# Patient Record
Sex: Male | Born: 1970 | Race: Black or African American | Hispanic: No | Marital: Single | Smoking: Never smoker
Health system: Southern US, Community
[De-identification: ages and names within clinical notes are randomized; demographics above are authoritative.]

## PROBLEM LIST (undated history)

## (undated) DIAGNOSIS — I10 Essential (primary) hypertension: Secondary | ICD-10-CM

## (undated) DIAGNOSIS — F32A Depression, unspecified: Secondary | ICD-10-CM

## (undated) DIAGNOSIS — K8020 Calculus of gallbladder without cholecystitis without obstruction: Secondary | ICD-10-CM

## (undated) DIAGNOSIS — G4730 Sleep apnea, unspecified: Secondary | ICD-10-CM

## (undated) DIAGNOSIS — M1990 Unspecified osteoarthritis, unspecified site: Secondary | ICD-10-CM

## (undated) DIAGNOSIS — F329 Major depressive disorder, single episode, unspecified: Secondary | ICD-10-CM

## (undated) DIAGNOSIS — E119 Type 2 diabetes mellitus without complications: Secondary | ICD-10-CM

## (undated) DIAGNOSIS — I82409 Acute embolism and thrombosis of unspecified deep veins of unspecified lower extremity: Secondary | ICD-10-CM

## (undated) DIAGNOSIS — K219 Gastro-esophageal reflux disease without esophagitis: Secondary | ICD-10-CM

## (undated) DIAGNOSIS — D571 Sickle-cell disease without crisis: Secondary | ICD-10-CM

## (undated) HISTORY — PX: LIGAMENT REPAIR: SHX5444

---

## 1999-11-27 ENCOUNTER — Emergency Department (HOSPITAL_COMMUNITY): Admission: EM | Admit: 1999-11-27 | Discharge: 1999-11-27 | Payer: Self-pay | Admitting: Emergency Medicine

## 2001-02-07 ENCOUNTER — Emergency Department (HOSPITAL_COMMUNITY): Admission: EM | Admit: 2001-02-07 | Discharge: 2001-02-07 | Payer: Self-pay | Admitting: Emergency Medicine

## 2001-02-15 ENCOUNTER — Emergency Department (HOSPITAL_COMMUNITY): Admission: EM | Admit: 2001-02-15 | Discharge: 2001-02-15 | Payer: Self-pay | Admitting: Emergency Medicine

## 2001-09-06 ENCOUNTER — Emergency Department (HOSPITAL_COMMUNITY): Admission: EM | Admit: 2001-09-06 | Discharge: 2001-09-06 | Payer: Self-pay | Admitting: Emergency Medicine

## 2003-04-02 ENCOUNTER — Emergency Department (HOSPITAL_COMMUNITY): Admission: EM | Admit: 2003-04-02 | Discharge: 2003-04-02 | Payer: Self-pay | Admitting: Emergency Medicine

## 2004-10-10 ENCOUNTER — Emergency Department (HOSPITAL_COMMUNITY): Admission: EM | Admit: 2004-10-10 | Discharge: 2004-10-10 | Payer: Self-pay | Admitting: Emergency Medicine

## 2006-01-29 ENCOUNTER — Ambulatory Visit: Payer: Self-pay | Admitting: *Deleted

## 2006-01-29 ENCOUNTER — Ambulatory Visit: Payer: Self-pay | Admitting: Family Medicine

## 2006-09-02 ENCOUNTER — Emergency Department (HOSPITAL_COMMUNITY): Admission: EM | Admit: 2006-09-02 | Discharge: 2006-09-02 | Payer: Self-pay | Admitting: Emergency Medicine

## 2007-01-07 ENCOUNTER — Emergency Department (HOSPITAL_COMMUNITY): Admission: EM | Admit: 2007-01-07 | Discharge: 2007-01-07 | Payer: Self-pay | Admitting: Emergency Medicine

## 2007-01-13 ENCOUNTER — Ambulatory Visit (HOSPITAL_COMMUNITY): Admission: RE | Admit: 2007-01-13 | Discharge: 2007-01-13 | Payer: Self-pay | Admitting: Orthopedic Surgery

## 2007-11-22 ENCOUNTER — Emergency Department (HOSPITAL_COMMUNITY): Admission: EM | Admit: 2007-11-22 | Discharge: 2007-11-22 | Payer: Self-pay | Admitting: Emergency Medicine

## 2009-07-05 ENCOUNTER — Emergency Department (HOSPITAL_COMMUNITY): Admission: EM | Admit: 2009-07-05 | Discharge: 2009-07-05 | Payer: Self-pay | Admitting: Family Medicine

## 2009-09-23 ENCOUNTER — Emergency Department (HOSPITAL_COMMUNITY): Admission: EM | Admit: 2009-09-23 | Discharge: 2009-09-23 | Payer: Self-pay | Admitting: Family Medicine

## 2010-04-10 ENCOUNTER — Encounter: Payer: Self-pay | Admitting: *Deleted

## 2010-05-18 ENCOUNTER — Emergency Department (HOSPITAL_COMMUNITY): Admission: EM | Admit: 2010-05-18 | Discharge: 2010-05-18 | Payer: Self-pay | Admitting: Family Medicine

## 2010-10-12 ENCOUNTER — Ambulatory Visit
Admission: RE | Admit: 2010-10-12 | Discharge: 2010-10-12 | Payer: Self-pay | Source: Home / Self Care | Attending: Family Medicine | Admitting: Family Medicine

## 2010-10-12 ENCOUNTER — Other Ambulatory Visit: Payer: Self-pay

## 2010-10-12 ENCOUNTER — Encounter: Payer: Self-pay | Admitting: Family Medicine

## 2010-10-12 DIAGNOSIS — I10 Essential (primary) hypertension: Secondary | ICD-10-CM | POA: Insufficient documentation

## 2010-10-12 DIAGNOSIS — R000 Tachycardia, unspecified: Secondary | ICD-10-CM | POA: Insufficient documentation

## 2010-10-12 LAB — CONVERTED CEMR LAB
BUN: 13 mg/dL (ref 6–23)
CO2: 27 meq/L (ref 19–32)
Chloride: 101 meq/L (ref 96–112)
Creatinine, Ser: 1.05 mg/dL (ref 0.40–1.50)
Glucose, Bld: 91 mg/dL (ref 70–99)

## 2010-10-17 NOTE — Miscellaneous (Signed)
Summary: Do Not Reschedule  Pt missed NP appt.  Per [facility] policy is not allowed to reschedule.  Dennison Nancy RN  April 10, 2010 4:34 PM

## 2010-10-19 ENCOUNTER — Encounter: Payer: Self-pay | Admitting: Family Medicine

## 2010-10-19 ENCOUNTER — Ambulatory Visit (INDEPENDENT_AMBULATORY_CARE_PROVIDER_SITE_OTHER): Payer: Medicaid Other | Admitting: Family Medicine

## 2010-10-19 DIAGNOSIS — R030 Elevated blood-pressure reading, without diagnosis of hypertension: Secondary | ICD-10-CM

## 2010-10-19 DIAGNOSIS — R000 Tachycardia, unspecified: Secondary | ICD-10-CM

## 2010-10-19 DIAGNOSIS — R609 Edema, unspecified: Secondary | ICD-10-CM

## 2010-10-19 NOTE — Assessment & Plan Note (Signed)
Summary: new pt/elevated bp/wife is a pt/bmc   Vital Signs:  Patient profile:   40 year old male Height:      69 inches Weight:      254.9 pounds BMI:     37.78 Temp:     99.0 degrees F oral Pulse rate:   84 / minute BP sitting:   144 / 88  (left arm) Cuff size:   large  Vitals Entered By: David Dunlap (October 12, 2010 9:17 AM)  CC: New Patient  Is Patient Diabetic? No Pain Assessment Patient in pain? no        CC:  New Patient .  History of Present Illness: Pt concerns:  1. Elevated pulse: x 2 weeks. Pt told pulse was elevated at a visit to donate plasma. Was not told that BP was high in the past. Denies palpations, CP, SOB, visual changes. Gained 10 lbs over last 7 mos. Denies urinary changes. Occasional swelling of lower extremities, b/l, one day r side, o ther day L side. Swelling on and off for years.   2. ? Diabetes- Denies polyuria, denies increased thirst. Denies consistently increased fatigue. Denies excessive sweating. 10 lbs weight gain over the last 7 mos.   3. R ankle knot- present for 6 mos. Trauma to R ankle in 1994. Told he has a hairline. Every now and then R ankle swells and is painful. When this happens he soaks it in warm water and props it up. Swelling and pain last for 1 week. No identified inciting factors.     Preventive Screening-Counseling & Management  Alcohol-Tobacco     Smoking Status: quit  Past History:  Past Medical History: None No hospitalizations.   Past Surgical History: R arm surgery-ligament repair. Following accident with washing machine- 2008.  Family History: Family History Diabetes 1st degree relative- mother, father (resolved).  Family History Hypertension-mother, sister   denies fam hx of cancers/stroke  Social History: Graduated from high school. [employer] for [employer].  Occupation: unemployed x 2 weeks. 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  Prev smoker-  quit  in 1988. (smoked for oone year, 1 pack/year). ETOH: none.  IDU: denies present/past.  Exercise: walks daily to run errands.  2 dogs, 3 cats.   Religion: attends church.  Former Smoker Smoking Status:  quit Occupation:  employed  Review of Systems       The patient complains of headaches.  The patient denies anorexia, fever, vision loss, decreased hearing, hoarseness, chest pain, syncope, dyspnea on exertion, prolonged cough, abdominal pain, melena, hematochezia, severe indigestion/heartburn, hematuria, muscle weakness, suspicious skin lesions, transient blindness, difficulty walking, depression, and unusual weight change.    Physical Exam  General:  Well-developed,well-nourished,in no acute distress; alert,appropriate and cooperative throughout examination Eyes:  No corneal or conjunctival inflammation noted. EOMI. Perrla. Vision grossly normal. Mouth:  pharynx pink and moist and fair dentition.   Neck:  supple, full ROM, and no masses.   Lungs:  normal respiratory effort and normal breath sounds.   Heart:  normal rate, regular rhythm, and no murmur.   Abdomen:  soft, non-tender, no distention, no masses, and no guarding.   Rectal:  no external abnormalities, no hemorrhoids, normal sphincter tone, and no masses.   Prostate:  no gland enlargement, no nodules, and no asymmetry.   Msk:  normal ROM, no joint tenderness, no joint swelling, and no joint warmth.   Extremities:  No edema Neurologic:  No  cranial nerve deficits noted. Station and gait are normal. Sensory, motor and coordinative functions appear intact. Skin:  Well-healed scar R forearm.  Additional Exam:  EKG- Sinus rhythm, short PR interval, LVH, widened QRS, pulse 84.     Impression & Recommendations:  Problem # 1:  ELEVATED BLOOD PRESSURE WITHOUT DIAGNOSIS OF HYPERTENSION (ICD-796.2) A: BP initially elevated. Normal when rechecked. No history of elevated BPs. No syncope, CP, SOB, peripheral  edema.  P: Recheck BP in 1-2 weeks. Baseline Cr to assess renal function.  Orders: EKG- [facility] (EKG) Basic Met-[facility] (40981-19147)  Problem # 2:  Hx of UNSPECIFIED TACHYCARDIA (ICD-785.0) Pulse WNL now. TSH to assess for 2/2 cause of elevated pulse.   Orders: EKG- [facility] (EKG) TSH-[facility] ([id number])  Problem # 3:  FAMILY HISTORY DIABETES 1ST DEGREE RELATIVE (ICD-V18.0) ROS negative. Pt reports 10 lbs weight gain over last 7 mos since his wife got pregnant. Advised weight loss. Will check BMET for random blood sugar.   Other Orders: Flu Vaccine 73yrs + (65784) Admin 1st Vaccine (69629)  Patient Instructions: 1)  David Dunlap, 2)  Thank you for coming in today. 3)  It was a pleasure meeting you.  4)  Given your family history it is important for you to have a primary doctor.  5)  Regarding high pulse/BP, your pulse is normal today. BP high, then normal. I will get a baseline EKG and baseline labs.  6)  Please return in 1 week for repeat BP check (nurse visit). 7)  Screenings- at age 51 we will screen for prostate cancer (we can discuss PSA vs. repeat rectal exam) and we will screen for colon cancer with colonoscopy vs. stool blood studies.  8)  f/u  9)  - with me in 1-2 weeks-recheck BP and pulse and review labs.  10)  -Dr. Armen Dunlap   Orders Added: 1)  EKG- [facility] [EKG] 2)  Basic Met-[facility] [52841-32440] 3)  TSH-[facility] [10272-53664] 4)  Flu Vaccine 24yrs + [90658] 5)  Admin 1st Vaccine [90471] 6)  [facility]- New Level 4 [99204]   Immunizations Administered:  Influenza Vaccine # 1:    Vaccine Type: Fluvax 3+    Site: right deltoid    Mfr: GlaxoSmithKline    Dose: 0.5 ml    Route: IM    Given by: David Dunlap    Exp. Date: 03/17/2011    Lot #: [id number]    VIS given: 04/11/10 version given October 12, 2010.  Flu Vaccine Consent Questions:    Do you have a history of severe allergic reactions to this vaccine? no    Any prior history of allergic reactions to egg and/or  gelatin? no    Do you have a sensitivity to the preservative Thimersol? no    Do you have a past history of Guillan-Barre Syndrome? no    Do you currently have an acute febrile illness? no    Have you ever had a severe reaction to latex? no    Vaccine information given and explained to patient? yes   Immunizations Administered:  Influenza Vaccine # 1:    Vaccine Type: Fluvax 3+    Site: right deltoid    Mfr: GlaxoSmithKline    Dose: 0.5 ml    Route: IM    Given by: David Dunlap    Exp. Date: 03/17/2011    Lot #: [id number]    VIS given: 04/11/10 version given October 12, 2010.

## 2010-10-25 NOTE — Letter (Signed)
Summary: Generic Letter  [facility]  [address]   [place], [place] [address]   Phone: [phone]  Fax: [phone]    10/19/2010  To whom it may concern at biolife plama  The below mentioned patient:  David Dunlap [address] [place] [address] [place], [place]  [address]  [place]   Has been medically cleared to donate plasma. His BP is normal as well as his pulse. His work-up for essential HTN is also normal.   Sincerely,   Dessa Phi MD

## 2010-10-25 NOTE — Letter (Signed)
Summary: Generic Letter  [facility]  [address]   [place], [place] [address]   Phone: [phone]  Fax: [phone]    10/19/2010  TAIM WURM [address] [place] [address] [place], [place]  [address]  [place]  Dear Mr. Daye,           Sincerely,   Dessa Phi MD

## 2010-10-25 NOTE — Assessment & Plan Note (Signed)
Summary: F/U  David Dunlap   Vital Signs:  Patient profile:   40 year old male Height:      69 inches Weight:      256.3 pounds BMI:     37.99 Temp:     99.0 degrees F oral Pulse rate:   99 / minute BP sitting:   125 / 80  (left arm) Cuff size:   large  Vitals Entered By: Garen Grams LPN (October 19, 2010 2:02 PM) CC: f/u bp Is Patient Diabetic? No Pain Assessment Patient in pain? no        CC:  f/u bp.  History of Present Illness: Pt here accompanied by wife ([redacted] weeks pregnant, HR OB) to f/u recent elevation in BP and HR.  1. High BPs- normal BP today. Labs WNL. Pt denies CP, HA, SOB. Needed to be medically cleared to sell plasma at [facility] on [address].   2. R Ankle pain/swelling chronic- pt states that his ankle became swollen after our last visit. Mildly painful. Denies recent trauma. Had an accident on the job in 1992/1993. Since then his ankles will sometimes swell and become tender. Gait is normal. Pain is tolerable. Pt has not tried anything for pain relief.   Preventive Screening-Counseling & Management  Alcohol-Tobacco     Smoking Status: quit  Social History: Graduated from high school. [employer] for [employer].  Occupation: unemployed x 2 weeks. Previously Pharmacist, community). Married. Wife pregant and due in early May 2012.  5 children, one on the way- all boys.  Prev smoker- quit  in 1988. (smoked for oone year, 1 pack/year). ETOH: none.  IDU: denies present/past.  Exercise: walks daily to run errands.  2 dogs, 3 cats.   Religion: attends church.  Former Smoker  Review of Systems       As per HPI  Physical Exam  General:  Well-developed,well-nourished,in no acute distress; alert,appropriate and cooperative throughout examination Msk:  R ankle swelling, medial malleolus, stiff. Mildly tender. no joint warmth, no joint deformities, no joint instability, and no crepitation.  no joint warmth, no joint deformities, no joint instability, and  no crepitation.     Impression & Recommendations:  Problem # 1:  ELEVATED BLOOD PRESSURE WITHOUT DIAGNOSIS OF HYPERTENSION (ICD-796.2)  Normal BP today. Reviewed labs (Cr and TSH) normal.  HTN resolved: Will clear pt to sell plasma.   Orders: [facility]- Est Level  3 ([address])  Problem # 2:  Hx of UNSPECIFIED TACHYCARDIA (ICD-785.0)  Pulse high normal, Normal TSH.  Orders: [facility]- Est Level  3 ([address])  Problem # 3:  ANKLE EDEMA (ICD-782.3)  R ankle swelling, Midly tender Possible arthritic changes following injury. Advise pt to schedule Acetominophen or Ibuprofen esp when he plans to be active. Will obtain records from urgent- ankle last imaged 7 mos ago. Consider re-imaging and joint injection.   Orders: [facility]- Est Level  3 ([address])   Orders Added: 1)  [facility]- Est Level  3 [[address]]

## 2010-11-22 ENCOUNTER — Ambulatory Visit (INDEPENDENT_AMBULATORY_CARE_PROVIDER_SITE_OTHER): Payer: Medicaid Other | Admitting: Family Medicine

## 2010-11-22 ENCOUNTER — Encounter: Payer: Self-pay | Admitting: Family Medicine

## 2010-11-22 DIAGNOSIS — K5900 Constipation, unspecified: Secondary | ICD-10-CM | POA: Insufficient documentation

## 2010-11-22 MED ORDER — POLYETHYLENE GLYCOL 3350 17 GM/SCOOP PO POWD: 17.0000 g | Freq: Every day | ORAL | Status: AC

## 2010-11-22 NOTE — Patient Instructions (Addendum)
It looks like some of the mucosa is irritated but there are not obvious hemorrhoids internally or externally.  I suggest finding a good bowel regimen to have one soft stool daily.  Eat lost of fruits and veggies in order to get lots of fiber in your diet.  Also, try using 1 capful of Miralax a day for soft stools.

## 2010-11-22 NOTE — Assessment & Plan Note (Signed)
Assess for hemorrhoids but non visualized. Suspect the blood on the toilet was caused by mucosal irritation from constipation. Plan to treat with Miralax to soften stool. Pt can return if having further symptoms of bleeding.

## 2010-11-22 NOTE — Progress Notes (Signed)
  Subjective:    Patient ID: David Dunlap, male    DOB: [birth year], 40 y.o.   MRN: [id number]  HPI Pt comes in with blood in the stool for the last 2 days. He says he is constipated sometimes (not always, not even once a week) but has been straining recently. He had a prostate exam some time in Jan and noticed blood in his stool a few days after that as well. Otherwise, he has not noticed blood in his stool. It is blood only on the tissue when he wipes. He does not have blood on the stool or in the bowl of water. He has no pain. He has not felt any bulging. He tries not to sit a long time when he stools but mentions that he does occasionally have to strain.    Review of Systems Neg except as noted in HPI    Objective:   Physical Exam  Constitutional: He appears well-developed and well-nourished.  Genitourinary: Rectum normal.       Exam with anoscope done. No signs of hemorroids either external nor internal. Mucosa appears red/purple but no bleeding noted.           Assessment & Plan:

## 2010-11-30 LAB — POCT I-STAT, CHEM 8
Calcium, Ion: 1.19 mmol/L (ref 1.12–1.32)
Creatinine, Ser: 1.2 mg/dL (ref 0.4–1.5)
Glucose, Bld: 87 mg/dL (ref 70–99)
HCT: 49 % (ref 39.0–52.0)
Hemoglobin: 16.7 g/dL (ref 13.0–17.0)
Potassium: 4.2 mEq/L (ref 3.5–5.1)
Sodium: 138 mEq/L (ref 135–145)
TCO2: 29 mmol/L (ref 0–100)

## 2010-12-21 LAB — COMPREHENSIVE METABOLIC PANEL
BUN: 13 mg/dL (ref 6–23)
CO2: 27 mEq/L (ref 19–32)
Calcium: 9.2 mg/dL (ref 8.4–10.5)
GFR calc non Af Amer: >60 mL/min (ref >60)
Glucose, Bld: 114 mg/dL — ABNORMAL HIGH (ref 70–99)
Total Protein: 6.9 g/dL (ref 6.0–8.3)

## 2011-02-02 NOTE — Op Note (Signed)
David Dunlap, David Dunlap            ACCOUNT NO.:  [id number]   MEDICAL RECORD NO.:  [id number]          PATIENT TYPE:  AMB   LOCATION:  SDS                          FACILITY:  [facility]   PHYSICIAN:  Nadara Mustard, MD     DATE OF BIRTH:  [birth year]   DATE OF PROCEDURE:  01/13/2007  DATE OF DISCHARGE:                               OPERATIVE REPORT   PREOPERATIVE DIAGNOSIS:  Ulnar nerve laceration, right wrist.   POSTOPERATIVE DIAGNOSES:  1. Ulnar nerve laceration, right wrist.  2. Laceration flexor carpi ulnaris.  3. Laceration of flexor digitorum superficialis, right little finger.   PROCEDURE:  1. Repair ulnar nerve.  2. Repair flexor carpi ulnaris.  3. Repair flexor digitorum superficialis to the little finger.   SURGEON:  Nadara Mustard, MD   ANESTHESIA:  General plus infraclavicular block.   SPECIMENS:  None.   DRAINS:  None.   COMPLICATIONS:  None.   ANTIBIOTICS:  1 gram of Kefzol.   TOURNIQUET TIME:  Esmarch at the forearm for approximately 33 minutes.   DISPOSITION:  To PACU in stable condition.   INDICATIONS FOR PROCEDURE:  The patient is a 40 year old gentleman who  fell over a coffee table sustaining a laceration to the ulnar nerve and  ulnar artery.  The patient was seen in the office.  The wound was  irrigated cleansed.  The ulnar nerve was tagged and the patient presents  at this time for definitive surgical intervention.  Risks and benefits  of surgery were discussed including infection, neurovascular injury,  failure of the nerve repair, potential for additional injury at the  wrist, need for additional surgery.  The patient states he understands  and wished proceed at this time.   DESCRIPTION OF PROCEDURE:  The patient was brought to OR room one after  undergoing an infraclavicular block.  The patient underwent a test and  he still had some sensation and then he underwent general, anesthetic.  After adequate level of anesthesia obtained the patient's  right upper  extremity was prepped using DuraPrep and draped into a sterile field.  The traumatic laceration was extended proximally.  The ulnar nerve  laceration was identified and the ulnar nerve edges were freshened and  this was repaired using a 6-0 nylon suture interrupted.  With further  dissection was identified the patient's FCU was completely cut and this  was repaired with 2-0 FiberWire using a Kessler suture technique with  four strands across the mid substance of the tendon for a mid substance  repair.  Further examination showed a laceration of the flexor digitorum  superficialis to the little finger.  This was also repaired with 2-0  FiberWire.  With the wrist flexed, there was no tension on the repairs.  The wound was then irrigated cleansed.  The Esmarch was released after  33 minutes.  Hemostasis was obtained.  The traumatic wound was closed  using approximate staples.  The wound was covered Adaptic orthopedic  sponges, ABDOMEN:, Webril and a posterior splint was applied with the  wrist in flexion and the fingers flexed at the MCP joint and the  fingers  extended and plaster was placed dorsally.  This was then covered with a  Coban dressing.  The patient was extubated, taken to PACU in stable  condition.  Plan for discharge to home.  Follow-up in office in 1 week  at which time we will set him up with dynamic splinting with  occupational therapy.      Nadara Mustard, MD  Electronically Signed     MVD/MEDQ  D:  01/13/2007  T:  01/14/2007  Job:  [id number]

## 2011-05-16 ENCOUNTER — Encounter: Payer: Self-pay | Admitting: Family Medicine

## 2011-08-07 ENCOUNTER — Emergency Department (HOSPITAL_COMMUNITY)
Admission: EM | Admit: 2011-08-07 | Discharge: 2011-08-07 | Disposition: A | Discharge status: Home / Self Care | Payer: Medicaid Other | Attending: Emergency Medicine | Admitting: Emergency Medicine

## 2011-08-07 ENCOUNTER — Encounter (HOSPITAL_COMMUNITY): Payer: Self-pay | Admitting: *Deleted

## 2011-08-07 DIAGNOSIS — S61509A Unspecified open wound of unspecified wrist, initial encounter: Secondary | ICD-10-CM | POA: Insufficient documentation

## 2011-08-07 DIAGNOSIS — W268XXA Contact with other sharp object(s), not elsewhere classified, initial encounter: Secondary | ICD-10-CM | POA: Insufficient documentation

## 2011-08-07 DIAGNOSIS — W01119A Fall on same level from slipping, tripping and stumbling with subsequent striking against unspecified sharp object, initial encounter: Secondary | ICD-10-CM | POA: Insufficient documentation

## 2011-08-07 DIAGNOSIS — S61519A Laceration without foreign body of unspecified wrist, initial encounter: Secondary | ICD-10-CM

## 2011-08-07 DIAGNOSIS — Y9289 Other specified places as the place of occurrence of the external cause: Secondary | ICD-10-CM | POA: Insufficient documentation

## 2011-08-07 DIAGNOSIS — M25539 Pain in unspecified wrist: Secondary | ICD-10-CM | POA: Insufficient documentation

## 2011-08-07 MED ORDER — TETANUS-DIPHTH-ACELL PERTUSSIS 5-2.5-18.5 LF-MCG/0.5 IM SUSP
0.5000 mL | Freq: Once | INTRAMUSCULAR | Status: AC
  Administered 2011-08-07: 0.5 mL via INTRAMUSCULAR

## 2011-08-07 MED ORDER — TETANUS-DIPHTH-ACELL PERTUSSIS 5-2.5-18.5 LF-MCG/0.5 IM SUSP
INTRAMUSCULAR | Status: AC
  Filled 2011-08-07: qty 0.5

## 2011-08-07 MED ORDER — TRAMADOL HCL 50 MG PO TABS: 50.0000 mg | ORAL_TABLET | Freq: Four times a day (QID) | ORAL | Status: AC | PRN

## 2011-08-07 NOTE — ED Notes (Signed)
Pt with full rom to L hand.  Ulnar and radial pulses present.

## 2011-08-07 NOTE — ED Provider Notes (Signed)
History     CSN: [id number] Arrival date & time: 08/07/2011  6:19 AM   First MD Initiated Contact with Patient 08/07/11 [phone]      Chief Complaint  Patient presents with  . Extremity Laceration    (Consider location/radiation/quality/duration/timing/severity/associated sxs/prior treatment) The history is provided by the patient.   David Dunlap is a 40 year old male with no significant past medical history who presents with complaint of a laceration to the anterior left wrist. Patient has RHD. He reports that after watching a football game with friends last night he tripped on the way out the door and his wrist was cut by a piece of broken glass on the floor. He complains of aching, moderately intense, constant pain at the site of the laceration. There is no associated bleeding, numbness, tingling, weakness, or known foreign body in the wound. There was no prior treatment.  History reviewed. No pertinent past medical history.  Past Surgical History  Procedure Date  . Ligament repair     R forearm    No family history on file.  History  Substance Use Topics  . Smoking status: Never Smoker   . Smokeless tobacco: Not on file  . Alcohol Use: No      Review of Systems  Skin: Positive for wound.  Neurological: Negative for weakness, light-headedness and numbness.  Hematological: Does not bruise/bleed easily.  All other systems reviewed and are negative.    Allergies  Review of patient's allergies indicates no known allergies.  Home Medications  No current outpatient prescriptions on file.  BP 121/83  Pulse 96  Temp(Src) 98.4 F (36.9 C) (Oral)  Resp 16  SpO2 97%  Physical Exam  Constitutional: He is oriented to person, place, and time. He appears well-developed and well-nourished. No distress.  HENT:  Head: Normocephalic and atraumatic.  Eyes: Pupils are equal, round, and reactive to light.  Neck: Normal range of motion. Neck supple.  Cardiovascular: Normal  rate and regular rhythm.   Pulmonary/Chest: Effort normal and breath sounds normal.  Abdominal: Soft. He exhibits no distension. There is no tenderness.  Musculoskeletal: Normal range of motion. He exhibits no edema.       Tenderness of the anterior aspect of the left wrist. Please see skin exam. Strength is tested in flexion and extension of all digits as well as flexion, extension, abduction, and abduction of the wrist and all are 5 out of 5 bilaterally. Capillary refill is less than 2 seconds.  Neurological: He is alert and oriented to person, place, and time. He has normal strength. No cranial nerve deficit or sensory deficit.  Skin: Skin is warm and dry. Laceration noted.       The patient has a 3 cm superficial linear laceration to the anterior left wrist. There is no damage to underlying structures. There is no active bleeding. There is no surrounding erythema.    ED Course  Procedures (including critical care time)  LACERATION REPAIR Performed by: Lorenz Coaster Consent: Verbal consent obtained. Risks and benefits: risks, benefits and alternatives were discussed Patient identity confirmed: provided demographic data Time out performed prior to procedure Prepped and Draped in normal sterile fashion Wound explored  Laceration Location: Anterior left wrist  Laceration Length: 3 cm  No Foreign Bodies seen or palpated  Anesthesia: local infiltration  Local anesthetic: None    Irrigation method: Skin scrub with Betadine followed by scrub with wound cleansing agent  Amount of cleaning: standard  Skin closure: 3 Steri-Strips were placed across  the wound; Dermabond was then placed over the entire length of the wound   Number of sutures or staples:N/A   Patient tolerance: Patient tolerated the procedure well with no immediate complications.    No diagnosis found.    MDM  Superficial laceration with no active bleeding. Wound closed with Dermabond and Steri-Strips. The  patient requests pain medication for home use. His tetanus was updated today.        [address] [place], [place] 08/07/11 [phone]

## 2011-08-07 NOTE — ED Provider Notes (Signed)
Medical screening examination/treatment/procedure(s) were conducted as a shared visit with non-physician practitioner(s) and myself.  I personally evaluated the patient during the encounter  Shallow laceration left lower wrist without involvement of deep structures.   Hanley Seamen, MD 08/07/11 [phone]

## 2011-08-07 NOTE — ED Notes (Signed)
Pt tripped on his pants and fell on the ground, hitting his L wrist against a piece of glass sticking out of the ground around 12:30 last night.  1.5 inch lac to L ant wrist.  No active bleeding.

## 2011-08-22 ENCOUNTER — Ambulatory Visit (INDEPENDENT_AMBULATORY_CARE_PROVIDER_SITE_OTHER): Payer: Medicaid Other | Admitting: Family Medicine

## 2011-08-22 ENCOUNTER — Encounter: Payer: Self-pay | Admitting: Family Medicine

## 2011-08-22 VITALS — BP 121/80 | HR 69 | Temp 98.0°F | Ht 69.0 in | Wt 244.2 lb

## 2011-08-22 DIAGNOSIS — F321 Major depressive disorder, single episode, moderate: Secondary | ICD-10-CM

## 2011-08-22 DIAGNOSIS — R030 Elevated blood-pressure reading, without diagnosis of hypertension: Secondary | ICD-10-CM

## 2011-08-22 LAB — CBC
HCT: 45.7 % (ref 39.0–52.0)
Hemoglobin: 16.5 g/dL (ref 13.0–17.0)
MCH: 28.9 pg (ref 26.0–34.0)
MCHC: 36.1 g/dL — ABNORMAL HIGH (ref 30.0–36.0)
RDW: 14 % (ref 11.5–15.5)

## 2011-08-22 LAB — COMPREHENSIVE METABOLIC PANEL
ALT: 28 U/L (ref 0–53)
Albumin: 4.4 g/dL (ref 3.5–5.2)
Alkaline Phosphatase: 58 U/L (ref 39–117)
CO2: 26 mEq/L (ref 19–32)
Glucose, Bld: 103 mg/dL — ABNORMAL HIGH (ref 70–99)
Potassium: 4.6 mEq/L (ref 3.5–5.3)
Sodium: 138 mEq/L (ref 135–145)
Total Protein: 6.9 g/dL (ref 6.0–8.3)

## 2011-08-22 LAB — T3, FREE: T3, Free: 3.9 pg/mL (ref 2.3–4.2)

## 2011-08-22 LAB — TSH: TSH: 0.534 u[IU]/mL (ref 0.350–4.500)

## 2011-08-22 MED ORDER — BUPROPION HCL 100 MG PO TABS: 100.0000 mg | ORAL_TABLET | Freq: Two times a day (BID) | ORAL | Status: DC

## 2011-08-22 MED ORDER — TADALAFIL 10 MG PO TABS: 10.0000 mg | ORAL_TABLET | ORAL | Status: DC | PRN

## 2011-08-22 NOTE — Assessment & Plan Note (Signed)
A: BP normal this visit.  P: monitor q visit.

## 2011-08-22 NOTE — Patient Instructions (Addendum)
[name], Thank you for coming in today.  Please begin the Wellbutrin, watch out for worsening suicidal thoughts, if this happens please stop the medication and call the clinic.  You may take the Cialis, but keep in mind we have to treat your overall mood and health before you will see much benefit.  Begin a regular exercise program out of the house (walking/jogging/cross training in the park). Keep track of how you feel when you do this and let me know when we see each other again.    Please call this # at [facility] the [place] for Counseling services: Individual & Family Counseling [phone]   F/u with me in 2 weeks.   -Dr. Armen Pickup

## 2011-08-22 NOTE — Progress Notes (Signed)
  Subjective:    Patient ID: David Dunlap, male    DOB: December 08, 1970, 40 y.o.   MRN: [id number]  HPI 40 year old male comes in for evaluation of possible depression. The patient reports symptoms for the last 2 years and became significantly worse 2 months ago. He has no history of depression or other mood disorder. Family history of bipolar in his sister. No mood disorder in his parents. He describes depression since January 2011 when he loss his job. It became worse over the last 2 months associated with the death of his grandmother, and infidelity in his wife that occurred 2 weeks ago. He denies substance abuse, he takes no medications chronically.   PHQ-9 score 13. The patient checked very difficult in response to question regarding functional disability.  The patient responded nearly every day to question to in 6. He responded several days for the questions except question 5.   Homicidal/Suicidal Thoughts:  In the last 2 weeks the patient has been in prison for 48 hours and placed on suicide watch for threatening his wife.While in prison he had episode of feeling the room spinning. Prior to going to prison he did have an episode of fainting at his parents house. He denies any his wife. He states that he would not hurt his wife his children, or anyone else.  6 days ago he had a suicidal gesture where he cut his left with wrist with a box cutter. The next day presented to the ED for evaluation of cough and the wound did not require suturing.  Is not sure if he would try this again. He reports that he wants to live for his family and to see his children grow up.  Psychotic Features:  He admits to hearing the voice of his deceased grandmother and her funeral. He otherwise denies auditory or visual hallucinations.  Sleep: He states that his sleep is erratic. He will sometimes will stay up late about 2 or 3 in the morning and sleeps only 3-4 hours a night. Other nights he'll sleep for 6-8 hours.     Anhedonia:  Patient reports is very little he enjoys. He says most of his day at home with his wife and  supposed son. She denies exercise regularly. He states that his sexual performance is poor. He asked for medication to improve sexual performance.   Review of Systems  as per history of present illness     Objective:   Physical Exam General appearance: alert, cooperative, no distress and mildly obese Neurologic: Grossly normal Psych: Affect is slightly depressed. Patient answers questions appropriately. His speech, behavior, thought content, cognition, memory and judgement are all normal.  SKIN: Healing, shallow lacerations on L wrist.     Assessment & Plan:

## 2011-08-22 NOTE — Assessment & Plan Note (Addendum)
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 We discussed causes of depressed mood. Patient has good insight into his situation. He is interested in iniating pharmocotherpay as well as CBT. P: Wellbutrin 100 mg BID to increase to TID in 3 days initiated after discussing risk and benefits and answering all patient questions about the medication.  -referral to [facility], also gave pt list of therapist/psychiatrist in area for self referral.  -f/u in 2 weeks.

## 2011-08-27 ENCOUNTER — Telehealth: Payer: Self-pay | Admitting: Family Medicine

## 2011-08-27 NOTE — Telephone Encounter (Signed)
A call patient at home to let him know that all his blood work was normal. Patient is compliant with Wellbutrin. Patient has followup appointment on 09/05/2011.

## 2011-09-05 ENCOUNTER — Ambulatory Visit: Payer: Medicaid Other | Admitting: Family Medicine

## 2013-03-04 ENCOUNTER — Emergency Department (HOSPITAL_COMMUNITY)
Admission: EM | Admit: 2013-03-04 | Discharge: 2013-03-05 | Disposition: A | Discharge status: Home / Self Care | Payer: Self-pay | Attending: Emergency Medicine | Admitting: Emergency Medicine

## 2013-03-04 ENCOUNTER — Encounter (HOSPITAL_COMMUNITY): Payer: Self-pay | Admitting: *Deleted

## 2013-03-04 DIAGNOSIS — K8050 Calculus of bile duct without cholangitis or cholecystitis without obstruction: Secondary | ICD-10-CM

## 2013-03-04 DIAGNOSIS — K8020 Calculus of gallbladder without cholecystitis without obstruction: Secondary | ICD-10-CM | POA: Insufficient documentation

## 2013-03-04 DIAGNOSIS — E669 Obesity, unspecified: Secondary | ICD-10-CM | POA: Insufficient documentation

## 2013-03-04 LAB — POCT I-STAT TROPONIN I: Troponin i, poc: 0 ng/mL (ref 0.00–0.08)

## 2013-03-04 NOTE — ED Notes (Signed)
The pt is c/o epigastric pain all day no sob no nv.  No previous history

## 2013-03-05 ENCOUNTER — Encounter (HOSPITAL_COMMUNITY): Payer: Self-pay | Admitting: Emergency Medicine

## 2013-03-05 ENCOUNTER — Emergency Department (HOSPITAL_COMMUNITY): Payer: Self-pay

## 2013-03-05 ENCOUNTER — Emergency Department (HOSPITAL_COMMUNITY)
Admission: EM | Admit: 2013-03-05 | Discharge: 2013-03-05 | Disposition: A | Discharge status: Home / Self Care | Payer: Self-pay | Attending: Emergency Medicine | Admitting: Emergency Medicine

## 2013-03-05 DIAGNOSIS — R0609 Other forms of dyspnea: Secondary | ICD-10-CM | POA: Insufficient documentation

## 2013-03-05 DIAGNOSIS — R42 Dizziness and giddiness: Secondary | ICD-10-CM | POA: Insufficient documentation

## 2013-03-05 DIAGNOSIS — R109 Unspecified abdominal pain: Secondary | ICD-10-CM | POA: Insufficient documentation

## 2013-03-05 DIAGNOSIS — R197 Diarrhea, unspecified: Secondary | ICD-10-CM | POA: Insufficient documentation

## 2013-03-05 DIAGNOSIS — R0989 Other specified symptoms and signs involving the circulatory and respiratory systems: Secondary | ICD-10-CM | POA: Insufficient documentation

## 2013-03-05 DIAGNOSIS — R110 Nausea: Secondary | ICD-10-CM | POA: Insufficient documentation

## 2013-03-05 DIAGNOSIS — R55 Syncope and collapse: Secondary | ICD-10-CM | POA: Insufficient documentation

## 2013-03-05 LAB — URINALYSIS, ROUTINE W REFLEX MICROSCOPIC
Glucose, UA: NEGATIVE mg/dL
Hgb urine dipstick: NEGATIVE
Ketones, ur: NEGATIVE mg/dL
Leukocytes, UA: NEGATIVE
Protein, ur: NEGATIVE mg/dL
pH: 6.5 (ref 5.0–8.0)

## 2013-03-05 LAB — CBC WITH DIFFERENTIAL/PLATELET
Basophils Relative: 0 % (ref 0–1)
Eosinophils Relative: 2 % (ref 0–5)
Hemoglobin: 15.3 g/dL (ref 13.0–17.0)
Lymphocytes Relative: 25 % (ref 12–46)
Monocytes Relative: 9 % (ref 3–12)
Neutrophils Relative %: 64 % (ref 43–77)
RBC: 5.31 MIL/uL (ref 4.22–5.81)
WBC: 12.7 10*3/uL — ABNORMAL HIGH (ref 4.0–10.5)

## 2013-03-05 LAB — BASIC METABOLIC PANEL
CO2: 22 mEq/L (ref 19–32)
Calcium: 9.3 mg/dL (ref 8.4–10.5)
Creatinine, Ser: 0.99 mg/dL (ref 0.50–1.35)
GFR calc Af Amer: >90 mL/min (ref >90)
GFR calc non Af Amer: >90 mL/min (ref >90)
Sodium: 138 mEq/L (ref 135–145)

## 2013-03-05 LAB — CBC
MCH: 29.1 pg (ref 26.0–34.0)
MCV: 80.8 fL (ref 78.0–100.0)
Platelets: 191 10*3/uL (ref 150–400)
RBC: 5.47 MIL/uL (ref 4.22–5.81)
RDW: 13.4 % (ref 11.5–15.5)

## 2013-03-05 LAB — COMPREHENSIVE METABOLIC PANEL
ALT: 36 U/L (ref 0–53)
AST: 14 U/L (ref 0–37)
Albumin: 3.6 g/dL (ref 3.5–5.2)
CO2: 25 mEq/L (ref 19–32)
Chloride: 102 mEq/L (ref 96–112)
GFR calc non Af Amer: 85 mL/min — ABNORMAL LOW (ref >90)
Sodium: 136 mEq/L (ref 135–145)
Total Bilirubin: 0.4 mg/dL (ref 0.3–1.2)

## 2013-03-05 LAB — HEPATIC FUNCTION PANEL
Albumin: 3.8 g/dL (ref 3.5–5.2)
Alkaline Phosphatase: 67 U/L (ref 39–117)
Bilirubin, Direct: <0.1 mg/dL (ref 0.0–0.3)
Total Bilirubin: 0.3 mg/dL (ref 0.3–1.2)

## 2013-03-05 LAB — POCT I-STAT TROPONIN I: Troponin i, poc: 0 ng/mL (ref 0.00–0.08)

## 2013-03-05 MED ORDER — ONDANSETRON 4 MG PO TBDP
4.0000 mg | ORAL_TABLET | Freq: Once | ORAL | Status: AC
  Administered 2013-03-05: 4 mg via ORAL
  Filled 2013-03-05: qty 1

## 2013-03-05 MED ORDER — OMEPRAZOLE 20 MG PO CPDR: 20.0000 mg | DELAYED_RELEASE_CAPSULE | Freq: Every day | ORAL | Status: DC

## 2013-03-05 MED ORDER — HYDROCODONE-ACETAMINOPHEN 5-325 MG PO TABS: 1.0000 | ORAL_TABLET | ORAL | Status: DC | PRN

## 2013-03-05 MED ORDER — HYDROMORPHONE HCL PF 1 MG/ML IJ SOLN
1.0000 mg | Freq: Once | INTRAMUSCULAR | Status: AC
  Administered 2013-03-05: 1 mg via INTRAVENOUS
  Filled 2013-03-05: qty 1

## 2013-03-05 MED ORDER — ONDANSETRON HCL 4 MG PO TABS: 4.0000 mg | ORAL_TABLET | Freq: Four times a day (QID) | ORAL | Status: DC

## 2013-03-05 MED ORDER — SODIUM CHLORIDE 0.9 % IV BOLUS (SEPSIS)
1000.0000 mL | Freq: Once | INTRAVENOUS | Status: AC
  Administered 2013-03-05: 1000 mL via INTRAVENOUS

## 2013-03-05 MED ORDER — ONDANSETRON 4 MG PO TBDP: 4.0000 mg | ORAL_TABLET | Freq: Three times a day (TID) | ORAL | Status: DC | PRN

## 2013-03-05 MED ORDER — MORPHINE SULFATE 4 MG/ML IJ SOLN
4.0000 mg | Freq: Once | INTRAMUSCULAR | Status: AC
  Administered 2013-03-05: 4 mg via INTRAVENOUS
  Filled 2013-03-05: qty 1

## 2013-03-05 NOTE — ED Provider Notes (Signed)
History     CSN: [id number]  Arrival date & time 03/05/13  1712   First MD Initiated Contact with Patient 03/05/13 1731      Chief Complaint  Patient presents with  . Chest Pain  . Cholelithiasis    (Consider location/radiation/quality/duration/timing/severity/associated sxs/prior treatment) HPI Patient presents for second time in 24 hours with concerns of chest pain, epigastric and right upper quadrant pain.  The pain is severe, sharp, otherwise nonradiating. There is no associated dyspnea, lightheadedness, syncope.  There is mild nausea.  There is associated diarrhea.  The patient was seen here yesterday, had ultrasound demonstrated cholelithiasis without cholecystitis. Patient has not taken any medications at that evaluation, states he continues to have chest pain, belly pain.     History reviewed. No pertinent past medical history.  Past Surgical History  Procedure Laterality Date  . Ligament repair      R forearm    History reviewed. No pertinent family history.  History  Substance Use Topics  . Smoking status: Never Smoker   . Smokeless tobacco: Not on file  . Alcohol Use: No      Review of Systems  Constitutional:       Per HPI, otherwise negative  HENT:       Per HPI, otherwise negative  Respiratory:       Per HPI, otherwise negative  Cardiovascular:       Per HPI, otherwise negative  Gastrointestinal: Negative for vomiting.  Endocrine:       Negative aside from HPI  Genitourinary:       Neg aside from HPI   Musculoskeletal:       Per HPI, otherwise negative  Skin: Negative.   Neurological: Negative for syncope.    Allergies  Review of patient's allergies indicates no known allergies.  Home Medications  No current outpatient prescriptions on file.  BP 117/72  Pulse 79  Temp(Src) 98.4 F (36.9 C) (Oral)  Resp 19  SpO2 98%  Physical Exam  Nursing note and vitals reviewed. Constitutional: He is oriented to person, place, and time. He  appears well-developed. No distress.  HENT:  Head: Normocephalic and atraumatic.  Eyes: Conjunctivae and EOM are normal.  Cardiovascular: Normal rate and regular rhythm.   Pulmonary/Chest: Effort normal. No stridor. No respiratory distress.  Abdominal: He exhibits no distension. There is no hepatosplenomegaly. There is tenderness in the right upper quadrant and epigastric area. There is no rigidity, no rebound and no guarding.  Musculoskeletal: He exhibits no edema.  Neurological: He is alert and oriented to person, place, and time.  Skin: Skin is warm and dry.  Psychiatric: He has a normal mood and affect.    ED Course  Procedures (including critical care time)  Labs Reviewed  CBC WITH DIFFERENTIAL  COMPREHENSIVE METABOLIC PANEL  LIPASE, BLOOD  URINALYSIS, ROUTINE W REFLEX MICROSCOPIC  POCT I-STAT TROPONIN I   Dg Chest 2 View  03/05/2013   *RADIOLOGY REPORT*  Clinical Data: Chest pain  CHEST - 2 VIEW  Comparison: None.  Findings: Cardiac leads project over the chest.  The heart, mediastinal, and hilar contours and pulmonary vascularity normal. The lungs are normally expanded and clear.  No acute osseous abnormality.  IMPRESSION: No acute cardiopulmonary disease.   Original Report Authenticated By: Britta Mccreedy, M.D.   US Abdomen Complete  03/05/2013   *RADIOLOGY REPORT*  Clinical Data:  Epigastric pain.  Leukocytosis.  COMPLETE ABDOMINAL ULTRASOUND  Comparison:  No priors.  Findings:  Gallbladder:  Several echogenic  lesions with posterior acoustic shadowing are noted in the neck of the gallbladder, measuring at least 1.6 cm in diameter, compatible with gallstones.  Gallbladder wall thickness is normal (2.5 mm).  No abnormal pericholecystic fluid.  Per report from the sonographer, the patient did not exhibit a sonographic Murphy's sign on examination, although, the sonographer reports that the patient is currently on pain medication.  Common bile duct:  Normal caliber measuring 4.2 mm in  the porta hepatis.  Liver:  Diffusely echogenic, suggestive of hepatic steatosis.  No focal cystic or solid hepatic lesions.  No intrahepatic biliary ductal dilatation.  IVC:  Patent throughout its visualized course in the abdomen.  Pancreas:  Poorly visualized secondary to overlying bowel gas.  Spleen:  Normal size and echotexture without focal parenchymal abnormality.7.9 cm in length.  Right Kidney:  No hydronephrosis.  Well-preserved cortex.  Normal size and parenchymal echotexture without focal abnormalities. 9.5 cm in length.  Left Kidney:  No hydronephrosis.  Well-preserved cortex.  Normal size and parenchymal echotexture without focal abnormalities. 10.0 cm in length.  Abdominal aorta:  Normal caliber measuring 2.5 cm in diameter proximally, tapering appropriately distally.  IMPRESSION: 1.  Study is positive for cholelithiasis, but there is no definitive evidence for acute cholecystitis at this time.  Please note, however, that per report for this sonographer, the patient is on pain medication at the time of the examination, which does limit the sensitivity and specificity of the examination (i.e., this may affect interpretation of a sonographic Murphy's sign). 2.  Hepatic steatosis.   Original Report Authenticated By: Trudie Reed, M.D.     No diagnosis found.  Cardiac 75 sinus rhythm normal Pulse ox 100% room air normal    Date: 03/05/2013  Rate: 84  Rhythm: normal sinus rhythm  QRS Axis: left  Intervals: normal  ST/T Wave abnormalities: nonspecific T wave changes  Conduction Disutrbances:nonspecific intraventricular conduction delay  Narrative Interpretation:   Old EKG Reviewed: No sig changes since yesterday ABNORMAL   I reviewed the patient's chart from yesterday.    7:25 PM Patient in no distress. Labs a similar history, including decreasing leukocytosis.  7:55 PM On repeat exam the patient is nearly asleep.  Though he continues to complain of mild discomfort in the  epigastrium, the absence of distress is reassuring.  Labs notable for no change in troponin.  Given the patient's description of pain in the epigastrium for greater than 24 hours, this likely reflected the absence of ongoing coronary ischemia. MDM  This young male presents with ongoing epigastric and chest pain.  On exam he is awake and alert, hemodynamically stable, in no distress.  With the patient's description of pain for more than 24 hours, and negative series of troponins including yesterday, nonischemic EKG is, though they are abnormal and suggests the absence of ongoing ischemia.  Patient's evaluation yesterday demonstrated elevated transaminases.  Today he he has a diminished leukocytosis, and remains afebrile.  The patient did not follow up with surgery in the interim, and was seemingly dissuaded due to the cost of the co-pay. With the absence of any ongoing distress, the patient's sleeping status, and his unremarkable labs, it seems as though acute cholecystitis is unlikely.  After lengthy discussion on the need to follow up, with both primary care and surgery, he was stable for d/c if his pending UA was unremarkable.  Case signed out to PA - and on late chart review this seems unremarkable.  Gerhard Munch, MD 03/06/13 1133

## 2013-03-05 NOTE — ED Provider Notes (Signed)
History     CSN: [id number]  Arrival date & time 03/04/13  2312   First MD Initiated Contact with Patient 03/05/13 0036      Chief Complaint  Patient presents with  . Chest Pain    (Consider location/radiation/quality/duration/timing/severity/associated sxs/prior treatment) HPI 42 yo obese man presents with 2d of intermittent midline anterior lower chest pain. Sx began spontaneously around noon yesterday. Lasted a couple of hours and resoled without intevention. However, the pain returned today around noon and has been persistent since then. Pain is aching, and 6/10 in severity and nonradiating. The patient denies nausea, vomiting, change in color or caliber of his stools. He also denies abdominal pain, shortness of breath, fever and cough.  The patient says he has no history of similar symptoms. However, his wife says that he frequently clutches his chest and pain and that this is a problem for the past month or so. The patient then stated "oh yeah I forgot that".  Patient has no history of previous abdominal surgeries.  He has no history of cardiopulmonary disease. Please that his last physical exam was about one year ago. He denies any history of diabetes, high cholesterol, hypertension, smoking history or family history of coronary artery disease.  Patient says he has never had any type of cardiac evaluation.   History reviewed. No pertinent past medical history.  Past Surgical History  Procedure Laterality Date  . Ligament repair      R forearm    No family history on file.  History  Substance Use Topics  . Smoking status: Never Smoker   . Smokeless tobacco: Not on file  . Alcohol Use: No      Review of Systems Gen: no weight loss, fevers, chills, night sweats Eyes: no discharge or drainage, no occular pain or visual changes Nose: no epistaxis or rhinorrhea Mouth: no dental pain, no sore throat Neck: no neck pain Lungs: no SOB, cough, wheezing CV: As per history  of present illness, otherwise negative Abd: no abdominal pain, nausea, vomiting GU: no dysuria or gross hematuria MSK: no myalgias or arthralgias Neuro: no headache, no focal neurologic deficits Skin: no rash Psyche: negative.  Allergies  Review of patient's allergies indicates no known allergies.  Home Medications   Current Outpatient Rx  Name  Route  Sig  Dispense  Refill  . EXPIRED: buPROPion (WELLBUTRIN) 100 MG tablet   Oral   Take 1 tablet (100 mg total) by mouth 2 (two) times daily. May increase to three times daily after three days.   30 tablet   0   . EXPIRED: tadalafil (CIALIS) 10 MG tablet   Oral   Take 1 tablet (10 mg total) by mouth as needed for erectile dysfunction.   20 tablet   1     BP 126/89  Pulse 87  Temp(Src) 97.4 F (36.3 C) (Oral)  Resp 18  SpO2 95%  Physical Exam Gen: well developed and well nourished appearing, does not appear in distress or discomfort Head: NCAT Eyes: PERL, EOMI Nose: no epistaixis or rhinorrhea Mouth/throat: mucosa is moist and pink Neck: supple, no stridor Lungs: CTA B, no wheezing, rhonchi or rales Heart: Regular rate and rhythm, no murmur, strong and equal peripheral pulses Abd: soft, obese, notender, nondistended Back: no ttp, no cva ttp Skin: no rashese, wnl Neuro: CN ii-xii grossly intact, no focal deficits Psyche; normal affect,  calm and cooperative.   ED Course  Procedures (including critical care time)  Results for orders  placed during the hospital encounter of 03/04/13 (from the past 24 hour(s))  CBC     Status: Abnormal   Collection Time    03/04/13 11:27 PM      Result Value Range   WBC 14.0 (*) 4.0 - 10.5 K/uL   RBC 5.47  4.22 - 5.81 MIL/uL   Hemoglobin 15.9  13.0 - 17.0 g/dL   HCT 16.1  09.6 - 04.5 %   MCV 80.8  78.0 - 100.0 fL   MCH 29.1  26.0 - 34.0 pg   MCHC 36.0  30.0 - 36.0 g/dL   RDW 40.9  81.1 - 91.4 %   Platelets 191  150 - 400 K/uL  BASIC METABOLIC PANEL     Status: Abnormal    Collection Time    03/04/13 11:27 PM      Result Value Range   Sodium 138  135 - 145 mEq/L   Potassium 4.1  3.5 - 5.1 mEq/L   Chloride 102  96 - 112 mEq/L   CO2 22  19 - 32 mEq/L   Glucose, Bld 144 (*) 70 - 99 mg/dL   BUN 16  6 - 23 mg/dL   Creatinine, Ser 7.82  0.50 - 1.35 mg/dL   Calcium 9.3  8.4 - 95.6 mg/dL   GFR calc non Af Amer >90  >90 mL/min   GFR calc Af Amer >90  >90 mL/min  POCT I-STAT TROPONIN I     Status: None   Collection Time    03/04/13 11:31 PM      Result Value Range   Troponin i, poc 0.00  0.00 - 0.08 ng/mL   Comment 3            EKG: nsr, no acute ischemic changes, normal intervals, normal axis, normal qrs complex. No change from Jan 2012 study.     MDM  DDX: ACS, pneumonia, pancreatitis, hepatitis, GB disease. We are managing symptomatically. CBC notable for leukocytosis with WBC 14K. EKG no change from previous. Tpn wnl. BMP wnl. LFTs, lipase and CXR are pending.    COMPLETE ABDOMINAL ULTRASOUND  Comparison: No priors.  Findings:  Gallbladder: Several echogenic lesions with posterior acoustic shadowing are noted in the neck of the gallbladder, measuring at least 1.6 cm in diameter, compatible with gallstones. Gallbladder wall thickness is normal (2.5 mm). No abnormal pericholecystic fluid. Per report from the sonographer, the patient did not exhibit a sonographic Murphy's sign on examination, although, the sonographer reports that the patient is currently on pain medication.  Common bile duct: Normal caliber measuring 4.2 mm in the porta hepatis.  Liver: Diffusely echogenic, suggestive of hepatic steatosis. No focal cystic or solid hepatic lesions. No intrahepatic biliary ductal dilatation.  IVC: Patent throughout its visualized course in the abdomen.  Pancreas: Poorly visualized secondary to overlying bowel gas.  Spleen: Normal size and echotexture without focal parenchymal abnormality.7.9 cm in length.  Right Kidney: No hydronephrosis.  Well-preserved cortex. Normal size and parenchymal echotexture without focal abnormalities. 9.5 cm in length.  Left Kidney: No hydronephrosis. Well-preserved cortex. Normal size and parenchymal echotexture without focal abnormalities. 10.0 cm in length.  Abdominal aorta: Normal caliber measuring 2.5 cm in diameter proximally, tapering appropriately distally.  IMPRESSION: 1. Study is positive for cholelithiasis, but there is no definitive evidence for acute cholecystitis at this time. Please note, however, that per report for this sonographer, the patient is on pain medication at the time of the examination, which does limit the sensitivity and specificity  of the examination (i.e., this may affect interpretation of a sonographic Murphy's sign). 2. Hepatic steatosis.   Original Report Authenticated By: Trudie Reed, M.D.    Patient with biliary colic. Afebrile. Do not suspect acute cholecystitis. Patient says he is feeling better and is asking for food. I have explained his diagnosis to him and discussed need for close outpatient follow up to be evaluated by [facility] for elective cholecystectomy. He and wife voice understanding and agreement with pain. Patient counseled to return to the ED for fever, uncontrolled pain or persistent vomiting.          Brandt Loosen, MD 03/05/13 [phone]

## 2013-03-05 NOTE — ED Notes (Signed)
Pt seen here for same last night and sts could not get meds filled; pt sts CP and was diagnosed with gall stones

## 2013-10-07 ENCOUNTER — Encounter (HOSPITAL_COMMUNITY): Payer: Self-pay | Admitting: Emergency Medicine

## 2013-10-07 ENCOUNTER — Emergency Department (HOSPITAL_COMMUNITY)
Admission: EM | Admit: 2013-10-07 | Discharge: 2013-10-07 | Disposition: A | Discharge status: Home / Self Care | Payer: Self-pay | Attending: Emergency Medicine | Admitting: Emergency Medicine

## 2013-10-07 DIAGNOSIS — M722 Plantar fascial fibromatosis: Secondary | ICD-10-CM | POA: Insufficient documentation

## 2013-10-07 MED ORDER — NAPROXEN 500 MG PO TABS: 500.0000 mg | ORAL_TABLET | Freq: Two times a day (BID) | ORAL | Status: DC

## 2013-10-07 MED ORDER — TRAMADOL HCL 50 MG PO TABS: 50.0000 mg | ORAL_TABLET | Freq: Four times a day (QID) | ORAL | Status: DC | PRN

## 2013-10-07 NOTE — ED Provider Notes (Signed)
Medical screening examination/treatment/procedure(s) were performed by non-physician practitioner and as supervising physician I was immediately available for consultation/collaboration.  EKG Interpretation   None         Charles B. Sheldon, MD 10/07/13 2053 

## 2013-10-07 NOTE — ED Notes (Signed)
Pt states he has been having R foot pain.  Pt states it "feels like I have a knot on my foot by the arch".   Pt states pain makes it difficult to walk.

## 2013-10-07 NOTE — ED Provider Notes (Signed)
CSN: [id number]     Arrival date & time 10/07/13  1348 History  This chart was scribed for non-physician practitioner Jaynie Crumbleatyana Laderius Valbuena, PA-C working with Leonette Mostharles B. Bernette MayersSheldon, MD by Dorothey Basemania Sutton, ED Scribe. This patient was seen in room TR07C/TR07C and the patient's care was started at 3:19 PM.    Chief Complaint  Patient presents with  . Foot Pain   The history is provided by the patient. No language interpreter was used.   HPI Comments: David Dunlap is a 43 y.o. male who presents to the Emergency Department complaining of a constant pain to the bottom of the right foot, described as feeling like a "knot" in the arch of the foot, that radiates into the knee with some associated swelling around the ankle onset a few days ago. Patient states that the pain is exacerbated with walking. He denies any potential injury or trauma to the area. He states that he has experienced similar symptoms in the past, which resolved on its own. He denies trying any treatments at home. Patient has no other pertinent medical history.   History reviewed. No pertinent past medical history. Past Surgical History  Procedure Laterality Date  . Ligament repair      R forearm   No family history on file. History  Substance Use Topics  . Smoking status: Never Smoker   . Smokeless tobacco: Not on file  . Alcohol Use: No    Review of Systems  Musculoskeletal: Positive for arthralgias, joint swelling and myalgias.       Right foot    Allergies  Review of patient's allergies indicates no known allergies.  Home Medications   Current Outpatient Rx  Name  Route  Sig  Dispense  Refill  . HYDROcodone-acetaminophen (NORCO/VICODIN) 5-325 MG per tablet   Oral   Take 1 tablet by mouth every 4 (four) hours as needed for pain.   12 tablet   0   . ondansetron (ZOFRAN ODT) 4 MG disintegrating tablet   Oral   Take 1 tablet (4 mg total) by mouth every 8 (eight) hours as needed for nausea.   10 tablet   0     Triage Vitals: BP 131/87  Pulse 91  Temp(Src) 98.5 F (36.9 C) (Oral)  Resp 18  SpO2 94%  Physical Exam  Nursing note and vitals reviewed. Constitutional: He is oriented to person, place, and time. He appears well-developed and well-nourished. No distress.  HENT:  Head: Normocephalic and atraumatic.  Eyes: Conjunctivae are normal.  Neck: Normal range of motion. Neck supple.  Pulmonary/Chest: Effort normal. No respiratory distress.  Abdominal: He exhibits no distension.  Musculoskeletal: Normal range of motion. He exhibits tenderness.  No tenderness over medial or lateral right malleolus. Tenderness over the medial arch of the right food. Pain with toe extension. Achilles tendon is intact and non-tender.  Neurological: He is alert and oriented to person, place, and time.  Skin: Skin is warm and dry. No erythema.  No swelling, erythema, or ecchymosis.   Psychiatric: He has a normal mood and affect. His behavior is normal.    ED Course  Procedures (including critical care time)  DIAGNOSTIC STUDIES: Oxygen Saturation is 94% on room air, adequate by my interpretation.    COORDINATION OF CARE: 3:21 PM- Discussed that symptoms are likely due to plantar fasciitis. Advised patient to apply ice and massage to the area and to take anti-inflammatory medications at home to manage symptoms. Advised patient to follow up with the referred  orthopedist if symptoms do not improve. Discussed treatment plan with patient at bedside and patient verbalized agreement.     Labs Review Labs Reviewed - No data to display Imaging Review No results found.  EKG Interpretation   None       MDM   1. Plantar fasciitis of right foot     PT with pain to the right plantar fascia. Suspect plantar fascitis based on exam. No signs of infection. No trauma. Home with RICE therapy, follow up with orthopedics. Discussed orthotic inserts.    Filed Vitals:   10/07/13 1400  BP: 131/87  Pulse: 91   Temp: 98.5 F (36.9 C)  TempSrc: Oral  Resp: 18  SpO2: 94%     I personally performed the services described in this documentation, which was scribed in my presence. The recorded information has been reviewed and is accurate.     Lottie Mussel, PA-C 10/07/13 1554

## 2013-10-07 NOTE — Discharge Instructions (Signed)
Ice your foot with frozen water bottle, freeze it in the freezer and roll under your foot several times a day. Naprosyn for pain. Ultram for severe pain. Stay off of your foot as much as able. Buy good arch supports to go into your shoes. Follow up with  Orthopedics.    Plantar Fasciitis Plantar fasciitis is a common condition that causes foot pain. It is soreness (inflammation) of the band of tough fibrous tissue on the bottom of the foot that runs from the heel bone (calcaneus) to the ball of the foot. The cause of this soreness may be from excessive standing, poor fitting shoes, running on hard surfaces, being overweight, having an abnormal walk, or overuse (this is common in runners) of the painful foot or feet. It is also common in aerobic exercise dancers and ballet dancers. SYMPTOMS  Most people with plantar fasciitis complain of:  Severe pain in the morning on the bottom of their foot especially when taking the first steps out of bed. This pain recedes after a few minutes of walking.  Severe pain is experienced also during walking following a long period of inactivity.  Pain is worse when walking barefoot or up stairs DIAGNOSIS   Your caregiver will diagnose this condition by examining and feeling your foot.  Special tests such as X-rays of your foot, are usually not needed. PREVENTION   Consult a sports medicine professional before beginning a new exercise program.  Walking programs offer a good workout. With walking there is a lower chance of overuse injuries common to runners. There is less impact and less jarring of the joints.  Begin all new exercise programs slowly. If problems or pain develop, decrease the amount of time or distance until you are at a comfortable level.  Wear good shoes and replace them regularly.  Stretch your foot and the heel cords at the back of the ankle (Achilles tendon) both before and after exercise.  Run or exercise on even surfaces that are not  hard. For example, asphalt is better than pavement.  Do not run barefoot on hard surfaces.  If using a treadmill, vary the incline.  Do not continue to workout if you have foot or joint problems. Seek professional help if they do not improve. HOME CARE INSTRUCTIONS   Avoid activities that cause you pain until you recover.  Use ice or cold packs on the problem or painful areas after working out.  Only take over-the-counter or prescription medicines for pain, discomfort, or fever as directed by your caregiver.  Soft shoe inserts or athletic shoes with air or gel sole cushions may be helpful.  If problems continue or become more severe, consult a sports medicine caregiver or your own health care provider. Cortisone is a potent anti-inflammatory medication that may be injected into the painful area. You can discuss this treatment with your caregiver. MAKE SURE YOU:   Understand these instructions.  Will watch your condition.  Will get help right away if you are not doing well or get worse. Document Released: 05/29/2001 Document Revised: 11/26/2011 Document Reviewed: 07/28/2008 [facility] Patient Information 2014 [place], [place].

## 2014-01-28 ENCOUNTER — Emergency Department (INDEPENDENT_AMBULATORY_CARE_PROVIDER_SITE_OTHER)
Admission: EM | Admit: 2014-01-28 | Discharge: 2014-01-28 | Disposition: A | Discharge status: Home / Self Care | Payer: Self-pay | Source: Home / Self Care | Attending: Family Medicine | Admitting: Family Medicine

## 2014-01-28 ENCOUNTER — Encounter (HOSPITAL_COMMUNITY): Payer: Self-pay | Admitting: Emergency Medicine

## 2014-01-28 DIAGNOSIS — S838X9A Sprain of other specified parts of unspecified knee, initial encounter: Secondary | ICD-10-CM

## 2014-01-28 DIAGNOSIS — S86819A Strain of other muscle(s) and tendon(s) at lower leg level, unspecified leg, initial encounter: Secondary | ICD-10-CM

## 2014-01-28 DIAGNOSIS — S86111A Strain of other muscle(s) and tendon(s) of posterior muscle group at lower leg level, right leg, initial encounter: Secondary | ICD-10-CM

## 2014-01-28 MED ORDER — NAPROXEN 500 MG PO TABS: 500.0000 mg | ORAL_TABLET | Freq: Two times a day (BID) | ORAL | Status: DC

## 2014-01-28 NOTE — ED Notes (Signed)
Pt  Reports he Twisted  His  r  Leg  About  5  Days  Ago  At  Work  He  Reports  Pain     Behind  r  Knee  As  Well as  Pain r  Lower leg          He  Ambulated  To  room with  A  Steady  Fluid  Gait

## 2014-01-28 NOTE — ED Provider Notes (Signed)
Right leg limited musculoskeletal ultrasound: Gastrocnemius palpated. Probe position of her area of maximal tenderness in both the medial and lateral heads the gastrocnemius. No significant tearing or muscle disruption seen. The  distal lateral hamstring was also visualized without significant tearing. Veins were compressible. Bony structures normal. Relatively normal musculoskeletal ultrasound.  Rodolph BongEvan S Huda Petrey, MD 01/28/14 1247

## 2014-01-28 NOTE — ED Provider Notes (Signed)
Medical screening examination/treatment/procedure(s) were performed by a resident physician or non-physician practitioner and as the supervising physician I was immediately available for consultation/collaboration.  Cinde Ebert, MD    Jiya Kissinger S Dagen Beevers, MD 01/28/14 1308 

## 2014-01-28 NOTE — ED Provider Notes (Signed)
CSN: [id number]     Arrival date & time 01/28/14  1102 History   First MD Initiated Contact with Patient 01/28/14 1133     Chief Complaint  Patient presents with  . 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 States his right foot became caught under a board and this caused his leg to twist .  PCP: [facility] Has not taken any medication at home for pain  The history is provided by the patient.    History reviewed. No pertinent past medical history. Past Surgical History  Procedure Laterality Date  . Ligament repair      R forearm   History reviewed. No pertinent family history. History  Substance Use Topics  . Smoking status: Never Smoker   . Smokeless tobacco: Not on file  . Alcohol Use: No    Review of Systems  All other systems reviewed and are negative.   Allergies  Review of patient's allergies indicates no known allergies.  Home Medications   Prior to Admission medications   Medication Sig Start Date End Date Taking? Authorizing Provider  naproxen (NAPROSYN) 500 MG tablet Take 1 tablet (500 mg total) by mouth 2 (two) times daily. 10/07/13   Tatyana A Kirichenko, PA-C  traMADol (ULTRAM) 50 MG tablet Take 1 tablet (50 mg total) by mouth every 6 (six) hours as needed. 10/07/13   Tatyana A Kirichenko, PA-C   BP 148/86  Pulse 72  Temp(Src) 98.6 F (37 C) (Oral)  Resp 14  SpO2 100% Physical Exam  Nursing note and vitals reviewed. Constitutional: He is oriented to person, place, and time. He appears well-developed and well-nourished. No distress.  HENT:  Head: Normocephalic and atraumatic.  Eyes: Conjunctivae are normal. No scleral icterus.  Cardiovascular: Normal rate.   Pulmonary/Chest: Effort normal.  Musculoskeletal: Normal range of motion. He exhibits  tenderness. He exhibits no edema.       Right lower leg: He exhibits tenderness. He exhibits no bony tenderness, no swelling, no edema, no deformity and no laceration.       Legs: CSM exam of RLE intact. No clinical indication of DVT. Gastrocnemius and achilles mechanism intact.   Neurological: He is alert and oriented to person, place, and time.  Skin: Skin is warm and dry. No rash noted. No erythema.  Psychiatric: He has a normal mood and affect. His behavior is normal.    ED Course  Procedures (including critical care time) Labs Review Labs Reviewed - No data to display  Imaging Review No results found.   MDM   1. Strain of right gastrocnemius muscle    [facility] be Dr. Denyse Amassorey. No indication of significant muscle tear or hematoma. Will treat with stretching exercises, NSAIDS and RICE therapy.     Jess BartersJennifer Lee LynnwoodPresson, GeorgiaPA 01/28/14 1227

## 2014-01-28 NOTE — Discharge Instructions (Signed)

## 2014-03-18 ENCOUNTER — Emergency Department (HOSPITAL_COMMUNITY)
Admission: EM | Admit: 2014-03-18 | Discharge: 2014-03-18 | Disposition: A | Discharge status: Home / Self Care | Payer: Self-pay | Attending: Emergency Medicine | Admitting: Emergency Medicine

## 2014-03-18 ENCOUNTER — Encounter (HOSPITAL_COMMUNITY): Payer: Self-pay | Admitting: Emergency Medicine

## 2014-03-18 DIAGNOSIS — I82409 Acute embolism and thrombosis of unspecified deep veins of unspecified lower extremity: Secondary | ICD-10-CM | POA: Insufficient documentation

## 2014-03-18 DIAGNOSIS — Z791 Long term (current) use of non-steroidal anti-inflammatories (NSAID): Secondary | ICD-10-CM | POA: Insufficient documentation

## 2014-03-18 DIAGNOSIS — I82401 Acute embolism and thrombosis of unspecified deep veins of right lower extremity: Secondary | ICD-10-CM

## 2014-03-18 DIAGNOSIS — M79609 Pain in unspecified limb: Secondary | ICD-10-CM

## 2014-03-18 LAB — APTT: aPTT: 27 seconds (ref 24–37)

## 2014-03-18 LAB — BASIC METABOLIC PANEL
Anion gap: 14 (ref 5–15)
BUN: 20 mg/dL (ref 6–23)
CO2: 26 mEq/L (ref 19–32)
CREATININE: 0.96 mg/dL (ref 0.50–1.35)
Calcium: 9.1 mg/dL (ref 8.4–10.5)
Chloride: 97 mEq/L (ref 96–112)
Glucose, Bld: 97 mg/dL (ref 70–99)
Potassium: 4.9 mEq/L (ref 3.7–5.3)
Sodium: 137 mEq/L (ref 137–147)

## 2014-03-18 LAB — CBC WITH DIFFERENTIAL/PLATELET
BASOS ABS: 0 10*3/uL (ref 0.0–0.1)
Basophils Relative: 0 % (ref 0–1)
EOS ABS: 0.4 10*3/uL (ref 0.0–0.7)
Eosinophils Relative: 3 % (ref 0–5)
HEMATOCRIT: 44.3 % (ref 39.0–52.0)
Hemoglobin: 15.8 g/dL (ref 13.0–17.0)
LYMPHS ABS: 2.7 10*3/uL (ref 0.7–4.0)
Lymphocytes Relative: 22 % (ref 12–46)
MCH: 28.5 pg (ref 26.0–34.0)
MCHC: 35.7 g/dL (ref 30.0–36.0)
MCV: 80 fL (ref 78.0–100.0)
Monocytes Absolute: 1.1 10*3/uL — ABNORMAL HIGH (ref 0.1–1.0)
Monocytes Relative: 9 % (ref 3–12)
NEUTROS ABS: 8.2 10*3/uL — AB (ref 1.7–7.7)
Neutrophils Relative %: 66 % (ref 43–77)
Platelets: 187 10*3/uL (ref 150–400)
RBC: 5.54 MIL/uL (ref 4.22–5.81)
RDW: 13.3 % (ref 11.5–15.5)
WBC: 12.4 10*3/uL — ABNORMAL HIGH (ref 4.0–10.5)

## 2014-03-18 LAB — PROTIME-INR
INR: 1.03 (ref 0.00–1.49)
Prothrombin Time: 13.5 seconds (ref 11.6–15.2)

## 2014-03-18 MED ORDER — MORPHINE SULFATE 4 MG/ML IJ SOLN
4.0000 mg | Freq: Once | INTRAMUSCULAR | Status: AC
  Administered 2014-03-18: 4 mg via INTRAVENOUS
  Filled 2014-03-18: qty 1

## 2014-03-18 MED ORDER — ONDANSETRON HCL 4 MG/2ML IJ SOLN
4.0000 mg | Freq: Once | INTRAMUSCULAR | Status: AC
  Administered 2014-03-18: 4 mg via INTRAVENOUS
  Filled 2014-03-18: qty 2

## 2014-03-18 MED ORDER — HYDROCODONE-ACETAMINOPHEN 5-325 MG PO TABS: 1.0000 | ORAL_TABLET | ORAL | Status: DC | PRN

## 2014-03-18 MED ORDER — XARELTO VTE STARTER PACK 15 & 20 MG PO TBPK: 15.0000 mg | ORAL_TABLET | ORAL | Status: DC

## 2014-03-18 MED ORDER — RIVAROXABAN 15 MG PO TABS
15.0000 mg | ORAL_TABLET | Freq: Once | ORAL | Status: AC
  Administered 2014-03-18: 15 mg via ORAL
  Filled 2014-03-18 (×2): qty 1

## 2014-03-18 NOTE — ED Notes (Signed)
David HaJonathan- Pharmacy instructing pt on xarelto.

## 2014-03-18 NOTE — ED Notes (Signed)
Pharamacy called st[facility] will do xarelto teaching

## 2014-03-18 NOTE — ED Notes (Signed)
Kaitilin PA at bedside.

## 2014-03-18 NOTE — Progress Notes (Signed)
VASCULAR LAB PRELIMINARY  PRELIMINARY  PRELIMINARY  PRELIMINARY  Right LEV  completed.    Preliminary report: Positive for DVT in the duplicate mid femoral, distal femoral, popliteal, 1 of the 2 PT and peroneal veins.  [facility]. Gionna Polak, RVT 03/18/2014, 4:49 PM

## 2014-03-18 NOTE — Discharge Planning (Signed)
[facility] Community Liaison  [facility] for Monday July 6,2015 at 3:15pm. Orange card application and my contact information provide for any future questions or concerns. Patient aware of this appointment, no other needs expressed at this time.

## 2014-03-18 NOTE — ED Notes (Signed)
Phlebotomy at bedside.

## 2014-03-18 NOTE — ED Notes (Signed)
Xarelto teaching done by pharmacy.  Discharge inst and prescriptions given to patient.  Voiced understanding.

## 2014-03-18 NOTE — ED Provider Notes (Signed)
Medical screening examination/treatment/procedure(s) were performed by non-physician practitioner and as supervising physician I was immediately available for consultation/collaboration.   EKG Interpretation None        Audree CamelScott T Arlita Buffkin, MD 03/18/14 [phone]

## 2014-03-18 NOTE — Discharge Instructions (Signed)
Take Xarelto as directed daily. Follow up with your doctor for further evaluation and management of your blood clot. Return to the ED with chest pain or shortness of breath. Refer to attached documents for more information.   Information on my medicine - XARELTO (rivaroxaban)  This medication education was reviewed with me or my healthcare representative as part of my discharge preparation.  The pharmacist that spoke with me during my hospital stay was:  Mickey FarberBinz, Mckennon Zwart A, [facility]  WHY WAS XARELTO PRESCRIBED FOR YOU? Xarelto was prescribed to treat blood clots that may have been found in the veins of your legs (deep vein thrombosis) or in your lungs (pulmonary embolism) and to reduce the risk of them occurring again.  What do you need to know about Xarelto? The starting dose is one 15 mg tablet taken TWICE daily with food for the FIRST 21 DAYS then on (enter date)  July 23rd, 2015  the dose is changed to one 20 mg tablet taken ONCE A DAY with your evening meal.  DO NOT stop taking Xarelto without talking to the health care provider who prescribed the medication.  Refill your prescription for 20 mg tablets before you run out.  After discharge, you should have regular check-up appointments with your healthcare provider that is prescribing your Xarelto.  In the future your dose may need to be changed if your kidney function changes by a significant amount.  What do you do if you miss a dose? If you are taking Xarelto TWICE DAILY and you miss a dose, take it as soon as you remember. You may take two 15 mg tablets (total 30 mg) at the same time then resume your regularly scheduled 15 mg twice daily the next day.  If you are taking Xarelto ONCE DAILY and you miss a dose, take it as soon as you remember on the same day then continue your regularly scheduled once daily regimen the next day. Do not take two doses of Xarelto at the same time.   Important Safety Information Xarelto is a blood thinner  medicine that can cause bleeding. You should call your healthcare provider right away if you experience any of the following:   Bleeding from an injury or your nose that does not stop.   Unusual colored urine (red or dark brown) or unusual colored stools (red or black).   Unusual bruising for unknown reasons.   A serious fall or if you hit your head (even if there is no bleeding).  Some medicines may interact with Xarelto and might increase your risk of bleeding while on Xarelto. To help avoid this, consult your healthcare provider or pharmacist prior to using any new prescription or non-prescription medications, including herbals, vitamins, non-steroidal anti-inflammatory drugs (NSAIDs) and supplements.  This website has more information on Xarelto: [email or web address].xarelto.[email or web address].

## 2014-03-18 NOTE — ED Provider Notes (Signed)
CSN: [id number]     Arrival date & time 03/18/14  1149 History   First MD Initiated Contact with Patient 03/18/14 1158     Chief Complaint  Patient presents with  . Leg Pain     (Consider location/radiation/quality/duration/timing/severity/associated sxs/prior Treatment) HPI Comments: Patient is a [age] year old male with no past medical history who presents with a 3 week history of right lower leg pain. Symptoms started after he injured his right leg when he was stepping out of his truck 3 weeks ago. He went to urgent care after the injury and was told he had a muscle strain of his calf. Patient continues to have throbbing pain in his right leg and now has associated swelling. Patient denies any new injury. Walking and palpating the leg makes the pain worse. No alleviating factors. Patient denies history of DVT/PE, recent travel/surgery/immobilization. Patient denies chest pain and SOB.   Patient is a 43 y.o. male presenting with leg pain.  Leg Pain Associated symptoms: no fatigue, no fever and no neck pain     History reviewed. No pertinent past medical history. Past Surgical History  Procedure Laterality Date  . Ligament repair      R forearm   History reviewed. No pertinent family history. History  Substance Use Topics  . Smoking status: Never Smoker   . Smokeless tobacco: Not on file  . Alcohol Use: No    Review of Systems  Constitutional: Negative for fever, chills and fatigue.  HENT: Negative for trouble swallowing.   Eyes: Negative for visual disturbance.  Respiratory: Negative for shortness of breath.   Cardiovascular: Positive for leg swelling. Negative for chest pain and palpitations.  Gastrointestinal: Negative for nausea, vomiting, abdominal pain and diarrhea.  Genitourinary: Negative for dysuria and difficulty urinating.  Musculoskeletal: Positive for myalgias. Negative for arthralgias and neck pain.  Skin: Negative for color change.  Neurological: Negative for  dizziness and weakness.  Psychiatric/Behavioral: Negative for dysphoric mood.      Allergies  Review of patient's allergies indicates no known allergies.  Home Medications   Prior to Admission medications   Medication Sig Start Date End Date Taking? Authorizing Provider  naproxen (NAPROSYN) 500 MG tablet Take 1 tablet (500 mg total) by mouth 2 (two) times daily. 10/07/13   Tatyana A Kirichenko, PA-C  naproxen (NAPROSYN) 500 MG tablet Take 1 tablet (500 mg total) by mouth 2 (two) times daily with a meal. As needed for pain 01/28/14   Jess BartersJennifer Lee Presson, PA  traMADol (ULTRAM) 50 MG tablet Take 1 tablet (50 mg total) by mouth every 6 (six) hours as needed. 10/07/13   Tatyana A Kirichenko, PA-C   BP 151/88  Pulse 75  Temp(Src) 98.5 F (36.9 C) (Oral)  Resp 18  Ht 5\' 9"  (1.753 m)  SpO2 97% Physical Exam  Nursing note and vitals reviewed. Constitutional: He appears well-developed and well-nourished. No distress.  HENT:  Head: Normocephalic and atraumatic.  Eyes: Conjunctivae and EOM are normal.  Neck: Normal range of motion.  Cardiovascular: Normal rate, regular rhythm and intact distal pulses.  Exam reveals no gallop and no friction rub.   No murmur heard. Pulmonary/Chest: Effort normal and breath sounds normal. He has no wheezes. He has no rales. He exhibits no tenderness.  Musculoskeletal: Normal range of motion.  Right lower leg 2+ pitting edema and right calf tenderness to palpation. No joint deformity or limited ROM.   Neurological: He is alert.  Speech is goal-oriented. Moves limbs without ataxia.  Skin: Skin is warm and dry.  Psychiatric: He has a normal mood and affect. His behavior is normal.    ED Course  Procedures (including critical care time) Labs Review Labs Reviewed  CBC WITH DIFFERENTIAL - Abnormal; Notable for the following:    WBC 12.4 (*)    Neutro Abs 8.2 (*)    Monocytes Absolute 1.1 (*)    All other components within normal limits  BASIC METABOLIC  PANEL  PROTIME-INR  APTT    Imaging Review No results found.   EKG Interpretation None      MDM   Final diagnoses:  DVT (deep venous thrombosis), right    12:14 PM Labs pending. Patient will have morphine for pain. Patient will have DVT study of right lower extremity to rule out DVT. Vitals stable and patient afebrile. Distal pulses intact. No neurovascular compromise noted.   1:53 PM Patient has a DVT noted on doppler study. I discussed the risks and benefits of coumadin vs xarelto. Patient prefers Xarelto. Patient will be started on his medication here. Patient will have recommended follow up with his PCP. Patient instructed to return with worsening or concerning symptoms.    Emilia BeckKaitlyn Amed Datta, PA-C 03/18/14 1456

## 2014-03-18 NOTE — Progress Notes (Signed)
  CARE MANAGEMENT ED NOTE 03/18/2014  Patient:  David Dunlap,David Dunlap   Account Number:  [id number]  Date Initiated:  03/18/2014  Documentation initiated by:  Radford PaxFERRERO,Harsh Trulock  Subjective/Objective Assessment:   [facility] with pain and swelling to right lower leg for three weeks.     Subjective/Objective Assessment Detail:     Action/Plan:   Patient diagnosed with DVT to be started on Xarelto   Action/Plan Detail:   Anticipated DC Date:  03/18/2014     Status Recommendation to Physician:   Result of Recommendation:    Other ED Services  Consult Working Plan    DC Planning Services  CM consult  Other  Medication Assistance    Choice offered to / List presented to:            Status of service:  Completed, signed off  ED Comments:   ED Comments Detail:  [facility] call from flow manager regarding patient's insurance card not working for [employer].  EDCM spoke to pharmacist [facility]lap [facility] [phone].  As per pharmacist, patient's insurnace not covering Xarelto prescription, needs assitance paying for medication.  Pharmacist doe not have any 30 day free trial discount Xarelto cards.  Pharmacist David Dunlap reports it would be okay for [facility] to fsx over activated [facility] [phone].  Upon chart review patient is listed as not having insurance.  EDCM activated 30 day free trial Xarelto prescription card #[id number] and confirmation [facility] [phone] at 2045pm with confirmation of receipt at 2047pm.  [facility] then called pharmacist David Dunlap back who reports she has received the fax and it will work to fill patient's prescription, however the [facility] left the [facility] because, "He didn't want to wait."  [facility] called patient at [phone] and left voice message stating that he should go back to the pharmacy where he may get his prescription filled for thirty days  free of Xarelto and to make a follow up appointment  with pcp for further assistance with Xarelto. [facility] reports she will save fax for the patient if he comes back tomorrow. No further EDCM needs at this time.

## 2014-03-18 NOTE — ED Notes (Signed)
Reports pain and swelling to right lower leg x 3 weeks. Went to [facility] and was told it was sprained calf muscle 3 weeks ago and no relief. Moderate swelling noted, pt ambulatory at triage.

## 2014-03-21 ENCOUNTER — Encounter (HOSPITAL_COMMUNITY): Payer: Self-pay | Admitting: Emergency Medicine

## 2014-03-21 ENCOUNTER — Emergency Department (HOSPITAL_COMMUNITY)
Admission: EM | Admit: 2014-03-21 | Discharge: 2014-03-21 | Disposition: A | Discharge status: Home / Self Care | Payer: Self-pay | Attending: Emergency Medicine | Admitting: Emergency Medicine

## 2014-03-21 DIAGNOSIS — Z7901 Long term (current) use of anticoagulants: Secondary | ICD-10-CM | POA: Insufficient documentation

## 2014-03-21 DIAGNOSIS — Z792 Long term (current) use of antibiotics: Secondary | ICD-10-CM | POA: Insufficient documentation

## 2014-03-21 DIAGNOSIS — I82409 Acute embolism and thrombosis of unspecified deep veins of unspecified lower extremity: Secondary | ICD-10-CM | POA: Insufficient documentation

## 2014-03-21 DIAGNOSIS — Z791 Long term (current) use of non-steroidal anti-inflammatories (NSAID): Secondary | ICD-10-CM | POA: Insufficient documentation

## 2014-03-21 DIAGNOSIS — O2230 Deep phlebothrombosis in pregnancy, unspecified trimester: Secondary | ICD-10-CM

## 2014-03-21 LAB — CBC WITH DIFFERENTIAL/PLATELET
BASOS PCT: 0 % (ref 0–1)
Basophils Absolute: 0 10*3/uL (ref 0.0–0.1)
Eosinophils Absolute: 0.2 10*3/uL (ref 0.0–0.7)
Eosinophils Relative: 2 % (ref 0–5)
HCT: 44.1 % (ref 39.0–52.0)
Hemoglobin: 15.7 g/dL (ref 13.0–17.0)
Lymphocytes Relative: 20 % (ref 12–46)
Lymphs Abs: 2.4 10*3/uL (ref 0.7–4.0)
MCH: 28.5 pg (ref 26.0–34.0)
MCHC: 35.6 g/dL (ref 30.0–36.0)
MCV: 80.2 fL (ref 78.0–100.0)
Monocytes Absolute: 0.9 10*3/uL (ref 0.1–1.0)
Monocytes Relative: 8 % (ref 3–12)
NEUTROS PCT: 70 % (ref 43–77)
Neutro Abs: 8.7 10*3/uL — ABNORMAL HIGH (ref 1.7–7.7)
PLATELETS: 195 10*3/uL (ref 150–400)
RBC: 5.5 MIL/uL (ref 4.22–5.81)
RDW: 13.3 % (ref 11.5–15.5)
WBC: 13 10*3/uL — ABNORMAL HIGH (ref 4.0–10.5)

## 2014-03-21 LAB — BASIC METABOLIC PANEL
Anion gap: 11 (ref 5–15)
BUN: 16 mg/dL (ref 6–23)
CO2: 28 mEq/L (ref 19–32)
Calcium: 9.3 mg/dL (ref 8.4–10.5)
Chloride: 99 mEq/L (ref 96–112)
Creatinine, Ser: 1.02 mg/dL (ref 0.50–1.35)
GFR calc non Af Amer: 88 mL/min — ABNORMAL LOW (ref >90)
Glucose, Bld: 103 mg/dL — ABNORMAL HIGH (ref 70–99)
POTASSIUM: 4.3 meq/L (ref 3.7–5.3)
SODIUM: 138 meq/L (ref 137–147)

## 2014-03-21 MED ORDER — OXYCODONE-ACETAMINOPHEN 5-325 MG PO TABS: 1.0000 | ORAL_TABLET | Freq: Four times a day (QID) | ORAL | Status: DC | PRN

## 2014-03-21 MED ORDER — CEPHALEXIN 500 MG PO CAPS: 500.0000 mg | ORAL_CAPSULE | Freq: Four times a day (QID) | ORAL | Status: DC

## 2014-03-21 MED ORDER — OXYCODONE-ACETAMINOPHEN 5-325 MG PO TABS
2.0000 | ORAL_TABLET | Freq: Once | ORAL | Status: AC
  Administered 2014-03-21: 2 via ORAL
  Filled 2014-03-21: qty 2

## 2014-03-21 NOTE — Discharge Instructions (Signed)
Follow up Monday with your md  as planned

## 2014-03-21 NOTE — ED Notes (Signed)
The pt is c/o redness  Swelling and pain in his rt lower extremity for 3 weeks when he was injured while working.  This past Friday he was diagnosed with a dvt in the rt lower leg.  He does not think he has a temp..  The rt lef is visibly more swollen then the lt

## 2014-03-21 NOTE — ED Provider Notes (Signed)
CSN: [id number]     Arrival date & time 03/21/14  1519 History   First MD Initiated Contact with Patient 03/21/14 1553     Chief Complaint  Patient presents with  . Leg Swelling     (Consider location/radiation/quality/duration/timing/severity/associated sxs/prior Treatment) Patient is a 43 y.o. male presenting with leg pain. The history is provided by the patient (the pt has been treated for dvt for a few days and here for pain and swelling to right leg.  ).  Leg Pain Location:  Leg Leg location:  R leg Pain details:    Quality:  Aching   Radiates to:  Does not radiate   Severity:  Moderate   Onset quality:  Gradual   Timing:  Constant   Progression:  Worsening Chronicity:  New Associated symptoms: no fatigue     History reviewed. No pertinent past medical history. Past Surgical History  Procedure Laterality Date  . Ligament repair      R forearm   No family history on file. History  Substance Use Topics  . Smoking status: Never Smoker   . Smokeless tobacco: Not on file  . Alcohol Use: No    Review of Systems  Constitutional: Negative for appetite change and fatigue.  HENT: Negative for congestion, ear discharge and sinus pressure.   Eyes: Negative for discharge.  Respiratory: Negative for cough.   Cardiovascular: Negative for chest pain.  Gastrointestinal: Negative for abdominal pain and diarrhea.  Genitourinary: Negative for frequency and hematuria.  Musculoskeletal:       Right leg swelling and pain  Skin: Negative for rash.  Neurological: Negative for seizures and headaches.  Psychiatric/Behavioral: Negative for hallucinations.      Allergies  Review of patient's allergies indicates no known allergies.  Home Medications   Prior to Admission medications   Medication Sig Start Date End Date Taking? Authorizing Provider  HYDROcodone-acetaminophen (NORCO/VICODIN) 5-325 MG per tablet Take 1-2 tablets by mouth every 4 (four) hours as needed for moderate  pain or severe pain. 03/18/14  Yes Kaitlyn Szekalski, PA-C  XARELTO STARTER PACK 15 & 20 MG TBPK Take 15-20 mg by mouth as directed. Take as directed on package: Start with one 15mg  tablet by mouth twice a day with food. On Day 22, switch to one 20mg  tablet once a day with food. 03/18/14  Yes Kaitlyn Szekalski, PA-C  cephALEXin (KEFLEX) 500 MG capsule Take 1 capsule (500 mg total) by mouth 4 (four) times daily. 03/21/14   Benny LennertJoseph L Manasa Spease, MD  naproxen (NAPROSYN) 500 MG tablet Take 1 tablet (500 mg total) by mouth 2 (two) times daily. 10/07/13   Tatyana A Kirichenko, PA-C  oxyCODONE-acetaminophen (PERCOCET/ROXICET) 5-325 MG per tablet Take 1 tablet by mouth every 6 (six) hours as needed for moderate pain or severe pain. 03/21/14   Benny LennertJoseph L Karia Ehresman, MD   BP 136/89  Pulse 79  Temp(Src) 98.6 F (37 C) (Oral)  Resp 18  Wt 246 lb 8 oz (111.812 kg)  SpO2 97% Physical Exam  Constitutional: He is oriented to person, place, and time. He appears well-developed.  HENT:  Head: Normocephalic.  Eyes: Conjunctivae and EOM are normal. No scleral icterus.  Neck: Neck supple. No thyromegaly present.  Cardiovascular: Normal rate and regular rhythm.  Exam reveals no gallop and no friction rub.   No murmur heard. Pulmonary/Chest: No stridor. He has no wheezes. He has no rales. He exhibits no tenderness.  Abdominal: He exhibits no distension. There is no tenderness. There is no  rebound.  Musculoskeletal:  Swelling tenderness to right leg  Lymphadenopathy:    He has no cervical adenopathy.  Neurological: He is oriented to person, place, and time. He exhibits normal muscle tone. Coordination normal.  Skin: No rash noted. No erythema.  Psychiatric: He has a normal mood and affect. His behavior is normal.    ED Course  Procedures (including critical care time) Labs Review Labs Reviewed  CBC WITH DIFFERENTIAL - Abnormal; Notable for the following:    WBC 13.0 (*)    Neutro Abs 8.7 (*)    All other components within  normal limits  BASIC METABOLIC PANEL - Abnormal; Notable for the following:    Glucose, Bld 103 (*)    GFR calc non Af Amer 88 (*)    All other components within normal limits    Imaging Review No results found.   EKG Interpretation None      MDM   Final diagnoses:  DVT (deep vein thrombosis) in pregnancy    Pt with dvt,  Pt told to keep leg elevated.  Pt given percocet for pain and keflex for possible cellulitis in leg.  He will follow up Monday with pcp   Benny LennertJoseph L Haisley Arens, MD 03/21/14 [phone]

## 2014-03-21 NOTE — ED Notes (Signed)
Pt diagnosed with DVT Wednesday.  Pt states his RLE is swelling worse.

## 2014-03-22 ENCOUNTER — Ambulatory Visit (INDEPENDENT_AMBULATORY_CARE_PROVIDER_SITE_OTHER): Payer: Self-pay | Admitting: Family Medicine

## 2014-03-22 ENCOUNTER — Encounter: Payer: Self-pay | Admitting: Family Medicine

## 2014-03-22 VITALS — BP 135/89 | HR 87 | Temp 98.2°F | Ht 69.0 in | Wt 247.0 lb

## 2014-03-22 DIAGNOSIS — I824Y9 Acute embolism and thrombosis of unspecified deep veins of unspecified proximal lower extremity: Secondary | ICD-10-CM

## 2014-03-22 DIAGNOSIS — I82411 Acute embolism and thrombosis of right femoral vein: Secondary | ICD-10-CM

## 2014-03-22 DIAGNOSIS — I82419 Acute embolism and thrombosis of unspecified femoral vein: Secondary | ICD-10-CM | POA: Insufficient documentation

## 2014-03-22 NOTE — Assessment & Plan Note (Signed)
A: extensive clot burden, doubt overlying cellulitis component (was d/w Dr. Deirdre Priesthambliss) Provoked, on xarelto, likely needs 3-6 months of therapy Signif pain, suspect narcs are largely not beneficial Denies CP, SOB, or DOE, doubt extension into lung fields  P: cont elevation, compression Suggested stockings but pt cannot afford  Would rec continued ace bandage Cont xarelto BID 15mg  and then switch over 20mg  daily on day 22nd Pt verbalized understanding

## 2014-03-22 NOTE — Progress Notes (Signed)
Patient ID: [name] Blumenfeld, male   DOB: Jun 04, 1971, 43 y.o.   MRN: [id number]   [facility] Charlane FerrettiMelanie C Yuma Pacella, MD Phone: [phone]  Subjective:  Ms [name] is a 43 y.o M who presents for newly dx DVT, f/up  # Provoked DVT -presented initially on 03/18/14 with a 3 week history of right lower leg pain. Symptoms started after he injured his right leg when he was stepping out of his truck 3 weeks ago. He went to urgent care after the injury and was told he had a muscle strain of his calf -DVT was noted on doppler study, discussed coumadin vs xarelto, decided to go with xarelto  -pt's sx are worse than previous, was given percocet for pain control and kelfex in the ED on 7/5 -since that time has felt more discomfort, has been moving things and mowing lawn, having hard time resting with 2 children  -denies SOB, chest pain, pressure or palpitations -denies personal or fmhx of clot burden  All systems were reviewed and were negative unless otherwise noted in the HPI  Past Medical History Patient Active Problem List   Diagnosis Date Noted  . Moderate major depression, single episode 08/22/2011  . Constipation 11/22/2010  . ANKLE EDEMA 10/19/2010  . ELEVATED BLOOD PRESSURE WITHOUT DIAGNOSIS OF HYPERTENSION 10/12/2010   Reviewed problem list.  Medications- reviewed and updated Chief complaint-noted No additions to family history Social history- patient is a never smoker  Objective: BP 135/89  Pulse 87  Temp(Src) 98.2 F (36.8 C) (Oral)  Ht 5\' 9"  (1.753 m)  Wt 247 lb (112.038 kg)  BMI 36.46 kg/m2 Gen: NAD, alert, cooperative with exam HEENT: NCAT, EOMI, Neck: FROM, supple CV: RRR, good S1/S2, no murmur, cap refill <3; 2+ DP bilaterally Resp: CTABL, no wheezes, non-labored Ext: significant swelling and redness of right leg, extending from ankle to knee with pitting edema, sml superficial scraps Neuro: Alert and oriented, No gross deficits, no nerve  deficits, sensation of toes intact  Skin: no rashes no lesions  Assessment/Plan: See problem based a/p

## 2014-03-22 NOTE — Patient Instructions (Addendum)
Mr David Dunlap it was great to see you today!  I am to hear that things are not going well for you.  The most important thing will be to rest this limb and relax.  Please try to keep the leg as elevated as possible.  You can try wearing compression stockings to help the blood flow back to your heart Continue to take the xarelto 15mg  BID for 21 days then switch to 20mg  on the 22nd day When you are nearing the end of your medication please make an appointment with the pharmacist here, Dr. Raymondo BandKoval or myself Cont to take pain meds as they are helping you but avoid NSAIDs such as ibuprofen or naproxen or aleve while taking blood thinners   Looking forward to seeing you soon Charlane FerrettiMelanie C Sivan Cuello, MD

## 2014-03-23 NOTE — Progress Notes (Signed)
[facility] a control being torn off. Requesting verification on the second half of prescription. Verified Keflex on the second half. Pharmacist will not fill without the other half as per pharmacist.

## 2014-05-28 ENCOUNTER — Emergency Department (HOSPITAL_COMMUNITY)
Admission: EM | Admit: 2014-05-28 | Discharge: 2014-05-28 | Disposition: A | Discharge status: Home / Self Care | Payer: Self-pay | Attending: Emergency Medicine | Admitting: Emergency Medicine

## 2014-05-28 ENCOUNTER — Encounter (HOSPITAL_COMMUNITY): Payer: Self-pay | Admitting: Emergency Medicine

## 2014-05-28 DIAGNOSIS — Z792 Long term (current) use of antibiotics: Secondary | ICD-10-CM | POA: Insufficient documentation

## 2014-05-28 DIAGNOSIS — Z86718 Personal history of other venous thrombosis and embolism: Secondary | ICD-10-CM

## 2014-05-28 DIAGNOSIS — M79609 Pain in unspecified limb: Secondary | ICD-10-CM | POA: Insufficient documentation

## 2014-05-28 DIAGNOSIS — I82411 Acute embolism and thrombosis of right femoral vein: Secondary | ICD-10-CM

## 2014-05-28 DIAGNOSIS — Z7901 Long term (current) use of anticoagulants: Secondary | ICD-10-CM | POA: Insufficient documentation

## 2014-05-28 DIAGNOSIS — Z791 Long term (current) use of non-steroidal anti-inflammatories (NSAID): Secondary | ICD-10-CM | POA: Insufficient documentation

## 2014-05-28 DIAGNOSIS — I82409 Acute embolism and thrombosis of unspecified deep veins of unspecified lower extremity: Secondary | ICD-10-CM | POA: Insufficient documentation

## 2014-05-28 HISTORY — DX: Acute embolism and thrombosis of unspecified deep veins of unspecified lower extremity: I82.409

## 2014-05-28 MED ORDER — RIVAROXABAN 20 MG PO TABS: 20.0000 mg | ORAL_TABLET | Freq: Every day | ORAL | Status: DC

## 2014-05-28 MED ORDER — HYDROCODONE-ACETAMINOPHEN 5-325 MG PO TABS: 1.0000 | ORAL_TABLET | Freq: Four times a day (QID) | ORAL | Status: DC | PRN

## 2014-05-28 MED ORDER — OXYCODONE-ACETAMINOPHEN 5-325 MG PO TABS
1.0000 | ORAL_TABLET | Freq: Once | ORAL | Status: AC
  Administered 2014-05-28: 1 via ORAL
  Filled 2014-05-28: qty 1

## 2014-05-28 NOTE — ED Provider Notes (Signed)
CSN: [id number]     Arrival date & time 05/28/14  1126 History   First MD Initiated Contact with Patient 05/28/14 1424     Chief Complaint  Patient presents with  . Leg Pain     (Consider location/radiation/quality/duration/timing/severity/associated sxs/prior Treatment) The history is provided by the patient.  David Dunlap is a 42 y.o. male hx of DVT here with worse right leg pain. He was diagnosed with DVT several months ago. Finished xarelto starter pack. He didn't have money so didn't continue taking xarelto for the last 3 weeks. He noticed worsening swelling R thigh. Denies chest pain or shortness of breath.    Past Medical History  Diagnosis Date  . DVT (deep venous thrombosis)    Past Surgical History  Procedure Laterality Date  . Ligament repair      R forearm   No family history on file. History  Substance Use Topics  . Smoking status: Never Smoker   . Smokeless tobacco: Not on file  . Alcohol Use: No    Review of Systems  Musculoskeletal:       L leg swelling   All other systems reviewed and are negative.     Allergies  Review of patient's allergies indicates no known allergies.  Home Medications   Prior to Admission medications   Medication Sig Start Date End Date Taking? Authorizing Provider  cephALEXin (KEFLEX) 500 MG capsule Take 1 capsule (500 mg total) by mouth 4 (four) times daily. 03/21/14   Benny Lennert, MD  HYDROcodone-acetaminophen (NORCO/VICODIN) 5-325 MG per tablet Take 1-2 tablets by mouth every 4 (four) hours as needed for moderate pain or severe pain. 03/18/14   Kaitlyn Szekalski, PA-C  naproxen (NAPROSYN) 500 MG tablet Take 1 tablet (500 mg total) by mouth 2 (two) times daily. 10/07/13   Tatyana A Kirichenko, PA-C  oxyCODONE-acetaminophen (PERCOCET/ROXICET) 5-325 MG per tablet Take 1 tablet by mouth every 6 (six) hours as needed for moderate pain or severe pain. 03/21/14   Benny Lennert, MD  XARELTO STARTER PACK 15 & 20 MG TBPK Take  15-20 mg by mouth as directed. Take as directed on package: Start with one  tablet by mouth twice a day with food. On Day 22, switch to one  tablet once a day with food. 03/18/14   Kaitlyn Szekalski, PA-C   BP 126/94  Pulse 80  Temp(Src) 98.6 F (37 C) (Oral)  Resp 18  Ht  (1.753 m)  Wt 254 lb 6.4 oz (115.395 kg)  BMI 37.55 kg/m2  SpO2 100% Physical Exam  Nursing note and vitals reviewed. Constitutional: He is oriented to person, place, and time. He appears well-developed and well-nourished.  HENT:  Head: Normocephalic.  Mouth/Throat: Oropharynx is clear and moist.  Eyes: Conjunctivae are normal. Pupils are equal, round, and reactive to light.  Neck: Normal range of motion. Neck supple.  Cardiovascular: Normal rate, regular rhythm and normal heart sounds.   Pulmonary/Chest: Effort normal and breath sounds normal. No respiratory distress. He has no wheezes. He has no rales.  Abdominal: Soft. Bowel sounds are normal. He exhibits no distension. There is no tenderness. There is no rebound.  Musculoskeletal:  R leg swelling, mild calf tenderness. 2+ pulses. Nl sensation and motor strength   Neurological: He is alert and oriented to person, place, and time. No cranial nerve deficit. Coordination normal.  Skin: Skin is warm and dry.  Psychiatric: He has a normal mood and affect. His behavior is normal. Judgment and  thought content normal.    ED Course  Procedures (including critical care time) Labs Review Labs Reviewed - No data to display  Imaging Review No results found.   EKG Interpretation None      MDM   Final diagnoses:  Acute deep vein thrombosis (DVT) of femoral vein of right lower extremity   David Dunlap is a 43 y.o. male here with R leg swelling. [place] today showed subacute clot, improved since previous. I discussed with pharmacy and with case manager. Will restart xarelto 20 mg daily. Case manager able to make him appointment at [facility]. He is  eligible to get medication assistance from our pharmcy. No signs of PE.      Richardean Canal, MD 05/28/14 1620

## 2014-05-28 NOTE — ED Notes (Signed)
Pt states he was dx with a DVT 3 months ago and ran out of his blood clot medication xarelto the past 3 weeks. C/o pain up his thigh on the right. Does feel some swelling to the right leg.

## 2014-05-28 NOTE — Progress Notes (Addendum)
*  PRELIMINARY RESULTS* Vascular Ultrasound Right lower extremity venous duplex has been completed.  Preliminary findings: Evidence of subacute partial DVT involving the Right distal femoral vein, popliteal vein, and peroneal veins.  DVT appears to have somewhat improved compared to previous study from 03/18/14.   Farrel Demark, RDMS, RVT  05/28/2014, 2:52 PM

## 2014-05-28 NOTE — Discharge Instructions (Signed)
Take vicodin as prescribed. Do NOT drive with it.  Go to wellness center.   Take xarelto as prescribed.   Return to ER if you have worse leg pain, shortness of breath.

## 2014-05-31 ENCOUNTER — Telehealth: Payer: Self-pay | Admitting: Family Medicine

## 2014-05-31 NOTE — Telephone Encounter (Signed)
To [facility] for clarification. Fleeger, Maryjo Rochester

## 2014-05-31 NOTE — Progress Notes (Signed)
Addendum ED CM was consulted by Dr. Silverio Lay regarding patient needing medication assistance. Patient presented to [facility] ED with worse right leg pain. He was diagnosed with DVT several months ago. Finished xarelto starter pack. He didn't have money so didn't continue taking xarelto for the last 3 weeks. He noticed worsening swelling R thigh. Reviewed record and confirmed information with patient. Patient reports, he ran out of medications and called the PCP for appointment and claims he was told that the next available appoint was next month, so he decided to come to the ED. Contatced the OP [facility] pharmacy regarding  Xarelto availability. Patient need a follow up appt.,  Scheduled appt with Dr. Daleen Squibb. Cardiologist  Patient was instructed to go to the [facility] to pick up Xarelto. Patient made aware of appt date time 9/23 at 2p and he will continue to see  PCP for any subsequent visits Patient verbalized understanding teach back . No further ED Cm needs identified.

## 2014-05-31 NOTE — Telephone Encounter (Signed)
Apparently had run out of medications. Per ED note was set up with script. Appears he is going to [facility] for care next Wednesday? Unclear whether he intends to f/up with them or with me for long term PCP  [facility], MD

## 2014-05-31 NOTE — Telephone Encounter (Signed)
Message copied by Charlane Ferretti on Mon May 31, 2014  1:48 PM ------      Message from: Kathrin Ruddy      Created: Mon May 31, 2014  1:42 PM       Shawna Orleans,             I got a note from a [facility] Pharmacist that Mr. Olden ran out of medication.    I wanted to share what I heard just as an [place].             I believe you know we have supply of this medication as a sample.             Please let me know if you think we need to follow up more in follow up.              ------

## 2014-06-01 ENCOUNTER — Ambulatory Visit: Payer: Self-pay | Attending: Internal Medicine

## 2014-06-09 ENCOUNTER — Ambulatory Visit: Payer: Self-pay | Admitting: Cardiology

## 2014-09-21 NOTE — Telephone Encounter (Signed)
Patient has not had office visit at [facility] since 03/2014.  Called and left message for patient to call me back to discuss whether he has changed to another PCP or will f/up with Dr. Michail JewelsMarsh.  Altamese Dilling~Jeannette Richardson, BSN, RN-BC

## 2014-11-16 ENCOUNTER — Emergency Department (HOSPITAL_COMMUNITY)
Admission: EM | Admit: 2014-11-16 | Discharge: 2014-11-17 | Disposition: A | Discharge status: Home / Self Care | Payer: Self-pay | Attending: Emergency Medicine | Admitting: Emergency Medicine

## 2014-11-16 DIAGNOSIS — I82402 Acute embolism and thrombosis of unspecified deep veins of left lower extremity: Secondary | ICD-10-CM | POA: Insufficient documentation

## 2014-11-16 DIAGNOSIS — R600 Localized edema: Secondary | ICD-10-CM | POA: Insufficient documentation

## 2014-11-16 DIAGNOSIS — Z792 Long term (current) use of antibiotics: Secondary | ICD-10-CM | POA: Insufficient documentation

## 2014-11-16 DIAGNOSIS — M7989 Other specified soft tissue disorders: Secondary | ICD-10-CM | POA: Insufficient documentation

## 2014-11-16 DIAGNOSIS — Z7901 Long term (current) use of anticoagulants: Secondary | ICD-10-CM | POA: Insufficient documentation

## 2014-11-16 DIAGNOSIS — R609 Edema, unspecified: Secondary | ICD-10-CM

## 2014-11-16 DIAGNOSIS — Z791 Long term (current) use of non-steroidal anti-inflammatories (NSAID): Secondary | ICD-10-CM | POA: Insufficient documentation

## 2014-11-16 MED ORDER — ENOXAPARIN SODIUM 120 MG/0.8ML ~~LOC~~ SOLN
1.0000 mg/kg | Freq: Once | SUBCUTANEOUS | Status: AC
  Administered 2014-11-17: 120 mg via SUBCUTANEOUS
  Filled 2014-11-16: qty 0.8

## 2014-11-16 MED ORDER — OXYCODONE-ACETAMINOPHEN 5-325 MG PO TABS
1.0000 | ORAL_TABLET | Freq: Once | ORAL | Status: AC
  Administered 2014-11-17: 1 via ORAL
  Filled 2014-11-16: qty 1

## 2014-11-16 NOTE — ED Provider Notes (Signed)
CSN: [id number]     Arrival date & time 11/16/14  2029 History  This chart was scribed for Dione Booze, MD by Bronson Curb, ED Scribe. This patient was seen in room B18C/B18C and the patient's care was started at 11:43 PM.   Chief Complaint  Patient presents with  . Leg Swelling    The history is provided by the patient. No language interpreter was used.    HPI Comments: David Dunlap is a 44 y.o. male, with history of DVT, who presents to the Emergency Department complaining of worsening, bilateral lower leg swelling for the past "couple of days". He notes the swelling is from his bilateral knees to the bilateral ankles. Patient notes there is a blood clot in his right leg, and suspects his left leg has a blood clot as well. There is associated 8.5/10 pain in bilateral legs for which he takes ibuprofen. He states he is currently out of his prescribed anticoagulant, but is unable to afford to get the prescription refilled. Patient states his last dose was 2 months ago. 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 Patient also mentions his can feel his knees pop when ambulating. Patient denies recent surgery, prolonged travel, injury, or trauma. He further denies SOB, chest pain, fever, chills, diaphoresis. Patient is a nonsmoker.  PCP at [facility].  Past Medical History  Diagnosis Date  . DVT (deep venous thrombosis)    Past Surgical History  Procedure Laterality Date  . Ligament repair      R forearm   No family history on file. History  Substance Use Topics  . Smoking status: Never Smoker   . Smokeless tobacco: Not on file  . Alcohol Use: No    Review of Systems  Constitutional: Negative for fever, chills and diaphoresis.  Respiratory: Negative for shortness of breath.   Cardiovascular: Positive for leg swelling. Negative for chest pain.  Musculoskeletal: Positive for myalgias.  All  other systems reviewed and are negative.     Allergies  Review of patient's allergies indicates no known allergies.  Home Medications   Prior to Admission medications   Medication Sig Start Date End Date Taking? Authorizing Provider  cephALEXin (KEFLEX) 500 MG capsule Take 1 capsule (500 mg total) by mouth 4 (four) times daily. 03/21/14   Benny Lennert, MD  HYDROcodone-acetaminophen (NORCO/VICODIN) 5-325 MG per tablet Take 1-2 tablets by mouth every 4 (four) hours as needed for moderate pain or severe pain. 03/18/14   Emilia Beck, PA-C  HYDROcodone-acetaminophen (NORCO/VICODIN) 5-325 MG per tablet Take 1 tablet by mouth every 6 (six) hours as needed for moderate pain or severe pain. 05/28/14   Richardean Canal, MD  naproxen (NAPROSYN) 500 MG tablet Take 1 tablet (500 mg total) by mouth 2 (two) times daily. 10/07/13   Tatyana A Kirichenko, PA-C  oxyCODONE-acetaminophen (PERCOCET/ROXICET) 5-325 MG per tablet Take 1 tablet by mouth every 6 (six) hours as needed for moderate pain or severe pain. 03/21/14   Benny Lennert, MD  rivaroxaban (XARELTO) 20 MG TABS tablet Take 1 tablet (20 mg total) by mouth daily with supper. 05/28/14   Richardean Canal, MD  XARELTO STARTER PACK 15 & 20 MG TBPK Take 15-20 mg by mouth as directed. Take as directed on package: Start with one  tablet by mouth twice a day with food. On Day 22, switch to one  tablet once a day  with food. 03/18/14   Emilia BeckKaitlyn Szekalski, PA-C   Triage Vitals: Pulse 89  Temp(Src) 98.3 F (36.8 C) (Oral)  Resp 20  Ht 5\' 9"  (1.753 m)  Wt 265 lb (120.203 kg)  BMI 39.12 kg/m2  SpO2 96%  Physical Exam  Constitutional: He is oriented to person, place, and time. He appears well-developed and well-nourished. No distress.  HENT:  Head: Normocephalic and atraumatic.  Eyes: Conjunctivae and EOM are normal. Pupils are equal, round, and reactive to light.  Neck: Normal range of motion. Neck supple. No JVD present.  Cardiovascular: Normal rate, regular  rhythm and normal heart sounds.   No murmur heard. Pulmonary/Chest: Effort normal and breath sounds normal. He has no wheezes. He has no rales. He exhibits no tenderness.  Abdominal: Soft. Bowel sounds are normal. He exhibits no distension and no mass. There is no tenderness.  Musculoskeletal: Normal range of motion. He exhibits edema and tenderness.  1+ pitting edema bilaterally. Right calf circumference 2cm greater than left calf circumference.  Mild tenderness bipopliteal and proximal calf bilaterally.  Lymphadenopathy:    He has no cervical adenopathy.  Neurological: He is alert and oriented to person, place, and time. No cranial nerve deficit.  Skin: Skin is warm and dry. No rash noted.  Psychiatric: He has a normal mood and affect. His behavior is normal. Judgment and thought content normal.  Nursing note and vitals reviewed.   ED Course  Procedures (including critical care time)  DIAGNOSTIC STUDIES: Oxygen Saturation is 96% on room air, adequate by my interpretation.    COORDINATION OF CARE: At 2357 Discussed treatment plan with patient which includes labs, Lovenox injection, and doppler US. Patient informed the US will have to be done in the morning. Patient also informed of alternate anticoagulant, Warfarin. Patient agrees and will stay overnight in the wait holding area to have US performed.  Labs Review Results for orders placed or performed during the hospital encounter of 11/16/14  Basic metabolic panel  Result Value Ref Range   Sodium 133 (L) 135 - 145 mmol/L   Potassium 4.3 3.5 - 5.1 mmol/L   Chloride 102 96 - 112 mmol/L   CO2 26 19 - 32 mmol/L   Glucose, Bld 113 (H) 70 - 99 mg/dL   BUN 15 6 - 23 mg/dL   Creatinine, Ser 4.541.23 0.50 - 1.35 mg/dL   Calcium 8.5 8.4 - 09.810.5 mg/dL   GFR calc non Af Amer 69 (L) >90 mL/min   GFR calc Af Amer 80 (L) >90 mL/min   Anion gap 5 5 - 15  CBC with Differential  Result Value Ref Range   WBC 13.8 (H) 4.0 - 10.5 K/uL   RBC 5.54  4.22 - 5.81 MIL/uL   Hemoglobin 16.4 13.0 - 17.0 g/dL   HCT 11.945.3 14.739.0 - 82.952.0 %   MCV 81.8 78.0 - 100.0 fL   MCH 29.6 26.0 - 34.0 pg   MCHC 36.2 (H) 30.0 - 36.0 g/dL   RDW 56.213.5 13.011.5 - 86.515.5 %   Platelets 191 150 - 400 K/uL   Neutrophils Relative % 64 43 - 77 %   Lymphocytes Relative 26 12 - 46 %   Monocytes Relative 9 3 - 12 %   Eosinophils Relative 1 0 - 5 %   Basophils Relative 0 0 - 1 %   Neutro Abs 8.9 (H) 1.7 - 7.7 K/uL   Lymphs Abs 3.6 0.7 - 4.0 K/uL   Monocytes Absolute 1.2 (H) 0.1 - 1.0  K/uL   Eosinophils Absolute 0.1 0.0 - 0.7 K/uL   Basophils Absolute 0.0 0.0 - 0.1 K/uL   WBC Morphology ATYPICAL LYMPHOCYTES    MDM   Final diagnoses:  Peripheral edema  Leg swelling    Patient with history of DVT who stopped taking his anticoagulant because of cost. He now comes in with swelling of his legs worrisome for recurrent DVT. He will be sent for venous Doppler to evaluate this.  I personally performed the services described in this documentation, which was scribed in my presence. The recorded information has been reviewed and is accurate.      Dione Booze, MD 11/17/14 [phone]

## 2014-11-16 NOTE — ED Notes (Addendum)
Pt c/o bil lower leg swelling x's 1 week.  Pt denies shortness of breath.  Pt c/o pain in bil legs

## 2014-11-17 DIAGNOSIS — M7989 Other specified soft tissue disorders: Secondary | ICD-10-CM

## 2014-11-17 LAB — BASIC METABOLIC PANEL
ANION GAP: 5 (ref 5–15)
BUN: 15 mg/dL (ref 6–23)
CALCIUM: 8.5 mg/dL (ref 8.4–10.5)
CO2: 26 mmol/L (ref 19–32)
Chloride: 102 mmol/L (ref 96–112)
Creatinine, Ser: 1.23 mg/dL (ref 0.50–1.35)
GFR calc Af Amer: 80 mL/min — ABNORMAL LOW (ref >90)
GFR calc non Af Amer: 69 mL/min — ABNORMAL LOW (ref >90)
Glucose, Bld: 113 mg/dL — ABNORMAL HIGH (ref 70–99)
Potassium: 4.3 mmol/L (ref 3.5–5.1)
Sodium: 133 mmol/L — ABNORMAL LOW (ref 135–145)

## 2014-11-17 LAB — CBC WITH DIFFERENTIAL/PLATELET
Basophils Absolute: 0 10*3/uL (ref 0.0–0.1)
Basophils Relative: 0 % (ref 0–1)
EOS ABS: 0.1 10*3/uL (ref 0.0–0.7)
Eosinophils Relative: 1 % (ref 0–5)
HCT: 45.3 % (ref 39.0–52.0)
Hemoglobin: 16.4 g/dL (ref 13.0–17.0)
Lymphocytes Relative: 26 % (ref 12–46)
Lymphs Abs: 3.6 10*3/uL (ref 0.7–4.0)
MCH: 29.6 pg (ref 26.0–34.0)
MCHC: 36.2 g/dL — AB (ref 30.0–36.0)
MCV: 81.8 fL (ref 78.0–100.0)
Monocytes Absolute: 1.2 10*3/uL — ABNORMAL HIGH (ref 0.1–1.0)
Monocytes Relative: 9 % (ref 3–12)
NEUTROS PCT: 64 % (ref 43–77)
Neutro Abs: 8.9 10*3/uL — ABNORMAL HIGH (ref 1.7–7.7)
PLATELETS: 191 10*3/uL (ref 150–400)
RBC: 5.54 MIL/uL (ref 4.22–5.81)
RDW: 13.5 % (ref 11.5–15.5)
WBC: 13.8 10*3/uL — ABNORMAL HIGH (ref 4.0–10.5)

## 2014-11-17 MED ORDER — OXYCODONE-ACETAMINOPHEN 5-325 MG PO TABS
1.0000 | ORAL_TABLET | Freq: Once | ORAL | Status: AC
  Administered 2014-11-17: 1 via ORAL
  Filled 2014-11-17: qty 1

## 2014-11-17 MED ORDER — OXYCODONE-ACETAMINOPHEN 5-325 MG PO TABS: 1.0000 | ORAL_TABLET | Freq: Four times a day (QID) | ORAL | Status: DC | PRN

## 2014-11-17 MED ORDER — OXYCODONE-ACETAMINOPHEN 5-325 MG PO TABS: 1.0000 | ORAL_TABLET | Freq: Once | ORAL | Status: DC

## 2014-11-17 MED ORDER — RIVAROXABAN 15 MG PO TABS
15.0000 mg | ORAL_TABLET | Freq: Once | ORAL | Status: AC
  Administered 2014-11-17: 15 mg via ORAL
  Filled 2014-11-17: qty 1

## 2014-11-17 MED ORDER — XARELTO VTE STARTER PACK 15 & 20 MG PO TBPK: 15.0000 mg | ORAL_TABLET | ORAL | Status: DC

## 2014-11-17 NOTE — ED Notes (Signed)
Patient states he has been on xaralto for a blood clot but ran out about a month ago. States he didn't have the money to get it filled. Social worker made aware of pt financial situation and will consult and offer pt means to get his xaralto restarted.

## 2014-11-17 NOTE — ED Provider Notes (Signed)
VASCULAR LAB PRELIMINARY PRELIMINARY PRELIMINARY PRELIMINARY  Bilateral lower extremity venous duplex completed.   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 No evidence of superficial thrombosis. No Baker's cyst. Left: Acute, occlusive DVT noted in the mid to distal PTV. No evidence of superficial thrombosis. No Baker's cyst.   CESTONE, HELENE, RVT 11/17/2014, 8:55 AM   Will start patient back on xarelto.  He stopped taking it one month ago.  Will rx pain meds.  Vitals stable.   Linwood DibblesJon Lourine Alberico, MD 11/17/14 [phone]

## 2014-11-17 NOTE — Progress Notes (Signed)
VASCULAR LAB PRELIMINARY  PRELIMINARY  PRELIMINARY  PRELIMINARY  Bilateral lower extremity venous duplex  completed.    Preliminary report:  Right:  Chronic, non occlusive DVT noted in the popliteal vein.  No evidence of superficial thrombosis.  No Baker's cyst.  Left:  Acute, occlusive DVT noted in the mid to distal PTV.  No evidence of superficial thrombosis.  No Baker's cyst.   Genna Casimir, RVT 11/17/2014, 8:55 AM

## 2014-11-17 NOTE — Progress Notes (Signed)
LCSW consulted due to patient inability to pay for medications. LCSW met with patient at the bedside. Patient reports he follows at [facility] downstairs at [facility]. Patient given information for orange card, walk in times, and medication to receive once he completes paperwork. Patient able to verbalize understanding and follow up along with importance of taking medications in effort to decrease hospitalizations.  Lane Hacker, MSW Clinical Social Work: Emergency Room [phone]

## 2014-11-17 NOTE — ED Notes (Signed)
Pt has 2-3 plus lower leg swelling. States he stands all the time at work. States his legs are painful worse when walking

## 2014-11-17 NOTE — Discharge Instructions (Signed)
Rivaroxaban (Xarelto) ED Discharge Instructions  ° °Patient received a prescription for  °Xarelto 15 & 20 mg - 51 tablet VTE STARTER PACK. °  °Patient understands only the FIRST 30 DAYS OF TREATMENT  °will be provided by the starter pack. °  °Patient understands to contact primary care doctor or ED immediately if for any reason is unable to fill the starter pack prescription. ° °Patient must schedule a follow-up appointment with primary care doctor within 15 days of discharge in order to receive the maintenance prescription and clinical follow up. ° °Patient has received an education kit containing ([facility] Trial Offer Card, DVT/PE brochure, Dosing Diary, and Xarelto Medication Guide).  ° °If not performed in the ED, patient will receive medication counseling by a [facility] pharmacist via phone follow-up within the next 72 hours. Pharmacist to review signs and symptoms of bleeding and proper use of this medication.  ° °Call 911 or return immediately to the nearest ED if you develop bleeding (e.g. nose, gums, vomit, urine, bloody or dark stools), unusual bruising, head trauma (even if minor), severe headache, altered mental status, change in speech, weakness on one side of body, shortness of breath, swollen lips/tongue/face/neck, chest pain, or other concerns. °   °Information on my medicine - XARELTO® (rivaroxaban)  °This medication education was provided to me or my healthcare representative as part of my discharge instructions. °  °WHY WAS XARELTO® PRESCRIBED FOR YOU?  °Xarelto® was prescribed to treat blood clots that may have been found in the veins of your legs (deep vein thrombosis) or in your lungs (pulmonary embolism) and to reduce the risk of them occurring again. °  °WHAT DO YOU NEED TO KNOW ABOUT XARELTO®?  °The starting dose is one 15 mg tablet taken TWICE daily with food for the FIRST 21 DAYS then on day 22 the dose is changed to one 20 mg tablet taken ONCE A DAY with your evening meal. °  °DO NOT  stop taking Xarelto® without talking to the health care provider who prescribed the medication. Refill your prescription for 20 mg tablets before you run out.  °After discharge, you should have regular check-up appointments with your healthcare provider that is prescribing your Xarelto®. In the future your dose may need to be changed if your kidney function changes by a significant amount.  ° °WHAT DO YOU DO IF YOU MISS A DOSE?  °If you are taking Xarelto® TWICE DAILY and you miss a dose, take it as soon as you remember. You may take two 15 mg tablets (total 30 mg) at the same time then resume your regularly scheduled 15 mg twice daily the next day. °  °If you are taking Xarelto® ONCE DAILY and you miss a dose, take it as soon as you remember on the same day then continue your regularly scheduled once daily regimen the next day. Do not take two doses of Xarelto® at the same time. °  °IMPORTANT SAFETY INFORMATION  °Xarelto® is a blood thinner medicine that can cause bleeding. You should call your healthcare provider right away if you experience any of the following:  °-  Bleeding from an injury or your nose that does not stop.  °-  Unusual colored urine (red or dark brown) or unusual colored stools (red or black).  °-  Unusual bruising for unknown reasons.  °-  A serious fall or if you hit your head (even if there is no bleeding).  ° °Some medicines may interact with Xarelto® and   might increase your risk of bleeding while on Xarelto®. To help avoid this, consult your healthcare provider or pharmacist prior to using any new prescription or non-prescription medications, including herbals, vitamins, non-steroidal anti-inflammatory drugs (NSAIDs) and supplements.  ° °This website has more information on Xarelto®: www.xarelto.com. ° °

## 2014-12-08 ENCOUNTER — Ambulatory Visit: Payer: Self-pay | Attending: Internal Medicine | Admitting: Internal Medicine

## 2014-12-08 ENCOUNTER — Encounter: Payer: Self-pay | Admitting: Internal Medicine

## 2014-12-08 VITALS — BP 119/87 | HR 80 | Temp 98.0°F | Resp 16 | Wt 268.2 lb

## 2014-12-08 DIAGNOSIS — Z833 Family history of diabetes mellitus: Secondary | ICD-10-CM

## 2014-12-08 DIAGNOSIS — I82503 Chronic embolism and thrombosis of unspecified deep veins of lower extremity, bilateral: Secondary | ICD-10-CM

## 2014-12-08 DIAGNOSIS — M79606 Pain in leg, unspecified: Secondary | ICD-10-CM

## 2014-12-08 DIAGNOSIS — Z7901 Long term (current) use of anticoagulants: Secondary | ICD-10-CM | POA: Insufficient documentation

## 2014-12-08 DIAGNOSIS — I82509 Chronic embolism and thrombosis of unspecified deep veins of unspecified lower extremity: Secondary | ICD-10-CM | POA: Insufficient documentation

## 2014-12-08 DIAGNOSIS — Z139 Encounter for screening, unspecified: Secondary | ICD-10-CM

## 2014-12-08 LAB — COMPLETE METABOLIC PANEL WITH GFR
ALBUMIN: 4.1 g/dL (ref 3.5–5.2)
ALT: 33 U/L (ref 0–53)
AST: 10 U/L (ref 0–37)
Alkaline Phosphatase: 55 U/L (ref 39–117)
BUN: 13 mg/dL (ref 6–23)
CO2: 28 meq/L (ref 19–32)
Calcium: 9.5 mg/dL (ref 8.4–10.5)
Chloride: 102 mEq/L (ref 96–112)
Creat: 1.04 mg/dL (ref 0.50–1.35)
GFR, Est African American: >89 mL/min
GFR, Est Non African American: 86 mL/min
GLUCOSE: 115 mg/dL — AB (ref 70–99)
Potassium: 5 mEq/L (ref 3.5–5.3)
SODIUM: 138 meq/L (ref 135–145)
TOTAL PROTEIN: 6.8 g/dL (ref 6.0–8.3)
Total Bilirubin: 0.7 mg/dL (ref 0.2–1.2)

## 2014-12-08 LAB — CBC WITH DIFFERENTIAL/PLATELET
BASOS PCT: 0 % (ref 0–1)
Basophils Absolute: 0 10*3/uL (ref 0.0–0.1)
Eosinophils Absolute: 0.1 10*3/uL (ref 0.0–0.7)
Eosinophils Relative: 1 % (ref 0–5)
HCT: 50.3 % (ref 39.0–52.0)
Hemoglobin: 17.4 g/dL — ABNORMAL HIGH (ref 13.0–17.0)
LYMPHS PCT: 25 % (ref 12–46)
Lymphs Abs: 3.2 10*3/uL (ref 0.7–4.0)
MCH: 28.9 pg (ref 26.0–34.0)
MCHC: 34.6 g/dL (ref 30.0–36.0)
MCV: 83.4 fL (ref 78.0–100.0)
MONO ABS: 0.9 10*3/uL (ref 0.1–1.0)
MPV: 10.8 fL (ref 8.6–12.4)
Monocytes Relative: 7 % (ref 3–12)
Neutro Abs: 8.6 10*3/uL — ABNORMAL HIGH (ref 1.7–7.7)
Neutrophils Relative %: 67 % (ref 43–77)
PLATELETS: 191 10*3/uL (ref 150–400)
RBC: 6.03 MIL/uL — AB (ref 4.22–5.81)
RDW: 14.3 % (ref 11.5–15.5)
WBC: 12.8 10*3/uL — ABNORMAL HIGH (ref 4.0–10.5)

## 2014-12-08 LAB — LIPID PANEL
CHOL/HDL RATIO: 4.6 ratio
Cholesterol: 182 mg/dL (ref 0–200)
HDL: 40 mg/dL (ref >=40)
LDL Cholesterol: 118 mg/dL — ABNORMAL HIGH (ref 0–99)
Triglycerides: 121 mg/dL (ref <150)
VLDL: 24 mg/dL (ref 0–40)

## 2014-12-08 LAB — TSH: TSH: 0.747 u[IU]/mL (ref 0.350–4.500)

## 2014-12-08 MED ORDER — GABAPENTIN 100 MG PO CAPS: 100.0000 mg | ORAL_CAPSULE | Freq: Three times a day (TID) | ORAL | Status: DC

## 2014-12-08 MED ORDER — RIVAROXABAN 20 MG PO TABS: 20.0000 mg | ORAL_TABLET | Freq: Every day | ORAL | Status: DC

## 2014-12-08 NOTE — Progress Notes (Signed)
Patient here to establish care Currently on xarelto due to clots in both legs Has one more week on 15mg  xarelto than increased to 20mg 

## 2014-12-08 NOTE — Progress Notes (Signed)
Patient Demographics  David CouncilmanKeith Slaght, is a 44 y.o. male  ION:[id number]  KGM:[id number]  DOB - May 04, 1971  CC:  Chief Complaint  Patient presents with  . Establish Care       HPI: David Dunlap is a 44 y.o. male here today to establish medical care. 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 Patient was restarted on Xarelto. Patient has been taking pain medication when necessary for leg pain have advised patient to avoid NSAIDs. Patient denies any family history of DVTs. Patient has No headache, No chest pain, No abdominal pain - No Nausea, No new weakness tingling or numbness, No Cough - SOB.  No Known Allergies Past Medical History  Diagnosis Date  . DVT (deep venous thrombosis)    Current Outpatient Prescriptions on File Prior to Visit  Medication Sig Dispense Refill  . acetaminophen (TYLENOL) 325 MG tablet Take 650 mg by mouth every 6 (six) hours as needed for mild pain.    Marland Kitchen. oxyCODONE-acetaminophen (PERCOCET/ROXICET) 5-325 MG per tablet Take 1 tablet by mouth every 6 (six) hours as needed for moderate pain or severe pain. 30 tablet 0  . XARELTO STARTER PACK 15 & 20 MG TBPK Take 15-20 mg by mouth as directed. Take as directed on package: Start with one 15mg  tablet by mouth twice a day with food. On Day 22, switch to one 20mg  tablet once a day with food. 51 each 0   No current facility-administered medications on file prior to visit.   Family History  Problem Relation Age of Onset  . Diabetes Mother   . Diabetes Father   . Hypertension Sister    History    Social History  . Marital Status: Legally Separated    Spouse Name: N/A  . Number of Children: N/A  . Years of Education: N/A   Occupational History  . Not on file.   Social History Main Topics  . Smoking status: Never Smoker   . Smokeless tobacco: Not on file  . Alcohol Use: 0.0 oz/week    0 Standard drinks or equivalent per week  . Drug Use: Not on file  . Sexual Activity: Not on file   Other Topics Concern  . Not on file   Social History Narrative    Review of Systems: Constitutional: Negative for fever, chills, diaphoresis, activity change, appetite change and fatigue. HENT: Negative for ear pain, nosebleeds, congestion, facial swelling, rhinorrhea, neck pain, neck stiffness and ear discharge.  Eyes: Negative for pain, discharge, redness, itching and visual disturbance. Respiratory: Negative for cough, choking, chest tightness, shortness of breath, wheezing and stridor.  Cardiovascular: Negative for chest pain, palpitations and leg swelling. Gastrointestinal: Negative for abdominal distention. Genitourinary: Negative for dysuria, urgency, frequency, hematuria, flank pain, decreased urine volume, difficulty urinating and dyspareunia.  Musculoskeletal: Negative for back pain, joint swelling, arthralgia and gait problem. Neurological: Negative for dizziness, tremors, seizures, syncope, facial asymmetry, speech difficulty, weakness, light-headedness, numbness and headaches.  Hematological: Negative for adenopathy. Does not bruise/bleed easily. Psychiatric/Behavioral: Negative for hallucinations, behavioral problems, confusion, dysphoric mood, decreased concentration and agitation.    Objective:  Filed Vitals:   12/08/14 0910  BP: 119/87  Pulse: 80  Temp: 98 F (36.7 C)  Resp: 16    Physical Exam: Constitutional: Patient appears well-developed and well-nourished. No distress. HENT: Normocephalic, atraumatic, External right and left ear normal. Oropharynx is clear  and moist.  Eyes: Conjunctivae and EOM are normal. PERRLA, no scleral icterus. Neck: Normal ROM. Neck supple. No JVD. No tracheal deviation. No thyromegaly. CVS: RRR, S1/S2 +, no murmurs, no gallops, no carotid bruit.  Pulmonary: Effort and breath sounds normal, no stridor, rhonchi, wheezes, rales.  Abdominal: Soft. BS +, no distension, tenderness, rebound or guarding.  Musculoskeletal: Normal range of motion. Trace pedal edema and no tenderness.  Neuro: Alert. Normal reflexes, muscle tone coordination. No cranial nerve deficit. Skin: Skin is warm and dry. No rash noted. Not diaphoretic. No erythema. No pallor. Psychiatric: Normal mood and affect. Behavior, judgment, thought content normal.  Lab Results  Component Value Date   WBC 13.8* 11/16/2014   HGB 16.4 11/16/2014   HCT 45.3 11/16/2014   MCV 81.8 11/16/2014   PLT 191 11/16/2014   Lab Results  Component Value Date   CREATININE 1.23 11/16/2014   BUN 15 11/16/2014   NA 133* 11/16/2014   K 4.3 11/16/2014   CL 102 11/16/2014   CO2 26 11/16/2014    No results found for: HGBA1C Lipid Panel  No results found for: CHOL, TRIG, HDL, CHOLHDL, VLDL, LDLCALC     Assessment and plan:   1. 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  - rivaroxaban (XARELTO) 20 MG TABS tablet; Take 1 tablet (20 mg total) by mouth daily with supper.  Dispense: 30 tablet; Refill: 6  2. Family history of diabetes mellitus (DM) Will check - COMPLETE METABOLIC PANEL WITH GFR - Hemoglobin A1c  3. Pain of lower extremity, unspecified laterality Advised patient to avoid NSAIDs, prescribed Neurontin - gabapentin (NEURONTIN) 100 MG capsule; Take 1 capsule (100 mg total) by mouth 3 (three) times daily.  Dispense: 90 capsule; Refill: 3  4. Screening Ordered baseline blood work -  COMPLETE METABOLIC PANEL WITH GFR - TSH - Lipid panel - Vit D  25 hydroxy (rtn osteoporosis monitoring) - CBC with Differential/Platelet   Return in about 3 months (around 03/10/2015), or if symptoms worsen or fail to improve.    The patient was given clear instructions to go to ER or return to medical center if symptoms don't improve, worsen or new problems develop. The patient verbalized understanding. The patient was told to call to get lab results if they haven't heard anything in the next week.    This note has been created with Education officer, environmental. Any transcriptional errors are unintentional.   Doris Cheadle, MD

## 2014-12-09 LAB — HEMOGLOBIN A1C
HEMOGLOBIN A1C: 5.7 % — AB (ref <5.7)
MEAN PLASMA GLUCOSE: 117 mg/dL — AB (ref <117)

## 2014-12-09 LAB — VITAMIN D 25 HYDROXY (VIT D DEFICIENCY, FRACTURES): Vit D, 25-Hydroxy: 7 ng/mL — ABNORMAL LOW (ref 30–100)

## 2014-12-13 ENCOUNTER — Telehealth: Payer: Self-pay

## 2014-12-13 MED ORDER — VITAMIN D (ERGOCALCIFEROL) 1.25 MG (50000 UNIT) PO CAPS: 50000.0000 [IU] | ORAL_CAPSULE | ORAL | Status: DC

## 2014-12-13 NOTE — Telephone Encounter (Signed)
Patient not available Left message on voice mail to return our call 

## 2014-12-13 NOTE — Telephone Encounter (Signed)
-----   Message from Doris Cheadleeepak Advani, MD sent at 12/09/2014  1:00 PM EDT ----- Blood work reviewed, noticed low vitamin D, call patient advise to start ergocalciferol 50,000 units once a week for the duration of  12 weeks.  noticed hemoglobin A1c of 5.7%, patient has prediabetes, call and advise patient for low carbohydrate diet. Also noted elevated WBC count which is chronically elevated, has improved now, will repeat  test on the following visit

## 2014-12-17 ENCOUNTER — Encounter: Payer: Self-pay | Admitting: Emergency Medicine

## 2014-12-17 ENCOUNTER — Emergency Department (INDEPENDENT_AMBULATORY_CARE_PROVIDER_SITE_OTHER): Payer: No Typology Code available for payment source

## 2014-12-17 ENCOUNTER — Emergency Department (INDEPENDENT_AMBULATORY_CARE_PROVIDER_SITE_OTHER)
Admission: EM | Admit: 2014-12-17 | Discharge: 2014-12-17 | Disposition: A | Discharge status: Home / Self Care | Payer: No Typology Code available for payment source | Source: Home / Self Care | Attending: Family Medicine | Admitting: Family Medicine

## 2014-12-17 ENCOUNTER — Encounter (HOSPITAL_COMMUNITY): Payer: Self-pay

## 2014-12-17 DIAGNOSIS — S82891A Other fracture of right lower leg, initial encounter for closed fracture: Secondary | ICD-10-CM

## 2014-12-17 MED ORDER — TRAMADOL HCL 50 MG PO TABS: 50.0000 mg | ORAL_TABLET | Freq: Four times a day (QID) | ORAL | Status: DC | PRN

## 2014-12-17 NOTE — ED Notes (Signed)
C/o pain in right ankle after twisted it earlier today. Swelling noted

## 2014-12-17 NOTE — ED Provider Notes (Signed)
CSN: [id number]     Arrival date & time 12/17/14  1536 History   First MD Initiated Contact with Patient 12/17/14 1704     Chief Complaint  Patient presents with  . Ankle Injury   (Consider location/radiation/quality/duration/timing/severity/associated sxs/prior Treatment) HPI  Right ankle injury. Patient reports stepping out of his front door this morning and missed the first step causing his ankle to roll. Felt a popping sensation immediately tender. Immediately started swell. Took a Percocet for the pain with some relief. Pain and swelling are constant. Denies previous trauma.  Past Medical History  Diagnosis Date  . DVT (deep venous thrombosis)    Past Surgical History  Procedure Laterality Date  . Ligament repair      R forearm   Family History  Problem Relation Age of Onset  . Diabetes Mother   . Diabetes Father   . Hypertension Sister    History  Substance Use Topics  . Smoking status: Never Smoker   . Smokeless tobacco: Not on file  . Alcohol Use: 0.0 oz/week    0 Standard drinks or equivalent per week    Review of Systems Per HPI with all other pertinent systems negative.   Allergies  Review of patient's allergies indicates no known allergies.  Home Medications   Prior to Admission medications   Medication Sig Start Date End Date Taking? Authorizing Provider  rivaroxaban (XARELTO) 20 MG TABS tablet Take 1 tablet (20 mg total) by mouth daily with supper. 12/08/14  Yes Doris Cheadle, MD  acetaminophen (TYLENOL) 325 MG tablet Take 650 mg by mouth every 6 (six) hours as needed for mild pain.    Historical Provider, MD  gabapentin (NEURONTIN) 100 MG capsule Take 1 capsule (100 mg total) by mouth 3 (three) times daily. 12/08/14   Doris Cheadle, MD  oxyCODONE-acetaminophen (PERCOCET/ROXICET) 5-325 MG per tablet Take 1 tablet by mouth every 6 (six) hours as needed for moderate pain or severe pain. 11/17/14   Linwood Dibbles, MD  traMADol (ULTRAM) 50 MG tablet Take 1 tablet (50  mg total) by mouth every 6 (six) hours as needed. 12/17/14   Ozella Rocks, MD  Vitamin D, Ergocalciferol, (DRISDOL) 50000 UNITS CAPS capsule Take 1 capsule (50,000 Units total) by mouth every 7 (seven) days. 12/13/14   Doris Cheadle, MD  XARELTO STARTER PACK 15 & 20 MG TBPK Take 15-20 mg by mouth as directed. Take as directed on package: Start with one  tablet by mouth twice a day with food. On Day 22, switch to one  tablet once a day with food. 11/17/14   Linwood Dibbles, MD   BP 135/92 mmHg  Pulse 72  Temp(Src) 98.3 F (36.8 C) (Oral)  Resp 20  SpO2 95% Physical Exam Physical Exam  Constitutional: oriented to person, place, and time. appears well-developed and well-nourished. No distress.  HENT:  Head: Normocephalic and atraumatic.  Eyes: EOMI. PERRL.  Neck: Normal range of motion.  Cardiovascular: RRR, no m/r/g, 2+ distal pulses,  Pulmonary/Chest: Effort normal and breath sounds normal. No respiratory distress.  Abdominal: Soft. Bowel sounds are normal. NonTTP, no distension.  Musculoskeletal: Exquisitely tender right ankle with lateral and medial malleoli are tenderness on palpation. No ankle instability. Warm with 2+ pulses. Range of motion limited mildly limited secondary to pain.  Neurological: alert and oriented to person, place, and time.  Skin: Skin is warm. No rash noted. non diaphoretic.  Psychiatric: normal mood and affect. behavior is normal. Judgment and thought content normal.  ED Course  Procedures (including critical care time) Labs Review Labs Reviewed - No data to display  Imaging Review Dg Ankle Complete Right  12/17/2014   CLINICAL DATA:  Twisted ankle stepping out of door. Bilateral ankle pain and swelling. Initial encounter.  EXAM: RIGHT ANKLE - COMPLETE 3+ VIEW  COMPARISON:  09/23/2009  FINDINGS: Diffuse soft tissue swelling is noted. There is no evidence of acute fracture or dislocation. No evidence of ankle joint effusion.  Well corticated ossific densities  are again seen along the inferior aspects of the medial and lateral malleolar like, consistent with old avulsion injuries. A large plantar calcaneal bone spur is again noted.  IMPRESSION: Diffuse soft tissue swelling. No evidence of fracture, dislocation, or ankle joint effusion.   Electronically Signed   By: Myles RosenthalJohn  Stahl M.D.   On: 12/17/2014 17:30     MDM   1. Closed right ankle fracture, initial encounter    Cam Walker boot, tramadol when necessary, follow-up orthopedics next week.  Precautions given and all questions answered  Shelly Flattenavid Albena Comes, MD Family Medicine 12/17/2014, 5:57 PM      Ozella Rocksavid J Toneka Fullen, MD 12/17/14 [phone]

## 2014-12-17 NOTE — Progress Notes (Signed)
Patient ID: David Dunlap, male   DOB: 07/21/1971, 44 y.o.   MRN: [id number] Pt comes in with c/o left foot medial swelling and pain after possible rolled ankle States he rolled ankle while stepping off curb. Pt instructed to go directly to Urgent Care for xray/evaluation No pain medication or treatment used  VSS- Nonnie DoneJill Aalivia Mcgraw

## 2014-12-17 NOTE — Discharge Instructions (Signed)
You likely fractured your ankle. Please call the orthopedic surgeon next week (monday) for an appointment Please wear the boot until your appointment.  Please use the tramadol only for extreme pain.

## 2014-12-27 ENCOUNTER — Other Ambulatory Visit: Payer: Self-pay

## 2014-12-27 DIAGNOSIS — I82503 Chronic embolism and thrombosis of unspecified deep veins of lower extremity, bilateral: Secondary | ICD-10-CM

## 2014-12-27 MED ORDER — RIVAROXABAN 20 MG PO TABS: 20.0000 mg | ORAL_TABLET | Freq: Every day | ORAL | Status: DC

## 2015-01-24 ENCOUNTER — Encounter: Payer: Self-pay | Admitting: Internal Medicine

## 2015-01-24 ENCOUNTER — Ambulatory Visit: Payer: No Typology Code available for payment source | Attending: Internal Medicine | Admitting: Internal Medicine

## 2015-01-24 VITALS — BP 120/86 | HR 73 | Temp 98.0°F | Resp 16 | Wt 272.4 lb

## 2015-01-24 DIAGNOSIS — M25571 Pain in right ankle and joints of right foot: Secondary | ICD-10-CM | POA: Insufficient documentation

## 2015-01-24 NOTE — Progress Notes (Signed)
Patient states here for follow up to his broken ankle and for a note to  Return to work

## 2015-01-24 NOTE — Progress Notes (Signed)
MRN: [id number] Name: David Dunlap  Sex: male Age: 44 y.o. DOB: 1971/03/22  Allergies: Review of patient's allergies indicates no known allergies.  Chief Complaint  Patient presents with  . Follow-up    HPI: Patient is 44 y.o. male who has history of DVT and is on Xarelto, last month he went to the urgent care with right ankle pain after rest head injury, EMR reviewed patient had x-ray done which reportedDiffuse soft tissue swelling is noted. There is no evidence of acute fracture or dislocation. No evidence of ankle joint effusion.  Well corticated ossific densities are again seen along the inferior aspects of the medial and lateral malleolar like, consistent with old avulsion injuries. A large plantar calcaneal bone spur is again noted.  Patient was given pain medication and was given Cam Walker Boot and was advised to schedule appointment with orthopedics, as per patient he never saw the orthopedist his symptoms got improved today he wants a letter to go back to work. Patient denies any more pain.  Past Medical History  Diagnosis Date  . DVT (deep venous thrombosis)     Past Surgical History  Procedure Laterality Date  . Ligament repair      R forearm      Medication List       This list is accurate as of: 01/24/15  1:27 PM.  Always use your most recent med list.               acetaminophen 325 MG tablet  Commonly known as:  TYLENOL  Take 650 mg by mouth every 6 (six) hours as needed for mild pain.     gabapentin 100 MG capsule  Commonly known as:  NEURONTIN  Take 1 capsule (100 mg total) by mouth 3 (three) times daily.     oxyCODONE-acetaminophen 5-325 MG per tablet  Commonly known as:  PERCOCET/ROXICET  Take 1 tablet by mouth every 6 (six) hours as needed for moderate pain or severe pain.     traMADol 50 MG tablet  Commonly known as:  ULTRAM  Take 1 tablet (50 mg total) by mouth every 6 (six) hours as needed.     Vitamin D (Ergocalciferol)  50000 UNITS Caps capsule  Commonly known as:  DRISDOL  Take 1 capsule (50,000 Units total) by mouth every 7 (seven) days.     XARELTO STARTER PACK 15 & 20 MG Tbpk  Generic drug:  Rivaroxaban  Take 15-20 mg by mouth as directed. Take as directed on package: Start with one 15mg  tablet by mouth twice a day with food. On Day 22, switch to one 20mg  tablet once a day with food.     rivaroxaban 20 MG Tabs tablet  Commonly known as:  XARELTO  Take 1 tablet (20 mg total) by mouth daily with supper.        No orders of the defined types were placed in this encounter.    Immunization History  Administered Date(s) Administered  . Influenza Whole 10/12/2010  . Tdap 08/07/2011    Family History  Problem Relation Age of Onset  . Diabetes Mother   . Diabetes Father   . Hypertension Sister     History  Substance Use Topics  . Smoking status: Never Smoker   . Smokeless tobacco: Not on file  . Alcohol Use: 0.0 oz/week    0 Standard drinks or equivalent per week    Review of Systems   As noted in HPI  Filed Vitals:  01/24/15 1107  BP: 120/86  Pulse: 73  Temp: 98 F (36.7 C)  Resp: 16    Physical Exam  Physical Exam  Constitutional: No distress.  Eyes: EOM are normal. Pupils are equal, round, and reactive to light.  Cardiovascular: Normal rate and regular rhythm.   Pulmonary/Chest: Breath sounds normal. No respiratory distress. He has no wheezes. He has no rales.  Musculoskeletal:  Right ankle trace edema no tenderness, good range of motion, 2+ tonsils pedis pulse    CBC    Component Value Date/Time   WBC 12.8* 12/08/2014 0942   RBC 6.03* 12/08/2014 0942   HGB 17.4* 12/08/2014 0942   HCT 50.3 12/08/2014 0942   PLT 191 12/08/2014 0942   MCV 83.4 12/08/2014 0942   LYMPHSABS 3.2 12/08/2014 0942   MONOABS 0.9 12/08/2014 0942   EOSABS 0.1 12/08/2014 0942   BASOSABS 0.0 12/08/2014 0942    CMP     Component Value Date/Time   NA 138 12/08/2014 0942   K 5.0  12/08/2014 0942   CL 102 12/08/2014 0942   CO2 28 12/08/2014 0942   GLUCOSE 115* 12/08/2014 0942   BUN 13 12/08/2014 0942   CREATININE 1.04 12/08/2014 0942   CREATININE 1.23 11/16/2014 2356   CALCIUM 9.5 12/08/2014 0942   PROT 6.8 12/08/2014 0942   ALBUMIN 4.1 12/08/2014 0942   AST 10 12/08/2014 0942   ALT 33 12/08/2014 0942   ALKPHOS 55 12/08/2014 0942   BILITOT 0.7 12/08/2014 0942   GFRNONAA 86 12/08/2014 0942   GFRNONAA 69* 11/16/2014 2356   GFRAA >89 12/08/2014 0942   GFRAA 80* 11/16/2014 2356    Lab Results  Component Value Date/Time   CHOL 182 12/08/2014 09:42 AM    Lab Results  Component Value Date/Time   HGBA1C 5.7* 12/08/2014 09:42 AM    Lab Results  Component Value Date/Time   AST 10 12/08/2014 09:42 AM    Assessment and Plan  Pain in joint, ankle and foot, right Patient symptomatically improved pain medication when necessary. Follow up as scheduled.   Return in about 3 months (around 04/26/2015), or if symptoms worsen or fail to improve.   This note has been created with Education officer, environmentalDragon speech recognition software and smart phrase technology. Any transcriptional errors are unintentional.    Doris CheadleADVANI, David Dunlow, MD

## 2015-05-17 ENCOUNTER — Emergency Department (HOSPITAL_COMMUNITY)
Admission: EM | Admit: 2015-05-17 | Discharge: 2015-05-17 | Disposition: A | Discharge status: Home / Self Care | Payer: No Typology Code available for payment source | Attending: Emergency Medicine | Admitting: Emergency Medicine

## 2015-05-17 ENCOUNTER — Encounter (HOSPITAL_COMMUNITY): Payer: Self-pay | Admitting: Emergency Medicine

## 2015-05-17 DIAGNOSIS — Z86718 Personal history of other venous thrombosis and embolism: Secondary | ICD-10-CM | POA: Insufficient documentation

## 2015-05-17 DIAGNOSIS — K029 Dental caries, unspecified: Secondary | ICD-10-CM | POA: Insufficient documentation

## 2015-05-17 DIAGNOSIS — K0381 Cracked tooth: Secondary | ICD-10-CM | POA: Insufficient documentation

## 2015-05-17 DIAGNOSIS — K088 Other specified disorders of teeth and supporting structures: Secondary | ICD-10-CM | POA: Insufficient documentation

## 2015-05-17 DIAGNOSIS — K0889 Other specified disorders of teeth and supporting structures: Secondary | ICD-10-CM

## 2015-05-17 DIAGNOSIS — Z79899 Other long term (current) drug therapy: Secondary | ICD-10-CM | POA: Insufficient documentation

## 2015-05-17 MED ORDER — PENICILLIN V POTASSIUM 500 MG PO TABS: 500.0000 mg | ORAL_TABLET | Freq: Four times a day (QID) | ORAL | Status: AC

## 2015-05-17 MED ORDER — HYDROCODONE-ACETAMINOPHEN 5-325 MG PO TABS: 2.0000 | ORAL_TABLET | ORAL | Status: DC | PRN

## 2015-05-17 MED ORDER — HYDROCODONE-ACETAMINOPHEN 5-325 MG PO TABS
1.0000 | ORAL_TABLET | Freq: Once | ORAL | Status: AC
  Administered 2015-05-17: 1 via ORAL
  Filled 2015-05-17: qty 1

## 2015-05-17 NOTE — Discharge Instructions (Signed)
Dental Pain °A tooth ache may be caused by cavities (tooth decay). Cavities expose the nerve of the tooth to air and hot or cold temperatures. It may come from an infection or abscess (also called a boil or furuncle) around your tooth. It is also often caused by dental caries (tooth decay). This causes the pain you are having. °DIAGNOSIS  °Your caregiver can diagnose this problem by exam. °TREATMENT  °· If caused by an infection, it may be treated with medications which kill germs (antibiotics) and pain medications as prescribed by your caregiver. Take medications as directed. °· Only take over-the-counter or prescription medicines for pain, discomfort, or fever as directed by your caregiver. °· Whether the tooth ache today is caused by infection or dental disease, you should see your dentist as soon as possible for further care. °SEEK MEDICAL CARE IF: °The exam and treatment you received today has been provided on an emergency basis only. This is not a substitute for complete medical or dental care. If your problem worsens or new problems (symptoms) appear, and you are unable to meet with your dentist, call or return to this location. °SEEK IMMEDIATE MEDICAL CARE IF:  °· You have a fever. °· You develop redness and swelling of your face, jaw, or neck. °· You are unable to open your mouth. °· You have severe pain uncontrolled by pain medicine. °MAKE SURE YOU:  °· Understand these instructions. °· Will watch your condition. °· Will get help right away if you are not doing well or get worse. °Document Released: 09/03/2005 Document Revised: 11/26/2011 Document Reviewed: 04/21/2008 °ExitCare® Patient Information ©2015 [employer]. This information is not intended to replace advice given to you by your health care provider. Make sure you discuss any questions you have with your health care provider. ° °Dental Care and Dentist Visits °Dental care supports good overall health. Regular dental visits can also help you  avoid dental pain, bleeding, infection, and other more serious health problems in the future. It is important to keep the mouth healthy because diseases in the teeth, gums, and other oral tissues can spread to other areas of the body. Some problems, such as diabetes, heart disease, and pre-term labor have been associated with poor oral health.  °See your dentist every 6 months. If you experience emergency problems such as a toothache or broken tooth, go to the dentist right away. If you see your dentist regularly, you may catch problems early. It is easier to be treated for problems in the early stages.  °WHAT TO EXPECT AT A DENTIST VISIT  °Your dentist will look for many common oral health problems and recommend proper treatment. At your regular dental visit, you can expect: °· Gentle cleaning of the teeth and gums. This includes scraping and polishing. This helps to remove the sticky substance around the teeth and gums (plaque). Plaque forms in the mouth shortly after eating. Over time, plaque hardens on the teeth as tartar. If tartar is not removed regularly, it can cause problems. Cleaning also helps remove stains. °· Periodic X-rays. These pictures of the teeth and supporting bone will help your dentist assess the health of your teeth. °· Periodic fluoride treatments. Fluoride is a natural mineral shown to help strengthen teeth. Fluoride treatment involves applying a fluoride gel or varnish to the teeth. It is most commonly done in children. °· Examination of the mouth, tongue, jaws, teeth, and gums to look for any oral health problems, such as: °¨ Cavities (dental caries). This is   decay on the tooth caused by plaque, sugar, and acid in the mouth. It is best to catch a cavity when it is small.  Inflammation of the gums caused by plaque buildup (gingivitis).  Problems with the mouth or malformed or misaligned teeth.  Oral cancer or other diseases of the soft tissues or jaws. KEEP YOUR TEETH AND GUMS  HEALTHY For healthy teeth and gums, follow these general guidelines as well as your dentist's specific advice:  Have your teeth professionally cleaned at the dentist every 6 months.  Brush twice daily with a fluoride toothpaste.  Floss your teeth daily.  Ask your dentist if you need fluoride supplements, treatments, or fluoride toothpaste.  Eat a healthy diet. Reduce foods and drinks with added sugar.  Avoid smoking. TREATMENT FOR ORAL HEALTH PROBLEMS If you have oral health problems, treatment varies depending on the conditions present in your teeth and gums.  Your caregiver will most likely recommend good oral hygiene at each visit.  For cavities, gingivitis, or other oral health disease, your caregiver will perform a procedure to treat the problem. This is typically done at a separate appointment. Sometimes your caregiver will refer you to another dental specialist for specific tooth problems or for surgery. SEEK IMMEDIATE DENTAL CARE IF:  You have pain, bleeding, or soreness in the gum, tooth, jaw, or mouth area.  A permanent tooth becomes loose or separated from the gum socket.  You experience a blow or injury to the mouth or jaw area. Document Released: 05/16/2011 Document Revised: 11/26/2011 Document Reviewed: 05/16/2011 [facility] Patient Information 2015 [place], [place]. This information is not intended to replace advice given to you by your health care provider. Make sure you discuss any questions you have with your health care provider.  Follow up with dentist for tooth extraction within 1 week. Return if fevers, chills, painful swallowing occur.    Emergency Department Resource Guide 1) Find a Doctor and Pay Out of Pocket Although you won't have to find out who is covered by your insurance plan, it is a good idea to ask around and get recommendations. You will then need to call the office and see if the doctor you have chosen will accept you as a new patient and what  types of options they offer for patients who are self-pay. Some doctors offer discounts or will set up payment plans for their patients who do not have insurance, but you will need to ask so you aren't surprised when you get to your appointment.  2) Contact Your Local Health Department Not all health departments have doctors that can see patients for sick visits, but many do, so it is worth a call to see if yours does. If you don't know where your local health department is, you can check in your phone book. The CDC also has a tool to help you locate your state's health department, and many state websites also have listings of all of their local health departments.  3) Find a Walk-in Clinic If your illness is not likely to be very severe or complicated, you may want to try a walk in clinic. These are popping up all over the country in pharmacies, drugstores, and shopping centers. They're usually staffed by nurse practitioners or physician assistants that have been trained to treat common illnesses and complaints. They're usually fairly quick and inexpensive. However, if you have serious medical issues or chronic medical problems, these are probably not your best option.  No Primary Care Doctor: - Call  Health Connect at  [phone] - they can help you locate a primary care doctor that  accepts your insurance, provides certain services, etc. - Physician Referral Service- [phone]  Chronic Pain Problems: Organization         Address  Phone   Notes  [facility]  [phone] Patients need to be referred by their primary care doctor.   Medication Assistance: Organization         Address  Phone   Notes  [facility] Medication [facility] [address] [place] [place], [place] [address] [phone] --Must be a resident of [facility] -- Must have NO insurance coverage whatsoever (no Medicaid/ Medicare, etc.) -- The pt. MUST have a primary care doctor that  directs their care regularly and follows them in the community   [facility]ist  [phone]   [employer]  [phone]    Agencies that provide inexpensive medical care: Organization         Address  Phone   Notes  [facility]  [phone]   [facility]    [phone]   [facility] [address] [place], [place] [address] [phone]   [facility] [address] [place] [place] [phone]   Planned Parenthood    [phone]   [facility]    [phone]   [facility]  [address], [place] Phone:  [phone], Fax:  [phone] Hours of Operation:  9 am - 6 pm, M-F.  Also accepts Medicaid/Medicare and self-pay.  [facility]  [address], [place] Phone: [phone], Fax: [phone]. Hours of Operation:  8:30 am - 5:30 pm, M-F.  Also accepts Medicaid and self-pay.  [facility] [place] [address] [place] Phone: [phone]   [facility] [address] Natasha Bence [place], [place] [phone], Ext. [phone] Mondays & Thursdays: 7-9 AM.  First 15 patients are seen on a first come, first serve basis.    Medicaid-accepting [facility] Providers:  Organization         Address  Phone   Notes  [facility] [address], [place] [phone] Also accepts self-pay patients.  [facility] [address] Laurell Josephs [place], [place]  [phone]   [facility] [address], [place] [phone]   [facility] [address], [place] [phone]   Renaye Rakers [address], [place]   [phone] Only accepts [place] Access [place] patients after they have their name applied to their card.   Self-Pay (no insurance) in [facility]:  Organization          Address  Phone   Notes  Sickle Cell Patients, [facility] Internal Medicine [address] [place], [place] [phone]   [facility] Urgent Care [address] [place], [place] [phone]   [facility] [place]  [address], [place] [phone]   [facility]/Dr. Osei-Bonsu  [address], [place] or [address], [place] [phone] Phone number for both [place] and [place] locations is the same.  [facility] [address], [place] [phone]   [facility] [phone]  [address], [place] or [address] [phone]   [facility] [address] [place], [place] [phone], phone; [phone], fax Sees patients 1st and 3rd Saturday of every month.  Must not qualify for public or private insurance (i.e. Medicaid, Medicare, [place] Health Choice, Veterans' Benefits)  Household income should be no more than 200% of the poverty level The clinic cannot treat you if you are pregnant or think you are pregnant  Sexually transmitted diseases are not treated at the clinic.    Dental Care: Organization         Address  Phone  Notes  [facility] of [facility] [address] [place], [place] [phone] Accepts children up to age 27 who are enrolled in [place] or [place] Health Choice; pregnant women with a Medicaid card; and children who have applied for Medicaid or [place] Health Choice, but were declined, whose parents can pay a reduced fee at time of service.  [facility] of [facility]  [address], [place] [phone] Accepts children up to age 59 who are enrolled in [place] or [place] Health Choice; pregnant women with a Medicaid card; and children who have applied for Medicaid or [place] Health Choice, but were declined, whose parents can pay a reduced fee at time of  service.  [facility]  [address] [place], [place] [phone] Patients are seen by appointment only. Walk-ins are not accepted. [facility] will see patients 41 years of age and older. Monday - Tuesday (8am-5pm) Most Wednesdays (8:30-5pm) $30 per visit, cash only  [facility] Adult [facility] Access [facility]  [address], [facility] [phone] Patients are seen by appointment only. Walk-ins are not accepted. [facility] will see patients 54 years of age and older. One Wednesday Evening (Monthly: Volunteer Based).  $30 per visit, cash only  [employer]  [phone] for adults; Children under age 34, call Graduate Pediatric Dentistry at [phone]. Children aged 27-14, please call [phone] to request a pediatric application.  Dental services are provided in all areas of dental care including fillings, crowns and bridges, complete and partial dentures, implants, gum treatment, root canals, and extractions. Preventive care is also provided. Treatment is provided to both adults and children. Patients are selected via a lottery and there is often a waiting list.   [facility] [address] [place]  [phone] [email or web address]   [facility] [address] [place], [place] [phone], Ext. [phone] Second and Fourth Thursday of each month, opens at 6:30 AM; Clinic ends at 9 AM.  Patients are seen on a first-come first-served basis, and a limited number are seen during each clinic.   [facility]  [address] Ether Griffins [place], [place] [phone]   Eligibility Requirements You must have lived in [place], [place], or [place] counties for at least the last three months.   You cannot be eligible for state or federal sponsored [place], including [employer], [place], or [employer].   You generally cannot be eligible for healthcare insurance through your employer.     How to apply: Eligibility screenings are held every Tuesday and Wednesday afternoon from 1:00 pm until 4:00 pm. You do not need an appointment for the interview!  [facility] [address], [place], [place] [phone]   [facility] Department  [phone]   [facility] Health Department  [phone]   [facility] Health Department  [phone]    Behavioral Health Resources in the Community: Intensive Outpatient Programs Organization         Address  Phone  Notes  [facility] [address] [place], [place] [phone]   [facility] [address], [place], [place] [phone]   [facility] [address] [place], [place]  [phone]   [facility] Mental Health [address],  [place], [place] [phone] or [phone]   Substance Abuse Resources Organization         Address  Phone  Notes  [facility]  [phone]   [facility]  [phone]   [facility]  [phone]   Floydene Flock  [phone]   Residential & Outpatient Substance Abuse Program  [phone]   Psychological Services Organization         Address  Phone  Notes  [facility] Behavioral Health  [phone]   [facility]  [phone]   [facility] [address] [place] [phone] or [phone]    Mobile Crisis Teams Organization         Address  Phone  Notes  [facility], Mobile Crisis Care Unit  [phone]   [facility]  [address] [place], [place] [phone]   Doristine Locks [address] [place] [phone]    Self-Help/Support Groups Organization         Address  Phone             Notes  [facility] [place] - variety of support groups  336- I7437963 Call for more information  Narcotics Anonymous (NA), [facility] [address], [employer] [place]  2 meetings at this location   Statistician         Address  Phone  Notes  [facility] [address],    [place]  [phone]   [facility]  [address], [place] [address], [place], [place] [phone]   [facility] [address] [place], [place] [phone] Admissions: 8am-3pm M-F  [facility] [address],    [place], [place] [phone]   [facility] [address] [place], [place], [place] [phone]   [facility] [address]  [place], [place] [phone]   [facility] - Intensive Outpatient [address] Laurell [address], [place], [place] [phone]   [facility] [address] [place]  [place], [place] [phone] or [phone]   [facility] ([facility]) [address], [place], [place] [phone] Accepts Medicaid  [facility] [address],  [place] [phone] Substance Abuse/Addiction Treatment   [facility] Organization         Address  Phone  Notes  [facility]  [phone]   Angie Fava, PhD [address] Ervin Knack [place], [place]   [phone] or [phone]   [facility] Behavioral   [address] [place], [place] [phone]   [facility] [address], [place], [place] [phone] Insurance/Medicaid/sponsorship through [employer] and Families [address]  [place], [place] [phone] [place], [place] [phone]    Dr. Adele Schilder  [phone]   [facility] Dept. 1) [address], [place] 2) [place] 3)  [place] [address], [place] [phone]  [phone]   Center [phone] or [phone] (After Hours)

## 2015-05-17 NOTE — ED Provider Notes (Signed)
CSN: [id number]     Arrival date & time 05/17/15  2034 History   First MD Initiated Contact with Patient 05/17/15 2056     Chief Complaint  Patient presents with  . Dental Pain     (Consider location/radiation/quality/duration/timing/severity/associated sxs/prior Treatment) HPI Comments: David Dunlap is a 44 y.o M who presents today with L upper molar pain that began 5 days ago. Pt states that he woke up with dental pain 5 days ago and it has continued to worsen since then. Pt said that this molar "broke off" a few years ago and he has not seen a dentist for this problem since. Denies fevers, chills, dysphagia, odynophagia, facial swelling, difficult breathing.   Patient is a 44 y.o. male presenting with tooth pain. The history is provided by the patient.  Dental Pain Associated symptoms: no drooling     Past Medical History  Diagnosis Date  . DVT (deep venous thrombosis)    Past Surgical History  Procedure Laterality Date  . Ligament repair      R forearm   Family History  Problem Relation Age of Onset  . Diabetes Mother   . Diabetes Father   . Hypertension Sister    Social History  Substance Use Topics  . Smoking status: Never Smoker   . Smokeless tobacco: None  . Alcohol Use: 0.0 oz/week    0 Standard drinks or equivalent per week    Review of Systems  HENT: Negative for drooling, ear pain and sore throat.   Neurological: Negative for dizziness.  All other systems reviewed and are negative.     Allergies  Review of patient's allergies indicates no known allergies.  Home Medications   Prior to Admission medications   Medication Sig Start Date End Date Taking? Authorizing Provider  acetaminophen (TYLENOL) 325 MG tablet Take 650 mg by mouth every 6 (six) hours as needed for mild pain.    Historical Provider, MD  gabapentin (NEURONTIN) 100 MG capsule Take 1 capsule (100 mg total) by mouth 3 (three) times daily. 12/08/14   Doris Cheadle, MD    HYDROcodone-acetaminophen (NORCO/VICODIN) 5-325 MG per tablet Take 2 tablets by mouth every 4 (four) hours as needed. 05/17/15   Alyssabeth Bruster Tripp Laird Runn[place], PA-C  oxyCODONE-acetaminophen (PERCOCET/ROXICET) 5-325 MG per tablet Take 1 tablet by mouth every 6 (six) hours as needed for moderate pain or severe pain. 11/17/14   Linwood Dibbles, MD  penicillin v potassium (VEETID) 500 MG tablet Take 1 tablet (500 mg total) by mouth 4 (four) times daily. 05/17/15 05/24/15  Celia Friedland Tripp Les [place], PA-C  rivaroxaban (XARELTO) 20 MG TABS tablet Take 1 tablet (20 mg total) by mouth daily with supper. 12/27/14   Doris Cheadle, MD  traMADol (ULTRAM) 50 MG tablet Take 1 tablet (50 mg total) by mouth every 6 (six) hours as needed. 12/17/14   Ozella Rocks, MD  Vitamin D, Ergocalciferol, (DRISDOL) 50000 UNITS CAPS capsule Take 1 capsule (50,000 Units total) by mouth every 7 (seven) days. 12/13/14   Doris Cheadle, MD  XARELTO STARTER PACK 15 & 20 MG TBPK Take 15-20 mg by mouth as directed. Take as directed on package: Start with one 15mg  tablet by mouth twice a day with food. On Day 22, switch to one 20mg  tablet once a day with food. 11/17/14   Linwood Dibbles, MD   BP 116/79 mmHg  Pulse 99  Temp(Src) 98.7 F (37.1 C) (Oral)  Resp 16  SpO2 97% Physical Exam  Constitutional: He is oriented  to person, place, and time. He appears well-developed and well-nourished. No distress.  HENT:  Head: Normocephalic and atraumatic.  Nose: Nose normal.  Mouth/Throat: Uvula is midline and oropharynx is clear and moist. He does not have dentures. No oral lesions. No trismus in the jaw. Abnormal dentition. Dental caries present. No dental abscesses, uvula swelling or lacerations. No oropharyngeal exudate.    14th tooth broken off at gum line. No evidence of infection or fluctuant masses. Buccal mucosa non erythematous. No edema under tongue.   Eyes: Conjunctivae and EOM are normal. Pupils are equal, round, and reactive to light. Right eye exhibits no  discharge. Left eye exhibits no discharge. No scleral icterus.  Neck: Normal range of motion. Neck supple.  Cardiovascular: Normal rate.   Pulmonary/Chest: Effort normal.  Abdominal: Soft.  Musculoskeletal: He exhibits no edema or tenderness.  Lymphadenopathy:    He has no cervical adenopathy.  Neurological: He is alert and oriented to person, place, and time. Coordination normal.  Skin: Skin is warm and dry. No rash noted. He is not diaphoretic. No erythema. No pallor.  Psychiatric: He has a normal mood and affect. His behavior is normal.  Nursing note and vitals reviewed.   ED Course  Procedures (including critical care time) Labs Review Labs Reviewed - No data to display  Imaging Review No results found. I have personally reviewed and evaluated these images and lab results as part of my medical decision-making.   EKG Interpretation None      MDM   Final diagnoses:  Pain, dental    Pt seen for dental pain. No evidence of dental abscess, no sign of infection. No evidence of Ludwig's angina. Recommend follow up with dentist for tooth extraction. Will give PCN and pain medication. Return precautions outlined in discharge instructions.     Lester Kinsman [place], PA-C 05/17/15 2315  Derwood Kaplan, MD 05/20/15 1756

## 2015-05-17 NOTE — ED Notes (Signed)
Pt. reports left upper molar pain onset last week .  

## 2015-06-17 ENCOUNTER — Telehealth: Payer: Self-pay | Admitting: Internal Medicine

## 2015-06-17 ENCOUNTER — Encounter (HOSPITAL_COMMUNITY): Payer: Self-pay | Admitting: *Deleted

## 2015-06-17 ENCOUNTER — Emergency Department (HOSPITAL_COMMUNITY)
Admission: EM | Admit: 2015-06-17 | Discharge: 2015-06-17 | Disposition: A | Discharge status: Home / Self Care | Payer: Self-pay | Attending: Emergency Medicine | Admitting: Emergency Medicine

## 2015-06-17 DIAGNOSIS — Z79899 Other long term (current) drug therapy: Secondary | ICD-10-CM | POA: Insufficient documentation

## 2015-06-17 DIAGNOSIS — K029 Dental caries, unspecified: Secondary | ICD-10-CM | POA: Insufficient documentation

## 2015-06-17 DIAGNOSIS — Z86718 Personal history of other venous thrombosis and embolism: Secondary | ICD-10-CM | POA: Insufficient documentation

## 2015-06-17 DIAGNOSIS — Z7901 Long term (current) use of anticoagulants: Secondary | ICD-10-CM | POA: Insufficient documentation

## 2015-06-17 DIAGNOSIS — K002 Abnormalities of size and form of teeth: Secondary | ICD-10-CM | POA: Insufficient documentation

## 2015-06-17 MED ORDER — AMOXICILLIN 500 MG PO CAPS: 500.0000 mg | ORAL_CAPSULE | Freq: Three times a day (TID) | ORAL | Status: DC

## 2015-06-17 MED ORDER — AMOXICILLIN 500 MG PO CAPS
500.0000 mg | ORAL_CAPSULE | Freq: Once | ORAL | Status: AC
  Administered 2015-06-17: 500 mg via ORAL
  Filled 2015-06-17: qty 1

## 2015-06-17 MED ORDER — BUPIVACAINE-EPINEPHRINE (PF) 0.5% -1:200000 IJ SOLN
1.8000 mL | Freq: Once | INTRAMUSCULAR | Status: DC
  Filled 2015-06-17: qty 1.8

## 2015-06-17 MED ORDER — HYDROCODONE-ACETAMINOPHEN 5-325 MG PO TABS: ORAL_TABLET | ORAL | Status: DC

## 2015-06-17 MED ORDER — BUPIVACAINE-EPINEPHRINE (PF) 0.5% -1:200000 IJ SOLN
1.8000 mL | Freq: Once | INTRAMUSCULAR | Status: AC
  Administered 2015-06-17: 1.8 mL
  Filled 2015-06-17: qty 1.8

## 2015-06-17 NOTE — ED Notes (Signed)
Pt c/o L upper & L lower dental pain onset x 2 wks, pt c/o broken teeth, 8/10 pain, A&O x4, hx of the same with abx tx

## 2015-06-17 NOTE — Telephone Encounter (Signed)
error 

## 2015-06-17 NOTE — Discharge Planning (Signed)
Buddy [facility] for [facility] [phone]  Patient is a current orange card holder and established with care at the [facility] center. Patient is in need of a dental referral from his pcp which was explained for the orange card dental clinic. Orange card application provided for patient to renew his card, patient instructed to contact me once the application is complete. Follow up appointment made with his pcp for October 12,2016 @ 10:00am, pt verbalized understanding of this appointment. My contact information provided for any future questions or concerns. No other [facility]ist needs identified at this time.

## 2015-06-17 NOTE — ED Provider Notes (Signed)
CSN: [id number]     Arrival date & time 06/17/15  1516 History  By signing my name below, I, Placido Sou, attest that this documentation has been prepared under the direction and in the presence of [employer], PA-C. Electronically Signed: Placido Sou, ED Scribe. 06/17/2015. 3:41 PM.     Chief Complaint  Patient presents with  . Dental Pain   The history is provided by the patient. No language interpreter was used.    HPI Comments: David Dunlap is a 44 y.o. male who presents to the Emergency Department complaining of worsening, moderate, left lower and left upper dental pain with onset 3 weeks ago. Pt rates his pain as 8/10 and notes taking OTC Advil which has provided little to no relief. Pt denies any known drug allergies. He notes having a dental provider but states that he cannot afford the co-pay.Denies fever/chills, difficulty opening jaw, difficulty swallowing, SOB, gum swelling, facial swelling, neck swelling.   Past Medical History  Diagnosis Date  . DVT (deep venous thrombosis)    Past Surgical History  Procedure Laterality Date  . Ligament repair      R forearm   Family History  Problem Relation Age of Onset  . Diabetes Mother   . Diabetes Father   . Hypertension Sister    Social History  Substance Use Topics  . Smoking status: Never Smoker   . Smokeless tobacco: None  . Alcohol Use: 0.0 oz/week    0 Standard drinks or equivalent per week    Review of Systems A complete 10 system review of systems was obtained and all systems are negative except as noted in the HPI and PMH.   Allergies  Review of patient's allergies indicates no known allergies.  Home Medications   Prior to Admission medications   Medication Sig Start Date End Date Taking? Authorizing Provider  acetaminophen (TYLENOL) 325 MG tablet Take 650 mg by mouth every 6 (six) hours as needed for mild pain.    Historical Provider, MD  amoxicillin (AMOXIL) 500 MG capsule Take 1 capsule  (500 mg total) by mouth 3 (three) times daily. 06/17/15   Nicole Pisciotta, PA-C  gabapentin (NEURONTIN) 100 MG capsule Take 1 capsule (100 mg total) by mouth 3 (three) times daily. 12/08/14   Doris Cheadle, MD  HYDROcodone-acetaminophen (NORCO/VICODIN) 5-325 MG tablet Take 1-2 tablets by mouth every 6 hours as needed for pain and/or cough. 06/17/15   Nicole Pisciotta, PA-C  oxyCODONE-acetaminophen (PERCOCET/ROXICET) 5-325 MG per tablet Take 1 tablet by mouth every 6 (six) hours as needed for moderate pain or severe pain. 11/17/14   Linwood Dibbles, MD  rivaroxaban (XARELTO) 20 MG TABS tablet Take 1 tablet (20 mg total) by mouth daily with supper. 12/27/14   Doris Cheadle, MD  traMADol (ULTRAM) 50 MG tablet Take 1 tablet (50 mg total) by mouth every 6 (six) hours as needed. 12/17/14   Ozella Rocks, MD  Vitamin D, Ergocalciferol, (DRISDOL) 50000 UNITS CAPS capsule Take 1 capsule (50,000 Units total) by mouth every 7 (seven) days. 12/13/14   Doris Cheadle, MD  XARELTO STARTER PACK 15 & 20 MG TBPK Take 15-20 mg by mouth as directed. Take as directed on package: Start with one  tablet by mouth twice a day with food. On Day 22, switch to one  tablet once a day with food. 11/17/14   Linwood Dibbles, MD   BP 133/88 mmHg  Pulse 105  Temp(Src) 98.4 F (36.9 C) (Oral)  Resp 18  SpO2 97% Physical Exam  Constitutional: He is oriented to person, place, and time. He appears well-developed and well-nourished. No distress.  HENT:  Head: Normocephalic and atraumatic.  Mouth/Throat: Oropharynx is clear and moist. No oropharyngeal exudate.  Generally poor dentition, no gingival swelling, erythema or tenderness to palpation. Patient is handling their secretions. There is no tenderness to palpation or firmness underneath tongue bilaterally. No trismus.    Eyes: Conjunctivae and EOM are normal. Pupils are equal, round, and reactive to light.  Neck: Normal range of motion. No tracheal deviation present.  Cardiovascular: Normal  rate.   Pulmonary/Chest: Effort normal. No stridor. No respiratory distress.  Abdominal: Soft. There is no tenderness.  Musculoskeletal: Normal range of motion.  Lymphadenopathy:    He has no cervical adenopathy.  Neurological: He is alert and oriented to person, place, and time.  Skin: Skin is warm and dry. He is not diaphoretic.  Psychiatric: He has a normal mood and affect. His behavior is normal.  Nursing note and vitals reviewed.   ED Course  NERVE BLOCK Date/Time: 06/17/2015 4:30 PM Performed by: Wynetta Emery Authorized by: Wynetta Emery Consent: Verbal consent obtained. Consent given by: patient Patient identity confirmed: verbally with patient Body area: face/mouth Nerve: inferior alveolar Laterality: left Patient sedated: no Patient position: sitting Needle gauge: 27 G Location technique: anatomical landmarks Local anesthetic: bupivacaine 0.5% with epinephrine Anesthetic total: 1.8 ml  NERVE BLOCK Date/Time: 06/17/2015 4:30 PM Performed by: Wynetta Emery Authorized by: Wynetta Emery Consent: Verbal consent obtained. Consent given by: patient Patient identity confirmed: verbally with patient Indications: pain relief Body area: face/mouth Nerve: posterior superior alveolar Laterality: left Patient sedated: no Patient position: sitting Needle gauge: 27 G Location technique: anatomical landmarks Anesthetic total: 1.8 ml Outcome: pain improved Patient tolerance: Patient tolerated the procedure well with no immediate complications    DIAGNOSTIC STUDIES: Oxygen Saturation is 97% on RA, normal by my interpretation.    COORDINATION OF CARE: 3:39 PM Discussed treatment plan with pt at bedside including a dental block and a referral for dental providers in the area. Pt agreed to plan.  Labs Review Labs Reviewed - No data to display  Imaging Review No results found.   EKG Interpretation None      MDM   Final diagnoses:  Pain due to  dental caries    Filed Vitals:   06/17/15 1528  BP: 133/88  Pulse: 105  Temp: 98.4 F (36.9 C)  TempSrc: Oral  Resp: 18  SpO2: 97%    Medications  bupivacaine-epinephrine (MARCAINE W/ EPI) 0.5% -1:200000 injection 1.8 mL (0 mLs Infiltration Hold 06/17/15 1551)  bupivacaine-epinephrine (MARCAINE W/ EPI) 0.5% -1:200000 injection 1.8 mL (1.8 mLs Infiltration Given by Other 06/17/15 1551)  amoxicillin (AMOXIL) capsule 500 mg (500 mg Oral Given 06/17/15 1550)    David Dunlap is a pleasant 44 y.o. male presenting with dental pain and multiple dental caries. No overt signs of infection. No signs of deep tissue infection. Patient cannot afford to see his dentist. Case discussed with case management who reports the patient will have to obtain a referral from primary care, an appointment has been set up for him at the wellness clinic.  Evaluation does not show pathology that would require ongoing emergent intervention or inpatient treatment. Pt is hemodynamically stable and mentating appropriately. Discussed findings and plan with patient/guardian, who agrees with care plan. All questions answered. Return precautions discussed and outpatient follow up given.   New Prescriptions   AMOXICILLIN (AMOXIL) 500 MG  CAPSULE    Take 1 capsule (500 mg total) by mouth 3 (three) times daily.   HYDROCODONE-ACETAMINOPHEN (NORCO/VICODIN) 5-325 MG TABLET    Take 1-2 tablets by mouth every 6 hours as needed for pain and/or cough.     I personally performed the services described in this documentation, which was scribed in my presence. The recorded information has been reviewed and is accurate.    NicolWynetta EmeryPA-C 06/17/15 1649  Mancel Bale, MD 06/20/15 [phone]

## 2015-06-17 NOTE — Discharge Instructions (Signed)
Take vicodin for breakthrough pain, do not drink alcohol, drive, care for children or do other critical tasks while taking vicodin. ° °Return to the emergency room for fever, change in vision, redness to the face that rapidly spreads towards the eye, nausea or vomiting, difficulty swallowing or shortness of breath. °  °Apply warm compresses to jaw throughout the day.  ° °Take your antibiotics as directed and to the end of the course.  ° °Followup with a dentist is very important for ongoing evaluation and management of recurrent dental pain. Return to emergency department for emergent changing or worsening symptoms." ° °Low-cost dental clinic: °**David  Civils  at [phone]**  °**Janna Civils at [phone] [address]**   ° °You may also call [phone] ° °Dental Assistance °If the dentist on-call cannot see you, please use the resources below: ° ° °Patients with Medicaid: [facility] °[address], [phone] °[address], [phone] ° °If unable to pay, or uninsured, contact [facility] ([phone]) or [facility] ([phone] in [place], [phone] in [place]) to become qualified for the adult dental clinic ° °Other Low-Cost Community Dental Services: °[facility]- [address], [place], [place], [address] °   [phone], Ext. 123 °   2nd and 4th Thursday of the month at 6:30am °   10 clients each day by appointment, can sometimes see walk-in     patients if someone does not show for an appointment °[facility]- [address], [place], [place], [address] °   [phone] °[facility]- [address], [place], [place], [address] °   [phone] ° °[facility]- [phone] °[facility]- [phone] °[facility]- [phone] ° °

## 2015-06-29 ENCOUNTER — Ambulatory Visit: Payer: Self-pay | Attending: Family Medicine | Admitting: Family Medicine

## 2015-06-29 ENCOUNTER — Encounter: Payer: Self-pay | Admitting: Family Medicine

## 2015-06-29 VITALS — BP 129/89 | HR 79 | Temp 97.4°F | Resp 16 | Ht 69.0 in | Wt 275.0 lb

## 2015-06-29 DIAGNOSIS — K029 Dental caries, unspecified: Secondary | ICD-10-CM | POA: Insufficient documentation

## 2015-06-29 DIAGNOSIS — I82503 Chronic embolism and thrombosis of unspecified deep veins of lower extremity, bilateral: Secondary | ICD-10-CM | POA: Insufficient documentation

## 2015-06-29 MED ORDER — TRAMADOL HCL 50 MG PO TABS: 50.0000 mg | ORAL_TABLET | Freq: Two times a day (BID) | ORAL | Status: DC | PRN

## 2015-06-29 NOTE — Progress Notes (Signed)
HFU: Dental Abscess,needs dental referral, he is an [employer]. Pt' needs a rx refill. Pt states he's in no pain today.

## 2015-06-29 NOTE — Progress Notes (Signed)
Subjective:    Patient ID: David Dunlap, male    DOB: April 06, 1971, 44 y.o.   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  He is in the clinic today requesting a referral to the dentist and also a refill of hydrocodone. He has no other complaints at this time.  Past Medical History  Diagnosis Date  . DVT (deep venous thrombosis) [facility](H[facility])     Past Surgical History  Procedure Laterality Date  . Ligament repair      R forearm    Social History   Social History  . Marital Status: Single    Spouse Name: N/A  . Number of Children: N/A  . Years of Education: N/A   Occupational History  . Not on file.   Social History Main Topics  . Smoking status: Never Smoker   . Smokeless tobacco: Not on file  . Alcohol Use: No  . Drug Use: Yes    Special: Marijuana  . Sexual Activity: Not on file   Other Topics Concern  . Not on file   Social History Narrative    No Known Allergies  Current Outpatient Prescriptions on File Prior to Visit  Medication Sig Dispense Refill  . acetaminophen (TYLENOL) 325 MG tablet Take 650 mg by mouth every 6 (six) hours as needed for mild pain.    Marland Kitchen. amoxicillin (AMOXIL) 500 MG capsule Take 1 capsule (500 mg total) by mouth 3 (three) times daily. 30 capsule 0  . gabapentin (NEURONTIN) 100 MG capsule Take 1 capsule (100 mg total) by mouth 3 (three) times daily. 90 capsule 3  . HYDROcodone-acetaminophen (NORCO/VICODIN) 5-325 MG tablet Take 1-2 tablets by mouth every 6 hours as needed for pain and/or cough. 7 tablet 0  . rivaroxaban (XARELTO) 20 MG TABS tablet Take 1 tablet (20 mg total) by mouth daily with supper. 90 tablet 3  . Vitamin D, Ergocalciferol, (DRISDOL) 50000 UNITS CAPS capsule Take 1 capsule (50,000 Units total) by mouth every 7  (seven) days. 12 capsule 0  . XARELTO STARTER PACK 15 & 20 MG TBPK Take 15-20 mg by mouth as directed. Take as directed on package: Start with one 15mg  tablet by mouth twice a day with food. On Day 22, switch to one 20mg  tablet once a day with food. 51 each 0   No current facility-administered medications on file prior to visit.      Review of Systems  Constitutional: Negative for activity change and appetite change.  HENT:       See history of present illness  Respiratory: Negative for chest tightness, shortness of breath and wheezing.   Gastrointestinal: Negative for abdominal pain, constipation and abdominal distention.  Genitourinary: Negative.   Musculoskeletal: Negative.   Skin: Negative.   Psychiatric/Behavioral: Negative for behavioral problems and dysphoric mood.       Objective: Filed Vitals:   06/29/15 0958  BP: 129/89  Pulse: 79  Temp: 97.4 F (36.3 C)  TempSrc: Oral  Resp: 16  Height: 5\' 9"  (1.753 m)  Weight: 275 lb (124.739 kg)  SpO2: 95%      Physical Exam  Constitutional: He is oriented to person, place, and time. He appears well-developed and well-nourished.  HENT:  Dental caries on left lower premolars and molars  Cardiovascular: Normal rate,  normal heart sounds and intact distal pulses.   No murmur heard. Pulmonary/Chest: Effort normal and breath sounds normal. He has no wheezes. He has no rales. He exhibits no tenderness.  Abdominal: Soft. Bowel sounds are normal. He exhibits no distension and no mass. There is no tenderness.  Musculoskeletal: Normal range of motion.  Neurological: He is alert and oriented to person, place, and time.          Assessment & Plan:  Dental caries: Advised to complete course of amoxicillin. I have placed him on tramadol for pain Referred to the dentist.  Chronic bilateral lower extremity DVT: Remains on Xarelto.  This note has been created with Education officer, environmental.  Any transcriptional errors are unintentional.

## 2015-06-29 NOTE — Patient Instructions (Signed)

## 2015-07-05 ENCOUNTER — Ambulatory Visit: Payer: Self-pay | Attending: Internal Medicine

## 2015-07-13 ENCOUNTER — Telehealth: Payer: Self-pay

## 2015-07-13 NOTE — Telephone Encounter (Signed)
Patient called [facility]. Patient Stated that [facility], did not have dental referral and is requesting that the referral be resent. Please follow up with patient.

## 2015-07-13 NOTE — Telephone Encounter (Signed)
It was fax on 10/19 . I will re faxed tomorrow  Thank you  Sent Urgent Referral to [facility] ph. # [phone] [address] [place], [place] [id number] They will contact the patient to schedule an appointment I don't know how long it will take.

## 2015-10-11 ENCOUNTER — Encounter (HOSPITAL_COMMUNITY): Payer: Self-pay | Admitting: Emergency Medicine

## 2015-10-11 ENCOUNTER — Emergency Department (HOSPITAL_COMMUNITY)
Admission: EM | Admit: 2015-10-11 | Discharge: 2015-10-12 | Disposition: A | Discharge status: Home / Self Care | Payer: Self-pay | Attending: Emergency Medicine | Admitting: Emergency Medicine

## 2015-10-11 ENCOUNTER — Emergency Department (HOSPITAL_COMMUNITY): Payer: Self-pay

## 2015-10-11 DIAGNOSIS — Z7982 Long term (current) use of aspirin: Secondary | ICD-10-CM | POA: Insufficient documentation

## 2015-10-11 DIAGNOSIS — Z7901 Long term (current) use of anticoagulants: Secondary | ICD-10-CM | POA: Insufficient documentation

## 2015-10-11 DIAGNOSIS — R600 Localized edema: Secondary | ICD-10-CM | POA: Insufficient documentation

## 2015-10-11 DIAGNOSIS — J209 Acute bronchitis, unspecified: Secondary | ICD-10-CM | POA: Insufficient documentation

## 2015-10-11 DIAGNOSIS — Z86718 Personal history of other venous thrombosis and embolism: Secondary | ICD-10-CM | POA: Insufficient documentation

## 2015-10-11 DIAGNOSIS — J40 Bronchitis, not specified as acute or chronic: Secondary | ICD-10-CM

## 2015-10-11 LAB — I-STAT TROPONIN, ED: Troponin i, poc: 0 ng/mL (ref 0.00–0.08)

## 2015-10-11 LAB — BASIC METABOLIC PANEL
ANION GAP: 10 (ref 5–15)
BUN: 10 mg/dL (ref 6–20)
CHLORIDE: 103 mmol/L (ref 101–111)
CO2: 24 mmol/L (ref 22–32)
CREATININE: 1.2 mg/dL (ref 0.61–1.24)
Calcium: 8.7 mg/dL — ABNORMAL LOW (ref 8.9–10.3)
GFR calc non Af Amer: >60 mL/min (ref >60)
Glucose, Bld: 145 mg/dL — ABNORMAL HIGH (ref 65–99)
POTASSIUM: 4.5 mmol/L (ref 3.5–5.1)
SODIUM: 137 mmol/L (ref 135–145)

## 2015-10-11 LAB — CBC
HCT: 47 % (ref 39.0–52.0)
HEMOGLOBIN: 16.8 g/dL (ref 13.0–17.0)
MCH: 29.1 pg (ref 26.0–34.0)
MCHC: 35.7 g/dL (ref 30.0–36.0)
MCV: 81.5 fL (ref 78.0–100.0)
PLATELETS: 189 10*3/uL (ref 150–400)
RBC: 5.77 MIL/uL (ref 4.22–5.81)
RDW: 13.1 % (ref 11.5–15.5)
WBC: 15.9 10*3/uL — AB (ref 4.0–10.5)

## 2015-10-11 MED ORDER — HYDROCODONE-HOMATROPINE 5-1.5 MG/5ML PO SYRP
10.0000 mL | ORAL_SOLUTION | Freq: Once | ORAL | Status: AC
  Administered 2015-10-11: 10 mL via ORAL
  Filled 2015-10-11: qty 10

## 2015-10-11 MED ORDER — AZITHROMYCIN 250 MG PO TABS
500.0000 mg | ORAL_TABLET | Freq: Once | ORAL | Status: AC
  Administered 2015-10-11: 500 mg via ORAL
  Filled 2015-10-11: qty 2

## 2015-10-11 MED ORDER — ALBUTEROL SULFATE HFA 108 (90 BASE) MCG/ACT IN AERS: 2.0000 | INHALATION_SPRAY | RESPIRATORY_TRACT | Status: DC | PRN

## 2015-10-11 MED ORDER — AEROCHAMBER PLUS W/MASK MISC: Status: DC

## 2015-10-11 MED ORDER — AZITHROMYCIN 250 MG PO TABS: 250.0000 mg | ORAL_TABLET | Freq: Every day | ORAL | Status: DC

## 2015-10-11 MED ORDER — HYDROCODONE-HOMATROPINE 5-1.5 MG/5ML PO SYRP: 5.0000 mL | ORAL_SOLUTION | Freq: Four times a day (QID) | ORAL | Status: DC | PRN

## 2015-10-11 MED ORDER — ALBUTEROL SULFATE HFA 108 (90 BASE) MCG/ACT IN AERS
2.0000 | INHALATION_SPRAY | Freq: Once | RESPIRATORY_TRACT | Status: AC
  Administered 2015-10-11: 2 via RESPIRATORY_TRACT
  Filled 2015-10-11: qty 6.7

## 2015-10-11 NOTE — ED Notes (Signed)
Pt reports intermittent L sided cp since 11am today lasting 2-3 mins each. Pt also reports dry cough.

## 2015-10-11 NOTE — ED Provider Notes (Signed)
CSN: [id number]     Arrival date & time 10/11/15  1552 History   First MD Initiated Contact with Patient 10/11/15 2127     Chief Complaint  Patient presents with  . Chest Pain     (Consider location/radiation/quality/duration/timing/severity/associated sxs/prior Treatment) HPI He has had a cough for approximately a week. He reports that he has harsh coughing episodes that sometimes make him feel short of breath. He denies however that he is not having shortness of breath except for with prolonged cough paroxysmal. He states that the cough has become productive with thick green-yellow sputum. Today he's noticed he is getting sharp quick pains on the left side of his chest. Patient reports that he has a history of DVT and is compliant with Xarelto. He denies he's ever had pulmonary embolus in association with this. He denies he's had increased swelling or pain in his legs. Past Medical History  Diagnosis Date  . DVT (deep venous thrombosis) [facility])    Past Surgical History  Procedure Laterality Date  . Ligament repair      R forearm   Family History  Problem Relation Age of Onset  . Diabetes Mother   . Diabetes Father   . Hypertension Sister    Social History  Substance Use Topics  . Smoking status: Never Smoker   . Smokeless tobacco: None  . Alcohol Use: No    Review of Systems  10 Systems reviewed and are negative for acute change except as noted in the HPI.   Allergies  Review of patient's allergies indicates no known allergies.  Home Medications   Prior to Admission medications   Medication Sig Start Date End Date Taking? Authorizing Provider  acetaminophen (TYLENOL) 325 MG tablet Take 650 mg by mouth every 6 (six) hours as needed for mild pain.   Yes Historical Provider, MD  aspirin 325 MG tablet Take 650 mg by mouth every 6 (six) hours as needed for mild pain.   Yes Historical Provider, MD  rivaroxaban (XARELTO) 20 MG TABS tablet Take 1 tablet (20 mg total) by mouth  daily with supper. 12/27/14  Yes Doris Cheadle, MD  albuterol (PROVENTIL HFA;VENTOLIN HFA) 108 (90 Base) MCG/ACT inhaler Inhale 2 puffs into the lungs every 4 (four) hours as needed for wheezing or shortness of breath. 10/11/15   Arby Barrette, MD  azithromycin (ZITHROMAX Z-PAK) 250 MG tablet Take 1 tablet (250 mg total) by mouth daily. 10/11/15   Arby Barrette, MD  gabapentin (NEURONTIN) 100 MG capsule Take 1 capsule (100 mg total) by mouth 3 (three) times daily. Patient not taking: Reported on 10/11/2015 12/08/14   Doris Cheadle, MD  HYDROcodone-homatropine (HYCODAN) 5-1.5 MG/5ML syrup Take 5 mLs by mouth every 6 (six) hours as needed for cough. You may take 5-10 mL every 6 hours as needed for cough. 10/11/15   Arby Barrette, MD  Spacer/Aero-Holding Chambers (AEROCHAMBER PLUS WITH MASK) inhaler Use as instructed 10/11/15   Arby Barrette, MD   BP 124/77 mmHg  Pulse 69  Temp(Src) 98.6 F (37 C) (Oral)  Resp 16  Ht  (1.753 m)  Wt 284 lb (128.822 kg)  BMI 41.92 kg/m2  SpO2 97% Physical Exam  Constitutional: He is oriented to person, place, and time. He appears well-developed and well-nourished.  HENT:  Head: Normocephalic and atraumatic.  Eyes: EOM are normal. Pupils are equal, round, and reactive to light.  Neck: Neck supple.  Cardiovascular: Normal rate, regular rhythm, normal heart sounds and intact distal pulses.  Pulmonary/Chest: Effort normal and breath sounds normal.  Patient has productive sounding cough. Was is exacerbated by deep inspiration. Airflow is good throughout the lung fields. No gross wheeze rhonchi or rail.  Abdominal: Soft. Bowel sounds are normal. He exhibits no distension. There is no tenderness.  Musculoskeletal: Normal range of motion. He exhibits edema. He exhibits no tenderness.  Patient has trace edema bilateral lower extremity is. His calves are soft and nontender.  Neurological: He is alert and oriented to person, place, and time. He has normal strength.  Coordination normal. GCS eye subscore is 4. GCS verbal subscore is 5. GCS motor subscore is 6.  Skin: Skin is warm, dry and intact.  Psychiatric: He has a normal mood and affect.    ED Course  Procedures (including critical care time) Labs Review Labs Reviewed  BASIC METABOLIC PANEL - Abnormal; Notable for the following:    Glucose, Bld 145 (*)    Calcium 8.7 (*)    All other components within normal limits  CBC - Abnormal; Notable for the following:    WBC 15.9 (*)    All other components within normal limits  Rosezena Sensor, ED    Imaging Review Dg Chest 2 View  10/11/2015  CLINICAL DATA:  45 year old with acute chest pain and shortness of breath. EXAM: CHEST  2 VIEW COMPARISON:  None. FINDINGS: The cardiomediastinal silhouette is unremarkable. There is no evidence of focal airspace disease, pulmonary edema, suspicious pulmonary nodule/mass, pleural effusion, or pneumothorax. No acute bony abnormalities are identified. IMPRESSION: No active cardiopulmonary disease. Electronically Signed   By: Harmon Pier M.D.   On: 10/11/2015 16:37   I have personally reviewed and evaluated these images and lab results as part of my medical decision-making.   EKG Interpretation   Date/Time:  Tuesday October 11 2015 16:02:26 EST Ventricular Rate:  86 PR Interval:  90 QRS Duration: 152 QT Interval:  390 QTC Calculation: 466 R Axis:   26 Text Interpretation:  Sinus rhythm with short PR with occasional Premature  ventricular complexes Non-specific intra-ventricular conduction block  Inferior infarct , age undetermined Abnormal ECG agree. no sig change from  old. Confirmed by Donnald Garre, MD, Lebron Conners [phone]) on 10/11/2015 4:03:39 PM      MDM   Final diagnoses:  Bronchitis   Patient presents with a weeks duration of cough that is been productive of green yellow sputum. Today he has developed refill, sharp chest pains. I feel this is most consistent with pleurisy associated with bronchitis. The  patient does have a leukocytosis. At this time I will opt to treat with Zithromax. Patient has history of DVT on Xarelto. I doubt pulmonary embolus. Patient does not endorse dyspnea, he does not have tachycardia and reports compliance with Xarelto. Patient is counseled on signs and symptoms which return. Patient will be treated with azithromycin, Hycodan and albuterol.    Arby Barrette, MD 10/11/15 2350

## 2015-10-12 MED FILL — VENTOLIN HFA 90 MCG INHALER: 108 (90 BAS | 25 days supply | Qty: 18 | Fill #0

## 2015-10-12 MED FILL — ?AZITHROMYCIN 250 MG TABLET: 250 MG | 4 days supply | Qty: 4 | Fill #0

## 2015-10-12 MED FILL — HYDROCODONE-HOMATROPINE SYR: 5-1.5 | 1 days supply | Qty: 60 | Fill #0

## 2015-10-12 NOTE — Discharge Instructions (Signed)
Acute Bronchitis °Bronchitis is inflammation of the airways that extend from the windpipe into the lungs (bronchi). The inflammation often causes mucus to develop. This leads to a cough, which is the most common symptom of bronchitis.  °In acute bronchitis, the condition usually develops suddenly and goes away over time, usually in a couple weeks. Smoking, allergies, and asthma can make bronchitis worse. Repeated episodes of bronchitis may cause further lung problems.  °CAUSES °Acute bronchitis is most often caused by the same virus that causes a cold. The virus can spread from person to person (contagious) through coughing, sneezing, and touching contaminated objects. °SIGNS AND SYMPTOMS  °· Cough.   °· Fever.   °· Coughing up mucus.   °· Body aches.   °· Chest congestion.   °· Chills.   °· Shortness of breath.   °· Sore throat.   °DIAGNOSIS  °Acute bronchitis is usually diagnosed through a physical exam. Your health care provider will also ask you questions about your medical history. Tests, such as chest X-rays, are sometimes done to rule out other conditions.  °TREATMENT  °Acute bronchitis usually goes away in a couple weeks. Oftentimes, no medical treatment is necessary. Medicines are sometimes given for relief of fever or cough. Antibiotic medicines are usually not needed but may be prescribed in certain situations. In some cases, an inhaler may be recommended to help reduce shortness of breath and control the cough. A cool mist vaporizer may also be used to help thin bronchial secretions and make it easier to clear the chest.  °HOME CARE INSTRUCTIONS °· Get plenty of rest.   °· Drink enough fluids to keep your urine clear or pale yellow (unless you have a medical condition that requires fluid restriction). Increasing fluids may help thin your respiratory secretions (sputum) and reduce chest congestion, and it will prevent dehydration.   °· Take medicines only as directed by your health care provider. °· If  you were prescribed an antibiotic medicine, finish it all even if you start to feel better. °· Avoid smoking and secondhand smoke. Exposure to cigarette smoke or irritating chemicals will make bronchitis worse. If you are a smoker, consider using nicotine gum or skin patches to help control withdrawal symptoms. Quitting smoking will help your lungs heal faster.   °· Reduce the chances of another bout of acute bronchitis by washing your hands frequently, avoiding people with cold symptoms, and trying not to touch your hands to your mouth, nose, or eyes.   °· Keep all follow-up visits as directed by your health care provider.   °SEEK MEDICAL CARE IF: °Your symptoms do not improve after 1 week of treatment.  °SEEK IMMEDIATE MEDICAL CARE IF: °· You develop an increased fever or chills.   °· You have chest pain.   °· You have severe shortness of breath. °· You have bloody sputum.   °· You develop dehydration. °· You faint or repeatedly feel like you are going to pass out. °· You develop repeated vomiting. °· You develop a severe headache. °MAKE SURE YOU:  °· Understand these instructions. °· Will watch your condition. °· Will get help right away if you are not doing well or get worse. °  °This information is not intended to replace advice given to you by your health care provider. Make sure you discuss any questions you have with your health care provider. °  °Document Released: 10/11/2004 Document Revised: 09/24/2014 Document Reviewed: 02/24/2013 °Elsevier Interactive Patient Education ©2016 [employer] ° °Nonspecific Chest Pain  °Chest pain can be caused by many different conditions. There is always a chance   that your pain could be related to something serious, such as a heart attack or a blood clot in your lungs. Chest pain can also be caused by conditions that are not life-threatening. If you have chest pain, it is very important to follow up with your health care provider. °CAUSES  °Chest pain can be caused  by: °· Heartburn. °· Pneumonia or bronchitis. °· Anxiety or stress. °· Inflammation around your heart (pericarditis) or lung (pleuritis or pleurisy). °· A blood clot in your lung. °· A collapsed lung (pneumothorax). It can develop suddenly on its own (spontaneous pneumothorax) or from trauma to the chest. °· Shingles infection (varicella-zoster virus). °· Heart attack. °· Damage to the bones, muscles, and cartilage that make up your chest wall. This can include: °¨ Bruised bones due to injury. °¨ Strained muscles or cartilage due to frequent or repeated coughing or overwork. °¨ Fracture to one or more ribs. °¨ Sore cartilage due to inflammation (costochondritis). °RISK FACTORS  °Risk factors for chest pain may include: °· Activities that increase your risk for trauma or injury to your chest. °· Respiratory infections or conditions that cause frequent coughing. °· Medical conditions or overeating that can cause heartburn. °· Heart disease or family history of heart disease. °· Conditions or health behaviors that increase your risk of developing a blood clot. °· Having had chicken pox (varicella zoster). °SIGNS AND SYMPTOMS °Chest pain can feel like: °· Burning or tingling on the surface of your chest or deep in your chest. °· Crushing, pressure, aching, or squeezing pain. °· Dull or sharp pain that is worse when you move, cough, or take a deep breath. °· Pain that is also felt in your back, neck, shoulder, or arm, or pain that spreads to any of these areas. °Your chest pain may come and go, or it may stay constant. °DIAGNOSIS °Lab tests or other studies may be needed to find the cause of your pain. Your health care provider may have you take a test called an ambulatory ECG (electrocardiogram). An ECG records your heartbeat patterns at the time the test is performed. You may also have other tests, such as: °· Transthoracic echocardiogram (TTE). During echocardiography, sound waves are used to create a picture of all  of the heart structures and to look at how blood flows through your heart. °· Transesophageal echocardiogram (TEE). This is a more advanced imaging test that obtains images from inside your body. It allows your health care provider to see your heart in finer detail. °· Cardiac monitoring. This allows your health care provider to monitor your heart rate and rhythm in real time. °· Holter monitor. This is a portable device that records your heartbeat and can help to diagnose abnormal heartbeats. It allows your health care provider to track your heart activity for several days, if needed. °· Stress tests. These can be done through exercise or by taking medicine that makes your heart beat more quickly. °· Blood tests. °· Imaging tests. °TREATMENT  °Your treatment depends on what is causing your chest pain. Treatment may include: °· Medicines. These may include: °¨ Acid blockers for heartburn. °¨ Anti-inflammatory medicine. °¨ Pain medicine for inflammatory conditions. °¨ Antibiotic medicine, if an infection is present. °¨ Medicines to dissolve blood clots. °¨ Medicines to treat coronary artery disease. °· Supportive care for conditions that do not require medicines. This may include: °¨ Resting. °¨ Applying heat or cold packs to injured areas. °¨ Limiting activities until pain decreases. °HOME CARE INSTRUCTIONS °· If   you were prescribed an antibiotic medicine, finish it all even if you start to feel better. °· Avoid any activities that bring on chest pain. °· Do not use any tobacco products, including cigarettes, chewing tobacco, or electronic cigarettes. If you need help quitting, ask your health care provider. °· Do not drink alcohol. °· Take medicines only as directed by your health care provider. °· Keep all follow-up visits as directed by your health care provider. This is important. This includes any further testing if your chest pain does not go away. °· If heartburn is the cause for your chest pain, you may be  told to keep your head raised (elevated) while sleeping. This reduces the chance that acid will go from your stomach into your esophagus. °· Make lifestyle changes as directed by your health care provider. These may include: °¨ Getting regular exercise. Ask your health care provider to suggest some activities that are safe for you. °¨ Eating a heart-healthy diet. A registered dietitian can help you to learn healthy eating options. °¨ Maintaining a healthy weight. °¨ Managing diabetes, if necessary. °¨ Reducing stress. °SEEK MEDICAL CARE IF: °· Your chest pain does not go away after treatment. °· You have a rash with blisters on your chest. °· You have a fever. °SEEK IMMEDIATE MEDICAL CARE IF:  °· Your chest pain is worse. °· You have an increasing cough, or you cough up blood. °· You have severe abdominal pain. °· You have severe weakness. °· You faint. °· You have chills. °· You have sudden, unexplained chest discomfort. °· You have sudden, unexplained discomfort in your arms, back, neck, or jaw. °· You have shortness of breath at any time. °· You suddenly start to sweat, or your skin gets clammy. °· You feel nauseous or you vomit. °· You suddenly feel light-headed or dizzy. °· Your heart begins to beat quickly, or it feels like it is skipping beats. °These symptoms may represent a serious problem that is an emergency. Do not wait to see if the symptoms will go away. Get medical help right away. Call your local emergency services (911 in the [place] Do not drive yourself to the hospital. °  °This information is not intended to replace advice given to you by your health care provider. Make sure you discuss any questions you have with your health care provider. °  °Document Released: 06/13/2005 Document Revised: 09/24/2014 Document Reviewed: 04/09/2014 °Elsevier Interactive Patient Education ©2016 [employer] ° °

## 2015-10-28 ENCOUNTER — Emergency Department (HOSPITAL_COMMUNITY): Payer: No Typology Code available for payment source

## 2015-10-28 ENCOUNTER — Emergency Department (HOSPITAL_COMMUNITY)
Admission: EM | Admit: 2015-10-28 | Discharge: 2015-10-29 | Disposition: A | Discharge status: Home / Self Care | Payer: No Typology Code available for payment source | Attending: Emergency Medicine | Admitting: Emergency Medicine

## 2015-10-28 ENCOUNTER — Emergency Department (HOSPITAL_BASED_OUTPATIENT_CLINIC_OR_DEPARTMENT_OTHER)
Admit: 2015-10-28 | Discharge: 2015-10-28 | Disposition: A | Discharge status: Home / Self Care | Payer: No Typology Code available for payment source | Attending: Emergency Medicine | Admitting: Emergency Medicine

## 2015-10-28 ENCOUNTER — Encounter (HOSPITAL_COMMUNITY): Payer: Self-pay | Admitting: Emergency Medicine

## 2015-10-28 ENCOUNTER — Emergency Department (INDEPENDENT_AMBULATORY_CARE_PROVIDER_SITE_OTHER)
Admission: EM | Admit: 2015-10-28 | Discharge: 2015-10-28 | Disposition: A | Discharge status: Other Acute Inpatient Hospital | Payer: No Typology Code available for payment source | Source: Home / Self Care | Attending: Emergency Medicine | Admitting: Emergency Medicine

## 2015-10-28 ENCOUNTER — Encounter (HOSPITAL_COMMUNITY): Payer: Self-pay

## 2015-10-28 DIAGNOSIS — Y9389 Activity, other specified: Secondary | ICD-10-CM | POA: Insufficient documentation

## 2015-10-28 DIAGNOSIS — Z7901 Long term (current) use of anticoagulants: Secondary | ICD-10-CM | POA: Insufficient documentation

## 2015-10-28 DIAGNOSIS — M79605 Pain in left leg: Secondary | ICD-10-CM

## 2015-10-28 DIAGNOSIS — S8992XA Unspecified injury of left lower leg, initial encounter: Secondary | ICD-10-CM | POA: Insufficient documentation

## 2015-10-28 DIAGNOSIS — M722 Plantar fascial fibromatosis: Secondary | ICD-10-CM

## 2015-10-28 DIAGNOSIS — Y9289 Other specified places as the place of occurrence of the external cause: Secondary | ICD-10-CM | POA: Insufficient documentation

## 2015-10-28 DIAGNOSIS — M79609 Pain in unspecified limb: Secondary | ICD-10-CM

## 2015-10-28 DIAGNOSIS — Y999 Unspecified external cause status: Secondary | ICD-10-CM | POA: Insufficient documentation

## 2015-10-28 DIAGNOSIS — Z86718 Personal history of other venous thrombosis and embolism: Secondary | ICD-10-CM | POA: Insufficient documentation

## 2015-10-28 DIAGNOSIS — M79662 Pain in left lower leg: Secondary | ICD-10-CM

## 2015-10-28 DIAGNOSIS — M7989 Other specified soft tissue disorders: Secondary | ICD-10-CM

## 2015-10-28 DIAGNOSIS — W1840XA Slipping, tripping and stumbling without falling, unspecified, initial encounter: Secondary | ICD-10-CM | POA: Insufficient documentation

## 2015-10-28 LAB — PROTIME-INR
INR: 1.02 (ref 0.00–1.49)
PROTHROMBIN TIME: 13.6 s (ref 11.6–15.2)

## 2015-10-28 MED ORDER — OXYCODONE-ACETAMINOPHEN 5-325 MG PO TABS
1.0000 | ORAL_TABLET | Freq: Once | ORAL | Status: AC
  Administered 2015-10-28: 1 via ORAL

## 2015-10-28 MED ORDER — OXYCODONE-ACETAMINOPHEN 5-325 MG PO TABS
ORAL_TABLET | ORAL | Status: AC
  Filled 2015-10-28: qty 1

## 2015-10-28 MED ORDER — PREDNISONE 50 MG PO TABS: ORAL_TABLET | ORAL | Status: DC

## 2015-10-28 NOTE — Progress Notes (Signed)
Preliminary results by tech - Left Lower Ext. Venous duplex completed. No evidence of acute deep vein thrombosis noted in the left leg. 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  Marilynne Halsted, BS, RDMS, RVT

## 2015-10-28 NOTE — ED Notes (Signed)
Patient transported to X-ray 

## 2015-10-28 NOTE — ED Notes (Signed)
Pt reports that he was sent here from urgent care due to left lower leg swelling x 7 days. Pt was sent for a DVT study. Pt alert x4. NAD at this time.

## 2015-10-28 NOTE — ED Notes (Signed)
Pt back from X-Ray.  

## 2015-10-28 NOTE — ED Provider Notes (Signed)
CSN: [id number]     [id number] & time 10/28/15  1348 History   None    Chief Complaint  Patient presents with  . Leg Pain   (Consider location/radiation/quality/duration/timing/severity/associated sxs/prior Treatment) HPI  He is a 45 year old man here for evaluation of left leg pain. He states this started about a week ago. It has stayed the same since then. It is worse with walking and activity. It improved some at rest. He has noticed some increased swelling in the left leg. Pain is located in the posterior and medial calf. It does radiate down into his foot. He states the bottom of his foot is quite sore as well. No known injury or trauma. No falls. No recent changes in activity. He was diagnosed with a DVT in the right leg about a year ago. He is still taking Xarelto daily.  Past Medical History  Diagnosis Date  . DVT (deep venous thrombosis) [facility])    Past Surgical History  Procedure Laterality Date  . Ligament repair      R forearm   Family History  Problem Relation Age of Onset  . Diabetes Mother   . Diabetes Father   . Hypertension Sister    Social History  Substance Use Topics  . Smoking status: Never Smoker   . Smokeless tobacco: None  . Alcohol Use: No    Review of Systems As in history of present illness Allergies  Review of patient's allergies indicates no known allergies.  Home Medications   Prior to Admission medications   Medication Sig Start Date End Date Taking? Authorizing Provider  acetaminophen (TYLENOL) 325 MG tablet Take 650 mg by mouth every 6 (six) hours as needed for mild pain.    Historical Provider, MD  albuterol (PROVENTIL HFA;VENTOLIN HFA) 108 (90 Base) MCG/ACT inhaler Inhale 2 puffs into the lungs every 4 (four) hours as needed for wheezing or shortness of breath. 10/11/15   Arby Barrette, MD  aspirin 325 MG tablet Take 650 mg by mouth every 6 (six) hours as needed for mild pain.    Historical Provider, MD  azithromycin (ZITHROMAX Z-PAK)  250 MG tablet Take 1 tablet (250 mg total) by mouth daily. 10/11/15   Arby Barrette, MD  gabapentin (NEURONTIN) 100 MG capsule Take 1 capsule (100 mg total) by mouth 3 (three) times daily. Patient not taking: Reported on 10/11/2015 12/08/14   Doris Cheadle, MD  HYDROcodone-homatropine (HYCODAN) 5-1.5 MG/5ML syrup Take 5 mLs by mouth every 6 (six) hours as needed for cough. You may take 5-10 mL every 6 hours as needed for cough. 10/11/15   Arby Barrette, MD  predniSONE (DELTASONE) 50 MG tablet Take 1 pill daily for 5 days. 10/28/15   Charm Rings, MD  rivaroxaban (XARELTO) 20 MG TABS tablet Take 1 tablet (20 mg total) by mouth daily with supper. 12/27/14   Doris Cheadle, MD  Spacer/Aero-Holding Chambers (AEROCHAMBER PLUS WITH MASK) inhaler Use as instructed 10/11/15   Arby Barrette, MD   Meds Ordered and Administered this Visit  Medications - No data to display  BP 123/91 mmHg  Pulse 103  Temp(Src) 98.7 F (37.1 C) (Oral)  Resp 16  SpO2 98% No data found.   Physical Exam  Constitutional: He is oriented to person, place, and time. He appears well-developed and well-nourished. No distress.  Cardiovascular:  Mild tachycardia  Pulmonary/Chest: Effort normal.  Musculoskeletal:  He has bilateral lower extremity edema, slightly worse on the left. Left leg: No erythema or bruising seen. He  is tender to palpation particularly along the posterior medial aspect of the lower leg. No palpable cords. He has pain with both resisted plantar flexion and dorsiflexion. 2+ DP pulse. Left foot: He is tender to palpation along the plantar fascia  Neurological: He is alert and oriented to person, place, and time.    ED Course  Procedures (including critical care time)  Labs Review Labs Reviewed - No data to display  Imaging Review No results found.    MDM   1. Calf pain, left   2. 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  We'll transfer to the ER via shuttle  for DVT rule out. I suspect the foot pain is from plantar fasciitis. I sent a prescription for prednisone to his pharmacy to help with inflammation. We also discussed ice massage.    Charm Rings, MD 10/28/15 [phone]

## 2015-10-28 NOTE — ED Notes (Addendum)
Pt stated that he was told about a year ago that he had a DVT in his left leg. Pt stated that his leg has been in pain for a week from his knee down to the bottom of his foot Pt is alert and oriented.

## 2015-10-29 LAB — CBC WITH DIFFERENTIAL/PLATELET
BASOS PCT: 0 %
Basophils Absolute: 0 10*3/uL (ref 0.0–0.1)
Eosinophils Absolute: 0.3 10*3/uL (ref 0.0–0.7)
Eosinophils Relative: 2 %
HEMATOCRIT: 43.8 % (ref 39.0–52.0)
HEMOGLOBIN: 15.1 g/dL (ref 13.0–17.0)
LYMPHS ABS: 3.6 10*3/uL (ref 0.7–4.0)
LYMPHS PCT: 27 %
MCH: 28.2 pg (ref 26.0–34.0)
MCHC: 34.5 g/dL (ref 30.0–36.0)
MCV: 81.9 fL (ref 78.0–100.0)
MONO ABS: 1.4 10*3/uL — AB (ref 0.1–1.0)
MONOS PCT: 11 %
NEUTROS ABS: 8.1 10*3/uL — AB (ref 1.7–7.7)
NEUTROS PCT: 60 %
Platelets: 177 10*3/uL (ref 150–400)
RBC: 5.35 MIL/uL (ref 4.22–5.81)
RDW: 13.2 % (ref 11.5–15.5)
WBC: 13.4 10*3/uL — ABNORMAL HIGH (ref 4.0–10.5)

## 2015-10-29 LAB — COMPREHENSIVE METABOLIC PANEL
ALBUMIN: 2.9 g/dL — AB (ref 3.5–5.0)
ALK PHOS: 45 U/L (ref 38–126)
ALT: 30 U/L (ref 17–63)
ANION GAP: 9 (ref 5–15)
AST: 11 U/L — ABNORMAL LOW (ref 15–41)
BILIRUBIN TOTAL: 0.6 mg/dL (ref 0.3–1.2)
BUN: 14 mg/dL (ref 6–20)
CALCIUM: 8.8 mg/dL — AB (ref 8.9–10.3)
CO2: 27 mmol/L (ref 22–32)
Chloride: 102 mmol/L (ref 101–111)
Creatinine, Ser: 1.09 mg/dL (ref 0.61–1.24)
GLUCOSE: 110 mg/dL — AB (ref 65–99)
POTASSIUM: 4.4 mmol/L (ref 3.5–5.1)
Sodium: 138 mmol/L (ref 135–145)
TOTAL PROTEIN: 5.7 g/dL — AB (ref 6.5–8.1)

## 2015-10-29 NOTE — ED Provider Notes (Signed)
I saw and evaluated the patient, reviewed the resident's note and I agree with the findings and plan.   EKG Interpretation None      Results for orders placed or performed during the hospital encounter of 10/28/15  CBC with Differential  Result Value Ref Range   WBC 13.4 (H) 4.0 - 10.5 K/uL   RBC 5.35 4.22 - 5.81 MIL/uL   Hemoglobin 15.1 13.0 - 17.0 g/dL   HCT 16.1 09.6 - 04.5 %   MCV 81.9 78.0 - 100.0 fL   MCH 28.2 26.0 - 34.0 pg   MCHC 34.5 30.0 - 36.0 g/dL   RDW 40.9 81.1 - 91.4 %   Platelets 177 150 - 400 K/uL   Neutrophils Relative % 60 %   Neutro Abs 8.1 (H) 1.7 - 7.7 K/uL   Lymphocytes Relative 27 %   Lymphs Abs 3.6 0.7 - 4.0 K/uL   Monocytes Relative 11 %   Monocytes Absolute 1.4 (H) 0.1 - 1.0 K/uL   Eosinophils Relative 2 %   Eosinophils Absolute 0.3 0.0 - 0.7 K/uL   Basophils Relative 0 %   Basophils Absolute 0.0 0.0 - 0.1 K/uL  Comprehensive metabolic panel  Result Value Ref Range   Sodium 138 135 - 145 mmol/L   Potassium 4.4 3.5 - 5.1 mmol/L   Chloride 102 101 - 111 mmol/L   CO2 27 22 - 32 mmol/L   Glucose, Bld 110 (H) 65 - 99 mg/dL   BUN 14 6 - 20 mg/dL   Creatinine, Ser 7.82 0.61 - 1.24 mg/dL   Calcium 8.8 (L) 8.9 - 10.3 mg/dL   Total Protein 5.7 (L) 6.5 - 8.1 g/dL   Albumin 2.9 (L) 3.5 - 5.0 g/dL   AST 11 (L) 15 - 41 U/L   ALT 30 17 - 63 U/L   Alkaline Phosphatase 45 38 - 126 U/L   Total Bilirubin 0.6 0.3 - 1.2 mg/dL   GFR calc non Af Amer >60 >60 mL/min   GFR calc Af Amer >60 >60 mL/min   Anion gap 9 5 - 15  Protime-INR  Result Value Ref Range   Prothrombin Time 13.6 11.6 - 15.2 seconds   INR 1.02 0.00 - 1.49   Dg Chest 2 View  10/11/2015  CLINICAL DATA:  45 year old with acute chest pain and shortness of breath. EXAM: CHEST  2 VIEW COMPARISON:  None. FINDINGS: The cardiomediastinal silhouette is unremarkable. There is no evidence of focal airspace disease, pulmonary edema, suspicious pulmonary nodule/mass, pleural effusion, or pneumothorax. No  acute bony abnormalities are identified. IMPRESSION: No active cardiopulmonary disease. Electronically Signed   By: Harmon Pier M.D.   On: 10/11/2015 16:37   Dg Tibia/fibula Left  10/28/2015  CLINICAL DATA:  Left foot pain for 1 week. EXAM: LEFT TIBIA AND FIBULA - 2 VIEW COMPARISON:  None. FINDINGS: AP views of the left tibia and fibula are provided. Bone mineralization is normal. No acute-appearing cortical irregularity or osseous lesion. Calcifications underlying the medial malleolus are most suggestive of either chronic degenerative spurring or old avulsion fracture fragments. No evidence of acute fracture. Ankle mortise is symmetric. Soft tissues are unremarkable. IMPRESSION: 1. No acute findings. 2. 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 Electronically Signed   By: Bary Richard M.D.   On: 10/28/2015 23:02   Dg Foot Complete Left  10/28/2015  CLINICAL DATA:  Left leg pain for 1 week. EXAM: LEFT FOOT - COMPLETE 3+ VIEW COMPARISON:  None. FINDINGS: Mild degenerative spurring  is seen throughout the midfoot. No acute-appearing cortical irregularity or osseous lesion. No fracture line or displaced fracture fragment. Bone mineralization is normal. Adjacent soft tissues are unremarkable. IMPRESSION: 1. No acute findings. 2. Degenerative spurring within the midfoot. Electronically Signed   By: Bary Richard M.D.   On: 10/28/2015 23:00    Patient referred from urgent care this afternoon. Patient with complaint of left lower leg swelling and some discomfort to the medial aspect distal aspect of the left leg. Patient sent for DVT studies. Doppler study showed no acute clot. Patient is on blood thinners. Examination of the left leg shows some slight discoloration on the medial side could be representative of slight hemorrhage underneath the skin or some inflammatory changes in the superficial veins. Patient nontoxic no acute distress. Patient's dorsalis pedis pulse on the left leg is 2+ Refill is 1  second. X-rays which were done at the urgent care the tib-fib area and the foot have no acute findings. Is no warmth to the ankle area do not think that this is episode of gout or pseudogout. Outpatient follow-up is probably appropriate.  Vanetta Mulders, MD 10/29/15 [phone]

## 2015-10-29 NOTE — Discharge Instructions (Signed)

## 2015-10-29 NOTE — ED Provider Notes (Signed)
CSN: [id number]     Arrival date & time 10/28/15  1513 History   First MD Initiated Contact with Patient 10/28/15 2138     Chief Complaint  Patient presents with  . Leg Swelling     (Consider location/radiation/quality/duration/timing/severity/associated sxs/prior Treatment) Patient is a 45 y.o. male presenting with leg pain.  Leg Pain Location:  Leg Time since incident:  1 week Injury: no   Leg location:  L leg Pain details:    Quality:  Aching   Radiates to:  Does not radiate   Severity:  Moderate   Onset quality:  Gradual   Duration:  1 week   Timing:  Constant   Progression:  Unchanged Chronicity: similar to prior DVT. Dislocation: no   Relieved by:  Nothing Worsened by:  Bearing weight Ineffective treatments:  Acetaminophen Associated symptoms: swelling   Associated symptoms: no back pain, no decreased ROM, no fatigue and no fever    Patient reports that he slipped on ice about a week ago but did not fall. Since then he has noted the pain and swelling in his left leg. Past Medical History  Diagnosis Date  . DVT (deep venous thrombosis) [facility])    Past Surgical History  Procedure Laterality Date  . Ligament repair      R forearm   Family History  Problem Relation Age of Onset  . Diabetes Mother   . Diabetes Father   . Hypertension Sister    Social History  Substance Use Topics  . Smoking status: Never Smoker   . Smokeless tobacco: None  . Alcohol Use: No    Review of Systems  Constitutional: Negative for fever, chills, appetite change and fatigue.  HENT: Negative for congestion, ear pain, facial swelling, mouth sores and sore throat.   Eyes: Negative for visual disturbance.  Respiratory: Negative for cough, chest tightness and shortness of breath.   Cardiovascular: Negative for chest pain and palpitations.  Gastrointestinal: Negative for nausea, vomiting, abdominal pain, diarrhea and blood in stool.  Endocrine: Negative for cold intolerance and heat  intolerance.  Genitourinary: Negative for frequency, decreased urine volume and difficulty urinating.  Musculoskeletal: Negative for back pain and neck stiffness.  Skin: Negative for rash.  Neurological: Negative for dizziness, weakness, light-headedness and headaches.  All other systems reviewed and are negative.     Allergies  Review of patient's allergies indicates no known allergies.  Home Medications   Prior to Admission medications   Medication Sig Start Date End Date Taking? Authorizing Provider  albuterol (PROVENTIL HFA;VENTOLIN HFA) 108 (90 Base) MCG/ACT inhaler Inhale 2 puffs into the lungs every 4 (four) hours as needed for wheezing or shortness of breath. 10/11/15  Yes Arby Barrette, MD  aspirin 325 MG tablet Take 650 mg by mouth every 6 (six) hours as needed for mild pain.   Yes Historical Provider, MD  rivaroxaban (XARELTO) 20 MG TABS tablet Take 1 tablet (20 mg total) by mouth daily with supper. 12/27/14  Yes Doris Cheadle, MD  Spacer/Aero-Holding Chambers (AEROCHAMBER PLUS WITH MASK) inhaler Use as instructed 10/11/15  Yes Arby Barrette, MD  azithromycin (ZITHROMAX Z-PAK) 250 MG tablet Take 1 tablet (250 mg total) by mouth daily. Patient not taking: Reported on 10/28/2015 10/11/15   Arby Barrette, MD  gabapentin (NEURONTIN) 100 MG capsule Take 1 capsule (100 mg total) by mouth 3 (three) times daily. Patient not taking: Reported on 10/11/2015 12/08/14   Doris Cheadle, MD  HYDROcodone-homatropine (HYCODAN) 5-1.5 MG/5ML syrup Take 5 mLs by mouth  every 6 (six) hours as needed for cough. You may take 5-10 mL every 6 hours as needed for cough. Patient not taking: Reported on 10/28/2015 10/11/15   Arby Barrette, MD  predniSONE (DELTASONE) 50 MG tablet Take 1 pill daily for 5 days. Patient not taking: Reported on 10/28/2015 10/28/15   Charm Rings, MD   BP 128/104 mmHg  Pulse 71  Temp(Src) 98.1 F (36.7 C) (Oral)  Resp 18  SpO2 98% Physical Exam  Constitutional: He is oriented  to person, place, and time. He appears well-nourished. No distress.  HENT:  Head: Normocephalic and atraumatic.  Right Ear: External ear normal.  Left Ear: External ear normal.  Eyes: Pupils are equal, round, and reactive to light. Right eye exhibits no discharge. Left eye exhibits no discharge. No scleral icterus.  Neck: Normal range of motion. Neck supple.  Cardiovascular: Normal rate.  Exam reveals no gallop and no friction rub.   No murmur heard. Pulmonary/Chest: Effort normal and breath sounds normal. No stridor. No respiratory distress. He has no wheezes. He has no rales. He exhibits no tenderness.  Abdominal: Soft. He exhibits no distension and no mass. There is no tenderness. There is no rebound and no guarding.  Musculoskeletal: He exhibits no edema.       Left lower leg: He exhibits tenderness.       Legs: No appreciable swelling of the left leg when compared to the contralateral side. Pulses intact.  Neurological: He is alert and oriented to person, place, and time.  Skin: Skin is warm and dry. No rash noted. He is not diaphoretic. No erythema.    ED Course  Procedures (including critical care time) Labs Review Labs Reviewed  CBC WITH DIFFERENTIAL/PLATELET - Abnormal; Notable for the following:    WBC 13.4 (*)    Neutro Abs 8.1 (*)    Monocytes Absolute 1.4 (*)    All other components within normal limits  COMPREHENSIVE METABOLIC PANEL - Abnormal; Notable for the following:    Glucose, Bld 110 (*)    Calcium 8.8 (*)    Total Protein 5.7 (*)    Albumin 2.9 (*)    AST 11 (*)    All other components within normal limits  PROTIME-INR    Imaging Review Dg Tibia/fibula Left  10/28/2015  CLINICAL DATA:  Left foot pain for 1 week. EXAM: LEFT TIBIA AND FIBULA - 2 VIEW COMPARISON:  None. FINDINGS: AP views of the left tibia and fibula are provided. Bone mineralization is normal. No acute-appearing cortical irregularity or osseous lesion. Calcifications underlying the medial  malleolus are most suggestive of either chronic degenerative spurring or old avulsion fracture fragments. No evidence of acute fracture. Ankle mortise is symmetric. Soft tissues are unremarkable. IMPRESSION: 1. No acute findings. 2. 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 Electronically Signed   By: Bary Richard M.D.   On: 10/28/2015 23:02   Dg Foot Complete Left  10/28/2015  CLINICAL DATA:  Left leg pain for 1 week. EXAM: LEFT FOOT - COMPLETE 3+ VIEW COMPARISON:  None. FINDINGS: Mild degenerative spurring is seen throughout the midfoot. No acute-appearing cortical irregularity or osseous lesion. No fracture line or displaced fracture fragment. Bone mineralization is normal. Adjacent soft tissues are unremarkable. IMPRESSION: 1. No acute findings. 2. Degenerative spurring within the midfoot. Electronically Signed   By: Bary Richard M.D.   On: 10/28/2015 23:00   I have personally reviewed and evaluated these images and lab results as part of my medical decision-making.  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 Patient was seen in urgent care and referred to our ED for DVT study. Patient denies any other complaints.  Workup revealed no evidence of DVT. Plain films negative for acute injuries. Patient patient not concerning for arterial occlusion. Likely muscular strain from slipping on the ice.   Patient is safe for discharge with strict return precautions. Patient is to follow-up with his PCP as needed.  Final diagnoses:  Left leg pain    He was seen in conjunction with Dr. Sanjuana Mae, MD 10/29/15 [phone]

## 2016-03-22 ENCOUNTER — Ambulatory Visit (HOSPITAL_COMMUNITY)
Admission: EM | Admit: 2016-03-22 | Discharge: 2016-03-22 | Disposition: A | Discharge status: Other Acute Inpatient Hospital | Payer: No Typology Code available for payment source | Attending: Emergency Medicine | Admitting: Emergency Medicine

## 2016-03-22 ENCOUNTER — Encounter (HOSPITAL_COMMUNITY): Payer: Self-pay

## 2016-03-22 ENCOUNTER — Emergency Department (HOSPITAL_COMMUNITY): Payer: Self-pay

## 2016-03-22 ENCOUNTER — Emergency Department (HOSPITAL_COMMUNITY)
Admission: EM | Admit: 2016-03-22 | Discharge: 2016-03-22 | Disposition: A | Discharge status: Home / Self Care | Payer: Self-pay | Attending: Emergency Medicine | Admitting: Emergency Medicine

## 2016-03-22 ENCOUNTER — Telehealth: Payer: Self-pay | Admitting: Family Medicine

## 2016-03-22 DIAGNOSIS — Z7901 Long term (current) use of anticoagulants: Secondary | ICD-10-CM | POA: Insufficient documentation

## 2016-03-22 DIAGNOSIS — R9431 Abnormal electrocardiogram [ECG] [EKG]: Secondary | ICD-10-CM

## 2016-03-22 DIAGNOSIS — R600 Localized edema: Secondary | ICD-10-CM | POA: Insufficient documentation

## 2016-03-22 DIAGNOSIS — Z7982 Long term (current) use of aspirin: Secondary | ICD-10-CM | POA: Insufficient documentation

## 2016-03-22 DIAGNOSIS — R609 Edema, unspecified: Secondary | ICD-10-CM

## 2016-03-22 DIAGNOSIS — Z86718 Personal history of other venous thrombosis and embolism: Secondary | ICD-10-CM

## 2016-03-22 DIAGNOSIS — K8020 Calculus of gallbladder without cholecystitis without obstruction: Secondary | ICD-10-CM | POA: Insufficient documentation

## 2016-03-22 DIAGNOSIS — R0789 Other chest pain: Secondary | ICD-10-CM | POA: Insufficient documentation

## 2016-03-22 DIAGNOSIS — I10 Essential (primary) hypertension: Secondary | ICD-10-CM

## 2016-03-22 DIAGNOSIS — R0609 Other forms of dyspnea: Secondary | ICD-10-CM

## 2016-03-22 DIAGNOSIS — R079 Chest pain, unspecified: Secondary | ICD-10-CM

## 2016-03-22 LAB — BASIC METABOLIC PANEL
ANION GAP: 6 (ref 5–15)
BUN: 13 mg/dL (ref 6–20)
CALCIUM: 8.9 mg/dL (ref 8.9–10.3)
CO2: 23 mmol/L (ref 22–32)
Chloride: 105 mmol/L (ref 101–111)
Creatinine, Ser: 1.14 mg/dL (ref 0.61–1.24)
Glucose, Bld: 151 mg/dL — ABNORMAL HIGH (ref 65–99)
Potassium: 4.4 mmol/L (ref 3.5–5.1)
Sodium: 134 mmol/L — ABNORMAL LOW (ref 135–145)

## 2016-03-22 LAB — URINE MICROSCOPIC-ADD ON

## 2016-03-22 LAB — CBC
HCT: 43.7 % (ref 39.0–52.0)
HEMOGLOBIN: 16.1 g/dL (ref 13.0–17.0)
MCH: 30.6 pg (ref 26.0–34.0)
MCHC: 36.8 g/dL — AB (ref 30.0–36.0)
MCV: 82.9 fL (ref 78.0–100.0)
PLATELETS: 171 10*3/uL (ref 150–400)
RBC: 5.27 MIL/uL (ref 4.22–5.81)
RDW: 13.1 % (ref 11.5–15.5)
WBC: 11.4 10*3/uL — ABNORMAL HIGH (ref 4.0–10.5)

## 2016-03-22 LAB — I-STAT TROPONIN, ED: Troponin i, poc: 0 ng/mL (ref 0.00–0.08)

## 2016-03-22 LAB — URINALYSIS, ROUTINE W REFLEX MICROSCOPIC
Bilirubin Urine: NEGATIVE
GLUCOSE, UA: 500 mg/dL — AB
HGB URINE DIPSTICK: NEGATIVE
KETONES UR: NEGATIVE mg/dL
Nitrite: NEGATIVE
PH: 5.5 (ref 5.0–8.0)
PROTEIN: NEGATIVE mg/dL
SPECIFIC GRAVITY, URINE: 1.041 — AB (ref 1.005–1.030)

## 2016-03-22 LAB — LIPASE, BLOOD: Lipase: 25 U/L (ref 11–51)

## 2016-03-22 MED ORDER — HYDROCODONE-ACETAMINOPHEN 5-325 MG PO TABS
1.0000 | ORAL_TABLET | ORAL | Status: DC | PRN
Start: 2016-03-22 — End: 2016-03-30

## 2016-03-22 MED ORDER — FAMOTIDINE 20 MG PO TABS: 20.0000 mg | ORAL_TABLET | Freq: Every day | ORAL | Status: DC

## 2016-03-22 MED ORDER — HYDROCHLOROTHIAZIDE 25 MG PO TABS: 25.0000 mg | ORAL_TABLET | Freq: Every day | ORAL | Status: DC

## 2016-03-22 MED ORDER — ASPIRIN 81 MG PO CHEW
CHEWABLE_TABLET | ORAL | Status: AC
  Filled 2016-03-22: qty 4

## 2016-03-22 MED ORDER — IOPAMIDOL (ISOVUE-370) INJECTION 76%
INTRAVENOUS | Status: AC
  Administered 2016-03-22: 100 mL
  Filled 2016-03-22: qty 100

## 2016-03-22 MED ORDER — NITROGLYCERIN 0.4 MG SL SUBL
0.4000 mg | SUBLINGUAL_TABLET | SUBLINGUAL | Status: DC | PRN
Start: 2016-03-22 — End: 2016-03-22
  Administered 2016-03-22: 0.4 mg via SUBLINGUAL

## 2016-03-22 MED ORDER — ASPIRIN 81 MG PO CHEW
324.0000 mg | CHEWABLE_TABLET | Freq: Once | ORAL | Status: AC
  Administered 2016-03-22: 324 mg via ORAL

## 2016-03-22 MED ORDER — NITROGLYCERIN 0.4 MG SL SUBL
SUBLINGUAL_TABLET | SUBLINGUAL | Status: AC
  Filled 2016-03-22: qty 1

## 2016-03-22 MED ORDER — SODIUM CHLORIDE 0.9 % IV SOLN
Freq: Once | INTRAVENOUS | Status: AC
  Administered 2016-03-22: 15:00:00 via INTRAVENOUS

## 2016-03-22 NOTE — ED Notes (Signed)
Patient presents to er from urgent care with chest pain x 3 days, the patient had 4 baby aspirin and 1 sl nitro with no relief, the patient also reports shortness of breath and swelling in both legs, the patient also have dvt's in both legs for 2 years that he is on xarelto for

## 2016-03-22 NOTE — ED Provider Notes (Signed)
CSN: [id number]     Arrival date & time 03/22/16  1352 History   First MD Initiated Contact with Patient 03/22/16 1418     Chief Complaint  Patient presents with  . Chest Pain   (Consider location/radiation/quality/duration/timing/severity/associated sxs/prior Treatment) HPI Comments: Morbidly obese 45 year old male with a stated history of hypertension, DVT, obesity presents to the urgent care with a 3 day history of midsternal chest pain. He describes it as sharp. It is sometimes worse with belching. It tends to come and go. He is currently having chest pain. He has no history of GI problems such as GERD or indigestion. Currently denies shortness of breath, nausea, vomiting or diaphoresis. He states he gets short of breath with minimal exertion. He has been advised to see his PCP regarding these chronic issues however he has declined.   Past Medical History  Diagnosis Date  . DVT (deep venous thrombosis) [facility](HCC)    Past Surgical History  Procedure Laterality Date  . Ligament repair      R forearm   Family History  Problem Relation Age of Onset  . Diabetes Mother   . Diabetes Father   . Hypertension Sister    Social History  Substance Use Topics  . Smoking status: Never Smoker   . Smokeless tobacco: Not on file  . Alcohol Use: No    Review of Systems  Constitutional: Positive for activity change. Negative for fever and diaphoresis.  HENT: Negative.   Respiratory: Positive for shortness of breath.        Dyspnea on exertion  Cardiovascular: Positive for chest pain and leg swelling.  Gastrointestinal: Negative for nausea, vomiting and abdominal pain.  Genitourinary: Negative.   Skin: Negative.   Neurological: Negative.     Allergies  Review of patient's allergies indicates no known allergies.  Home Medications   Prior to Admission medications   Medication Sig Start Date End Date Taking? Authorizing Provider  albuterol (PROVENTIL HFA;VENTOLIN HFA) 108 (90 Base) MCG/ACT  inhaler Inhale 2 puffs into the lungs every 4 (four) hours as needed for wheezing or shortness of breath. 10/11/15   Arby BarretteMarcy Pfeiffer, MD  aspirin 325 MG tablet Take 650 mg by mouth every 6 (six) hours as needed for mild pain.    Historical Provider, MD  azithromycin (ZITHROMAX Z-PAK) 250 MG tablet Take 1 tablet (250 mg total) by mouth daily. Patient not taking: Reported on 10/28/2015 10/11/15   Arby BarretteMarcy Pfeiffer, MD  gabapentin (NEURONTIN) 100 MG capsule Take 1 capsule (100 mg total) by mouth 3 (three) times daily. Patient not taking: Reported on 10/11/2015 12/08/14   Doris Cheadleeepak Advani, MD  HYDROcodone-homatropine (HYCODAN) 5-1.5 MG/5ML syrup Take 5 mLs by mouth every 6 (six) hours as needed for cough. You may take 5-10 mL every 6 hours as needed for cough. Patient not taking: Reported on 10/28/2015 10/11/15   Arby BarretteMarcy Pfeiffer, MD  predniSONE (DELTASONE) 50 MG tablet Take 1 pill daily for 5 days. Patient not taking: Reported on 10/28/2015 10/28/15   Charm RingsErin J Honig, MD  rivaroxaban (XARELTO) 20 MG TABS tablet Take 1 tablet (20 mg total) by mouth daily with supper. 12/27/14   Doris Cheadleeepak Advani, MD  Spacer/Aero-Holding Chambers (AEROCHAMBER PLUS WITH MASK) inhaler Use as instructed 10/11/15   Arby BarretteMarcy Pfeiffer, MD   Meds Ordered and Administered this Visit   Medications  0.9 %  sodium chloride infusion (not administered)  aspirin chewable tablet 324 mg (not administered)  nitroGLYCERIN (NITROSTAT) SL tablet 0.4 mg (not administered)    BP  121/80 mmHg  Pulse 108  Temp(Src) 98.9 F (37.2 C) (Oral)  Resp 16  SpO2 95% No data found.   Physical Exam  Constitutional: He is oriented to person, place, and time. He appears well-developed and well-nourished. No distress.  Eyes: Conjunctivae and EOM are normal.  Neck: Normal range of motion. Neck supple.  Cardiovascular: Regular rhythm and normal heart sounds.   Sinus tachycardia at 108  Pulmonary/Chest: Effort normal and breath sounds normal. No respiratory distress.   Abdominal: Soft. There is no tenderness.  Obese  Musculoskeletal: He exhibits edema.  Bilateral lower extremity edema  Neurological: He is alert and oriented to person, place, and time. He exhibits normal muscle tone.  Skin: Skin is warm and dry.  Psychiatric: He has a normal mood and affect.  Nursing note and vitals reviewed.   ED Course  Procedures (including critical care time)  Labs Review Labs Reviewed - No data to display  Imaging Review No results found. ED ECG REPORT   Date: 03/22/2016  Rate: 108  Rhythm: sinus tachycardia  QRS Axis: leftward  Intervals:   ST/T Wave abnormalities: nonspecific ST changes T-wave inversion in lead 1, aVL, V4, V5 and V6.  Conduction Disutrbances:nonspecific intraventricular conduction delay  Narrative Interpretation: The degree of T-wave inversion is less than that of the EKG in January 2017. No specific pattern suggesting acute ischemic changes on today's EKG. No ectopy home today's EKG as was present on the previous. Q waves in III and aVF are persistent.   Old EKG Reviewed: changes noted  I have personally reviewed the EKG tracing and agree with the computerized printout as noted.   Visual Acuity Review  Right Eye Distance:   Left Eye Distance:   Bilateral Distance:    Right Eye Near:   Left Eye Near:    Bilateral Near:         MDM   1. Chest pain, unspecified chest pain type   2. Essential hypertension   3. DOE (dyspnea on exertion)   4. Bilateral edema of lower extremity   5. Morbid obesity due to excess calories (HCC)   6. Abnormal EKG   7. History of DVT of lower extremity    Patient with multiple risk factors for embolic disease presents to the urgent care with chest pain for the past 4 days. This is associated with dyspnea on exertion, lower extremity edema, history of hypertension per patient's endorsement, obesity. Transfer to the emergency department via care Link. IV of normal saline EKG monitor Oxygen  2 L per nasal cannula Aspirin 324 mg by mouth Nitroglycerin sublingual 0.4 mg     Hayden Rasmussenavid Abhay Godbolt, NP 03/22/16 1819

## 2016-03-22 NOTE — Telephone Encounter (Signed)
Pt dropped off paperwork for consent to release medical records to [facility] Plasma Pt was informed that paperwork can take from 7-14 days Paperwork was placed in providers box

## 2016-03-22 NOTE — ED Notes (Signed)
Pt states that he has had chest pain for 3 days along with a headache Pt alert and oriented

## 2016-03-22 NOTE — ED Provider Notes (Signed)
CSN: [id number]     Arrival date & time 03/22/16  1540 History   First MD Initiated Contact with Patient 03/22/16 1552     Chief Complaint  Patient presents with  . Chest Pain   Pt is a 45 yo bm who comes from urgent care.  The pt said that it has been intermittent for the past 3 days.  The pt said that the pain occurs with belching.   He also said that he has swelling in bilateral LE.  Pt has a hx of bilateral DVT.  Pt has been on Xarelto and denies skipping any doses.  Pt given ASA and nitro at urgent care.  (Consider location/radiation/quality/duration/timing/severity/associated sxs/prior Treatment) Patient is a 45 y.o. male presenting with chest pain. The history is provided by the patient.  Chest Pain Pain location:  Epigastric Pain quality: sharp   Pain radiates to:  Does not radiate Pain radiates to the back: no   Pain severity:  Mild Onset quality:  Sudden Timing:  Intermittent Progression:  Waxing and waning Chronicity:  New Relieved by:  Nothing   Past Medical History  Diagnosis Date  . DVT (deep venous thrombosis) [facility])    Past Surgical History  Procedure Laterality Date  . Ligament repair      R forearm   Family History  Problem Relation Age of Onset  . Diabetes Mother   . Diabetes Father   . Hypertension Sister    Social History  Substance Use Topics  . Smoking status: Never Smoker   . Smokeless tobacco: None  . Alcohol Use: No    Review of Systems  Cardiovascular: Positive for chest pain.  All other systems reviewed and are negative.     Allergies  Review of patient's allergies indicates no known allergies.  Home Medications   Prior to Admission medications   Medication Sig Start Date End Date Taking? Authorizing Provider  albuterol (PROVENTIL HFA;VENTOLIN HFA) 108 (90 Base) MCG/ACT inhaler Inhale 2 puffs into the lungs every 4 (four) hours as needed for wheezing or shortness of breath. 10/11/15  Yes Arby Barrette, MD  aspirin 325 MG tablet  Take 650 mg by mouth every 6 (six) hours as needed for mild pain.   Yes Historical Provider, MD  rivaroxaban (XARELTO) 20 MG TABS tablet Take 1 tablet (20 mg total) by mouth daily with supper. 12/27/14  Yes Deepak Advani, MD  azithromycin (ZITHROMAX Z-PAK) 250 MG tablet Take 1 tablet (250 mg total) by mouth daily. Patient not taking: Reported on 10/28/2015 10/11/15   Arby Barrette, MD  famotidine (PEPCID) 20 MG tablet Take 1 tablet (20 mg total) by mouth daily. 03/22/16   Jacalyn Lefevre, MD  gabapentin (NEURONTIN) 100 MG capsule Take 1 capsule (100 mg total) by mouth 3 (three) times daily. Patient not taking: Reported on 10/11/2015 12/08/14   Doris Cheadle, MD  hydrochlorothiazide (HYDRODIURIL) 25 MG tablet Take 1 tablet (25 mg total) by mouth daily. 03/22/16   Jacalyn Lefevre, MD  HYDROcodone-acetaminophen (NORCO/VICODIN) 5-325 MG tablet Take 1 tablet by mouth every 4 (four) hours as needed. 03/22/16   Jacalyn Lefevre, MD  HYDROcodone-homatropine [facility]) 5-1.5 MG/5ML syrup Take 5 mLs by mouth every 6 (six) hours as needed for cough. You may take 5-10 mL every 6 hours as needed for cough. Patient not taking: Reported on 10/28/2015 10/11/15   Arby Barrette, MD  predniSONE (DELTASONE) 50 MG tablet Take 1 pill daily for 5 days. Patient not taking: Reported on 10/28/2015 10/28/15   Denny Peon  Chip BoerJ Honig, MD  Spacer/Aero-Holding Chambers (AEROCHAMBER PLUS WITH MASK) inhaler Use as instructed 10/11/15   Arby BarretteMarcy Pfeiffer, MD   BP 142/90 mmHg  Pulse 81  Temp(Src) 98.6 F (37 C) (Oral)  Resp 19  SpO2 98% Physical Exam  Constitutional: He is oriented to person, place, and time. He appears well-developed and well-nourished.  HENT:  Head: Normocephalic and atraumatic.  Right Ear: External ear normal.  Left Ear: External ear normal.  Nose: Nose normal.  Mouth/Throat: Oropharynx is clear and moist.  Eyes: Conjunctivae and EOM are normal. Pupils are equal, round, and reactive to light.  Neck: Normal range of motion. Neck  supple.  Cardiovascular: Normal rate, regular rhythm, normal heart sounds and intact distal pulses.   Pulmonary/Chest: Effort normal and breath sounds normal.  Abdominal: Soft. Bowel sounds are normal. There is tenderness in the epigastric area.  Musculoskeletal: Normal range of motion.  Neurological: He is alert and oriented to person, place, and time.  Skin: Skin is warm and dry.  Psychiatric: He has a normal mood and affect. His behavior is normal. Judgment and thought content normal.  Nursing note and vitals reviewed.   ED Course  Procedures (including critical care time) Labs Review Labs Reviewed  BASIC METABOLIC PANEL - Abnormal; Notable for the following:    Sodium 134 (*)    Glucose, Bld 151 (*)    All other components within normal limits  CBC - Abnormal; Notable for the following:    WBC 11.4 (*)    MCHC 36.8 (*)    All other components within normal limits  LIPASE, BLOOD  URINALYSIS, ROUTINE W REFLEX MICROSCOPIC (NOT [facility])  I-STAT TROPOININ, ED    Imaging Review Ct Angio Chest Pe W/cm &/or Wo Cm  03/22/2016  CLINICAL DATA:  Chest pain and shortness of breath for 3 days. Foot swelling. History of DVT. EXAM: CT ANGIOGRAPHY CHEST WITH CONTRAST TECHNIQUE: Multidetector CT imaging of the chest was performed using the standard protocol during bolus administration of intravenous contrast. Multiplanar CT image reconstructions and MIPs were obtained to evaluate the vascular anatomy. CONTRAST:  80 cc Isovue 370 IV COMPARISON:  Most recent chest radiograph 10/11/2015. FINDINGS: Cardiovascular: Technically limited study due to phase of contrast and soft tissue attenuation from body habitus. There are no filling defects within the central pulmonary arteries to suggest pulmonary embolus. The distal segmental and subsegmental branches are suboptimally assessed, particularly in the lower lobes where there is symmetric heterogeneous density felt to be related to phase of contrast. No  periaortic soft tissue stranding, limited assessment for dissection given phase of contrast. Mediastinum/Nodes: No adenopathy or mass.  No pericardial fluid. Lungs/Pleura: Subsegmental atelectasis in the left lower lobe. No consolidation, pulmonary edema or pleural effusion. No pulmonary mass or suspicious nodule. Upper Abdomen: Calcified gallstones. Fat density 1.5 cm left adrenal nodule consistent with adenoma. Small hiatal hernia. Musculoskeletal: There are no acute or suspicious osseous abnormalities. Review of the MIP images confirms the above findings. IMPRESSION: 1. No central pulmonary embolus.  No acute intrathoracic process. 2. Cholelithiasis, incidentally noted. Electronically Signed   By: Rubye OaksMelanie  Ehinger M.D.   On: 03/22/2016 18:18   I have personally reviewed and evaluated these images and lab results as part of my medical decision-making.   EKG Interpretation   Date/Time:  Thursday March 22 2016 16:00:14 EDT Ventricular Rate:  86 PR Interval:    QRS Duration: 127 QT Interval:  343 QTC Calculation: 411 R Axis:   21 Text Interpretation:  Sinus rhythm Left bundle branch block Baseline  wander in lead(s) V2 No significant change since last tracing Confirmed by  [facility] MD, Ashaki Frosch ([id number]) on 03/22/2016 4:03:07 PM      MDM  Pt is feeling better.  Pt's sx are very atypical for CAD.  I think his sx are from the cholelithiasis seen on CT chest.  I discussed a better diet with low salt and whole foods to help with biliary colic and with peripheral edema.  Pt also told of BS of 151 and that he needs to follow that with his pcp.  Pt knows to return if worse. Final diagnoses:  Peripheral edema  Calculus of gallbladder without cholecystitis without obstruction  Atypical chest pain       Jacalyn LefevreJulie Kia Stavros, MD 03/22/16 [phone]

## 2016-03-27 ENCOUNTER — Encounter: Payer: Self-pay | Admitting: Family Medicine

## 2016-03-27 NOTE — Telephone Encounter (Signed)
[facility] plasma form received Chart reviewed,  Patient with uncontrolled HTN and multiple ED visit. I recommended that he not be allowed to donate plasma until he has a follow-up office visit to address his HTN. He has an appt with Dr. Venetia NightAmao on 04/09/16.  [facility] plasma form to be faxed, please call for fax # as it was not on the form.

## 2016-03-27 NOTE — Telephone Encounter (Signed)
Form faxed [phone]

## 2016-03-29 ENCOUNTER — Ambulatory Visit: Payer: No Typology Code available for payment source | Attending: Internal Medicine

## 2016-03-29 ENCOUNTER — Encounter (HOSPITAL_COMMUNITY): Payer: Self-pay | Admitting: *Deleted

## 2016-03-29 DIAGNOSIS — K8020 Calculus of gallbladder without cholecystitis without obstruction: Secondary | ICD-10-CM | POA: Insufficient documentation

## 2016-03-29 DIAGNOSIS — B029 Zoster without complications: Secondary | ICD-10-CM | POA: Insufficient documentation

## 2016-03-29 DIAGNOSIS — Z7982 Long term (current) use of aspirin: Secondary | ICD-10-CM | POA: Insufficient documentation

## 2016-03-29 DIAGNOSIS — Z7901 Long term (current) use of anticoagulants: Secondary | ICD-10-CM | POA: Insufficient documentation

## 2016-03-29 LAB — COMPREHENSIVE METABOLIC PANEL
ALT: 43 U/L (ref 17–63)
ANION GAP: 8 (ref 5–15)
AST: 17 U/L (ref 15–41)
Albumin: 3.9 g/dL (ref 3.5–5.0)
Alkaline Phosphatase: 59 U/L (ref 38–126)
BUN: 11 mg/dL (ref 6–20)
CHLORIDE: 99 mmol/L — AB (ref 101–111)
CO2: 28 mmol/L (ref 22–32)
Calcium: 9.1 mg/dL (ref 8.9–10.3)
Creatinine, Ser: 1.35 mg/dL — ABNORMAL HIGH (ref 0.61–1.24)
Glucose, Bld: 126 mg/dL — ABNORMAL HIGH (ref 65–99)
POTASSIUM: 3.6 mmol/L (ref 3.5–5.1)
Sodium: 135 mmol/L (ref 135–145)
Total Bilirubin: 0.8 mg/dL (ref 0.3–1.2)
Total Protein: 7.1 g/dL (ref 6.5–8.1)

## 2016-03-29 LAB — URINALYSIS, ROUTINE W REFLEX MICROSCOPIC
BILIRUBIN URINE: NEGATIVE
Glucose, UA: NEGATIVE mg/dL
HGB URINE DIPSTICK: NEGATIVE
KETONES UR: NEGATIVE mg/dL
Leukocytes, UA: NEGATIVE
Nitrite: NEGATIVE
Protein, ur: NEGATIVE mg/dL
SPECIFIC GRAVITY, URINE: 1.024 (ref 1.005–1.030)
pH: 5.5 (ref 5.0–8.0)

## 2016-03-29 LAB — CBC
HEMATOCRIT: 42.8 % (ref 39.0–52.0)
HEMOGLOBIN: 15.3 g/dL (ref 13.0–17.0)
MCH: 29.4 pg (ref 26.0–34.0)
MCHC: 35.7 g/dL (ref 30.0–36.0)
MCV: 82.1 fL (ref 78.0–100.0)
PLATELETS: 190 10*3/uL (ref 150–400)
RBC: 5.21 MIL/uL (ref 4.22–5.81)
RDW: 13.1 % (ref 11.5–15.5)
WBC: 12.9 10*3/uL — AB (ref 4.0–10.5)

## 2016-03-29 LAB — LIPASE, BLOOD: LIPASE: 26 U/L (ref 11–51)

## 2016-03-29 NOTE — ED Notes (Signed)
Pt c/o pain from his gallstones today and a rash to his neck since Monday. Denies N/V/D.

## 2016-03-30 ENCOUNTER — Emergency Department (HOSPITAL_COMMUNITY)
Admission: EM | Admit: 2016-03-30 | Discharge: 2016-03-30 | Disposition: A | Discharge status: Home / Self Care | Payer: Self-pay | Attending: Emergency Medicine | Admitting: Emergency Medicine

## 2016-03-30 ENCOUNTER — Emergency Department (HOSPITAL_COMMUNITY): Payer: Self-pay

## 2016-03-30 DIAGNOSIS — R1011 Right upper quadrant pain: Secondary | ICD-10-CM

## 2016-03-30 DIAGNOSIS — K8020 Calculus of gallbladder without cholecystitis without obstruction: Secondary | ICD-10-CM

## 2016-03-30 DIAGNOSIS — B029 Zoster without complications: Secondary | ICD-10-CM

## 2016-03-30 MED ORDER — MORPHINE SULFATE (PF) 4 MG/ML IV SOLN
4.0000 mg | Freq: Once | INTRAVENOUS | Status: AC
  Administered 2016-03-30: 4 mg via INTRAVENOUS
  Filled 2016-03-30: qty 1

## 2016-03-30 MED ORDER — ONDANSETRON HCL 4 MG PO TABS: 4.0000 mg | ORAL_TABLET | Freq: Four times a day (QID) | ORAL | Status: DC

## 2016-03-30 MED ORDER — HYDROCODONE-ACETAMINOPHEN 5-325 MG PO TABS: 1.0000 | ORAL_TABLET | Freq: Four times a day (QID) | ORAL | Status: DC | PRN

## 2016-03-30 MED ORDER — ACYCLOVIR 800 MG PO TABS: 800.0000 mg | ORAL_TABLET | Freq: Every day | ORAL | Status: DC

## 2016-03-30 NOTE — ED Provider Notes (Signed)
CSN: [id number]     Arrival date & time 03/29/16  2135 History   First MD Initiated Contact with Patient 03/30/16 0122     Chief Complaint  Patient presents with  . Abdominal Pain  . Rash     (Consider location/radiation/quality/duration/timing/severity/associated sxs/prior Treatment) HPI Comments: Patient presents today with a chief complaint of RUQ abdominal pain.  He reports onset earlier today.  Pain worsens after eating.  Pain radiates around to the back.   He has not taken anything for pain prior to arrival.  He denies nausea, vomiting, fever, chills, chest pain, SOB, or any other symptoms.  He was seen in the ED for chest pain eight days ago and had a CT angio chest to rule out PE.  CT angio showed Cholelithiasis.    He also is complaining of a rash of the right lateral/posteior neck that he also noticed today.  He states that the rash is painful.  No SOB or swelling of the lips, tongue, or mouth.  Rash does not itch.  He has not tried any medications for the rash.  No new medications, soaps, lotions, or detergents.  No known exposure to [employer].    Patient is a 45 y.o. male presenting with abdominal pain and rash. The history is provided by the patient.  Abdominal Pain Rash Associated symptoms: abdominal pain     Past Medical History  Diagnosis Date  . DVT (deep venous thrombosis) [facility])    Past Surgical History  Procedure Laterality Date  . Ligament repair      R forearm   Family History  Problem Relation Age of Onset  . Diabetes Mother   . Diabetes Father   . Hypertension Sister    Social History  Substance Use Topics  . Smoking status: Never Smoker   . Smokeless tobacco: None  . Alcohol Use: No    Review of Systems  Gastrointestinal: Positive for abdominal pain.  Skin: Positive for rash.  All other systems reviewed and are negative.     Allergies  Review of patient's allergies indicates no known allergies.  Home Medications   Prior to Admission  medications   Medication Sig Start Date End Date Taking? Authorizing Provider  albuterol (PROVENTIL HFA;VENTOLIN HFA) 108 (90 Base) MCG/ACT inhaler Inhale 2 puffs into the lungs every 4 (four) hours as needed for wheezing or shortness of breath. 10/11/15  Yes Arby Barrette, MD  aspirin 325 MG tablet Take 650 mg by mouth every 6 (six) hours as needed for mild pain.   Yes Historical Provider, MD  famotidine (PEPCID) 20 MG tablet Take 1 tablet (20 mg total) by mouth daily. 03/22/16  Yes Jacalyn Lefevre, MD  hydrochlorothiazide (HYDRODIURIL) 25 MG tablet Take 1 tablet (25 mg total) by mouth daily. 03/22/16  Yes Jacalyn Lefevre, MD  HYDROcodone-acetaminophen (NORCO/VICODIN) 5-325 MG tablet Take 1 tablet by mouth every 4 (four) hours as needed. 03/22/16  Yes Jacalyn Lefevre, MD  rivaroxaban (XARELTO) 20 MG TABS tablet Take 1 tablet (20 mg total) by mouth daily with supper. 12/27/14  Yes Deepak Advani, MD  azithromycin (ZITHROMAX Z-PAK) 250 MG tablet Take 1 tablet (250 mg total) by mouth daily. Patient not taking: Reported on 10/28/2015 10/11/15   Arby Barrette, MD  gabapentin (NEURONTIN) 100 MG capsule Take 1 capsule (100 mg total) by mouth 3 (three) times daily. Patient not taking: Reported on 10/11/2015 12/08/14   Doris Cheadle, MD  HYDROcodone-homatropine (HYCODAN) 5-1.5 MG/5ML syrup Take 5 mLs by mouth every 6 (  six) hours as needed for cough. You may take 5-10 mL every 6 hours as needed for cough. Patient not taking: Reported on 10/28/2015 10/11/15   Arby Barrette, MD  predniSONE (DELTASONE) 50 MG tablet Take 1 pill daily for 5 days. Patient not taking: Reported on 10/28/2015 10/28/15   Charm Rings, MD  Spacer/Aero-Holding Chambers (AEROCHAMBER PLUS WITH MASK) inhaler Use as instructed 10/11/15   Arby Barrette, MD   BP 139/81 mmHg  Pulse 81  Temp(Src) 99.5 F (37.5 C) (Oral)  Resp 18  Ht 5\' 9"  (1.753 m)  Wt 132.042 kg  BMI 42.97 kg/m2  SpO2 97% Physical Exam  Constitutional: He appears well-developed and  well-nourished.  HENT:  Head: Normocephalic and atraumatic.  Mouth/Throat: Oropharynx is clear and moist.  Neck: Normal range of motion. Neck supple.  Cardiovascular: Normal rate, regular rhythm and normal heart sounds.   Pulmonary/Chest: Effort normal and breath sounds normal.  Abdominal: Soft. Bowel sounds are normal. He exhibits no distension and no mass. There is tenderness in the right upper quadrant. There is no rebound and no guarding.  Musculoskeletal: Normal range of motion.  Neurological: He is alert.  Skin: Skin is warm and dry. Rash noted.  Erythematous papular rash of the right side of the neck in a linear dermatomal pattern.    Psychiatric: He has a normal mood and affect.  Nursing note and vitals reviewed.   ED Course  Procedures (including critical care time) Labs Review Labs Reviewed  COMPREHENSIVE METABOLIC PANEL - Abnormal; Notable for the following:    Chloride 99 (*)    Glucose, Bld 126 (*)    Creatinine, Ser 1.35 (*)    All other components within normal limits  CBC - Abnormal; Notable for the following:    WBC 12.9 (*)    All other components within normal limits  LIPASE, BLOOD  URINALYSIS, ROUTINE W REFLEX MICROSCOPIC (NOT AT [facility])    Imaging Review US Abdomen Limited Ruq  03/30/2016  CLINICAL DATA:  45 year old male with right upper quadrant abdominal pain EXAM: US ABDOMEN LIMITED - RIGHT UPPER QUADRANT COMPARISON:  None. FINDINGS: Gallbladder: There multiple stones within the gallbladder. Nonmobile stone noted at the gallbladder neck. No gallbladder wall thickening or pericholecystic fluid. Tenderness was elicited over the gallbladder area during scanning. Common bile duct: Diameter: 4 mm Liver: There is diffuse increased hepatic echogenicity compatible with fatty infiltration. IMPRESSION: Cholelithiasis without sonographic evidence acute cholecystitis. A hepatobiliary scintigraphy may provide better evaluation of the gallbladder if an acute cholecystitis  is clinically suspected. Fatty liver. Electronically Signed   By: Elgie Collard M.D.   On: 03/30/2016 02:55   I have personally reviewed and evaluated these images and lab results as part of my medical decision-making.   EKG Interpretation None      MDM   Final diagnoses:  RUQ abdominal pain   Patient presents today with a chief complaint of RUQ abdominal pain onset earlier today.  Pain worse with eating.  RUQ ultrasound showing Cholelithiasis without signs of cholecystitis.  Labs unremarkable.  Pain and nausea controlled in the ED.  Patient given referral to [facility] to follow up with if pain continues.   Patient also presenting to a painful rash of the right side of the neck.  It does not cross midline and appears to be in a dermatomal pattern.  Therefore, suspicious for Shingles.  Patient started on Acyclovir.  Stable for discharge.  Return precautions given.      Santiago Glad, PA-C  04/01/16 1629  Tomasita CrumbleAdeleke Oni, MD 04/01/16 2340

## 2016-03-30 NOTE — ED Notes (Signed)
Pt verbalized understanding of discharge instructions and follow-up care. NAD noted at time of discharge. 

## 2016-04-09 ENCOUNTER — Encounter: Payer: Self-pay | Admitting: Family Medicine

## 2016-04-09 ENCOUNTER — Ambulatory Visit: Payer: No Typology Code available for payment source | Attending: Family Medicine | Admitting: Family Medicine

## 2016-04-09 VITALS — BP 160/102 | HR 103 | Temp 98.8°F | Ht 69.0 in | Wt 289.6 lb

## 2016-04-09 DIAGNOSIS — Z7982 Long term (current) use of aspirin: Secondary | ICD-10-CM | POA: Insufficient documentation

## 2016-04-09 DIAGNOSIS — K8020 Calculus of gallbladder without cholecystitis without obstruction: Secondary | ICD-10-CM | POA: Insufficient documentation

## 2016-04-09 DIAGNOSIS — F321 Major depressive disorder, single episode, moderate: Secondary | ICD-10-CM

## 2016-04-09 DIAGNOSIS — I82503 Chronic embolism and thrombosis of unspecified deep veins of lower extremity, bilateral: Secondary | ICD-10-CM | POA: Insufficient documentation

## 2016-04-09 DIAGNOSIS — F329 Major depressive disorder, single episode, unspecified: Secondary | ICD-10-CM | POA: Insufficient documentation

## 2016-04-09 DIAGNOSIS — G44209 Tension-type headache, unspecified, not intractable: Secondary | ICD-10-CM

## 2016-04-09 DIAGNOSIS — Z79899 Other long term (current) drug therapy: Secondary | ICD-10-CM | POA: Insufficient documentation

## 2016-04-09 DIAGNOSIS — I10 Essential (primary) hypertension: Secondary | ICD-10-CM | POA: Insufficient documentation

## 2016-04-09 MED ORDER — FLUOXETINE HCL 20 MG PO TABS: 20.0000 mg | ORAL_TABLET | Freq: Every day | ORAL | 3 refills | Status: DC

## 2016-04-09 MED ORDER — LISINOPRIL 10 MG PO TABS: 10.0000 mg | ORAL_TABLET | Freq: Every day | ORAL | 3 refills | Status: DC

## 2016-04-09 MED ORDER — HYDROCHLOROTHIAZIDE 25 MG PO TABS: 25.0000 mg | ORAL_TABLET | Freq: Every day | ORAL | 3 refills | Status: DC

## 2016-04-09 MED FILL — ?FLUOXETINE HCL 20MG TABLET: 20 | 30 days supply | Qty: 30 | Fill #0

## 2016-04-09 MED FILL — HYDROCHLOROTHIAZIDE 25 MG T: 25 | 30 days supply | Qty: 30 | Fill #0

## 2016-04-09 MED FILL — ?LISINOPRIL 10 MG TABLET: 10 | 30 days supply | Qty: 30 | Fill #0

## 2016-04-09 NOTE — Progress Notes (Signed)
Follow up - hypertension Headaches for the last "couple of days"

## 2016-04-09 NOTE — Progress Notes (Signed)
Subjective:  Patient ID: David Dunlap, male    DOB: Nov 02, 1970  Age: 45 y.o. 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  He was seen at the ED 2 weeks ago for abdominal pain where an abdominal ultrasound revealed cholelithiasis without cholecystitis; he also had a rash at the time which was treated presumptively for shingles but the patient states he never took any of the medications listed as the rash resolved 2 days later. He denies abdominal pain at this time but would still like to be referred to a general surgeon.  He is requesting completion of a form for donation of plasma but informs me that he had been refused the last couple of times due to elevated blood pressure for which he remains on hydrochlorothiazide. He also has intermittent headaches but no blurry vision, nausea or chest pains.  He endorses some depression due to the fact that his nose that workup in contact and his income is limited but denies suicidal ideation or attempts.  Past Medical History:  Diagnosis Date  . DVT (deep venous thrombosis) (HCC)     Past Surgical History:  Procedure Laterality Date  . LIGAMENT REPAIR     R forearm     Outpatient Medications Prior to Visit  Medication Sig Dispense Refill  . albuterol (PROVENTIL HFA;VENTOLIN HFA) 108 (90 Base) MCG/ACT inhaler Inhale 2 puffs into the lungs every 4 (four) hours as needed for wheezing or shortness of breath. 1 Inhaler 0  . aspirin 325 MG tablet Take 650 mg by mouth every 6 (six) hours as needed for mild pain.    . rivaroxaban (XARELTO) 20 MG TABS tablet Take 1 tablet (20 mg total) by mouth daily with supper. 90 tablet 3  . hydrochlorothiazide (HYDRODIURIL) 25 MG tablet Take 1 tablet (25 mg total) by mouth daily. 30 tablet 0  . famotidine (PEPCID) 20 MG  tablet Take 1 tablet (20 mg total) by mouth daily. (Patient not taking: Reported on 04/09/2016) 30 tablet 0  . Spacer/Aero-Holding Chambers (AEROCHAMBER PLUS WITH MASK) inhaler Use as instructed (Patient not taking: Reported on 04/09/2016) 1 each 2  . acyclovir (ZOVIRAX) 800 MG tablet Take 1 tablet (800 mg total) by mouth 5 (five) times daily. 35 tablet 0  . azithromycin (ZITHROMAX Z-PAK) 250 MG tablet Take 1 tablet (250 mg total) by mouth daily. (Patient not taking: Reported on 10/28/2015) 4 tablet 0  . gabapentin (NEURONTIN) 100 MG capsule Take 1 capsule (100 mg total) by mouth 3 (three) times daily. (Patient not taking: Reported on 10/11/2015) 90 capsule 3  . HYDROcodone-acetaminophen (NORCO/VICODIN) 5-325 MG tablet Take 1-2 tablets by mouth every 6 (six) hours as needed. (Patient not taking: Reported on 04/09/2016) 12 tablet 0  . HYDROcodone-homatropine (HYCODAN) 5-1.5 MG/5ML syrup Take 5 mLs by mouth every 6 (six) hours as needed for cough. You may take 5-10 mL every 6 hours as needed for cough. (Patient not taking: Reported on 10/28/2015) 120 mL 0  . ondansetron (ZOFRAN) 4 MG tablet Take 1 tablet (4 mg total) by mouth every 6 (six) hours. 12 tablet 0  . predniSONE (DELTASONE) 50 MG tablet Take 1 pill daily for 5 days. (Patient not taking: Reported on 10/28/2015) 5 tablet 0   No facility-administered medications prior to visit.     ROS Review of Systems  Constitutional: Negative for activity change and appetite change.  HENT: Negative for sinus pressure and sore throat.   Eyes: Negative for visual disturbance.  Respiratory: Negative for cough, chest tightness and shortness of breath.   Cardiovascular: Negative for chest pain and leg swelling.  Gastrointestinal: Negative for abdominal distention, abdominal pain, constipation and diarrhea.  Endocrine: Negative.   Genitourinary: Negative for dysuria.  Musculoskeletal: Negative for joint swelling and myalgias.  Skin: Negative for rash.    Allergic/Immunologic: Negative.   Neurological: Negative for weakness, light-headedness and numbness.  Psychiatric/Behavioral: Negative for dysphoric mood and suicidal ideas.    Objective:  BP (!) 160/102 (BP Location: Right Arm, Patient Position: Sitting, Cuff Size: Large)   Pulse (!) 103   Temp 98.8 F (37.1 C) (Oral)   Ht 5\' 9"  (1.753 m)   Wt 289 lb 9.6 oz (131.4 kg)   SpO2 93%   BMI 42.77 kg/m   BP/Weight 04/09/2016 03/30/2016 03/29/2016  Systolic BP 160 128 -  Diastolic BP 102 85 -  Wt. (Lbs) 289.6 - 291.1  BMI 42.77 42.97 -      Physical Exam  Constitutional: He is oriented to person, place, and time. He appears well-developed and well-nourished.  Obese  Cardiovascular: Normal heart sounds and intact distal pulses.  Tachycardia present.   No murmur heard. Pulmonary/Chest: Effort normal and breath sounds normal. He has no wheezes. He has no rales. He exhibits no tenderness.  Abdominal: Soft. Bowel sounds are normal. He exhibits no distension and no mass. There is no tenderness.  Musculoskeletal: Normal range of motion.  Neurological: He is alert and oriented to person, place, and time.    CMP Latest Ref Rng & Units 03/29/2016 03/22/2016 10/28/2015  Glucose 65 - 99 mg/dL 161(W) 960(A) 540(J)  BUN 6 - 20 mg/dL 11 13 14   Creatinine 0.61 - 1.24 mg/dL 8.11(B) 1.47 8.29  Sodium 135 - 145 mmol/L 135 134(L) 138  Potassium 3.5 - 5.1 mmol/L 3.6 4.4 4.4  Chloride 101 - 111 mmol/L 99(L) 105 102  CO2 22 - 32 mmol/L 28 23 27   Calcium 8.9 - 10.3 mg/dL 9.1 8.9 5.6(O)  Total Protein 6.5 - 8.1 g/dL 7.1 - 5.7(L)  Total Bilirubin 0.3 - 1.2 mg/dL 0.8 - 0.6  Alkaline Phos 38 - 126 U/L 59 - 45  AST 15 - 41 U/L 17 - 11(L)  ALT 17 - 63 U/L 43 - 30    EXAM: US ABDOMEN LIMITED - RIGHT UPPER QUADRANT  COMPARISON:  None.  FINDINGS: Gallbladder:  There multiple stones within the gallbladder. Nonmobile stone noted at the gallbladder neck. No gallbladder wall thickening  or pericholecystic fluid. Tenderness was elicited over the gallbladder area during scanning.  Common bile duct:  Diameter: 4 mm  Liver:  There is diffuse increased hepatic echogenicity compatible with fatty infiltration.  IMPRESSION: Cholelithiasis without sonographic evidence acute cholecystitis. A hepatobiliary scintigraphy may provide better evaluation of the gallbladder if an acute cholecystitis is clinically suspected.  Fatty liver.   Electronically Signed   By: Elgie Collard M.D.   On: 03/30/2016 02:55  Assessment & Plan:   1. Essential hypertension Controlled Lisinopril added to regimen We'll repeat BP at next visit and if controlled complete his form for donation of plasma - hydrochlorothiazide (HYDRODIURIL) 25 MG tablet; Take 1 tablet (25 mg total) by mouth daily.  Dispense: 30 tablet; Refill: 3 - lisinopril (PRINIVIL,ZESTRIL) 10 MG tablet; Take 1 tablet (10 mg total) by mouth daily.  Dispense: 30 tablet; Refill: 3  2. 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 Calculus of gallbladder without cholecystitis without obstruction Pain at this time I have informed him that if pain persist I will be happy to refer him to a general surgeon however he has been advised to work and [employer] discount to facilitate this referral  4. Major depressive disorder, single episode, moderate (HCC) Situational Discussed onset of action of Prozac - FLUoxetine (PROZAC) 20 MG tablet; Take 1 tablet (20 mg total) by mouth daily.  Dispense: 30 tablet; Refill: 3  5. Tension-type headache, not intractable, unspecified chronicity pattern Likely due to uncontrolled blood pressure We'll reassess for improvement at next visit   Meds ordered this encounter  Medications  . hydrochlorothiazide (HYDRODIURIL) 25 MG tablet    Sig: Take 1 tablet (25 mg total) by mouth daily.    Dispense:  30 tablet    Refill:  3   . lisinopril (PRINIVIL,ZESTRIL) 10 MG tablet    Sig: Take 1 tablet (10 mg total) by mouth daily.    Dispense:  30 tablet    Refill:  3  . FLUoxetine (PROZAC) 20 MG tablet    Sig: Take 1 tablet (20 mg total) by mouth daily.    Dispense:  30 tablet    Refill:  3    Follow-up: Return in about 1 week (around 04/16/2016) for Follow-up on hypertension.   Jaclyn Shaggy MD

## 2016-04-09 NOTE — Patient Instructions (Signed)
Hypertension Hypertension, commonly called high blood pressure, is when the force of blood pumping through your arteries is too strong. Your arteries are the blood vessels that carry blood from your heart throughout your body. A blood pressure reading consists of a higher number over a lower number, such as 110/72. The higher number (systolic) is the pressure inside your arteries when your heart pumps. The lower number (diastolic) is the pressure inside your arteries when your heart relaxes. Ideally you want your blood pressure below 120/80. Hypertension forces your heart to work harder to pump blood. Your arteries may become narrow or stiff. Having untreated or uncontrolled hypertension can cause heart attack, stroke, kidney disease, and other problems. RISK FACTORS Some risk factors for high blood pressure are controllable. Others are not.  Risk factors you cannot control include:   Race. You may be at higher risk if you are African American.  Age. Risk increases with age.  Gender. Men are at higher risk than women before age 45 years. After age 65, women are at higher risk than men. Risk factors you can control include:  Not getting enough exercise or physical activity.  Being overweight.  Getting too much fat, sugar, calories, or salt in your diet.  Drinking too much alcohol. SIGNS AND SYMPTOMS Hypertension does not usually cause signs or symptoms. Extremely high blood pressure (hypertensive crisis) may cause headache, anxiety, shortness of breath, and nosebleed. DIAGNOSIS To check if you have hypertension, your health care provider will measure your blood pressure while you are seated, with your arm held at the level of your heart. It should be measured at least twice using the same arm. Certain conditions can cause a difference in blood pressure between your right and left arms. A blood pressure reading that is higher than normal on one occasion does not mean that you need treatment. If  it is not clear whether you have high blood pressure, you may be asked to return on a different day to have your blood pressure checked again. Or, you may be asked to monitor your blood pressure at home for 1 or more weeks. TREATMENT Treating high blood pressure includes making lifestyle changes and possibly taking medicine. Living a healthy lifestyle can help lower high blood pressure. You may need to change some of your habits. Lifestyle changes may include:  Following the DASH diet. This diet is high in fruits, vegetables, and whole grains. It is low in salt, red meat, and added sugars.  Keep your sodium intake below 2,300 mg per day.  Getting at least 30-45 minutes of aerobic exercise at least 4 times per week.  Losing weight if necessary.  Not smoking.  Limiting alcoholic beverages.  Learning ways to reduce stress. Your health care provider may prescribe medicine if lifestyle changes are not enough to get your blood pressure under control, and if one of the following is true:  You are 18-59 years of age and your systolic blood pressure is above 140.  You are 60 years of age or older, and your systolic blood pressure is above 150.  Your diastolic blood pressure is above 90.  You have diabetes, and your systolic blood pressure is over 140 or your diastolic blood pressure is over 90.  You have kidney disease and your blood pressure is above 140/90.  You have heart disease and your blood pressure is above 140/90. Your personal target blood pressure may vary depending on your medical conditions, your age, and other factors. HOME CARE INSTRUCTIONS    Have your blood pressure rechecked as directed by your health care provider.   Take medicines only as directed by your health care provider. Follow the directions carefully. Blood pressure medicines must be taken as prescribed. The medicine does not work as well when you skip doses. Skipping doses also puts you at risk for  problems.  Do not smoke.   Monitor your blood pressure at home as directed by your health care provider. SEEK MEDICAL CARE IF:   You think you are having a reaction to medicines taken.  You have recurrent headaches or feel dizzy.  You have swelling in your ankles.  You have trouble with your vision. SEEK IMMEDIATE MEDICAL CARE IF:  You develop a severe headache or confusion.  You have unusual weakness, numbness, or feel faint.  You have severe chest or abdominal pain.  You vomit repeatedly.  You have trouble breathing. MAKE SURE YOU:   Understand these instructions.  Will watch your condition.  Will get help right away if you are not doing well or get worse.   This information is not intended to replace advice given to you by your health care provider. Make sure you discuss any questions you have with your health care provider.   Document Released: 09/03/2005 Document Revised: 01/18/2015 Document Reviewed: 06/26/2013 Elsevier Interactive Patient Education 2016 [employer]  

## 2016-04-17 ENCOUNTER — Ambulatory Visit: Payer: No Typology Code available for payment source | Attending: Family Medicine | Admitting: Family Medicine

## 2016-04-17 ENCOUNTER — Encounter: Payer: Self-pay | Admitting: Family Medicine

## 2016-04-17 VITALS — BP 129/84 | HR 82 | Temp 99.0°F | Ht 69.0 in | Wt 290.0 lb

## 2016-04-17 DIAGNOSIS — Z7982 Long term (current) use of aspirin: Secondary | ICD-10-CM | POA: Insufficient documentation

## 2016-04-17 DIAGNOSIS — K8020 Calculus of gallbladder without cholecystitis without obstruction: Secondary | ICD-10-CM | POA: Insufficient documentation

## 2016-04-17 DIAGNOSIS — I10 Essential (primary) hypertension: Secondary | ICD-10-CM | POA: Insufficient documentation

## 2016-04-17 DIAGNOSIS — Z9889 Other specified postprocedural states: Secondary | ICD-10-CM | POA: Insufficient documentation

## 2016-04-17 DIAGNOSIS — F321 Major depressive disorder, single episode, moderate: Secondary | ICD-10-CM | POA: Insufficient documentation

## 2016-04-17 DIAGNOSIS — Z79899 Other long term (current) drug therapy: Secondary | ICD-10-CM | POA: Insufficient documentation

## 2016-04-17 DIAGNOSIS — G44209 Tension-type headache, unspecified, not intractable: Secondary | ICD-10-CM | POA: Insufficient documentation

## 2016-04-17 DIAGNOSIS — I82503 Chronic embolism and thrombosis of unspecified deep veins of lower extremity, bilateral: Secondary | ICD-10-CM

## 2016-04-17 NOTE — Patient Instructions (Signed)
Hypertension Hypertension, commonly called high blood pressure, is when the force of blood pumping through your arteries is too strong. Your arteries are the blood vessels that carry blood from your heart throughout your body. A blood pressure reading consists of a higher number over a lower number, such as 110/72. The higher number (systolic) is the pressure inside your arteries when your heart pumps. The lower number (diastolic) is the pressure inside your arteries when your heart relaxes. Ideally you want your blood pressure below 120/80. Hypertension forces your heart to work harder to pump blood. Your arteries may become narrow or stiff. Having untreated or uncontrolled hypertension can cause heart attack, stroke, kidney disease, and other problems. RISK FACTORS Some risk factors for high blood pressure are controllable. Others are not.  Risk factors you cannot control include:   Race. You may be at higher risk if you are African American.  Age. Risk increases with age.  Gender. Men are at higher risk than women before age 45 years. After age 65, women are at higher risk than men. Risk factors you can control include:  Not getting enough exercise or physical activity.  Being overweight.  Getting too much fat, sugar, calories, or salt in your diet.  Drinking too much alcohol. SIGNS AND SYMPTOMS Hypertension does not usually cause signs or symptoms. Extremely high blood pressure (hypertensive crisis) may cause headache, anxiety, shortness of breath, and nosebleed. DIAGNOSIS To check if you have hypertension, your health care provider will measure your blood pressure while you are seated, with your arm held at the level of your heart. It should be measured at least twice using the same arm. Certain conditions can cause a difference in blood pressure between your right and left arms. A blood pressure reading that is higher than normal on one occasion does not mean that you need treatment. If  it is not clear whether you have high blood pressure, you may be asked to return on a different day to have your blood pressure checked again. Or, you may be asked to monitor your blood pressure at home for 1 or more weeks. TREATMENT Treating high blood pressure includes making lifestyle changes and possibly taking medicine. Living a healthy lifestyle can help lower high blood pressure. You may need to change some of your habits. Lifestyle changes may include:  Following the DASH diet. This diet is high in fruits, vegetables, and whole grains. It is low in salt, red meat, and added sugars.  Keep your sodium intake below 2,300 mg per day.  Getting at least 30-45 minutes of aerobic exercise at least 4 times per week.  Losing weight if necessary.  Not smoking.  Limiting alcoholic beverages.  Learning ways to reduce stress. Your health care provider may prescribe medicine if lifestyle changes are not enough to get your blood pressure under control, and if one of the following is true:  You are 18-59 years of age and your systolic blood pressure is above 140.  You are 60 years of age or older, and your systolic blood pressure is above 150.  Your diastolic blood pressure is above 90.  You have diabetes, and your systolic blood pressure is over 140 or your diastolic blood pressure is over 90.  You have kidney disease and your blood pressure is above 140/90.  You have heart disease and your blood pressure is above 140/90. Your personal target blood pressure may vary depending on your medical conditions, your age, and other factors. HOME CARE INSTRUCTIONS    Have your blood pressure rechecked as directed by your health care provider.   Take medicines only as directed by your health care provider. Follow the directions carefully. Blood pressure medicines must be taken as prescribed. The medicine does not work as well when you skip doses. Skipping doses also puts you at risk for  problems.  Do not smoke.   Monitor your blood pressure at home as directed by your health care provider. SEEK MEDICAL CARE IF:   You think you are having a reaction to medicines taken.  You have recurrent headaches or feel dizzy.  You have swelling in your ankles.  You have trouble with your vision. SEEK IMMEDIATE MEDICAL CARE IF:  You develop a severe headache or confusion.  You have unusual weakness, numbness, or feel faint.  You have severe chest or abdominal pain.  You vomit repeatedly.  You have trouble breathing. MAKE SURE YOU:   Understand these instructions.  Will watch your condition.  Will get help right away if you are not doing well or get worse.   This information is not intended to replace advice given to you by your health care provider. Make sure you discuss any questions you have with your health care provider.   Document Released: 09/03/2005 Document Revised: 01/18/2015 Document Reviewed: 06/26/2013 Elsevier Interactive Patient Education 2016 [employer]  

## 2016-04-17 NOTE — Progress Notes (Signed)
Subjective:    Patient ID: David Dunlap, male    DOB: 01-26-1971, 45 y.o.   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  He was restarted on antihypertensives at his last office visit and his blood pressure is better controlled today. He had also complained of headaches to which have improved significantly but he does have occasional pain at the occipital region of his head which he thinks could be positional.  He was commenced on Prozac for depression as well.  He brings in a form which he needs signed to enable him to donate plasma. He has no other complaints today.  Past Medical History:  Diagnosis Date  . DVT (deep venous thrombosis) (HCC)     Past Surgical History:  Procedure Laterality Date  . LIGAMENT REPAIR     R forearm    No Known Allergies  Current Outpatient Prescriptions on File Prior to Visit  Medication Sig Dispense Refill  . albuterol (PROVENTIL HFA;VENTOLIN HFA) 108 (90 Base) MCG/ACT inhaler Inhale 2 puffs into the lungs every 4 (four) hours as needed for wheezing or shortness of breath. 1 Inhaler 0  . aspirin 325 MG tablet Take 650 mg by mouth every 6 (six) hours as needed for mild pain.    Marland Kitchen FLUoxetine (PROZAC) 20 MG tablet Take 1 tablet (20 mg total) by mouth daily. 30 tablet 3  . hydrochlorothiazide (HYDRODIURIL) 25 MG tablet Take 1 tablet (25 mg total) by mouth daily. 30 tablet 3  . lisinopril (PRINIVIL,ZESTRIL) 10 MG tablet Take 1 tablet (10 mg total) by mouth daily. 30 tablet 3  . rivaroxaban (XARELTO) 20 MG TABS tablet Take 1 tablet (20 mg total) by mouth daily with supper. 90 tablet 3  . Spacer/Aero-Holding Chambers (AEROCHAMBER PLUS WITH MASK) inhaler Use as instructed 1 each 2   No current facility-administered medications on file prior to visit.       Review of Systems  Constitutional: Negative for activity change and appetite change.  HENT: Negative for sinus pressure and sore throat.   Eyes: Negative for visual disturbance.  Respiratory: Negative for cough, chest tightness and shortness of breath.   Cardiovascular: Negative for chest pain and leg swelling.  Gastrointestinal: Negative for abdominal distention, abdominal pain, constipation and diarrhea.  Endocrine: Negative.   Genitourinary: Negative for dysuria.  Musculoskeletal: Negative for joint swelling and myalgias.  Skin: Negative for rash.  Allergic/Immunologic: Negative.   Neurological: Positive for headaches. Negative for weakness, light-headedness and numbness.  Psychiatric/Behavioral: Negative for dysphoric mood and suicidal ideas.       Objective: Vitals:   04/17/16 0904  BP: 129/84  Pulse: 82  Temp: 99 F (37.2 C)  TempSrc: Oral  SpO2: 95%  Weight: 290 lb (131.5 kg)  Height: 5\' 9"  (1.753 m)      Physical Exam Constitutional: He is oriented to person, place, and time. He appears well-developed and well-nourished.  Obese  Cardiovascular: Normal heart sounds and intact distal pulses.   No murmur heard. Pulmonary/Chest: Effort normal and breath sounds normal. He has no wheezes. He has no rales. He exhibits no tenderness.  Abdominal: Soft. Bowel sounds are normal. He exhibits no distension and no mass. There is no tenderness.  Musculoskeletal: Normal range of motion.  Neurological: He is alert and oriented to person, place, and time.  Assessment & Plan:  1. Essential hypertension Controlled Low-sodium, DASH diet Weight loss and lifestyle changes Lipid panel at next visit. - hydrochlorothiazide (HYDRODIURIL) 25 MG tablet; Take 1 tablet (25 mg total) by mouth daily.  Dispense: 30 tablet; Refill: 3 - lisinopril (PRINIVIL,ZESTRIL) 10 MG tablet; Take 1 tablet (10 mg total) by mouth daily.  Dispense: 30 tablet; Refill: 3  2. Leg DVT (deep venous  thromboembolism), chronic, bilateral (HCC) On chronic anticoagulation with Xarelto  3. Calculus of gallbladder without cholecystitis without obstruction Pain at this time I have informed him that if pain persist I will be happy to refer him to a general surgeon however he has been advised to work on [employer] discount to facilitate this referral  4. Major depressive disorder, single episode, moderate (HCC) Situational - FLUoxetine (PROZAC) 20 MG tablet; Take 1 tablet (20 mg total) by mouth daily.  Dispense: 30 tablet; Refill: 3  5. Tension-type headache, not intractable, unspecified chronicity pattern Improved Use OTC Tylenol as needed.

## 2016-04-28 ENCOUNTER — Emergency Department (HOSPITAL_COMMUNITY): Payer: Self-pay

## 2016-04-28 ENCOUNTER — Encounter (HOSPITAL_COMMUNITY): Payer: Self-pay | Admitting: *Deleted

## 2016-04-28 ENCOUNTER — Emergency Department (HOSPITAL_COMMUNITY)
Admission: EM | Admit: 2016-04-28 | Discharge: 2016-04-28 | Disposition: A | Discharge status: Home / Self Care | Payer: Self-pay | Attending: Emergency Medicine | Admitting: Emergency Medicine

## 2016-04-28 DIAGNOSIS — Z7901 Long term (current) use of anticoagulants: Secondary | ICD-10-CM | POA: Insufficient documentation

## 2016-04-28 DIAGNOSIS — R1011 Right upper quadrant pain: Secondary | ICD-10-CM

## 2016-04-28 DIAGNOSIS — K8020 Calculus of gallbladder without cholecystitis without obstruction: Secondary | ICD-10-CM | POA: Insufficient documentation

## 2016-04-28 LAB — URINALYSIS, ROUTINE W REFLEX MICROSCOPIC
Bilirubin Urine: NEGATIVE
GLUCOSE, UA: NEGATIVE mg/dL
HGB URINE DIPSTICK: NEGATIVE
KETONES UR: NEGATIVE mg/dL
LEUKOCYTES UA: NEGATIVE
Nitrite: NEGATIVE
PROTEIN: NEGATIVE mg/dL
Specific Gravity, Urine: 1.029 (ref 1.005–1.030)
pH: 5 (ref 5.0–8.0)

## 2016-04-28 LAB — HEPATIC FUNCTION PANEL
ALT: 41 U/L (ref 17–63)
AST: 17 U/L (ref 15–41)
Albumin: 4.2 g/dL (ref 3.5–5.0)
Alkaline Phosphatase: 60 U/L (ref 38–126)
Total Bilirubin: 1.1 mg/dL (ref 0.3–1.2)
Total Protein: 7.3 g/dL (ref 6.5–8.1)

## 2016-04-28 LAB — BASIC METABOLIC PANEL
ANION GAP: 9 (ref 5–15)
BUN: 20 mg/dL (ref 6–20)
CALCIUM: 8.9 mg/dL (ref 8.9–10.3)
CO2: 24 mmol/L (ref 22–32)
CREATININE: 1.24 mg/dL (ref 0.61–1.24)
Chloride: 97 mmol/L — ABNORMAL LOW (ref 101–111)
Glucose, Bld: 119 mg/dL — ABNORMAL HIGH (ref 65–99)
Potassium: 4 mmol/L (ref 3.5–5.1)
SODIUM: 130 mmol/L — AB (ref 135–145)

## 2016-04-28 LAB — I-STAT TROPONIN, ED: TROPONIN I, POC: 0 ng/mL (ref 0.00–0.08)

## 2016-04-28 LAB — CBC
HCT: 45.4 % (ref 39.0–52.0)
Hemoglobin: 16.3 g/dL (ref 13.0–17.0)
MCH: 29.4 pg (ref 26.0–34.0)
MCHC: 35.9 g/dL (ref 30.0–36.0)
MCV: 81.8 fL (ref 78.0–100.0)
PLATELETS: 192 10*3/uL (ref 150–400)
RBC: 5.55 MIL/uL (ref 4.22–5.81)
RDW: 12.8 % (ref 11.5–15.5)
WBC: 13.9 10*3/uL — AB (ref 4.0–10.5)

## 2016-04-28 LAB — LIPASE, BLOOD: Lipase: 23 U/L (ref 11–51)

## 2016-04-28 MED ORDER — SODIUM CHLORIDE 0.9 % IV BOLUS (SEPSIS)
1000.0000 mL | Freq: Once | INTRAVENOUS | Status: AC
  Administered 2016-04-28: 1000 mL via INTRAVENOUS

## 2016-04-28 MED ORDER — OXYCODONE-ACETAMINOPHEN 5-325 MG PO TABS: 1.0000 | ORAL_TABLET | Freq: Three times a day (TID) | ORAL | 0 refills | Status: DC | PRN

## 2016-04-28 MED ORDER — MORPHINE SULFATE (PF) 4 MG/ML IV SOLN
4.0000 mg | Freq: Once | INTRAVENOUS | Status: AC
  Administered 2016-04-28: 4 mg via INTRAVENOUS
  Filled 2016-04-28: qty 1

## 2016-04-28 NOTE — ED Notes (Signed)
Patient returned from u/s 

## 2016-04-28 NOTE — Discharge Instructions (Signed)
Please read and follow all provided instructions.  Your diagnoses today include:  1. Calculus of gallbladder without cholecystitis without obstruction   2. RUQ pain    Tests performed today include: Vital signs. See below for your results today.   Medications prescribed:  Take as prescribed   Home care instructions:  Follow any educational materials contained in this packet.  Follow-up instructions: Please follow-up with General Surgery for further evaluation of symptoms and treatment. Call and schedule an outpatient appointment (see above)   Return instructions:  Please return to the Emergency Department if you do not get better, if you get worse, or new symptoms OR  - Fever (temperature greater than 101.92F)  - Bleeding that does not stop with holding pressure to the area    -Severe pain (please note that you may be more sore the day after your accident)  - Chest Pain  - Difficulty breathing  - Severe nausea or vomiting  - Inability to tolerate food and liquids  - Passing out  - Skin becoming red around your wounds  - Change in mental status (confusion or lethargy)  - New numbness or weakness    Please return if you have any other emergent concerns.  Additional Information:  Your vital signs today were: BP 113/78    Pulse 93    Temp 98.6 F (37 C) (Oral)    Resp 19    Ht  (1.753 m)    Wt 129.1 kg    SpO2 95%    BMI 42.03 kg/m  If your blood pressure (BP) was elevated above 135/85 this visit, please have this repeated by your doctor within one month. ---------------

## 2016-04-28 NOTE — ED Triage Notes (Signed)
Pt reports right side abdominal pain and central non radiating chest pain that started this morning while working. Pt reports vomiting x 3. Pt did not take asa for chest pain.

## 2016-04-28 NOTE — ED Provider Notes (Signed)
[facility]-EMERGENCY DEPT Provider Note   CSN: [id number] Arrival date & time: 04/28/16  1500  First Provider Contact:  First MD Initiated Contact with Patient 04/28/16 1643        History   Chief Complaint Chief Complaint  Patient presents with  . Chest Pain  . Abdominal Pain    HPI David Dunlap is a 45 y.o. male.  HPI  45 y.o. male with a hx of DVT on Xarelto, presents to the Emergency Department today complaining of RUQ abdominal pain with onset last night. Does not endorse chest pain. States abdominal pain worsened this AM with associated N/V x 4-5. Tried eating at work with little success. States pain is 9/10 and comes in waves at every 5-10 minutes. Notes duration around 13 seconds. Denies hematochezia. No fevers. No other symptoms noted. Pt is on Xarelto for previous DVT. No hx PE.   Past Medical History:  Diagnosis Date  . DVT (deep venous thrombosis) [facility])     Patient Active Problem List   Diagnosis Date Noted  . Cholelithiasis 04/09/2016  . Major depressive disorder, single episode 04/09/2016  . Leg DVT (deep venous thromboembolism), chronic (HCC) 12/08/2014  . Family history of diabetes mellitus (DM) 12/08/2014  . DVT, femoral, acute (HCC) 03/22/2014  . Moderate major depression, single episode (HCC) 08/22/2011  . Constipation 11/22/2010  . ANKLE EDEMA 10/19/2010  . Essential hypertension 10/12/2010    Past Surgical History:  Procedure Laterality Date  . LIGAMENT REPAIR     R forearm     Home Medications    Prior to Admission medications   Medication Sig Start Date End Date Taking? Authorizing Provider  albuterol (PROVENTIL HFA;VENTOLIN HFA) 108 (90 Base) MCG/ACT inhaler Inhale 2 puffs into the lungs every 4 (four) hours as needed for wheezing or shortness of breath. 10/11/15  Yes Arby Barrette, MD  FLUoxetine (PROZAC) 20 MG tablet Take 1 tablet (20 mg total) by mouth daily. 04/09/16  Yes Jaclyn Shaggy, MD  hydrochlorothiazide (HYDRODIURIL) 25 MG tablet  Take 1 tablet (25 mg total) by mouth daily. 04/09/16  Yes Jaclyn Shaggy, MD  lisinopril (PRINIVIL,ZESTRIL) 10 MG tablet Take 1 tablet (10 mg total) by mouth daily. 04/09/16  Yes Jaclyn Shaggy, MD  rivaroxaban (XARELTO) 20 MG TABS tablet Take 1 tablet (20 mg total) by mouth daily with supper. 12/27/14  Yes Doris Cheadle, MD  Spacer/Aero-Holding Chambers (AEROCHAMBER PLUS WITH MASK) inhaler Use as instructed 10/11/15  Yes Arby Barrette, MD    Family History Family History  Problem Relation Age of Onset  . Diabetes Mother   . Diabetes Father   . Hypertension Sister     Social History Social History  Substance Use Topics  . Smoking status: Never Smoker  . Smokeless tobacco: Never Used  . Alcohol use 1.2 oz/week    2 Cans of beer per week     Allergies   Review of patient's allergies indicates no known allergies.   Review of Systems Review of Systems ROS reviewed and all are negative for acute change except as noted in the HPI.    Physical Exam Updated Vital Signs BP 116/71 (BP Location: Right Arm)   Pulse 95   Temp 98.6 F (37 C) (Oral)   Resp 20   Ht 5\' 9"  (1.753 m)   Wt 129.1 kg   SpO2 98%   BMI 42.03 kg/m   Physical Exam  Constitutional: He is oriented to person, place, and time. Vital signs are normal. He appears well-developed  and well-nourished.  HENT:  Head: Normocephalic.  Right Ear: Hearing normal.  Left Ear: Hearing normal.  Eyes: Conjunctivae and EOM are normal. Pupils are equal, round, and reactive to light.  Neck: Normal range of motion. Neck supple.  Cardiovascular: Normal rate, regular rhythm, normal heart sounds and intact distal pulses.   Pulmonary/Chest: Effort normal and breath sounds normal.  Abdominal: Soft. Normal appearance. There is tenderness in the right upper quadrant. There is positive Murphy's sign. There is no rigidity, no rebound, no guarding, no CVA tenderness and no tenderness at McBurney's point.  Neurological: He is alert and  oriented to person, place, and time.  Skin: Skin is warm and dry.  Psychiatric: He has a normal mood and affect. His speech is normal and behavior is normal. Thought content normal.   ED Treatments / Results  Labs (all labs ordered are listed, but only abnormal results are displayed) Labs Reviewed  BASIC METABOLIC PANEL - Abnormal; Notable for the following:       Result Value   Sodium 130 (*)    Chloride 97 (*)    Glucose, Bld 119 (*)    All other components within normal limits  CBC - Abnormal; Notable for the following:    WBC 13.9 (*)    All other components within normal limits  HEPATIC FUNCTION PANEL - Abnormal; Notable for the following:    Bilirubin, Direct <0.1 (*)    All other components within normal limits  LIPASE, BLOOD  URINALYSIS, ROUTINE W REFLEX MICROSCOPIC (NOT AT [facility])  Rosezena SensorI-STAT TROPOININ, ED    EKG  EKG Interpretation None       Radiology Dg Chest 2 View  Result Date: 04/28/2016 CLINICAL DATA:  Mid sternal chest pain with nausea and vomiting beginning this morning, history hypertension EXAM: CHEST  2 VIEW COMPARISON:  10/11/2015 FINDINGS: Upper-normal size of cardiac silhouette. Mediastinal contours and pulmonary vascularity normal. Lungs clear. No pleural effusion or pneumothorax. Bones unremarkable. IMPRESSION: No acute abnormalities. Electronically Signed   By: Ulyses SouthwardMark  Boles M.D.   On: 04/28/2016 15:55   [place] Abdomen Limited Ruq  Result Date: 04/28/2016 CLINICAL DATA:  Right upper quadrant pain EXAM: US ABDOMEN LIMITED - RIGHT UPPER QUADRANT COMPARISON:  03/30/2016 FINDINGS: Gallbladder: Well distended with evidence of cholelithiasis. No wall thickening or pericholecystic fluid is noted. Common bile duct: Diameter: 4 mm. Liver: Mildly increased consistent with fatty infiltration. IMPRESSION: No acute abnormality is noted. No significant interval change from the prior exam is seen. Electronically Signed   By: Alcide CleverMark  Lukens M.D.   On: 04/28/2016 17:41     Procedures Procedures (including critical care time)  Medications Ordered in ED Medications  sodium chloride 0.9 % bolus 1,000 mL (1,000 mLs Intravenous New Bag/Given 04/28/16 1817)  morphine 4 MG/ML injection 4 mg (4 mg Intravenous Given 04/28/16 1817)  morphine 4 MG/ML injection 4 mg (4 mg Intravenous Given 04/28/16 1929)   Initial Impression / Assessment and Plan / ED Course  I have reviewed the triage vital signs and the nursing notes.  Pertinent labs & imaging results that were available during my care of the patient were reviewed by me and considered in my medical decision making (see chart for details).  Clinical Course    Final Clinical Impressions(s) / ED Diagnoses  I have reviewed and evaluated the relevant laboratory valuesI have reviewed and evaluated the relevant imaging studies. I have interpreted the relevant EKG.I have reviewed the relevant previous healthcare records.I obtained HPI from historian. Patient discussed  with supervising physician  ED Course:  Assessment: Pt is a 44yM with hx DVT on Xarelto who presents with RUQ pain since last night. Worsened this AM with N/V x5. Hx gallstones. Decrease PO intake at worl. Pain colicky. On exam, pt in NAD. Nontoxic/nonseptic appearing. VSS. Afebrile. Lungs CTA. Heart RRR. Abdomen TTP RUQ with Positive Murphys. Labs unremarkable. Trop neg. CXR unremarkable. [place] RUQ with cholelithiasis without cholecystitis. Given fluids and anagelsia in ED. Pt seen and evaluated by supervising physician. Plan is to DC home with pain medication and referral to general surgery for outpatient evaluation. Pain controlled in ED with tolerable PO intake. At time of discharge, Patient is in no acute distress. Vital Signs are stable. Patient is able to ambulate. Patient able to tolerate PO.    Disposition/Plan:  DC Home Additional Verbal discharge instructions given and discussed with patient.  Pt Instructed to f/u with General Surgery in the next  week for evaluation and treatment of symptoms. Return precautions given Pt acknowledges and agrees with plan  Supervising Physician Doug Sou, MD   Final diagnoses:  RUQ pain  Calculus of gallbladder without cholecystitis without obstruction    New Prescriptions New Prescriptions   No medications on file     Audry Pili, PA-C 04/28/16 1948    Doug Sou, MD 04/28/16 2259

## 2016-05-18 DIAGNOSIS — E119 Type 2 diabetes mellitus without complications: Secondary | ICD-10-CM | POA: Insufficient documentation

## 2016-05-18 DIAGNOSIS — K8020 Calculus of gallbladder without cholecystitis without obstruction: Secondary | ICD-10-CM

## 2016-05-18 HISTORY — DX: Type 2 diabetes mellitus without complications: E11.9

## 2016-05-18 HISTORY — DX: Calculus of gallbladder without cholecystitis without obstruction: K80.20

## 2016-05-29 ENCOUNTER — Emergency Department (HOSPITAL_COMMUNITY)
Admission: EM | Admit: 2016-05-29 | Discharge: 2016-05-29 | Disposition: A | Discharge status: Home / Self Care | Payer: Self-pay | Attending: Emergency Medicine | Admitting: Emergency Medicine

## 2016-05-29 ENCOUNTER — Emergency Department (HOSPITAL_COMMUNITY): Payer: Self-pay

## 2016-05-29 ENCOUNTER — Encounter (HOSPITAL_COMMUNITY): Payer: Self-pay | Admitting: Emergency Medicine

## 2016-05-29 DIAGNOSIS — I10 Essential (primary) hypertension: Secondary | ICD-10-CM | POA: Insufficient documentation

## 2016-05-29 DIAGNOSIS — F1290 Cannabis use, unspecified, uncomplicated: Secondary | ICD-10-CM | POA: Insufficient documentation

## 2016-05-29 DIAGNOSIS — R1011 Right upper quadrant pain: Secondary | ICD-10-CM

## 2016-05-29 DIAGNOSIS — K8020 Calculus of gallbladder without cholecystitis without obstruction: Secondary | ICD-10-CM | POA: Insufficient documentation

## 2016-05-29 DIAGNOSIS — Z79899 Other long term (current) drug therapy: Secondary | ICD-10-CM | POA: Insufficient documentation

## 2016-05-29 HISTORY — DX: Essential (primary) hypertension: I10

## 2016-05-29 LAB — URINALYSIS, ROUTINE W REFLEX MICROSCOPIC
BILIRUBIN URINE: NEGATIVE
GLUCOSE, UA: 100 mg/dL — AB
Hgb urine dipstick: NEGATIVE
KETONES UR: NEGATIVE mg/dL
LEUKOCYTES UA: NEGATIVE
Nitrite: NEGATIVE
PH: 6 (ref 5.0–8.0)
PROTEIN: NEGATIVE mg/dL
Specific Gravity, Urine: 1.013 (ref 1.005–1.030)

## 2016-05-29 LAB — LIPASE, BLOOD: Lipase: 26 U/L (ref 11–51)

## 2016-05-29 LAB — CBC
HEMATOCRIT: 44.1 % (ref 39.0–52.0)
Hemoglobin: 15.9 g/dL (ref 13.0–17.0)
MCH: 29.5 pg (ref 26.0–34.0)
MCHC: 36.1 g/dL — AB (ref 30.0–36.0)
MCV: 81.8 fL (ref 78.0–100.0)
PLATELETS: 186 10*3/uL (ref 150–400)
RBC: 5.39 MIL/uL (ref 4.22–5.81)
RDW: 14 % (ref 11.5–15.5)
WBC: 13.8 10*3/uL — AB (ref 4.0–10.5)

## 2016-05-29 LAB — COMPREHENSIVE METABOLIC PANEL
ALBUMIN: 4.2 g/dL (ref 3.5–5.0)
ALT: 41 U/L (ref 17–63)
AST: 16 U/L (ref 15–41)
Alkaline Phosphatase: 60 U/L (ref 38–126)
Anion gap: 8 (ref 5–15)
BILIRUBIN TOTAL: 0.7 mg/dL (ref 0.3–1.2)
BUN: 15 mg/dL (ref 6–20)
CHLORIDE: 102 mmol/L (ref 101–111)
CO2: 24 mmol/L (ref 22–32)
CREATININE: 1.15 mg/dL (ref 0.61–1.24)
Calcium: 9.4 mg/dL (ref 8.9–10.3)
GFR calc Af Amer: >60 mL/min (ref >60)
GLUCOSE: 172 mg/dL — AB (ref 65–99)
POTASSIUM: 4.3 mmol/L (ref 3.5–5.1)
Sodium: 134 mmol/L — ABNORMAL LOW (ref 135–145)
TOTAL PROTEIN: 7.7 g/dL (ref 6.5–8.1)

## 2016-05-29 LAB — I-STAT TROPONIN, ED: Troponin i, poc: 0 ng/mL (ref 0.00–0.08)

## 2016-05-29 MED ORDER — ONDANSETRON HCL 4 MG/2ML IJ SOLN
4.0000 mg | Freq: Once | INTRAMUSCULAR | Status: AC
  Administered 2016-05-29: 4 mg via INTRAVENOUS
  Filled 2016-05-29: qty 2

## 2016-05-29 MED ORDER — OXYCODONE-ACETAMINOPHEN 5-325 MG PO TABS: 1.0000 | ORAL_TABLET | Freq: Four times a day (QID) | ORAL | 0 refills | Status: DC | PRN

## 2016-05-29 MED ORDER — ONDANSETRON HCL 4 MG PO TABS: 4.0000 mg | ORAL_TABLET | Freq: Four times a day (QID) | ORAL | 0 refills | Status: DC

## 2016-05-29 MED ORDER — MORPHINE SULFATE (PF) 4 MG/ML IV SOLN
4.0000 mg | Freq: Once | INTRAVENOUS | Status: AC
  Administered 2016-05-29: 4 mg via INTRAVENOUS
  Filled 2016-05-29: qty 1

## 2016-05-29 NOTE — ED Notes (Signed)
US at bedside

## 2016-05-29 NOTE — Progress Notes (Signed)
Pt seen by [facility] staff, Stacy Pt with orange card per Stacy  [facility] x [facility]  Pt with DR Venetia NightAmao [facility] Noted last OV was 04/17/16

## 2016-05-29 NOTE — ED Notes (Signed)
Pt received sandwich tray, tolerated food well.

## 2016-05-29 NOTE — ED Triage Notes (Signed)
Pt reports pain and tenderness in RUQ and right lower back starting three days ago.  Denies N/V.  Denies any changes in urination or constipation.  Reports diarrhea.  Reports no history of painful gallstones or gallbladder issues.

## 2016-05-29 NOTE — ED Provider Notes (Signed)
[facility]-EMERGENCY DEPT Provider Note   CSN: [id number] Arrival date & time: 05/29/16  0954     History   Chief Complaint Chief Complaint  Patient presents with  . Abdominal Pain    HPI David Dunlap is a 45 y.o. male.  Patient with a history of Gallstones presents today with a chief complaint of RUQ abdominal pain.  He reports that the pain has been constant since last evening.  Pain radiates to his back.  Pain similar to the pain that he has had with Gallstones in the past.  He reports associated nausea, but denies vomiting.  He denies chest pain or SOB.  Denies cough or fever.  He has taken Norco for the pain, but does not feel that it helps.  He reports that he has been referred to Surgery for the Gallstones in the past, but has not yet followed up because he was told that he would need money up front.        Past Medical History:  Diagnosis Date  . DVT (deep venous thrombosis) (HCC)   . Essential hypertension     Patient Active Problem List   Diagnosis Date Noted  . Cholelithiasis 04/09/2016  . Major depressive disorder, single episode 04/09/2016  . Leg DVT (deep venous thromboembolism), chronic (HCC) 12/08/2014  . Family history of diabetes mellitus (DM) 12/08/2014  . DVT, femoral, acute (HCC) 03/22/2014  . Moderate major depression, single episode (HCC) 08/22/2011  . Constipation 11/22/2010  . ANKLE EDEMA 10/19/2010  . Essential hypertension 10/12/2010    Past Surgical History:  Procedure Laterality Date  . LIGAMENT REPAIR     R forearm       Home Medications    Prior to Admission medications   Medication Sig Start Date End Date Taking? Authorizing Provider  albuterol (PROVENTIL HFA;VENTOLIN HFA) 108 (90 Base) MCG/ACT inhaler Inhale 2 puffs into the lungs every 4 (four) hours as needed for wheezing or shortness of breath. 10/11/15  Yes Arby BarretteMarcy Pfeiffer, MD  famotidine (PEPCID) 20 MG tablet Take 20 mg by mouth daily as needed for heartburn.  05/22/16  Yes  Historical Provider, MD  FLUoxetine (PROZAC) 20 MG tablet Take 1 tablet (20 mg total) by mouth daily. 04/09/16  Yes Jaclyn ShaggyEnobong Amao, MD  hydrochlorothiazide (HYDRODIURIL) 25 MG tablet Take 1 tablet (25 mg total) by mouth daily. 04/09/16  Yes Jaclyn ShaggyEnobong Amao, MD  lisinopril (PRINIVIL,ZESTRIL) 10 MG tablet Take 1 tablet (10 mg total) by mouth daily. 04/09/16  Yes Jaclyn ShaggyEnobong Amao, MD  rivaroxaban (XARELTO) 20 MG TABS tablet Take 1 tablet (20 mg total) by mouth daily with supper. 12/27/14  Yes Doris Cheadleeepak Advani, MD  oxyCODONE-acetaminophen (PERCOCET/ROXICET) 5-325 MG tablet Take 1 tablet by mouth every 8 (eight) hours as needed for severe pain. Patient not taking: Reported on 05/29/2016 04/28/16   Audry Piliyler Mohr, PA-C  Spacer/Aero-Holding Chambers (AEROCHAMBER PLUS WITH MASK) inhaler Use as instructed 10/11/15   Arby BarretteMarcy Pfeiffer, MD    Family History Family History  Problem Relation Age of Onset  . Diabetes Mother   . Diabetes Father   . Hypertension Sister     Social History Social History  Substance Use Topics  . Smoking status: Never Smoker  . Smokeless tobacco: Never Used  . Alcohol use No     Allergies   Review of patient's allergies indicates no known allergies.   Review of Systems Review of Systems  All other systems reviewed and are negative.    Physical Exam Updated Vital Signs BP 137/98 (  BP Location: Right Arm)   Pulse 91   Temp 98.4 F (36.9 C) (Oral)   Resp 16   Ht 5\' 9"  (1.753 m)   Wt 122.5 kg   SpO2 94%   BMI 39.87 kg/m   Physical Exam  Constitutional: He appears well-developed and well-nourished.  HENT:  Head: Normocephalic and atraumatic.  Mouth/Throat: Oropharynx is clear and moist.  Neck: Normal range of motion. Neck supple.  Cardiovascular: Normal rate, regular rhythm and normal heart sounds.   Pulmonary/Chest: Effort normal and breath sounds normal.  Abdominal: Soft. Bowel sounds are normal. He exhibits no distension and no mass. There is tenderness in the right upper  quadrant. There is positive Murphy's sign. There is no rebound and no guarding. No hernia.  Neurological: He is alert.  Skin: Skin is warm and dry.  Psychiatric: He has a normal mood and affect.  Nursing note and vitals reviewed.    ED Treatments / Results  Labs (all labs ordered are listed, but only abnormal results are displayed) Labs Reviewed  CBC - Abnormal; Notable for the following:       Result Value   WBC 13.8 (*)    MCHC 36.1 (*)    All other components within normal limits  LIPASE, BLOOD  COMPREHENSIVE METABOLIC PANEL  URINALYSIS, ROUTINE W REFLEX MICROSCOPIC (NOT AT [facility])    EKG  EKG Interpretation None       Radiology No results found.  Procedures Procedures (including critical care time)  Medications Ordered in ED Medications  morphine 4 MG/ML injection 4 mg (4 mg Intravenous Given 05/29/16 1130)  ondansetron (ZOFRAN) injection 4 mg (4 mg Intravenous Given 05/29/16 1130)     Initial Impression / Assessment and Plan / ED Course  I have reviewed the triage vital signs and the nursing notes.  Pertinent labs & imaging results that were available during my care of the patient were reviewed by me and considered in my medical decision making (see chart for details).  Clinical Course    Final Clinical Impressions(s) / ED Diagnoses   Final diagnoses:  RUQ pain   Patient with a history of Gallstones presents today with RUQ abdominal pain and nausea.   He reports onset of symptoms last evening.  On exam, he does have localized tenderness to the RUQ.  Abdominal ultrasound showing Gallstones, but no evidence of Cholecystitis.  Labs unremarkable aside from mild leukocytosis.  Pain and nausea improved in the ED.  Patient eating a sandwich prior to discharge.  Stable for discharge.  Return precautions given.   New Prescriptions New Prescriptions   No medications on file     Santiago Glad, PA-C 06/01/16 1409    Pricilla Loveless, MD 06/02/16 [phone]

## 2016-05-30 MED FILL — HYDROCHLOROTHIAZIDE 25 MG T: 25 | 30 days supply | Qty: 30 | Fill #1

## 2016-05-30 MED FILL — ?FLUOXETINE HCL 20MG TABLET: 20 | 30 days supply | Qty: 30 | Fill #1

## 2016-05-30 MED FILL — ?LISINOPRIL 10 MG TABLET: 10 | 30 days supply | Qty: 30 | Fill #1

## 2016-06-01 ENCOUNTER — Ambulatory Visit: Payer: No Typology Code available for payment source | Attending: Internal Medicine | Admitting: Physician Assistant

## 2016-06-01 ENCOUNTER — Encounter: Payer: Self-pay | Admitting: Physician Assistant

## 2016-06-01 VITALS — BP 144/99 | HR 94 | Temp 98.0°F | Resp 16 | Wt 285.8 lb

## 2016-06-01 DIAGNOSIS — R739 Hyperglycemia, unspecified: Secondary | ICD-10-CM

## 2016-06-01 DIAGNOSIS — I10 Essential (primary) hypertension: Secondary | ICD-10-CM

## 2016-06-01 DIAGNOSIS — K8018 Calculus of gallbladder with other cholecystitis without obstruction: Secondary | ICD-10-CM

## 2016-06-01 LAB — POCT GLYCOSYLATED HEMOGLOBIN (HGB A1C): HEMOGLOBIN A1C: 6.6

## 2016-06-01 MED ORDER — METFORMIN HCL 500 MG PO TABS: 500.0000 mg | ORAL_TABLET | Freq: Two times a day (BID) | ORAL | 1 refills | Status: DC

## 2016-06-01 MED ORDER — HYDROCHLOROTHIAZIDE 25 MG PO TABS: 25.0000 mg | ORAL_TABLET | Freq: Every day | ORAL | 3 refills | Status: DC

## 2016-06-01 MED ORDER — LISINOPRIL 10 MG PO TABS: 10.0000 mg | ORAL_TABLET | Freq: Every day | ORAL | 3 refills | Status: DC

## 2016-06-01 MED FILL — ?METFORMIN HCL 500MG TABLET: 500 | 26 days supply | Qty: 52 | Fill #0

## 2016-06-01 NOTE — Progress Notes (Signed)
Pt is in the office today for a ED follow up Pt states his pain level today in the office is a 8 Pt states his pain is coming from his galbladder  285.8

## 2016-06-01 NOTE — Patient Instructions (Addendum)
Keep appointment with surgeon  Cholelithiasis Cholelithiasis (also called gallstones) is a form of gallbladder disease in which gallstones form in your gallbladder. The gallbladder is an organ that stores bile made in the liver, which helps digest fats. Gallstones begin as small crystals and slowly grow into stones. Gallstone pain occurs when the gallbladder spasms and a gallstone is blocking the duct. Pain can also occur when a stone passes out of the duct.  RISK FACTORS  Being male.   Having multiple pregnancies. Health care providers sometimes advise removing diseased gallbladders before future pregnancies.   Being obese.  Eating a diet heavy in fried foods and fat.   Being older than 60 years and increasing age.   Prolonged use of medicines containing male hormones.   Having diabetes mellitus.   Rapidly losing weight.   Having a family history of gallstones (heredity).  SYMPTOMS  Nausea.   Vomiting.  Abdominal pain.   Yellowing of the skin (jaundice).   Sudden pain. It may persist from several minutes to several hours.  Fever.   Tenderness to the touch. In some cases, when gallstones do not move into the bile duct, people have no pain or symptoms. These are called "silent" gallstones.  TREATMENT Silent gallstones do not need treatment. In severe cases, emergency surgery may be required. Options for treatment include:  Surgery to remove the gallbladder. This is the most common treatment.  Medicines. These do not always work and may take 6-12 months or more to work.  Shock wave treatment (extracorporeal biliary lithotripsy). In this treatment an ultrasound machine sends shock waves to the gallbladder to break gallstones into smaller pieces that can pass into the intestines or be dissolved by medicine. HOME CARE INSTRUCTIONS   Only take over-the-counter or prescription medicines for pain, discomfort, or fever as directed by your health care provider.    Follow a low-fat diet until seen again by your health care provider. Fat causes the gallbladder to contract, which can result in pain.   Follow up with your health care provider as directed. Attacks are almost always recurrent and surgery is usually required for permanent treatment.  SEEK IMMEDIATE MEDICAL CARE IF:   Your pain increases and is not controlled by medicines.   You have a fever or persistent symptoms for more than 2-3 days.   You have a fever and your symptoms suddenly get worse.   You have persistent nausea and vomiting.  MAKE SURE YOU:   Understand these instructions.  Will watch your condition.  Will get help right away if you are not doing well or get worse.   This information is not intended to replace advice given to you by your health care provider. Make sure you discuss any questions you have with your health care provider.   Document Released: 08/30/2005 Document Revised: 05/06/2013 Document Reviewed: 02/25/2013 Elsevier Interactive Patient Education 2016 Elsevier Inc.   Basic Carbohydrate Counting for Diabetes Mellitus Carbohydrate counting is a method for keeping track of the amount of carbohydrates you eat. Eating carbohydrates naturally increases the level of sugar (glucose) in your blood, so it is important for you to know the amount that is okay for you to have in every meal. Carbohydrate counting helps keep the level of glucose in your blood within normal limits. The amount of carbohydrates allowed is different for every person. A dietitian can help you calculate the amount that is right for you. Once you know the amount of carbohydrates you can have, you can  count the carbohydrates in the foods you want to eat. Carbohydrates are found in the following foods: Grains, such as breads and cereals. Dried beans and soy products. Starchy vegetables, such as potatoes, peas, and corn. Fruit and fruit juices. Milk and yogurt. Sweets and snack foods,  such as cake, cookies, candy, chips, soft drinks, and fruit drinks. CARBOHYDRATE COUNTING There are two ways to count the carbohydrates in your food. You can use either of the methods or a combination of both. Reading the "Nutrition Facts" on Packaged Food The "Nutrition Facts" is an area that is included on the labels of almost all packaged food and beverages in the [place]. It includes the serving size of that food or beverage and information about the nutrients in each serving of the food, including the grams (g) of carbohydrate per serving.  Decide the number of servings of this food or beverage that you will be able to eat or drink. Multiply that number of servings by the number of grams of carbohydrate that is listed on the label for that serving. The total will be the amount of carbohydrates you will be having when you eat or drink this food or beverage. Learning Standard Serving Sizes of Food When you eat food that is not packaged or does not include "Nutrition Facts" on the label, you need to measure the servings in order to count the amount of carbohydrates.A serving of most carbohydrate-rich foods contains about 15 g of carbohydrates. The following list includes serving sizes of carbohydrate-rich foods that provide 15 g ofcarbohydrate per serving:  1 slice of bread (1 oz) or 1 six-inch tortilla.   of a hamburger bun or English muffin. 4-6 crackers.  cup unsweetened dry cereal.   cup hot cereal.  cup rice or pasta.   cup mashed potatoes or  of a large baked potato. 1 cup fresh fruit or one small piece of fruit.   cup canned or frozen fruit or fruit juice. 1 cup milk.  cup plain fat-free yogurt or yogurt sweetened with artificial sweeteners.  cup cooked dried beans or starchy vegetable, such as peas, corn, or potatoes.  Decide the number of standard-size servings that you will eat. Multiply that number of servings by 15 (the grams of carbohydrates in that serving).  For example, if you eat 2 cups of strawberries, you will have eaten 2 servings and 30 g of carbohydrates (2 servings x 15 g = 30 g). For foods such as soups and casseroles, in which more than one food is mixed in, you will need to count the carbohydrates in each food that is included. EXAMPLE OF CARBOHYDRATE COUNTING Sample Dinner 3 oz chicken breast.  cup of brown rice.  cup of corn. 1 cup milk.  1 cup strawberries with sugar-free whipped topping.  Carbohydrate Calculation Step 1: Identify the foods that contain carbohydrates:  Rice.  Corn.  Milk.  Strawberries. Step 2:Calculate the number of servings eaten of each:  2 servings of rice.  1 serving of corn.  1 serving of milk.  1 serving of strawberries. Step 3: Multiply each of those number of servings by 15 g:  2 servings of rice x 15 g = 30 g.  1 serving of corn x 15 g = 15 g.  1 serving of milk x 15 g = 15 g.  1 serving of strawberries x 15 g = 15 g. Step 4: Add together all of the amounts to find the total grams of carbohydrates eaten: 30  g + 15 g + 15 g + 15 g = 75 g.   This information is not intended to replace advice given to you by your health care provider. Make sure you discuss any questions you have with your health care provider.   Document Released: 09/03/2005 Document Revised: 09/24/2014 Document Reviewed: 07/31/2013 Elsevier Interactive Patient Education [employer]2016 Elsevier Inc.

## 2016-06-01 NOTE — Progress Notes (Signed)
David CouncilmanKeith Cosman, is a 45 y.o. male  [id number]  [id number]  DOB - 01/12/1971  Subjective:  Chief Complaint and HPI: David Dunlap is a 45 y.o. male here today for ED follow-up after being in the ED for abdominal pain and diagnosed with gallstones and fatty liver.  His pain is much improved.  He has a consult scheduled with a general surgeon later this month (?25th).  He is out of his BP meds and hasn't taken them in more than a few days. He denies any new complaints.  No N/V/D.    ED/Hospital notes reviewed.     ROS:   Constitutional:  No f/c, No night sweats, No unexplained weight loss. EENT:  No vision changes, No blurry vision, No hearing changes. No mouth, throat, or ear problems.  Respiratory: No cough, No SOB Cardiac: No CP, no palpitations GI:  + mild RUQ abd pain, No N/V/D. GU: No Urinary s/sx Musculoskeletal: No joint pain Neuro: No headache, no dizziness, no motor weakness.  Skin: No rash Endocrine:  No polydipsia. No polyuria.  Psych: Denies SI/HI  No problems updated.  ALLERGIES: No Known Allergies  PAST MEDICAL HISTORY: Past Medical History:  Diagnosis Date  . DVT (deep venous thrombosis) (HCC)   . Essential hypertension     MEDICATIONS AT HOME: Prior to Admission medications   Medication Sig Start Date End Date Taking? Authorizing Provider  albuterol (PROVENTIL HFA;VENTOLIN HFA) 108 (90 Base) MCG/ACT inhaler Inhale 2 puffs into the lungs every 4 (four) hours as needed for wheezing or shortness of breath. 10/11/15   Arby BarretteMarcy Pfeiffer, MD  famotidine (PEPCID) 20 MG tablet Take 20 mg by mouth daily as needed for heartburn.  05/22/16   Historical Provider, MD  FLUoxetine (PROZAC) 20 MG tablet Take 1 tablet (20 mg total) by mouth daily. 04/09/16   Jaclyn ShaggyEnobong Amao, MD  hydrochlorothiazide (HYDRODIURIL) 25 MG tablet Take 1 tablet (25 mg total) by mouth daily. 06/01/16   Anders SimmondsAngela M Rahil Passey, PA-C  lisinopril (PRINIVIL,ZESTRIL) 10 MG tablet Take 1 tablet (10 mg  total) by mouth daily. 06/01/16   Anders SimmondsAngela M Tamyah Cutbirth, PA-C  metFORMIN (GLUCOPHAGE) 500 MG tablet Take 1 tablet (500 mg total) by mouth 2 (two) times daily with a meal. 06/01/16   Anders SimmondsAngela M Aylla Huffine, PA-C  ondansetron (ZOFRAN) 4 MG tablet Take 1 tablet (4 mg total) by mouth every 6 (six) hours. 05/29/16   Santiago GladHeather Laisure, PA-C  oxyCODONE-acetaminophen (PERCOCET/ROXICET) 5-325 MG tablet Take 1-2 tablets by mouth every 6 (six) hours as needed for severe pain. 05/29/16   Heather Laisure, PA-C  rivaroxaban (XARELTO) 20 MG TABS tablet Take 1 tablet (20 mg total) by mouth daily with supper. 12/27/14   Doris Cheadleeepak Advani, MD  Spacer/Aero-Holding Chambers (AEROCHAMBER PLUS WITH MASK) inhaler Use as instructed 10/11/15   Arby BarretteMarcy Pfeiffer, MD     Objective:  EXAM:   Vitals:   06/01/16 1131  BP: (!) 144/99  Pulse: 94  Resp: 16  Temp: 98 F (36.7 C)  TempSrc: Oral  SpO2: 95%  Weight: 285 lb 12.8 oz (129.6 kg)    General appearance : A&OX3. NAD. Non-toxic-appearing HEENT: Atraumatic and Normocephalic.  PERRLA. EOM intact.  TM clear B. Mouth-MMM, post pharynx WNL w/o erythema, No PND. Neck: supple, no JVD. No cervical lymphadenopathy. No thyromegaly Chest/Lungs:  Breathing-non-labored, Good air entry bilaterally, breath sounds normal without rales, rhonchi, or wheezing  CVS: S1 S2 regular, no murmurs, gallops, rubs  Abdomen: Bowel sounds present, Non- distended with no gaurding, rigidity or  rebound. There is mild TTP in the RUQ with neg Murphy's sign Extremities: Bilateral Lower Ext shows no edema, both legs are warm to touch with = pulse throughout Neurology:  CN II-XII grossly intact, Non focal.   Psych:  TP linear. J/I WNL. Normal speech. Appropriate eye contact and affect.  Skin:  No Rash  Data Review Lab Results  Component Value Date   HGBA1C 6.6 06/01/2016   HGBA1C 5.7 (H) 12/08/2014     Assessment & Plan   1. Hyperglycemia with new diagnosis of Type 2 Diabetes -start metformin 500mg  bid.   Diabetic diet discussed and encouraged.  - POCT glycosylated hemoglobin (Hb A1C)  2. Essential hypertension Uncontrolled-out of meds Restart - lisinopril (PRINIVIL,ZESTRIL) 10 MG tablet; Take 1 tablet (10 mg total) by mouth daily.  Dispense: 30 tablet; Refill: 3 - hydrochlorothiazide (HYDRODIURIL) 25 MG tablet; Take 1 tablet (25 mg total) by mouth daily.  Dispense: 30 tablet; Refill: 3  3. Calculus of gallbladder with cholecystitis of other acuity without obstruction Keep surgical appt    Patient have been counseled extensively about nutrition and exercise  Return in about 4 weeks (around 06/29/2016), or f/up Dr. Noralyn Pick; diabetes.  The patient was given clear instructions to go to ER or return to medical center if symptoms don't improve, worsen or new problems develop. The patient verbalized understanding. The patient was told to call to get lab results if they haven't heard anything in the next week.     Georgian Co, PA-C [facility] [place], [place] [phone]   06/01/2016, 2:03 PM

## 2016-06-05 ENCOUNTER — Emergency Department (HOSPITAL_COMMUNITY): Payer: Self-pay

## 2016-06-05 ENCOUNTER — Encounter (HOSPITAL_COMMUNITY): Payer: Self-pay

## 2016-06-05 ENCOUNTER — Inpatient Hospital Stay (HOSPITAL_COMMUNITY)
Admission: EM | Admit: 2016-06-05 | Discharge: 2016-06-08 | DRG: 418 | Disposition: A | Discharge status: Home / Self Care | Payer: Self-pay | Attending: General Surgery | Admitting: General Surgery

## 2016-06-05 DIAGNOSIS — I10 Essential (primary) hypertension: Secondary | ICD-10-CM | POA: Diagnosis present

## 2016-06-05 DIAGNOSIS — Z6841 Body Mass Index (BMI) 40.0 and over, adult: Secondary | ICD-10-CM

## 2016-06-05 DIAGNOSIS — K8070 Calculus of gallbladder and bile duct without cholecystitis without obstruction: Secondary | ICD-10-CM

## 2016-06-05 DIAGNOSIS — Z7901 Long term (current) use of anticoagulants: Secondary | ICD-10-CM

## 2016-06-05 DIAGNOSIS — F329 Major depressive disorder, single episode, unspecified: Secondary | ICD-10-CM | POA: Diagnosis present

## 2016-06-05 DIAGNOSIS — Z23 Encounter for immunization: Secondary | ICD-10-CM

## 2016-06-05 DIAGNOSIS — Z7984 Long term (current) use of oral hypoglycemic drugs: Secondary | ICD-10-CM

## 2016-06-05 DIAGNOSIS — E871 Hypo-osmolality and hyponatremia: Secondary | ICD-10-CM | POA: Diagnosis present

## 2016-06-05 DIAGNOSIS — Z86718 Personal history of other venous thrombosis and embolism: Secondary | ICD-10-CM

## 2016-06-05 DIAGNOSIS — K8066 Calculus of gallbladder and bile duct with acute and chronic cholecystitis without obstruction: Principal | ICD-10-CM | POA: Diagnosis present

## 2016-06-05 DIAGNOSIS — K660 Peritoneal adhesions (postprocedural) (postinfection): Secondary | ICD-10-CM | POA: Diagnosis present

## 2016-06-05 DIAGNOSIS — K810 Acute cholecystitis: Secondary | ICD-10-CM | POA: Diagnosis present

## 2016-06-05 DIAGNOSIS — E119 Type 2 diabetes mellitus without complications: Secondary | ICD-10-CM | POA: Diagnosis present

## 2016-06-05 HISTORY — DX: Calculus of gallbladder without cholecystitis without obstruction: K80.20

## 2016-06-05 HISTORY — DX: Type 2 diabetes mellitus without complications: E11.9

## 2016-06-05 LAB — CBC WITH DIFFERENTIAL/PLATELET
BASOS PCT: 0 %
Basophils Absolute: 0 10*3/uL (ref 0.0–0.1)
EOS ABS: 0.2 10*3/uL (ref 0.0–0.7)
EOS PCT: 1 %
HCT: 46.5 % (ref 39.0–52.0)
Hemoglobin: 16.4 g/dL (ref 13.0–17.0)
LYMPHS PCT: 23 %
Lymphs Abs: 3.5 10*3/uL (ref 0.7–4.0)
MCH: 29.5 pg (ref 26.0–34.0)
MCHC: 35.3 g/dL (ref 30.0–36.0)
MCV: 83.6 fL (ref 78.0–100.0)
Monocytes Absolute: 1.7 10*3/uL — ABNORMAL HIGH (ref 0.1–1.0)
Monocytes Relative: 11 %
NEUTROS PCT: 65 %
Neutro Abs: 9.8 10*3/uL — ABNORMAL HIGH (ref 1.7–7.7)
PLATELETS: 226 10*3/uL (ref 150–400)
RBC: 5.56 MIL/uL (ref 4.22–5.81)
RDW: 13.9 % (ref 11.5–15.5)
WBC: 15.2 10*3/uL — ABNORMAL HIGH (ref 4.0–10.5)

## 2016-06-05 LAB — LIPASE, BLOOD: LIPASE: 24 U/L (ref 11–51)

## 2016-06-05 LAB — COMPREHENSIVE METABOLIC PANEL
ALK PHOS: 64 U/L (ref 38–126)
ALT: 51 U/L (ref 17–63)
AST: 21 U/L (ref 15–41)
Albumin: 4 g/dL (ref 3.5–5.0)
Anion gap: 9 (ref 5–15)
BILIRUBIN TOTAL: 0.8 mg/dL (ref 0.3–1.2)
BUN: 16 mg/dL (ref 6–20)
CO2: 25 mmol/L (ref 22–32)
CREATININE: 1.29 mg/dL — AB (ref 0.61–1.24)
Calcium: 9.5 mg/dL (ref 8.9–10.3)
Chloride: 100 mmol/L — ABNORMAL LOW (ref 101–111)
GFR calc Af Amer: >60 mL/min (ref >60)
Glucose, Bld: 118 mg/dL — ABNORMAL HIGH (ref 65–99)
Potassium: 4.2 mmol/L (ref 3.5–5.1)
Sodium: 134 mmol/L — ABNORMAL LOW (ref 135–145)
TOTAL PROTEIN: 7.1 g/dL (ref 6.5–8.1)

## 2016-06-05 LAB — URINE MICROSCOPIC-ADD ON

## 2016-06-05 LAB — URINALYSIS, ROUTINE W REFLEX MICROSCOPIC
Glucose, UA: NEGATIVE mg/dL
Hgb urine dipstick: NEGATIVE
KETONES UR: 15 mg/dL — AB
NITRITE: NEGATIVE
PH: 5.5 (ref 5.0–8.0)
PROTEIN: NEGATIVE mg/dL
Specific Gravity, Urine: 1.03 (ref 1.005–1.030)

## 2016-06-05 LAB — I-STAT TROPONIN, ED: TROPONIN I, POC: 0 ng/mL (ref 0.00–0.08)

## 2016-06-05 LAB — CBG MONITORING, ED: Glucose-Capillary: 137 mg/dL — ABNORMAL HIGH (ref 65–99)

## 2016-06-05 LAB — GLUCOSE, CAPILLARY: GLUCOSE-CAPILLARY: 160 mg/dL — AB (ref 65–99)

## 2016-06-05 MED ORDER — HYDROCODONE-ACETAMINOPHEN 5-325 MG PO TABS
1.0000 | ORAL_TABLET | ORAL | Status: DC | PRN
  Administered 2016-06-06 – 2016-06-08 (×7): 2 via ORAL
  Filled 2016-06-05 (×7): qty 2

## 2016-06-05 MED ORDER — ONDANSETRON HCL 4 MG/2ML IJ SOLN: 4.0000 mg | Freq: Four times a day (QID) | INTRAMUSCULAR | Status: DC | PRN

## 2016-06-05 MED ORDER — DEXTROSE 5 % IV SOLN
2.0000 g | INTRAVENOUS | Status: DC
  Administered 2016-06-05 – 2016-06-07 (×3): 2 g via INTRAVENOUS
  Filled 2016-06-05 (×4): qty 2

## 2016-06-05 MED ORDER — ONDANSETRON 4 MG PO TBDP
4.0000 mg | ORAL_TABLET | Freq: Four times a day (QID) | ORAL | Status: DC | PRN
  Filled 2016-06-05: qty 1

## 2016-06-05 MED ORDER — MORPHINE SULFATE (PF) 4 MG/ML IV SOLN
4.0000 mg | Freq: Once | INTRAVENOUS | Status: AC
  Administered 2016-06-05: 4 mg via INTRAVENOUS
  Filled 2016-06-05: qty 1

## 2016-06-05 MED ORDER — SODIUM CHLORIDE 0.9 % IV BOLUS (SEPSIS)
1000.0000 mL | Freq: Once | INTRAVENOUS | Status: AC
  Administered 2016-06-05: 1000 mL via INTRAVENOUS

## 2016-06-05 MED ORDER — INSULIN ASPART 100 UNIT/ML ~~LOC~~ SOLN
0.0000 [IU] | Freq: Three times a day (TID) | SUBCUTANEOUS | Status: DC
  Administered 2016-06-06 (×3): 3 [IU] via SUBCUTANEOUS
  Administered 2016-06-07: 4 [IU] via SUBCUTANEOUS
  Administered 2016-06-07: 3 [IU] via SUBCUTANEOUS
  Administered 2016-06-08: 4 [IU] via SUBCUTANEOUS

## 2016-06-05 MED ORDER — HYDROMORPHONE HCL 1 MG/ML IJ SOLN
1.0000 mg | INTRAMUSCULAR | Status: DC | PRN
  Administered 2016-06-05: 1 mg via INTRAVENOUS
  Filled 2016-06-05: qty 1

## 2016-06-05 MED ORDER — ENOXAPARIN SODIUM 40 MG/0.4ML ~~LOC~~ SOLN
40.0000 mg | Freq: Every day | SUBCUTANEOUS | Status: DC
  Administered 2016-06-06: 40 mg via SUBCUTANEOUS
  Filled 2016-06-05: qty 0.4

## 2016-06-05 MED ORDER — ONDANSETRON HCL 4 MG/2ML IJ SOLN
4.0000 mg | Freq: Once | INTRAMUSCULAR | Status: AC
Start: 2016-06-05 — End: 2016-06-05
  Administered 2016-06-05: 4 mg via INTRAVENOUS
  Filled 2016-06-05: qty 2

## 2016-06-05 NOTE — H&P (Signed)
David Dunlap is an 45 y.o. male.   Chief Complaint: Right upper quadrant abdominal pain HPI: Patient is a 45 year old male with a 2 to three-month history of abdominal pain. His previously diagnosed with gallstones. Patient has presented to the ER on several occasions secondary to right upper quadrant pain. Patient's had normal LFTs. Patient is due to see surgery however didn't secondary to funds.   Patient presented again today secondary to right upper quadrant abdominal pain. He states that the pain usually begins after fatty foods. Patient has a normal laboratory workup.  Of note the patient has a history of DVTs. Currently on Xarelto secondary to history of DVTs. Patient's last dose was on Monday.  Past Medical History:  Diagnosis Date  . DVT (deep venous thrombosis) ([place])   . Essential hypertension     Past Surgical History:  Procedure Laterality Date  . LIGAMENT REPAIR     R forearm    Family History  Problem Relation Age of Onset  . Diabetes Mother   . Diabetes Father   . Hypertension Sister    Social History:  reports that he has never smoked. He has never used smokeless tobacco. He reports that he uses drugs, including Marijuana. He reports that he does not drink alcohol.  Allergies: No Known Allergies   (Not in a hospital admission)  Results for orders placed or performed during the hospital encounter of 06/05/16 (from the past 48 hour(s))  I-stat troponin, ED     Status: None   Collection Time: 06/05/16  3:47 PM  Result Value Ref Range   Troponin i, poc 0.00 0.00 - 0.08 ng/mL   Comment 3            Comment: Due to the release kinetics of cTnI, a negative result within the first hours of the onset of symptoms does not rule out myocardial infarction with certainty. If myocardial infarction is still suspected, repeat the test at appropriate intervals.   Comprehensive metabolic panel     Status: Abnormal   Collection Time: 06/05/16  4:02 PM  Result Value  Ref Range   Sodium 134 (L) 135 - 145 mmol/L   Potassium 4.2 3.5 - 5.1 mmol/L   Chloride 100 (L) 101 - 111 mmol/L   CO2 25 22 - 32 mmol/L   Glucose, Bld 118 (H) 65 - 99 mg/dL   BUN 16 6 - 20 mg/dL   Creatinine, Ser 1.29 (H) 0.61 - 1.24 mg/dL   Calcium 9.5 8.9 - 10.3 mg/dL   Total Protein 7.1 6.5 - 8.1 g/dL   Albumin 4.0 3.5 - 5.0 g/dL   AST 21 15 - 41 U/L   ALT 51 17 - 63 U/L   Alkaline Phosphatase 64 38 - 126 U/L   Total Bilirubin 0.8 0.3 - 1.2 mg/dL   GFR calc non Af Amer >60 >60 mL/min   GFR calc Af Amer >60 >60 mL/min    Comment: (NOTE) The eGFR has been calculated using the CKD EPI equation. This calculation has not been validated in all clinical situations. eGFR's persistently <60 mL/min signify possible Chronic Kidney Disease.    Anion gap 9 5 - 15  Lipase, blood     Status: None   Collection Time: 06/05/16  4:02 PM  Result Value Ref Range   Lipase 24 11 - 51 U/L  Urinalysis, Routine w reflex microscopic     Status: Abnormal   Collection Time: 06/05/16  4:06 PM  Result Value Ref Range  Color, Urine AMBER (A) YELLOW    Comment: BIOCHEMICALS MAY BE AFFECTED BY COLOR   APPearance CLEAR CLEAR   Specific Gravity, Urine 1.030 1.005 - 1.030   pH 5.5 5.0 - 8.0   Glucose, UA NEGATIVE NEGATIVE mg/dL   Hgb urine dipstick NEGATIVE NEGATIVE   Bilirubin Urine SMALL (A) NEGATIVE   Ketones, ur 15 (A) NEGATIVE mg/dL   Protein, ur NEGATIVE NEGATIVE mg/dL   Nitrite NEGATIVE NEGATIVE   Leukocytes, UA SMALL (A) NEGATIVE  Urine microscopic-add on     Status: Abnormal   Collection Time: 06/05/16  4:06 PM  Result Value Ref Range   Squamous Epithelial / LPF 0-5 (A) NONE SEEN   WBC, UA 6-30 0 - 5 WBC/hpf   RBC / HPF 0-5 0 - 5 RBC/hpf   Bacteria, UA FEW (A) NONE SEEN   Urine-Other MUCOUS PRESENT   CBC with Differential/Platelet     Status: Abnormal   Collection Time: 06/05/16  7:02 PM  Result Value Ref Range   WBC 15.2 (H) 4.0 - 10.5 K/uL   RBC 5.56 4.22 - 5.81 MIL/uL   Hemoglobin  16.4 13.0 - 17.0 g/dL   HCT 46.5 39.0 - 52.0 %   MCV 83.6 78.0 - 100.0 fL   MCH 29.5 26.0 - 34.0 pg   MCHC 35.3 30.0 - 36.0 g/dL   RDW 13.9 11.5 - 15.5 %   Platelets 226 150 - 400 K/uL   Neutrophils Relative % 65 %   Lymphocytes Relative 23 %   Monocytes Relative 11 %   Eosinophils Relative 1 %   Basophils Relative 0 %   Neutro Abs 9.8 (H) 1.7 - 7.7 K/uL   Lymphs Abs 3.5 0.7 - 4.0 K/uL   Monocytes Absolute 1.7 (H) 0.1 - 1.0 K/uL   Eosinophils Absolute 0.2 0.0 - 0.7 K/uL   Basophils Absolute 0.0 0.0 - 0.1 K/uL   RBC Morphology TARGET CELLS    WBC Morphology ATYPICAL LYMPHOCYTES    Dg Chest 2 View  Result Date: 06/05/2016 CLINICAL DATA:  Chest pain and cough EXAM: CHEST  2 VIEW COMPARISON:  04/28/2016 FINDINGS: The heart size and mediastinal contours are within normal limits. Both lungs are clear. The visualized skeletal structures are unremarkable. IMPRESSION: No active cardiopulmonary disease. Electronically Signed   By: Franchot Gallo M.D.   On: 06/05/2016 15:05    Review of Systems  Constitutional: Negative for chills, fever and weight loss.  HENT: Negative for ear pain, hearing loss and tinnitus.   Eyes: Negative for blurred vision, double vision, photophobia and pain.  Respiratory: Negative for cough, hemoptysis and sputum production.   Cardiovascular: Negative for chest pain, palpitations, orthopnea and claudication.  Gastrointestinal: Positive for abdominal pain, nausea and vomiting. Negative for blood in stool, constipation, diarrhea and heartburn.  Genitourinary: Negative for dysuria, frequency, hematuria and urgency.  Musculoskeletal: Negative for back pain, joint pain, myalgias and neck pain.  Skin: Negative for itching and rash.  Neurological: Negative for dizziness, tingling, tremors, sensory change and headaches.  Endo/Heme/Allergies: Negative for environmental allergies. Does not bruise/bleed easily.  Psychiatric/Behavioral: Negative for depression, substance abuse  and suicidal ideas.    Blood pressure 124/82, pulse 82, temperature 98.2 F (36.8 C), temperature source Oral, resp. rate 15, height _0  (1.753 m), weight 129.3 kg (285 lb), SpO2 96 %. Physical Exam  Constitutional: He is oriented to person, place, and time. He appears well-developed and well-nourished.  HENT:  Head: Normocephalic and atraumatic.  Eyes: Conjunctivae and EOM  are normal. Pupils are equal, round, and reactive to light.  Neck: Normal range of motion. Neck supple. No JVD present. No tracheal deviation present. No thyromegaly present.  Cardiovascular: Normal rate and regular rhythm.  Exam reveals friction rub. Exam reveals no gallop.   No murmur heard. Respiratory: Effort normal and breath sounds normal. No stridor. No respiratory distress. He has no wheezes. He has no rales. He exhibits no tenderness.  GI: Soft. Bowel sounds are normal. He exhibits no distension and no mass. There is tenderness (RUQ). There is no rebound and no guarding.  Musculoskeletal: Normal range of motion. He exhibits no edema, tenderness or deformity.  Neurological: He is alert and oriented to person, place, and time.  Skin: Skin is warm and dry.     Assessment/Plan 45 year old male with likely chronic cholecystitis Past Medical History:  Diagnosis Date  . DVT (deep venous thrombosis) ([place])   . Essential hypertension     1. We'll admit the patient, nothing by mouth, IV fluids, IV antibiotics. 2. Hold Xarelto 3. Patient will likely require laparoscopic cholecystectomy +/- IOC in the next 2-3 days. This will allow the Xarelto to be cleared prior to surgery.  Reyes Ivan, MD 06/05/2016, 7:44 PM

## 2016-06-05 NOTE — ED Notes (Signed)
Gave pt clear liquids

## 2016-06-05 NOTE — ED Notes (Signed)
CBG is 137. 

## 2016-06-05 NOTE — ED Notes (Signed)
Pt stated that his "pain is a 10". Informed Eric - RN.

## 2016-06-05 NOTE — ED Triage Notes (Signed)
Per pT, Pt is coming from home with complaints of chest pain that started this morning while he was lying down. Pt reports having left sided sharp chest pain. Complains of RUQ pain that radiates to his back with Hx of Gallstones. Reports diarrhea throughout today. Denies N/V, Sob, or dizziness.

## 2016-06-05 NOTE — ED Provider Notes (Signed)
[facility]-EMERGENCY DEPT Provider Note   CSN: [id number] Arrival date & time: 06/05/16  1359     History   Chief Complaint Chief Complaint  Patient presents with  . Chest Pain  . Abdominal Pain    HPI David Dunlap is a 45 y.o. male.  HPI   45 year old male with a recent diagnosis of gallstones, history of DVT currently on Xarelto presenting from home with complaints of right upper quadrant abdominal pain and chest pain. Patient report he was diagnosed with gallstones 4 months ago and has had recurrent upper abdominal pain. For the past 5 days his upper abdominal pain has intensified. He described pain as a sharp sensation, 10 out of 10, radiates across his abdomen, worsening with eating or with movement. He also reported having persistent diarrhea more than 5 times daily for the past several days, today he noticed small amount of blood mixed with stool. He reports his chest pain is accompanied with his abdominal pain and felt that both of the same. Does report having a nonproductive cough for the past 2 weeks which is not new for him. He denies having fever, chills, lightheadedness, dizziness, shortness of breath, back pain, dysuria  Past Medical History:  Diagnosis Date  . DVT (deep venous thrombosis) (HCC)   . Essential hypertension     Patient Active Problem List   Diagnosis Date Noted  . Cholelithiasis 04/09/2016  . Major depressive disorder, single episode 04/09/2016  . Leg DVT (deep venous thromboembolism), chronic (HCC) 12/08/2014  . Family history of diabetes mellitus (DM) 12/08/2014  . DVT, femoral, acute (HCC) 03/22/2014  . Moderate major depression, single episode (HCC) 08/22/2011  . Constipation 11/22/2010  . ANKLE EDEMA 10/19/2010  . Essential hypertension 10/12/2010    Past Surgical History:  Procedure Laterality Date  . LIGAMENT REPAIR     R forearm       Home Medications    Prior to Admission medications   Medication Sig Start Date End Date  Taking? Authorizing Provider  albuterol (PROVENTIL HFA;VENTOLIN HFA) 108 (90 Base) MCG/ACT inhaler Inhale 2 puffs into the lungs every 4 (four) hours as needed for wheezing or shortness of breath. 10/11/15  Yes Arby Barrette, MD  famotidine (PEPCID) 20 MG tablet Take 20 mg by mouth daily as needed for heartburn.  05/22/16  Yes Historical Provider, MD  FLUoxetine (PROZAC) 20 MG tablet Take 1 tablet (20 mg total) by mouth daily. 04/09/16  Yes Jaclyn Shaggy, MD  hydrochlorothiazide (HYDRODIURIL) 25 MG tablet Take 1 tablet (25 mg total) by mouth daily. 06/01/16  Yes Marzella Schlein McClung, PA-C  lisinopril (PRINIVIL,ZESTRIL) 10 MG tablet Take 1 tablet (10 mg total) by mouth daily. 06/01/16  Yes Anders Simmonds, PA-C  metFORMIN (GLUCOPHAGE) 500 MG tablet Take 1 tablet (500 mg total) by mouth 2 (two) times daily with a meal. 06/01/16  Yes Anders Simmonds, PA-C  oxyCODONE-acetaminophen (PERCOCET/ROXICET) 5-325 MG tablet Take 1-2 tablets by mouth every 6 (six) hours as needed for severe pain. 05/29/16  Yes Heather Laisure, PA-C  rivaroxaban (XARELTO) 20 MG TABS tablet Take 1 tablet (20 mg total) by mouth daily with supper. 12/27/14  Yes Doris Cheadle, MD  Spacer/Aero-Holding Chambers (AEROCHAMBER PLUS WITH MASK) inhaler Use as instructed 10/11/15  Yes Arby Barrette, MD  ondansetron (ZOFRAN) 4 MG tablet Take 1 tablet (4 mg total) by mouth every 6 (six) hours. Patient not taking: Reported on 06/05/2016 05/29/16   Santiago Glad, PA-C    Family History Family History  Problem Relation Age of Onset  . Diabetes Mother   . Diabetes Father   . Hypertension Sister     Social History Social History  Substance Use Topics  . Smoking status: Never Smoker  . Smokeless tobacco: Never Used  . Alcohol use No     Allergies   Review of patient's allergies indicates no known allergies.   Review of Systems Review of Systems  All other systems reviewed and are negative.    Physical Exam Updated Vital Signs BP 125/90  (BP Location: Right Arm)   Pulse 86   Temp 98.2 F (36.8 C) (Oral)   Resp 21   Ht 5\' 9"  (1.753 m)   Wt 129.3 kg   SpO2 98%   BMI 42.09 kg/m   Physical Exam  Constitutional: He appears well-developed and well-nourished. No distress.  HENT:  Head: Atraumatic.  Eyes: Conjunctivae are normal.  Neck: Neck supple.  Cardiovascular: Normal rate and regular rhythm.   Pulmonary/Chest: Effort normal and breath sounds normal.  Abdominal: Soft. There is tenderness (Diffuse abdominal tenderness on palpation. Abdomen is mildly distended. Tenderness to right upper quadrant. Negative McBurney's point.).  Neurological: He is alert.  Skin: No rash noted.  Psychiatric: He has a normal mood and affect.  Nursing note and vitals reviewed.    ED Treatments / Results  Labs (all labs ordered are listed, but only abnormal results are displayed) Labs Reviewed  COMPREHENSIVE METABOLIC PANEL - Abnormal; Notable for the following:       Result Value   Sodium 134 (*)    Chloride 100 (*)    Glucose, Bld 118 (*)    Creatinine, Ser 1.29 (*)    All other components within normal limits  LIPASE, BLOOD  BASIC METABOLIC PANEL  CBC  LIPASE, BLOOD  URINALYSIS, ROUTINE W REFLEX MICROSCOPIC (NOT AT [facility])  CBC WITH DIFFERENTIAL/PLATELET  I-STAT TROPOININ, ED    EKG  EKG Interpretation None     ED ECG REPORT   Date: 06/05/2016  Rate: 98  Rhythm: normal sinus rhythm  QRS Axis: normal  Intervals: normal  ST/T Wave abnormalities: nonspecific ST/T changes  Conduction Disutrbances:nonspecific intraventricular conduction delay  Narrative Interpretation:   Old EKG Reviewed: unchanged  I have personally reviewed the EKG tracing and agree with the computerized printout as noted.   Radiology Dg Chest 2 View  Result Date: 06/05/2016 CLINICAL DATA:  Chest pain and cough EXAM: CHEST  2 VIEW COMPARISON:  04/28/2016 FINDINGS: The heart size and mediastinal contours are within normal limits. Both lungs are  clear. The visualized skeletal structures are unremarkable. IMPRESSION: No active cardiopulmonary disease. Electronically Signed   By: Marlan Palauharles  Clark M.D.   On: 06/05/2016 15:05    Procedures Procedures (including critical care time)  Medications Ordered in ED Medications - No data to display   Initial Impression / Assessment and Plan / ED Course  I have reviewed the triage vital signs and the nursing notes.  Pertinent labs & imaging results that were available during my care of the patient were reviewed by me and considered in my medical decision making (see chart for details).  Clinical Course    BP 130/87 (BP Location: Right Arm)   Pulse 81   Temp 98.2 F (36.8 C) (Oral)   Resp 12   Ht 5\' 9"  (1.753 m)   Wt 129.3 kg   SpO2 99%   BMI 42.09 kg/m    Final Clinical Impressions(s) / ED Diagnoses   Final diagnoses:  Calculus of gallbladder and bile duct without cholecystitis or obstruction    New Prescriptions New Prescriptions   No medications on file   6:03 PM Patient with history of gallstones here with recurrent right upper quadrant abdominal pain and chest pain. Pain is likely secondary to his gallstone. His last ultrasound of his abdominal gallbladder was done less than a week ago shown evidence of gallstone but without evidence of cholecystitis. He has tried to follow-up with surgery but unable to see a specialist due to inability to pay the front cost. Patient has an orange card. Today, his labs are reassuring. His chest pain is atypical for ACS.  6:49 PM Appreciate consultation from General Surgery Dr. Derrell Lolling who will see pt in the ED and will determine disposition.  Care discussed with Dr. Dalene Seltzer.    7:14 PM Dr. Derrell Lolling has seen and evaluated pt and plan to have pt admitted for further management.  SInce pt is on Xarelto, he will likely need to be admitted for several days before surgery can be performed.     Fayrene Helper, PA-C 06/05/16 1917    Alvira Monday, MD 06/06/16 [phone]

## 2016-06-05 NOTE — ED Notes (Signed)
Gave pt chicken broth per RN.

## 2016-06-06 ENCOUNTER — Encounter (HOSPITAL_COMMUNITY): Payer: Self-pay

## 2016-06-06 LAB — CBC
HEMATOCRIT: 42.7 % (ref 39.0–52.0)
Hemoglobin: 14.6 g/dL (ref 13.0–17.0)
MCH: 28.6 pg (ref 26.0–34.0)
MCHC: 34.2 g/dL (ref 30.0–36.0)
MCV: 83.7 fL (ref 78.0–100.0)
PLATELETS: 187 10*3/uL (ref 150–400)
RBC: 5.1 MIL/uL (ref 4.22–5.81)
RDW: 13.8 % (ref 11.5–15.5)
WBC: 12.7 10*3/uL — ABNORMAL HIGH (ref 4.0–10.5)

## 2016-06-06 LAB — GLUCOSE, CAPILLARY
GLUCOSE-CAPILLARY: 141 mg/dL — AB (ref 65–99)
Glucose-Capillary: 144 mg/dL — ABNORMAL HIGH (ref 65–99)
Glucose-Capillary: 134 mg/dL — ABNORMAL HIGH (ref 65–99)
Glucose-Capillary: 125 mg/dL — ABNORMAL HIGH (ref 65–99)

## 2016-06-06 LAB — COMPREHENSIVE METABOLIC PANEL
ALT: 48 U/L (ref 17–63)
AST: 18 U/L (ref 15–41)
Albumin: 3.4 g/dL — ABNORMAL LOW (ref 3.5–5.0)
Alkaline Phosphatase: 54 U/L (ref 38–126)
Anion gap: 6 (ref 5–15)
BUN: 12 mg/dL (ref 6–20)
CHLORIDE: 101 mmol/L (ref 101–111)
CO2: 25 mmol/L (ref 22–32)
CREATININE: 1.17 mg/dL (ref 0.61–1.24)
Calcium: 8.5 mg/dL — ABNORMAL LOW (ref 8.9–10.3)
GFR calc Af Amer: >60 mL/min (ref >60)
GFR calc non Af Amer: >60 mL/min (ref >60)
Glucose, Bld: 129 mg/dL — ABNORMAL HIGH (ref 65–99)
POTASSIUM: 4 mmol/L (ref 3.5–5.1)
SODIUM: 132 mmol/L — AB (ref 135–145)
Total Bilirubin: 1 mg/dL (ref 0.3–1.2)
Total Protein: 6.5 g/dL (ref 6.5–8.1)

## 2016-06-06 MED ORDER — PNEUMOCOCCAL VAC POLYVALENT 25 MCG/0.5ML IJ INJ: 0.5000 mL | INJECTION | INTRAMUSCULAR | Status: DC

## 2016-06-06 MED ORDER — INFLUENZA VAC SPLIT QUAD 0.5 ML IM SUSY: 0.5000 mL | PREFILLED_SYRINGE | INTRAMUSCULAR | Status: DC

## 2016-06-06 MED ORDER — POTASSIUM CHLORIDE 2 MEQ/ML IV SOLN
INTRAVENOUS | Status: DC
  Administered 2016-06-06 – 2016-06-07 (×2): via INTRAVENOUS
  Filled 2016-06-06 (×4): qty 1000

## 2016-06-06 NOTE — Progress Notes (Signed)
  Subjective: Less pain  Objective: Vital signs in last 24 hours: Temp:  [97.5 F (36.4 C)-98.4 F (36.9 C)] 97.5 F (36.4 C) (09/20 0347) Pulse Rate:  [63-98] 63 (09/20 0347) Resp:  [12-23] 17 (09/20 0347) BP: (112-137)/(73-99) 115/80 (09/20 0347) SpO2:  [93 %-99 %] 99 % (09/20 0347) Weight:  [129.3 kg (285 lb)-129.7 kg (285 lb 15 oz)] 129.7 kg (285 lb 15 oz) (09/19 2307) Last BM Date: 06/05/16  Intake/Output from previous day: 09/19 0701 - 09/20 0700 In: 1290 [P.O.:240; IV Piggyback:1050] Out: -  Intake/Output this shift: No intake/output data recorded.  General appearance: cooperative Resp: clear to auscultation bilaterally Cardio: regular rate and rhythm GI: soft, mild tenderness RUQ  Lab Results:   Recent Labs  06/05/16 1902 06/06/16 0351  WBC 15.2* 12.7*  HGB 16.4 14.6  HCT 46.5 42.7  PLT 226 187   BMET  Recent Labs  06/05/16 1602 06/06/16 0351  NA 134* 132*  K 4.2 4.0  CL 100* 101  CO2 25 25  GLUCOSE 118* 129*  BUN 16 12  CREATININE 1.29* 1.17  CALCIUM 9.5 8.5*   PT/INR No results for input(s): LABPROT, INR in the last 72 hours. ABG No results for input(s): PHART, HCO3 in the last 72 hours.  Invalid input(s): PCO2, PO2  Studies/Results: Dg Chest 2 View  Result Date: 06/05/2016 CLINICAL DATA:  Chest pain and cough EXAM: CHEST  2 VIEW COMPARISON:  04/28/2016 FINDINGS: The heart size and mediastinal contours are within normal limits. Both lungs are clear. The visualized skeletal structures are unremarkable. IMPRESSION: No active cardiopulmonary disease. Electronically Signed   By: Marlan Palauharles  Clark M.D.   On: 06/05/2016 15:05    Anti-infectives: Anti-infectives    Start     Dose/Rate Route Frequency Ordered Stop   06/05/16 2000  cefTRIAXone (ROCEPHIN) 2 g in dextrose 5 % 50 mL IVPB     2 g 100 mL/hr over 30 Minutes Intravenous Every 24 hours 06/05/16 1915        Assessment/Plan: Chronic cholecystitis - for lap chole/IOC tomorrow.  Procedure, risks, and benefits discussed. He agrees. Clears for now. IV Rocephin. DM - SSI FEM - hyponatremia - change IVF VTE - Lovenox, holding Xarelto for surgery  LOS: 1 day    Leanard Dimaio E 06/06/2016

## 2016-06-06 NOTE — Anesthesia Preprocedure Evaluation (Addendum)
Anesthesia Evaluation  Patient identified by MRN, date of birth, ID band Patient awake    Reviewed: Allergy & Precautions, NPO status , Patient's Chart, lab work & pertinent test results  History of Anesthesia Complications Negative for: history of anesthetic complications  Airway Mallampati: III  TM Distance: >3 FB Neck ROM: Full    Dental  (+) Poor Dentition, Dental Advisory Given,    Pulmonary neg pulmonary ROS,    Pulmonary exam normal breath sounds clear to auscultation       Cardiovascular hypertension, Pt. on medications (-) angina+ DVT (on Xarelto)  (-) Past MI, (-) Cardiac Stents and (-) Orthopnea + dysrhythmias (PVCs)  Rhythm:Regular Rate:Normal     Neuro/Psych PSYCHIATRIC DISORDERS Depression negative neurological ROS     GI/Hepatic Neg liver ROS, neg GERD  ,Acute cholecystitis   Endo/Other  diabetes, Type 2, Oral Hypoglycemic AgentsMorbid obesity  Renal/GU negative Renal ROS     Musculoskeletal   Abdominal (+) + obese,   Peds  Hematology negative hematology ROS (+)   Anesthesia Other Findings   Reproductive/Obstetrics                           Anesthesia Physical Anesthesia Plan  ASA: III  Anesthesia Plan: General   Post-op Pain Management:    Induction: Intravenous  Airway Management Planned: Oral ETT  Additional Equipment:   Intra-op Plan:   Post-operative Plan: Extubation in OR  Informed Consent: I have reviewed the patients History and Physical, chart, labs and discussed the procedure including the risks, benefits and alternatives for the proposed anesthesia with the patient or authorized representative who has indicated his/her understanding and acceptance.   Dental advisory given  Plan Discussed with: CRNA  Anesthesia Plan Comments: (Risks of general anesthesia discussed including, but not limited to, sore throat, hoarse voice, chipped/damaged teeth,  injury to vocal cords, nausea and vomiting, allergic reactions, lung infection, heart attack, stroke, and death. All questions answered. )       Anesthesia Quick Evaluation

## 2016-06-07 ENCOUNTER — Encounter (HOSPITAL_COMMUNITY): Admission: EM | Disposition: A | Discharge status: Home / Self Care | Payer: Self-pay | Source: Home / Self Care

## 2016-06-07 ENCOUNTER — Inpatient Hospital Stay (HOSPITAL_COMMUNITY): Payer: Self-pay | Admitting: Certified Registered Nurse Anesthetist

## 2016-06-07 ENCOUNTER — Encounter (HOSPITAL_COMMUNITY): Payer: Self-pay | Admitting: Certified Registered Nurse Anesthetist

## 2016-06-07 HISTORY — PX: CHOLECYSTECTOMY: SHX55

## 2016-06-07 LAB — COMPREHENSIVE METABOLIC PANEL
ALT: 50 U/L (ref 17–63)
AST: 17 U/L (ref 15–41)
Albumin: 3.3 g/dL — ABNORMAL LOW (ref 3.5–5.0)
Alkaline Phosphatase: 52 U/L (ref 38–126)
Anion gap: 6 (ref 5–15)
BUN: 6 mg/dL (ref 6–20)
CHLORIDE: 102 mmol/L (ref 101–111)
CO2: 27 mmol/L (ref 22–32)
Calcium: 8.8 mg/dL — ABNORMAL LOW (ref 8.9–10.3)
Creatinine, Ser: 1.06 mg/dL (ref 0.61–1.24)
GLUCOSE: 123 mg/dL — AB (ref 65–99)
POTASSIUM: 4.1 mmol/L (ref 3.5–5.1)
Sodium: 135 mmol/L (ref 135–145)
Total Bilirubin: 0.9 mg/dL (ref 0.3–1.2)
Total Protein: 6.5 g/dL (ref 6.5–8.1)

## 2016-06-07 LAB — CBC
HEMATOCRIT: 43.3 % (ref 39.0–52.0)
Hemoglobin: 14.9 g/dL (ref 13.0–17.0)
MCH: 28.7 pg (ref 26.0–34.0)
MCHC: 34.4 g/dL (ref 30.0–36.0)
MCV: 83.4 fL (ref 78.0–100.0)
Platelets: 181 10*3/uL (ref 150–400)
RBC: 5.19 MIL/uL (ref 4.22–5.81)
RDW: 13.7 % (ref 11.5–15.5)
WBC: 10.4 10*3/uL (ref 4.0–10.5)

## 2016-06-07 LAB — SURGICAL PCR SCREEN
MRSA, PCR: NEGATIVE
STAPHYLOCOCCUS AUREUS: POSITIVE — AB

## 2016-06-07 LAB — GLUCOSE, CAPILLARY
GLUCOSE-CAPILLARY: 121 mg/dL — AB (ref 65–99)
GLUCOSE-CAPILLARY: 149 mg/dL — AB (ref 65–99)
GLUCOSE-CAPILLARY: 180 mg/dL — AB (ref 65–99)
GLUCOSE-CAPILLARY: 140 mg/dL — AB (ref 65–99)
Glucose-Capillary: 135 mg/dL — ABNORMAL HIGH (ref 65–99)
Glucose-Capillary: 173 mg/dL — ABNORMAL HIGH (ref 65–99)

## 2016-06-07 LAB — HEMOGLOBIN A1C
HEMOGLOBIN A1C: 6.3 % — AB (ref 4.8–5.6)
MEAN PLASMA GLUCOSE: 134 mg/dL

## 2016-06-07 SURGERY — LAPAROSCOPIC CHOLECYSTECTOMY WITH INTRAOPERATIVE CHOLANGIOGRAM
Anesthesia: General | Site: Abdomen

## 2016-06-07 MED ORDER — LIDOCAINE 2% (20 MG/ML) 5 ML SYRINGE
INTRAMUSCULAR | Status: AC
  Filled 2016-06-07: qty 5

## 2016-06-07 MED ORDER — IOPAMIDOL (ISOVUE-300) INJECTION 61%
INTRAVENOUS | Status: AC
  Filled 2016-06-07: qty 50

## 2016-06-07 MED ORDER — BUPIVACAINE-EPINEPHRINE (PF) 0.25% -1:200000 IJ SOLN
INTRAMUSCULAR | Status: AC
  Filled 2016-06-07: qty 30

## 2016-06-07 MED ORDER — FENTANYL CITRATE (PF) 100 MCG/2ML IJ SOLN
INTRAMUSCULAR | Status: AC
  Filled 2016-06-07: qty 2

## 2016-06-07 MED ORDER — FENTANYL CITRATE (PF) 100 MCG/2ML IJ SOLN
25.0000 ug | INTRAMUSCULAR | Status: DC | PRN
  Administered 2016-06-07 (×2): 25 ug via INTRAVENOUS

## 2016-06-07 MED ORDER — PROMETHAZINE HCL 25 MG/ML IJ SOLN: 6.2500 mg | INTRAMUSCULAR | Status: DC | PRN

## 2016-06-07 MED ORDER — 0.9 % SODIUM CHLORIDE (POUR BTL) OPTIME
TOPICAL | Status: DC | PRN
Start: 2016-06-07 — End: 2016-06-07
  Administered 2016-06-07: 1000 mL

## 2016-06-07 MED ORDER — FENTANYL CITRATE (PF) 100 MCG/2ML IJ SOLN
INTRAMUSCULAR | Status: DC | PRN
  Administered 2016-06-07: 50 ug via INTRAVENOUS
  Administered 2016-06-07: 100 ug via INTRAVENOUS

## 2016-06-07 MED ORDER — PHENYLEPHRINE 40 MCG/ML (10ML) SYRINGE FOR IV PUSH (FOR BLOOD PRESSURE SUPPORT)
PREFILLED_SYRINGE | INTRAVENOUS | Status: AC
  Filled 2016-06-07: qty 10

## 2016-06-07 MED ORDER — PHENYLEPHRINE HCL 10 MG/ML IJ SOLN
INTRAMUSCULAR | Status: DC | PRN
  Administered 2016-06-07: 80 ug via INTRAVENOUS

## 2016-06-07 MED ORDER — MIDAZOLAM HCL 2 MG/2ML IJ SOLN
INTRAMUSCULAR | Status: AC
  Filled 2016-06-07: qty 2

## 2016-06-07 MED ORDER — ONDANSETRON HCL 4 MG/2ML IJ SOLN
INTRAMUSCULAR | Status: DC | PRN
  Administered 2016-06-07: 4 mg via INTRAVENOUS

## 2016-06-07 MED ORDER — SODIUM CHLORIDE 0.9 % IR SOLN
Status: DC | PRN
  Administered 2016-06-07 (×2): 1000 mL

## 2016-06-07 MED ORDER — BUPIVACAINE-EPINEPHRINE 0.25% -1:200000 IJ SOLN
INTRAMUSCULAR | Status: DC | PRN
  Administered 2016-06-07: 15 mL

## 2016-06-07 MED ORDER — OXYCODONE HCL 5 MG PO TABS
5.0000 mg | ORAL_TABLET | ORAL | Status: DC | PRN
  Administered 2016-06-07: 10 mg via ORAL
  Filled 2016-06-07: qty 2

## 2016-06-07 MED ORDER — ROCURONIUM BROMIDE 10 MG/ML (PF) SYRINGE
PREFILLED_SYRINGE | INTRAVENOUS | Status: AC
  Filled 2016-06-07: qty 10

## 2016-06-07 MED ORDER — SODIUM CHLORIDE 0.9 % IV SOLN
INTRAVENOUS | Status: DC | PRN
  Administered 2016-06-07: 100 mL

## 2016-06-07 MED ORDER — SUGAMMADEX SODIUM 500 MG/5ML IV SOLN
INTRAVENOUS | Status: AC
  Filled 2016-06-07: qty 5

## 2016-06-07 MED ORDER — ONDANSETRON HCL 4 MG/2ML IJ SOLN
INTRAMUSCULAR | Status: AC
  Filled 2016-06-07: qty 2

## 2016-06-07 MED ORDER — SUGAMMADEX SODIUM 500 MG/5ML IV SOLN
INTRAVENOUS | Status: DC | PRN
  Administered 2016-06-07: 260 mg via INTRAVENOUS

## 2016-06-07 MED ORDER — PROPOFOL 10 MG/ML IV BOLUS
INTRAVENOUS | Status: AC
  Filled 2016-06-07: qty 20

## 2016-06-07 MED ORDER — LACTATED RINGERS IV SOLN
INTRAVENOUS | Status: DC
  Administered 2016-06-07: 09:00:00 via INTRAVENOUS

## 2016-06-07 MED ORDER — MORPHINE SULFATE (PF) 2 MG/ML IV SOLN: 2.0000 mg | INTRAVENOUS | Status: DC | PRN

## 2016-06-07 MED ORDER — MIDAZOLAM HCL 5 MG/5ML IJ SOLN
INTRAMUSCULAR | Status: DC | PRN
  Administered 2016-06-07: 2 mg via INTRAVENOUS

## 2016-06-07 MED ORDER — PROPOFOL 10 MG/ML IV BOLUS
INTRAVENOUS | Status: DC | PRN
  Administered 2016-06-07: 200 mg via INTRAVENOUS

## 2016-06-07 MED ORDER — SUCCINYLCHOLINE 20MG/ML (10ML) SYRINGE FOR MEDFUSION PUMP - OPTIME
INTRAMUSCULAR | Status: DC | PRN
  Administered 2016-06-07: 140 mg via INTRAVENOUS

## 2016-06-07 MED ORDER — SUCCINYLCHOLINE CHLORIDE 200 MG/10ML IV SOSY
PREFILLED_SYRINGE | INTRAVENOUS | Status: AC
  Filled 2016-06-07: qty 10

## 2016-06-07 MED ORDER — LIDOCAINE HCL (CARDIAC) 20 MG/ML IV SOLN
INTRAVENOUS | Status: DC | PRN
  Administered 2016-06-07: 100 mg via INTRAVENOUS

## 2016-06-07 MED ORDER — LACTATED RINGERS IV SOLN
INTRAVENOUS | Status: DC | PRN
  Administered 2016-06-07: 10:00:00 via INTRAVENOUS

## 2016-06-07 MED ORDER — ROCURONIUM BROMIDE 100 MG/10ML IV SOLN
INTRAVENOUS | Status: DC | PRN
  Administered 2016-06-07: 50 mg via INTRAVENOUS

## 2016-06-07 MED ORDER — ENOXAPARIN SODIUM 40 MG/0.4ML ~~LOC~~ SOLN
40.0000 mg | SUBCUTANEOUS | Status: DC
  Administered 2016-06-08: 40 mg via SUBCUTANEOUS
  Filled 2016-06-07: qty 0.4

## 2016-06-07 SURGICAL SUPPLY — 48 items
APPLIER CLIP 5 13 M/L LIGAMAX5 (MISCELLANEOUS) ×3
CANISTER SUCTION 2500CC (MISCELLANEOUS) ×3 IMPLANT
CHLORAPREP W/TINT 26ML (MISCELLANEOUS) ×3 IMPLANT
CLIP APPLIE 5 13 M/L LIGAMAX5 (MISCELLANEOUS) ×1 IMPLANT
COVER MAYO STAND STRL (DRAPES) ×3 IMPLANT
COVER SURGICAL LIGHT HANDLE (MISCELLANEOUS) ×3 IMPLANT
DRAPE C-ARM 42X72 X-RAY (DRAPES) ×3 IMPLANT
ELECT REM PT RETURN 9FT ADLT (ELECTROSURGICAL) ×3
ELECTRODE REM PT RTRN 9FT ADLT (ELECTROSURGICAL) ×1 IMPLANT
FILTER SMOKE EVAC LAPAROSHD (FILTER) ×3 IMPLANT
GLOVE BIO SURGEON STRL SZ 6 (GLOVE) ×3 IMPLANT
GLOVE BIO SURGEON STRL SZ8 (GLOVE) ×3 IMPLANT
GLOVE BIOGEL PI IND STRL 6.5 (GLOVE) ×1 IMPLANT
GLOVE BIOGEL PI IND STRL 7.0 (GLOVE) ×2 IMPLANT
GLOVE BIOGEL PI IND STRL 8 (GLOVE) ×1 IMPLANT
GLOVE BIOGEL PI INDICATOR 6.5 (GLOVE) ×2
GLOVE BIOGEL PI INDICATOR 7.0 (GLOVE) ×4
GLOVE BIOGEL PI INDICATOR 8 (GLOVE) ×2
GLOVE SURG SS PI 6.5 STRL IVOR (GLOVE) ×3 IMPLANT
GLOVE SURG SS PI 7.0 STRL IVOR (GLOVE) ×3 IMPLANT
GOWN STRL REUS W/ TWL LRG LVL3 (GOWN DISPOSABLE) ×3 IMPLANT
GOWN STRL REUS W/ TWL XL LVL3 (GOWN DISPOSABLE) ×1 IMPLANT
GOWN STRL REUS W/TWL LRG LVL3 (GOWN DISPOSABLE) ×6
GOWN STRL REUS W/TWL XL LVL3 (GOWN DISPOSABLE) ×2
KIT BASIN OR (CUSTOM PROCEDURE TRAY) ×3 IMPLANT
KIT ROOM TURNOVER OR (KITS) ×3 IMPLANT
L-HOOK LAP DISP 36CM (ELECTROSURGICAL) ×3
LHOOK LAP DISP 36CM (ELECTROSURGICAL) ×1 IMPLANT
LIQUID BAND (GAUZE/BANDAGES/DRESSINGS) ×3 IMPLANT
NEEDLE 22X1 1/2 (OR ONLY) (NEEDLE) ×3 IMPLANT
NS IRRIG 1000ML POUR BTL (IV SOLUTION) ×3 IMPLANT
PAD ARMBOARD 7.5X6 YLW CONV (MISCELLANEOUS) ×3 IMPLANT
PENCIL BUTTON HOLSTER BLD 10FT (ELECTRODE) ×3 IMPLANT
POUCH RETRIEVAL ECOSAC 10 (ENDOMECHANICALS) ×1 IMPLANT
POUCH RETRIEVAL ECOSAC 10MM (ENDOMECHANICALS) ×2
SCISSORS LAP 5X35 DISP (ENDOMECHANICALS) ×3 IMPLANT
SET CHOLANGIOGRAPH 5 50 .035 (SET/KITS/TRAYS/PACK) ×3 IMPLANT
SET IRRIG TUBING LAPAROSCOPIC (IRRIGATION / IRRIGATOR) ×3 IMPLANT
SLEEVE ENDOPATH XCEL 5M (ENDOMECHANICALS) ×6 IMPLANT
SPECIMEN JAR SMALL (MISCELLANEOUS) ×3 IMPLANT
SUT MNCRL AB 4-0 PS2 18 (SUTURE) ×3 IMPLANT
SUT VIC AB 4-0 PS2 27 (SUTURE) ×3 IMPLANT
TOWEL OR 17X24 6PK STRL BLUE (TOWEL DISPOSABLE) ×3 IMPLANT
TOWEL OR 17X26 10 PK STRL BLUE (TOWEL DISPOSABLE) IMPLANT
TRAY LAPAROSCOPIC MC (CUSTOM PROCEDURE TRAY) ×3 IMPLANT
TROCAR XCEL BLUNT TIP 100MML (ENDOMECHANICALS) ×3 IMPLANT
TROCAR XCEL NON-BLD 5MMX100MML (ENDOMECHANICALS) ×3 IMPLANT
TUBING INSUFFLATION (TUBING) ×3 IMPLANT

## 2016-06-07 NOTE — Anesthesia Postprocedure Evaluation (Signed)
Anesthesia Post Note  Patient: David Dunlap  Procedure(s) Performed: Procedure(s) (LRB): LAPAROSCOPIC CHOLECYSTECTOMY WITH ATTEMPTED INTRAOPERATIVE CHOLANGIOGRAM (N/A)  Patient location during evaluation: PACU Anesthesia Type: General Level of consciousness: awake and alert Pain management: pain level controlled Vital Signs Assessment: post-procedure vital signs reviewed and stable Respiratory status: spontaneous breathing, nonlabored ventilation and respiratory function stable Cardiovascular status: blood pressure returned to baseline and stable Postop Assessment: no signs of nausea or vomiting Anesthetic complications: no    Last Vitals:  Vitals:   06/07/16 1145 06/07/16 1158  BP:    Pulse: 81 74  Resp: 16 16  Temp:      Last Pain:  Vitals:   06/07/16 1158  TempSrc:   PainSc: 6                  Linton RumpJennifer Dickerson Iyani Dresner

## 2016-06-07 NOTE — Anesthesia Procedure Notes (Signed)
Procedure Name: Intubation Date/Time: 06/07/2016 10:02 AM Performed by: Virgel GessHOLTZMAN, Ellisha Bankson LEFFEW Pre-anesthesia Checklist: Patient identified, Patient being monitored, Timeout performed, Emergency Drugs available and Suction available Patient Re-evaluated:Patient Re-evaluated prior to inductionOxygen Delivery Method: Circle System Utilized Preoxygenation: Pre-oxygenation with 100% oxygen Intubation Type: IV induction Ventilation: Oral airway inserted - appropriate to patient size and Mask ventilation without difficulty Laryngoscope Size: Mac and 4 Grade View: Grade I Tube type: Oral Tube size: 7.5 mm Number of attempts: 1 Airway Equipment and Method: Stylet Placement Confirmation: ETT inserted through vocal cords under direct vision,  positive ETCO2 and breath sounds checked- equal and bilateral Secured at: 21 cm Tube secured with: Tape Dental Injury: Teeth and Oropharynx as per pre-operative assessment

## 2016-06-07 NOTE — Transfer of Care (Signed)
Immediate Anesthesia Transfer of Care Note  Patient: David Dunlap  Procedure(s) Performed: Procedure(s): LAPAROSCOPIC CHOLECYSTECTOMY WITH ATTEMPTED INTRAOPERATIVE CHOLANGIOGRAM (N/A)  Patient Location: PACU  Anesthesia Type:General  Level of Consciousness: awake, alert , oriented and sedated  Airway & Oxygen Therapy: Patient Spontanous Breathing and Patient connected to face mask oxygen  Post-op Assessment: Report given to RN, Post -op Vital signs reviewed and stable and Patient moving all extremities  Post vital signs: Reviewed and stable  Last Vitals:  Vitals:   06/07/16 0357 06/07/16 0903  BP: 127/87 (!) 148/86  Pulse: 69 80  Resp: 18 18  Temp: 37 C 36.9 C    Last Pain:  Vitals:   06/07/16 0903  TempSrc: Oral  PainSc:       Patients Stated Pain Goal: 3 (06/06/16 0640)  Complications: No apparent anesthesia complications

## 2016-06-07 NOTE — Op Note (Signed)
06/05/2016 - 06/07/2016  10:50 AM  PATIENT:  David Dunlap  45 y.o. male  PRE-OPERATIVE DIAGNOSIS:  symptomatic cholelithiasis  POST-OPERATIVE DIAGNOSIS:  Chronic cholecystitis  PROCEDURE:  Procedure(s): LAPAROSCOPIC CHOLECYSTECTOMY  SURGEON:  Surgeon(s): Violeta GelinasBurke Collyns Mcquigg, MD  ASSISTANTS: Bailey MechLiz Simaan   ANESTHESIA:   local and general  EBL:  Total I/O In: 800 [I.V.:800] Out: -   BLOOD ADMINISTERED:none  DRAINS: none   SPECIMEN:  Excision  DISPOSITION OF SPECIMEN:  PATHOLOGY  COUNTS:  YES  DICTATION: .Dragon Dictation Findings: Chronic cholecystitis, cholelithiasis  Procedure in detail: David Dunlap is brought for cholecystectomy. He was identified in the preop holding area. Informed consent was obtained. He is receiving intravenous antibiotics. He was brought to the operating room and general endotracheal anesthesia was administered by the anesthesia staff. His abdomen was prepped and draped in sterile fashion. We did a time out procedure.The infraumbilical region was infiltrated with local. Infraumbilical incision was made. Subcutaneous tissues were dissected down revealing the anterior fascia. This was divided sharply along the midline. Peritoneal cavity was entered under direct vision without complication. A 0 Vicryl pursestring was placed around the fascial opening. Hassan trocar was inserted into the abdomen. The abdomen was insufflated with carbon dioxide in standard fashion. Under direct vision a 5 mm epigastric and 5 mm right abdominal port 2 were placed. Local was used at each port site. The prostatic expiration revealed evidence of chronic cholecystitis. There were some filmy omental adhesions to the gallbladder. The dome was retracted superior medially. The omental adhesions were gently swept down. This revealed the infundibulum which was retracted inferior laterally. Dissection began laterally and progressed medially first identified the cystic artery. This was  clipped twice proximally and divided distally with cautery. Next the cystic duct was dissected until we had a critical view between the cystic duct, gallbladder, and the liver. The cystic duct was only about a centimeter or centimeter and a half long so it was too short to do a cholangiogram. 3 clips were placed proximally on the cystic duct and one was placed distally and it was divided. Gallbladder was taken off using Bovie cautery achieving excellent hemostasis. The gallbladder was placed in a bag and removed from the abdomen. Liver bed was rechecked and it was dry. The area was copiously irrigated and irrigation fluid was evacuated. Liver bed was rechecked and it was dry and clips remain in good position. Ports were removed under direct vision. Pneumoperitoneum was released. Infraumbilical fascia was closed by tying the pursestring. All 4 wounds were irrigated and the skin of each was closed with running 4 Vicryl subcuticular followed by liquid.. All counts were correct and he tolerated the procedure without apparent complications and was taken to recovery in stable condition  PATIENT DISPOSITION:  PACU - hemodynamically stable.   Delay start of Pharmacological VTE agent (>24hrs) due to surgical blood loss or risk of bleeding:  no  Violeta GelinasBurke Jovoni Borkenhagen, MD, MPH, FACS Pager: [phone]  9/21/201710:50 AM

## 2016-06-07 NOTE — Progress Notes (Signed)
  Subjective: Less pain, hungry  Objective: Vital signs in last 24 hours: Temp:  [98 F (36.7 C)-98.8 F (37.1 C)] 98.6 F (37 C) (09/21 0357) Pulse Rate:  [69-78] 69 (09/21 0357) Resp:  [18] 18 (09/21 0357) BP: (127-132)/(87-90) 127/87 (09/21 0357) SpO2:  [96 %-99 %] 99 % (09/21 0357) Last BM Date: 06/05/16  Intake/Output from previous day: 09/20 0701 - 09/21 0700 In: 1620 [P.O.:970; I.V.:600; IV Piggyback:50] Out: -  Intake/Output this shift: No intake/output data recorded.  General appearance: alert Resp: clear to auscultation bilaterally Cardio: regular rate and rhythm GI: soft, mild TTP RUQ  Lab Results:   Recent Labs  06/06/16 0351 06/07/16 0433  WBC 12.7* 10.4  HGB 14.6 14.9  HCT 42.7 43.3  PLT 187 181   BMET  Recent Labs  06/06/16 0351 06/07/16 0433  NA 132* 135  K 4.0 4.1  CL 101 102  CO2 25 27  GLUCOSE 129* 123*  BUN 12 6  CREATININE 1.17 1.06  CALCIUM 8.5* 8.8*   PT/INR No results for input(s): LABPROT, INR in the last 72 hours. ABG No results for input(s): PHART, HCO3 in the last 72 hours.  Invalid input(s): PCO2, PO2  Studies/Results: Dg Chest 2 View  Result Date: 06/05/2016 CLINICAL DATA:  Chest pain and cough EXAM: CHEST  2 VIEW COMPARISON:  04/28/2016 FINDINGS: The heart size and mediastinal contours are within normal limits. Both lungs are clear. The visualized skeletal structures are unremarkable. IMPRESSION: No active cardiopulmonary disease. Electronically Signed   By: Marlan Palauharles  Clark M.D.   On: 06/05/2016 15:05    Anti-infectives: Anti-infectives    Start     Dose/Rate Route Frequency Ordered Stop   06/05/16 2000  cefTRIAXone (ROCEPHIN) 2 g in dextrose 5 % 50 mL IVPB     2 g 100 mL/hr over 30 Minutes Intravenous Every 24 hours 06/05/16 1915        Assessment/Plan: Chronic cholecystitis - for lap chole/IOC this AM. Procedure, risks, and benefits discussed, he agrees. IV Rocephin. DM - SSI FEM - hyponatremia  improved VTE - hold today's Lovenox, holding Xarelto for surgery  LOS: 2 days    [place] Chelf E 06/07/2016

## 2016-06-08 ENCOUNTER — Encounter (HOSPITAL_COMMUNITY): Payer: Self-pay | Admitting: General Surgery

## 2016-06-08 LAB — GLUCOSE, CAPILLARY
GLUCOSE-CAPILLARY: 136 mg/dL — AB (ref 65–99)
Glucose-Capillary: 157 mg/dL — ABNORMAL HIGH (ref 65–99)

## 2016-06-08 MED ORDER — OXYCODONE HCL 5 MG PO TABS: 5.0000 mg | ORAL_TABLET | ORAL | 0 refills | Status: DC | PRN

## 2016-06-08 NOTE — Plan of Care (Signed)
Problem: Pain Managment: Goal: General experience of comfort will improve Outcome: Progressing Medicated once for pain  Problem: Physical Regulation: Goal: Will remain free from infection Outcome: Progressing No signs of infection noted  Problem: Tissue Perfusion: Goal: Risk factors for ineffective tissue perfusion will decrease Outcome: Progressing No S/S of DVT noted  Problem: Activity: Goal: Risk for activity intolerance will decrease Outcome: Progressing oob with assistance and tolerated well  Problem: Bowel/Gastric: Goal: Will not experience complications related to bowel motility Outcome: Progressing No bowel complications noted

## 2016-06-08 NOTE — Discharge Summary (Signed)
Physician Discharge Summary  Patient ID: David Dunlap MRN: [id number] DOB/AGE: 02/23/1971 45 y.o.  Admit date: 06/05/2016 Discharge date: 06/08/2016  Admission Diagnoses:cholecystitis  Discharge Diagnoses: s/p laparoscopic cholecystectomy Active Problems:   Acute cholecystitis   Discharged Condition: good  Hospital Course: Admitted with cholecystitis. His Xarelto was held. He underwent lap chole and did well. D/C home POD#1. Resume home meds including Xarelto.  Consults: None  Significant Diagnostic Studies: see rads  Treatments: surgery: above  Discharge Exam: Blood pressure 130/80, pulse 67, temperature 98.3 F (36.8 C), temperature source Oral, resp. rate 17, height 5\' 9"  (1.753 m), weight 129.7 kg (285 lb 15 oz), SpO2 98 %. General appearance: cooperative Resp: clear to auscultation bilaterally Cardio: regular rate and rhythm GI: dry stain on UMB gauze - changed, soft, other incisions OK  Disposition: 01-Home or Self Care  Discharge Instructions    Call MD for:  persistant nausea and vomiting    Complete by:  As directed    Call MD for:  severe uncontrolled pain    Complete by:  As directed    Diet - low sodium heart healthy    Complete by:  As directed    Discharge instructions    Complete by:  As directed    CC[facility] ______[facility] LAPAROSCOPIC SURGERY: POST OP INSTRUCTIONS Always review your discharge instruction sheet given to you by the facility where your surgery was performed. IF YOU HAVE DISABILITY OR FAMILY LEAVE FORMS, YOU MUST BRING THEM TO THE OFFICE FOR PROCESSING.   DO NOT GIVE THEM TO YOUR DOCTOR.  A prescription for pain medication may be given to you upon discharge.  Take your pain medication as prescribed, if needed.  If narcotic pain medicine is not needed, then you may take acetaminophen (Tylenol) or ibuprofen (Advil) as needed. Take your usually prescribed medications unless otherwise directed. If you need a refill on  your pain medication, please contact your pharmacy.  They will contact our office to request authorization. Prescriptions will not be filled after 5pm or on week-ends. You should follow a light diet the first few days after arrival home, such as soup and crackers, etc.  Be sure to include lots of fluids daily. Most patients will experience some swelling and bruising in the area of the incisions.  Ice packs will help.  Swelling and bruising can take several days to resolve.  It is common to experience some constipation if taking pain medication after surgery.  Increasing fluid intake and taking a stool softener (such as Colace) will usually help or prevent this problem from occurring.  A mild laxative (Milk of Magnesia or Miralax) should be taken according to package instructions if there are no bowel movements after 48 hours. Unless discharge instructions indicate otherwise, you may remove your bandages 24-48 hours after surgery, and you may shower at that time.  You may have steri-strips (small skin tapes) in place directly over the incision.  These strips should be left on the skin for 7-10 days.  If your surgeon used skin glue on the incision, you may shower in 24 hours.  The glue will flake off over the next 2-3 weeks.  Any sutures or staples will be removed at the office during your follow-up visit. ACTIVITIES:  You may resume regular (light) daily activities beginning the next day-such as daily self-care, walking, climbing stairs-gradually increasing activities as tolerated.  You may have sexual intercourse when it is comfortable.  Refrain from any heavy lifting or straining  until approved by your doctor. You may drive when you are no longer taking prescription pain medication, you can comfortably wear a seatbelt, and you can safely maneuver your car and apply brakes. RETURN TO WORK:  __________________________________________________________ Bonita Quin should see your doctor in the office for a follow-up  appointment approximately 2-3 weeks after your surgery.  Make sure that you call for this appointment within a day or two after you arrive home to insure a convenient appointment time. OTHER INSTRUCTIONS: __________________________________________________________________________________________________________________________ __________________________________________________________________________________________________________________________ WHEN TO CALL YOUR DOCTOR: Fever over 101.0 Inability to urinate Continued bleeding from incision. Increased pain, redness, or drainage from the incision. Increasing abdominal pain  The clinic staff is available to answer your questions during regular business hours.  Please don't hesitate to call and ask to speak to one of the nurses for clinical concerns.  If you have a medical emergency, go to the nearest emergency room or call 911.  A surgeon from [facility] is always on call at the hospital. [address], [address], [place], [place]  [address] ? [address] [address], [place], [place]   [address] [phone] ? [phone] ? FAX [phone] Web site: [email or web address]   Increase activity slowly    Complete by:  As directed        Medication List    STOP taking these medications   ondansetron 4 MG tablet Commonly known as:  ZOFRAN   oxyCODONE-acetaminophen 5-325 MG tablet Commonly known as:  PERCOCET/ROXICET     TAKE these medications   aerochamber plus with mask inhaler Use as instructed   albuterol 108 (90 Base) MCG/ACT inhaler Commonly known as:  PROVENTIL HFA;VENTOLIN HFA Inhale 2 puffs into the lungs every 4 (four) hours as needed for wheezing or shortness of breath.   famotidine 20 MG tablet Commonly known as:  PEPCID Take 20 mg by mouth daily as needed for heartburn.   FLUoxetine 20 MG tablet Commonly known as:  PROZAC Take 1 tablet (20 mg total) by mouth daily.   hydrochlorothiazide 25 MG  tablet Commonly known as:  HYDRODIURIL Take 1 tablet (25 mg total) by mouth daily.   lisinopril 10 MG tablet Commonly known as:  PRINIVIL,ZESTRIL Take 1 tablet (10 mg total) by mouth daily.   metFORMIN 500 MG tablet Commonly known as:  GLUCOPHAGE Take 1 tablet (500 mg total) by mouth 2 (two) times daily with a meal.   oxyCODONE 5 MG immediate release tablet Commonly known as:  Oxy IR/ROXICODONE Take 1-2 tablets (5-10 mg total) by mouth every 4 (four) hours as needed for moderate pain or severe pain.   rivaroxaban 20 MG Tabs tablet Commonly known as:  XARELTO Take 1 tablet (20 mg total) by mouth daily with supper.        SignedLiz Malady 06/08/2016, 7:40 AM

## 2016-06-10 ENCOUNTER — Emergency Department (HOSPITAL_COMMUNITY)
Admission: EM | Admit: 2016-06-10 | Discharge: 2016-06-10 | Disposition: A | Discharge status: Home / Self Care | Payer: No Typology Code available for payment source | Attending: Emergency Medicine | Admitting: Emergency Medicine

## 2016-06-10 ENCOUNTER — Encounter (HOSPITAL_COMMUNITY): Payer: Self-pay

## 2016-06-10 DIAGNOSIS — E119 Type 2 diabetes mellitus without complications: Secondary | ICD-10-CM | POA: Insufficient documentation

## 2016-06-10 DIAGNOSIS — T8131XA Disruption of external operation (surgical) wound, not elsewhere classified, initial encounter: Secondary | ICD-10-CM | POA: Insufficient documentation

## 2016-06-10 DIAGNOSIS — I10 Essential (primary) hypertension: Secondary | ICD-10-CM | POA: Insufficient documentation

## 2016-06-10 DIAGNOSIS — Y829 Unspecified medical devices associated with adverse incidents: Secondary | ICD-10-CM | POA: Insufficient documentation

## 2016-06-10 DIAGNOSIS — Z7984 Long term (current) use of oral hypoglycemic drugs: Secondary | ICD-10-CM | POA: Insufficient documentation

## 2016-06-10 DIAGNOSIS — Z7901 Long term (current) use of anticoagulants: Secondary | ICD-10-CM | POA: Insufficient documentation

## 2016-06-10 DIAGNOSIS — Z79899 Other long term (current) drug therapy: Secondary | ICD-10-CM | POA: Insufficient documentation

## 2016-06-10 LAB — CBG MONITORING, ED: GLUCOSE-CAPILLARY: 110 mg/dL — AB (ref 65–99)

## 2016-06-10 MED ORDER — BACITRACIN ZINC 500 UNIT/GM EX OINT
1.0000 "application " | TOPICAL_OINTMENT | Freq: Two times a day (BID) | CUTANEOUS | Status: DC
  Filled 2016-06-10: qty 0.9

## 2016-06-10 MED ORDER — OXYCODONE HCL 5 MG PO TABS
5.0000 mg | ORAL_TABLET | Freq: Once | ORAL | Status: DC
  Filled 2016-06-10: qty 1

## 2016-06-10 NOTE — ED Notes (Signed)
Pt given sprite zero at RN's request.

## 2016-06-10 NOTE — ED Triage Notes (Signed)
Pt presents with a ruptured incision to umbilicus since this morning, post cholecystectomy on Thursday. No active bleeding but covered with dry gauze.

## 2016-06-10 NOTE — ED Notes (Signed)
The pt had gb surgery this past Thursday  His umbilical incision has opened  Since yesterday  He has been coughing  He did not call his surgeons.

## 2016-06-10 NOTE — ED Provider Notes (Signed)
[facility]-EMERGENCY DEPT Provider Note   CSN: [id number] Arrival date & time: 06/10/16  1147     History   Chief Complaint Chief Complaint  Patient presents with  . Post-op Problem    HPI David Dunlap is a 45 y.o. male  who presents emergency with chief complaint of wound dehiscence. He is status post laparoscopic cholecystectomy on 921. Patient states that he thinks that he has sometime in the middle of the night because when he woke up there was a large wound at the infraumbilical incision site with bleeding. Bleeding has since resolved. He has had gauze coverage every today. He denies any severe pain in his abdomen.  HPI  Past Medical History:  Diagnosis Date  . Diabetes mellitus without complication (HCC) 05/2016   NEW ONSET    WITH FAMILY HISTORY  . DVT (deep venous thrombosis) (HCC)   . Essential hypertension   . Gallstones 05/2016    Patient Active Problem List   Diagnosis Date Noted  . Acute cholecystitis 06/05/2016  . Cholelithiasis 04/09/2016  . Major depressive disorder, single episode 04/09/2016  . Leg DVT (deep venous thromboembolism), chronic (HCC) 12/08/2014  . Family history of diabetes mellitus (DM) 12/08/2014  . DVT, femoral, acute (HCC) 03/22/2014  . Moderate major depression, single episode (HCC) 08/22/2011  . Constipation 11/22/2010  . ANKLE EDEMA 10/19/2010  . Essential hypertension 10/12/2010    Past Surgical History:  Procedure Laterality Date  . CHOLECYSTECTOMY N/A 06/07/2016   Procedure: LAPAROSCOPIC CHOLECYSTECTOMY WITH ATTEMPTED INTRAOPERATIVE CHOLANGIOGRAM;  Surgeon: Violeta GelinasBurke Thompson, MD;  Location: [facility] OR;  Service: General;  Laterality: N/A;  . LIGAMENT REPAIR     R forearm       Home Medications    Prior to Admission medications   Medication Sig Start Date End Date Taking? Authorizing Provider  albuterol (PROVENTIL HFA;VENTOLIN HFA) 108 (90 Base) MCG/ACT inhaler Inhale 2 puffs into the lungs every 4 (four) hours as needed for  wheezing or shortness of breath. 10/11/15  Yes Arby BarretteMarcy Pfeiffer, MD  famotidine (PEPCID) 20 MG tablet Take 20 mg by mouth daily as needed for heartburn.  05/22/16  Yes Historical Provider, MD  FLUoxetine (PROZAC) 20 MG tablet Take 1 tablet (20 mg total) by mouth daily. 04/09/16  Yes Jaclyn ShaggyEnobong Amao, MD  hydrochlorothiazide (HYDRODIURIL) 25 MG tablet Take 1 tablet (25 mg total) by mouth daily. 06/01/16  Yes Marzella SchleinAngela M McClung, PA-C  lisinopril (PRINIVIL,ZESTRIL) 10 MG tablet Take 1 tablet (10 mg total) by mouth daily. 06/01/16  Yes Anders SimmondsAngela M McClung, PA-C  metFORMIN (GLUCOPHAGE) 500 MG tablet Take 1 tablet (500 mg total) by mouth 2 (two) times daily with a meal. 06/01/16  Yes Anders SimmondsAngela M McClung, PA-C  oxyCODONE (OXY IR/ROXICODONE) 5 MG immediate release tablet Take 1-2 tablets (5-10 mg total) by mouth every 4 (four) hours as needed for moderate pain or severe pain. Patient taking differently: Take 5 mg by mouth every 4 (four) hours as needed for moderate pain or severe pain.  06/08/16  Yes Violeta GelinasBurke Thompson, MD  rivaroxaban (XARELTO) 20 MG TABS tablet Take 1 tablet (20 mg total) by mouth daily with supper. 12/27/14  Yes Doris Cheadleeepak Advani, MD  Spacer/Aero-Holding Chambers (AEROCHAMBER PLUS WITH MASK) inhaler Use as instructed Patient not taking: Reported on 06/10/2016 10/11/15   Arby BarretteMarcy Pfeiffer, MD    Family History Family History  Problem Relation Age of Onset  . Diabetes Mother   . Diabetes Father   . Hypertension Sister     Social History Social  History  Substance Use Topics  . Smoking status: Never Smoker  . Smokeless tobacco: Never Used  . Alcohol use No     Allergies   No known allergies   Review of Systems Review of Systems Ten systems are reviewed and are negative for acute change except as noted in the HPI   Physical Exam Updated Vital Signs BP 128/93   Pulse 64   Temp 98 F (36.7 C) (Oral)   Resp 17   SpO2 98%   Physical Exam  Constitutional: He appears well-developed and well-nourished.  No distress.  HENT:  Head: Normocephalic and atraumatic.  Eyes: Conjunctivae are normal. No scleral icterus.  Neck: Normal range of motion. Neck supple.  Cardiovascular: Normal rate, regular rhythm and normal heart sounds.   Pulmonary/Chest: Effort normal and breath sounds normal. No respiratory distress.  Abdominal: Soft. He exhibits no distension. There is no tenderness.  Wound dehiscence of the infraumbilical trocar site Appears supperficial  Musculoskeletal: He exhibits no edema.  Neurological: He is alert.  Skin: Skin is warm and dry. He is not diaphoretic.  Psychiatric: His behavior is normal.  Nursing note and vitals reviewed.      ED Treatments / Results  Labs (all labs ordered are listed, but only abnormal results are displayed) Labs Reviewed - No data to display  EKG  EKG Interpretation None       Radiology No results found.  Procedures Procedures (including critical care time)  Medications Ordered in ED Medications - No data to display   Initial Impression / Assessment and Plan / ED Course  I have reviewed the triage vital signs and the nursing notes.  Pertinent labs & imaging results that were available during my care of the patient were reviewed by me and considered in my medical decision making (see chart for details).  Clinical Course  Patient with infraumbilical incision dehiscence. I discussed with Dr. Magnus Ivan who is on call for [facility]. He states that he should be cleansed. Bacitracin applied and sterile dressing. Patient is to call [facility] first thing tomorrow morning to get an office appointment. No signs of infection, no active bleeding. Patient appears safe for discharge at this time.  Final Clinical Impressions(s) / ED Diagnoses   Final diagnoses:  None    New Prescriptions New Prescriptions   No medications on file     Arthor Captain, PA-C 06/11/16 6962    Lorre Nick, MD 06/18/16 1910

## 2016-06-10 NOTE — Discharge Instructions (Signed)
Clean the wound and change the bandage once a day with antibacterial soap and water. Apply bacitracin. Call the doctor tomorrow

## 2016-06-10 NOTE — ED Notes (Signed)
CBG resulted: 110. RN notified.

## 2016-06-10 NOTE — ED Notes (Signed)
Food given requesting pain med  Tylenol advil offered   Since the pt is riding the buis home per pa

## 2016-07-05 ENCOUNTER — Ambulatory Visit: Payer: No Typology Code available for payment source | Admitting: Family Medicine

## 2016-07-09 MED FILL — HYDROCHLOROTHIAZIDE 25 MG T: 25 | 30 days supply | Qty: 30 | Fill #0

## 2016-07-09 MED FILL — LISINOPRIL 10 MG TABLET: 10 | 30 days supply | Qty: 30 | Fill #0

## 2016-07-09 MED FILL — metFORMIN HCL 500 MG TABS: 500 | 30 days supply | Qty: 60 | Fill #1

## 2016-07-25 ENCOUNTER — Ambulatory Visit: Payer: Self-pay | Attending: Family Medicine | Admitting: Family Medicine

## 2016-07-25 ENCOUNTER — Encounter: Payer: Self-pay | Admitting: Family Medicine

## 2016-07-25 VITALS — BP 116/75 | HR 98 | Temp 98.3°F | Ht 69.0 in | Wt 284.8 lb

## 2016-07-25 DIAGNOSIS — E1140 Type 2 diabetes mellitus with diabetic neuropathy, unspecified: Secondary | ICD-10-CM | POA: Insufficient documentation

## 2016-07-25 DIAGNOSIS — G629 Polyneuropathy, unspecified: Secondary | ICD-10-CM

## 2016-07-25 DIAGNOSIS — E0800 Diabetes mellitus due to underlying condition with hyperosmolarity without nonketotic hyperglycemic-hyperosmolar coma (NKHHC): Secondary | ICD-10-CM

## 2016-07-25 DIAGNOSIS — Z86718 Personal history of other venous thrombosis and embolism: Secondary | ICD-10-CM | POA: Insufficient documentation

## 2016-07-25 DIAGNOSIS — I82503 Chronic embolism and thrombosis of unspecified deep veins of lower extremity, bilateral: Secondary | ICD-10-CM

## 2016-07-25 DIAGNOSIS — F321 Major depressive disorder, single episode, moderate: Secondary | ICD-10-CM

## 2016-07-25 DIAGNOSIS — Z7901 Long term (current) use of anticoagulants: Secondary | ICD-10-CM | POA: Insufficient documentation

## 2016-07-25 DIAGNOSIS — E669 Obesity, unspecified: Secondary | ICD-10-CM | POA: Insufficient documentation

## 2016-07-25 DIAGNOSIS — R05 Cough: Secondary | ICD-10-CM

## 2016-07-25 DIAGNOSIS — Z79899 Other long term (current) drug therapy: Secondary | ICD-10-CM | POA: Insufficient documentation

## 2016-07-25 DIAGNOSIS — Z6841 Body Mass Index (BMI) 40.0 and over, adult: Secondary | ICD-10-CM | POA: Insufficient documentation

## 2016-07-25 DIAGNOSIS — R059 Cough, unspecified: Secondary | ICD-10-CM

## 2016-07-25 DIAGNOSIS — I10 Essential (primary) hypertension: Secondary | ICD-10-CM

## 2016-07-25 DIAGNOSIS — Z7984 Long term (current) use of oral hypoglycemic drugs: Secondary | ICD-10-CM | POA: Insufficient documentation

## 2016-07-25 LAB — GLUCOSE, POCT (MANUAL RESULT ENTRY): POC Glucose: 192 mg/dl — AB (ref 70–99)

## 2016-07-25 MED ORDER — TRUE METRIX METER DEVI: 1.0000 | Freq: Three times a day (TID) | 0 refills | Status: AC

## 2016-07-25 MED ORDER — TRUEPLUS LANCETS 28G MISC: 12 refills | Status: DC

## 2016-07-25 MED ORDER — GABAPENTIN 300 MG PO CAPS: 300.0000 mg | ORAL_CAPSULE | Freq: Three times a day (TID) | ORAL | 3 refills | Status: DC

## 2016-07-25 MED ORDER — METFORMIN HCL 500 MG PO TABS: 500.0000 mg | ORAL_TABLET | Freq: Two times a day (BID) | ORAL | 1 refills | Status: DC

## 2016-07-25 MED ORDER — FLUOXETINE HCL 20 MG PO TABS: 20.0000 mg | ORAL_TABLET | Freq: Every day | ORAL | 3 refills | Status: DC

## 2016-07-25 MED ORDER — HYDROCHLOROTHIAZIDE 25 MG PO TABS: 25.0000 mg | ORAL_TABLET | Freq: Every day | ORAL | 3 refills | Status: DC

## 2016-07-25 MED ORDER — GLUCOSE BLOOD VI STRP: ORAL_STRIP | 12 refills | Status: DC

## 2016-07-25 MED ORDER — CETIRIZINE HCL 10 MG PO TABS: 10.0000 mg | ORAL_TABLET | Freq: Every day | ORAL | 1 refills | Status: DC

## 2016-07-25 MED ORDER — LISINOPRIL 10 MG PO TABS: 10.0000 mg | ORAL_TABLET | Freq: Every day | ORAL | 3 refills | Status: DC

## 2016-07-25 MED FILL — ?FLUOXETINE HCL 20MG TABLET: 20 | 30 days supply | Qty: 30 | Fill #2

## 2016-07-25 MED FILL — TRUEplus LANCETS 28G MISC: 25 days supply | Qty: 100 | Fill #0

## 2016-07-25 MED FILL — ?GABAPENTIN 300 MG CAPSULE: 300 | 30 days supply | Qty: 90 | Fill #0

## 2016-07-25 MED FILL — TRUE METRIX TEST STRIP: 25 days supply | Qty: 100 | Fill #0

## 2016-07-25 MED FILL — TRUE METRIX BLOOD GLUCOSE M: W/DEVICE | 1 days supply | Qty: 1 | Fill #0

## 2016-07-25 NOTE — Patient Instructions (Signed)
Diabetes Mellitus and Food It is important for you to manage your blood sugar (glucose) level. Your blood glucose level can be greatly affected by what you eat. Eating healthier foods in the appropriate amounts throughout the day at about the same time each day will help you control your blood glucose level. It can also help slow or prevent worsening of your diabetes mellitus. Healthy eating may even help you improve the level of your blood pressure and reach or maintain a healthy weight.  General recommendations for healthful eating and cooking habits include:  Eating meals and snacks regularly. Avoid going long periods of time without eating to lose weight.  Eating a diet that consists mainly of plant-based foods, such as fruits, vegetables, nuts, legumes, and whole grains.  Using low-heat cooking methods, such as baking, instead of high-heat cooking methods, such as deep frying. Work with your dietitian to make sure you understand how to use the Nutrition Facts information on food labels. HOW CAN FOOD AFFECT ME? Carbohydrates Carbohydrates affect your blood glucose level more than any other type of food. Your dietitian will help you determine how many carbohydrates to eat at each meal and teach you how to count carbohydrates. Counting carbohydrates is important to keep your blood glucose at a healthy level, especially if you are using insulin or taking certain medicines for diabetes mellitus. Alcohol Alcohol can cause sudden decreases in blood glucose (hypoglycemia), especially if you use insulin or take certain medicines for diabetes mellitus. Hypoglycemia can be a life-threatening condition. Symptoms of hypoglycemia (sleepiness, dizziness, and disorientation) are similar to symptoms of having too much alcohol.  If your health care provider has given you approval to drink alcohol, do so in moderation and use the following guidelines:  Women should not have more than one drink per day, and men  should not have more than two drinks per day. One drink is equal to:  12 oz of beer.  5 oz of wine.  1 oz of hard liquor.  Do not drink on an empty stomach.  Keep yourself hydrated. Have water, diet soda, or unsweetened iced tea.  Regular soda, juice, and other mixers might contain a lot of carbohydrates and should be counted. WHAT FOODS ARE NOT RECOMMENDED? As you make food choices, it is important to remember that all foods are not the same. Some foods have fewer nutrients per serving than other foods, even though they might have the same number of calories or carbohydrates. It is difficult to get your body what it needs when you eat foods with fewer nutrients. Examples of foods that you should avoid that are high in calories and carbohydrates but low in nutrients include:  Trans fats (most processed foods list trans fats on the Nutrition Facts label).  Regular soda.  Juice.  Candy.  Sweets, such as cake, pie, doughnuts, and cookies.  Fried foods. WHAT FOODS CAN I EAT? Eat nutrient-rich foods, which will nourish your body and keep you healthy. The food you should eat also will depend on several factors, including:  The calories you need.  The medicines you take.  Your weight.  Your blood glucose level.  Your blood pressure level.  Your cholesterol level. You should eat a variety of foods, including:  Protein.  Lean cuts of meat.  Proteins low in saturated fats, such as fish, egg whites, and beans. Avoid processed meats.  Fruits and vegetables.  Fruits and vegetables that may help control blood glucose levels, such as apples, mangoes, and   yams.  Dairy products.  Choose fat-free or low-fat dairy products, such as milk, yogurt, and cheese.  Grains, bread, pasta, and rice.  Choose whole grain products, such as multigrain bread, whole oats, and brown rice. These foods may help control blood pressure.  Fats.  Foods containing healthful fats, such as nuts,  avocado, olive oil, canola oil, and fish. DOES EVERYONE WITH DIABETES MELLITUS HAVE THE SAME MEAL PLAN? Because every person with diabetes mellitus is different, there is not one meal plan that works for everyone. It is very important that you meet with a dietitian who will help you create a meal plan that is just right for you.   This information is not intended to replace advice given to you by your health care provider. Make sure you discuss any questions you have with your health care provider.   Document Released: 05/31/2005 Document Revised: 09/24/2014 Document Reviewed: 07/31/2013 Elsevier Interactive Patient Education 2016 [employer]  

## 2016-07-25 NOTE — Progress Notes (Signed)
Med refills

## 2016-07-26 NOTE — Progress Notes (Signed)
Subjective:  Patient ID: David Dunlap, male    DOB: 1970/11/30  Age: 45 y.o. MRN: [id number]  CC: Hypertension and Diabetes   HPI David Dunlap is a 45 year old male with history of hypertension, newly diagnosed type 2 diabetes mellitus (A1c 6.3), obesity,s/p laparoscopic cholecystectomy a month ago for cholelithiasis who comes in for follow-up visit today.  His last office visit with the PA he was diagnosed with type 2 diabetes and commenced on metformin which he has been compliant with. He does not have a glucometer and testing supplies and denies hypoglycemic symptoms or visual symptoms but endorses some numbness in the soles of his feet. Sometimes has pedal edema and is wondering if he has circulation problems.  Compliant with his antihypertensive and low-sodium diet but does not exercise regularly.  Complains of an occasional cough and postnasal drip but denies sinus congestion.  Past Medical History:  Diagnosis Date  . Diabetes mellitus without complication (HCC) 05/2016   NEW ONSET    WITH FAMILY HISTORY  . DVT (deep venous thrombosis) (HCC)   . Essential hypertension   . Gallstones 05/2016    Past Surgical History:  Procedure Laterality Date  . CHOLECYSTECTOMY N/A 06/07/2016   Procedure: LAPAROSCOPIC CHOLECYSTECTOMY WITH ATTEMPTED INTRAOPERATIVE CHOLANGIOGRAM;  Surgeon: Violeta Gelinas, MD;  Location: [facility] OR;  Service: General;  Laterality: N/A;  . LIGAMENT REPAIR     R forearm    Allergies  Allergen Reactions  . No Known Allergies     Outpatient Medications Prior to Visit  Medication Sig Dispense Refill  . famotidine (PEPCID) 20 MG tablet Take 20 mg by mouth daily as needed for heartburn.   0  . rivaroxaban (XARELTO) 20 MG TABS tablet Take 1 tablet (20 mg total) by mouth daily with supper. 90 tablet 3  . albuterol (PROVENTIL HFA;VENTOLIN HFA) 108 (90 Base) MCG/ACT inhaler Inhale 2 puffs into the lungs every 4 (four) hours as needed for wheezing or  shortness of breath. 1 Inhaler 0  . FLUoxetine (PROZAC) 20 MG tablet Take 1 tablet (20 mg total) by mouth daily. 30 tablet 3  . hydrochlorothiazide (HYDRODIURIL) 25 MG tablet Take 1 tablet (25 mg total) by mouth daily. 30 tablet 3  . lisinopril (PRINIVIL,ZESTRIL) 10 MG tablet Take 1 tablet (10 mg total) by mouth daily. 30 tablet 3  . metFORMIN (GLUCOPHAGE) 500 MG tablet Take 1 tablet (500 mg total) by mouth 2 (two) times daily with a meal. 60 tablet 1  . Spacer/Aero-Holding Chambers (AEROCHAMBER PLUS WITH MASK) inhaler Use as instructed 1 each 2  . oxyCODONE (OXY IR/ROXICODONE) 5 MG immediate release tablet Take 1-2 tablets (5-10 mg total) by mouth every 4 (four) hours as needed for moderate pain or severe pain. (Patient not taking: Reported on 07/25/2016) 30 tablet 0   No facility-administered medications prior to visit.     ROS Review of Systems  Constitutional: Negative for activity change and appetite change.  HENT: Positive for postnasal drip. Negative for sinus pressure and sore throat.   Eyes: Negative for visual disturbance.  Respiratory: Positive for cough. Negative for chest tightness and shortness of breath.   Cardiovascular: Negative for chest pain and leg swelling.  Gastrointestinal: Negative for abdominal distention, abdominal pain, constipation and diarrhea.  Endocrine: Negative.   Genitourinary: Negative for dysuria.  Musculoskeletal: Negative for joint swelling and myalgias.  Skin: Negative for rash.  Allergic/Immunologic: Negative.   Neurological: Positive for numbness. Negative for weakness and light-headedness.  Psychiatric/Behavioral: Negative for dysphoric mood  and suicidal ideas.    Objective:  BP 116/75 (BP Location: Right Arm, Patient Position: Sitting, Cuff Size: Large)   Pulse 98   Temp 98.3 F (36.8 C) (Oral)   Ht 5\' 9"  (1.753 m)   Wt 284 lb 12.8 oz (129.2 kg)   SpO2 98%   BMI 42.06 kg/m   BP/Weight 07/25/2016 06/10/2016 06/08/2016  Systolic BP 116 124  130  Diastolic BP 75 86 80  Wt. (Lbs) 284.8 - -  BMI 42.06 - -      Physical Exam  Constitutional: He is oriented to person, place, and time. He appears well-developed and well-nourished.  HENT:  Right Ear: External ear normal.  Left Ear: External ear normal.  Mouth/Throat: Oropharynx is clear and moist.  Cardiovascular: Normal rate, normal heart sounds and intact distal pulses.   No murmur heard. Pulmonary/Chest: Effort normal and breath sounds normal. He has no wheezes. He has no rales. He exhibits no tenderness.  Abdominal: Soft. Bowel sounds are normal. He exhibits no distension and no mass. There is no tenderness.  Musculoskeletal: Normal range of motion.  Neurological: He is alert and oriented to person, place, and time.  Skin: Skin is warm and dry.  Psychiatric: He has a normal mood and affect.     Lab Results  Component Value Date   HGBA1C 6.3 (H) 06/05/2016     Assessment & Plan:   1. Diabetes mellitus due to underlying condition with hyperosmolarity without coma, without long-term current use of insulin (HCC) Controlled with A1c of 6.3 He probably has Prediabetes as he had A1c of 6.6 and subsequently 6.3 in a space of 4 days Will keep on metformin as I am uncertain if he is motivated enough to control this only through lifestyle management Clinical pharmacist called in for diabetic teaching. - Glucose (CBG) - metFORMIN (GLUCOPHAGE) 500 MG tablet; Take 1 tablet (500 mg total) by mouth 2 (two) times daily with a meal.  Dispense: 60 tablet; Refill: 1 - glucose blood (TRUE METRIX BLOOD GLUCOSE TEST) test strip; Used daily before meals  Dispense: 100 each; Refill: 12 - Blood Glucose Monitoring Suppl (TRUE METRIX METER) DEVI; 1 each by Does not apply route 3 (three) times daily before meals.  Dispense: 1 Device; Refill: 0 - TRUEPLUS LANCETS 28G MISC; Use daily before meals  Dispense: 30 each; Refill: 12  2. Major depressive disorder, single episode, moderate  (HCC) Controlled - FLUoxetine (PROZAC) 20 MG tablet; Take 1 tablet (20 mg total) by mouth daily.  Dispense: 30 tablet; Refill: 3  3. Essential hypertension Uncontrolled - hydrochlorothiazide (HYDRODIURIL) 25 MG tablet; Take 1 tablet (25 mg total) by mouth daily.  Dispense: 30 tablet; Refill: 3 - lisinopril (PRINIVIL,ZESTRIL) 10 MG tablet; Take 1 tablet (10 mg total) by mouth daily.  Dispense: 30 tablet; Refill: 3  4. 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 Neuropathy (HCC) Newly diagnosed-sedative side effects discussed Peripheral vascular disease unlikely as he has strong dorsalis pedis pulses; advised to elevate the leg in the event of edema. - gabapentin (NEURONTIN) 300 MG capsule; Take 1 capsule (300 mg total) by mouth 3 (three) times daily.  Dispense: 90 capsule; Refill: 3  6. Cough Likely from postnasal drip - cetirizine (ZYRTEC) 10 MG tablet; Take 1 tablet (10 mg total) by mouth daily.  Dispense: 30 tablet; Refill: 1   Meds ordered this encounter  Medications  . FLUoxetine (PROZAC) 20 MG tablet  Sig: Take 1 tablet (20 mg total) by mouth daily.    Dispense:  30 tablet    Refill:  3  . hydrochlorothiazide (HYDRODIURIL) 25 MG tablet    Sig: Take 1 tablet (25 mg total) by mouth daily.    Dispense:  30 tablet    Refill:  3  . lisinopril (PRINIVIL,ZESTRIL) 10 MG tablet    Sig: Take 1 tablet (10 mg total) by mouth daily.    Dispense:  30 tablet    Refill:  3  . metFORMIN (GLUCOPHAGE) 500 MG tablet    Sig: Take 1 tablet (500 mg total) by mouth 2 (two) times daily with a meal.    Dispense:  60 tablet    Refill:  1  . glucose blood (TRUE METRIX BLOOD GLUCOSE TEST) test strip    Sig: Used daily before meals    Dispense:  100 each    Refill:  12  . Blood Glucose Monitoring Suppl (TRUE METRIX METER) DEVI    Sig: 1 each by Does not apply route 3 (three) times daily before meals.    Dispense:  1 Device     Refill:  0  . TRUEPLUS LANCETS 28G MISC    Sig: Use daily before meals    Dispense:  30 each    Refill:  12  . gabapentin (NEURONTIN) 300 MG capsule    Sig: Take 1 capsule (300 mg total) by mouth 3 (three) times daily.    Dispense:  90 capsule    Refill:  3  . cetirizine (ZYRTEC) 10 MG tablet    Sig: Take 1 tablet (10 mg total) by mouth daily.    Dispense:  30 tablet    Refill:  1    Follow-up: Return in about 3 months (around 10/25/2016) for Follow-up on DVT, diabetes.   Jaclyn ShaggyEnobong Amao MD

## 2016-08-24 ENCOUNTER — Ambulatory Visit: Payer: Self-pay | Admitting: Family Medicine

## 2016-09-17 ENCOUNTER — Emergency Department (HOSPITAL_COMMUNITY)
Admission: EM | Admit: 2016-09-17 | Discharge: 2016-09-17 | Disposition: A | Discharge status: Home / Self Care | Payer: Self-pay | Attending: Emergency Medicine | Admitting: Emergency Medicine

## 2016-09-17 ENCOUNTER — Encounter (HOSPITAL_COMMUNITY): Payer: Self-pay | Admitting: Emergency Medicine

## 2016-09-17 DIAGNOSIS — J3489 Other specified disorders of nose and nasal sinuses: Secondary | ICD-10-CM

## 2016-09-17 DIAGNOSIS — Z7901 Long term (current) use of anticoagulants: Secondary | ICD-10-CM | POA: Insufficient documentation

## 2016-09-17 DIAGNOSIS — Z7984 Long term (current) use of oral hypoglycemic drugs: Secondary | ICD-10-CM | POA: Insufficient documentation

## 2016-09-17 DIAGNOSIS — E1140 Type 2 diabetes mellitus with diabetic neuropathy, unspecified: Secondary | ICD-10-CM | POA: Insufficient documentation

## 2016-09-17 DIAGNOSIS — I10 Essential (primary) hypertension: Secondary | ICD-10-CM | POA: Insufficient documentation

## 2016-09-17 DIAGNOSIS — Z79899 Other long term (current) drug therapy: Secondary | ICD-10-CM | POA: Insufficient documentation

## 2016-09-17 DIAGNOSIS — H1031 Unspecified acute conjunctivitis, right eye: Secondary | ICD-10-CM

## 2016-09-17 HISTORY — DX: Major depressive disorder, single episode, unspecified: F32.9

## 2016-09-17 HISTORY — DX: Depression, unspecified: F32.A

## 2016-09-17 MED ORDER — ERYTHROMYCIN 5 MG/GM OP OINT: 1.0000 "application " | TOPICAL_OINTMENT | Freq: Four times a day (QID) | OPHTHALMIC | 0 refills | Status: AC

## 2016-09-17 MED ORDER — FLUORESCEIN SODIUM 0.6 MG OP STRP
1.0000 | ORAL_STRIP | Freq: Once | OPHTHALMIC | Status: AC
  Administered 2016-09-17: 1 via OPHTHALMIC
  Filled 2016-09-17: qty 1

## 2016-09-17 MED ORDER — TETRACAINE HCL 0.5 % OP SOLN
2.0000 [drp] | Freq: Once | OPHTHALMIC | Status: AC
  Administered 2016-09-17: 2 [drp] via OPHTHALMIC
  Filled 2016-09-17: qty 2

## 2016-09-17 NOTE — ED Notes (Addendum)
visual acuity: Right eye=20/30 Left eye=20/50 Both eyes=20/20

## 2016-09-17 NOTE — Discharge Instructions (Signed)
Conjunctivitis is very contagious. Try to avoid rubbing eyes. Apply warm or cool compresses and use prescribed eye ointments as directed. Use tylenol and motrin as needed for pain relief. May consider using over the counter antihistamines like claritin or zyrtec for symptom relief. Follow up with the ophthalmologist in 1-2 days for recheck of symptoms. Return to the ER for changes or worsening symptoms.

## 2016-09-17 NOTE — ED Triage Notes (Signed)
Woke this morning with right eye redness, swelling aned sensitivity to light.   Rhinorrhea noted.

## 2016-09-17 NOTE — ED Provider Notes (Signed)
[facility]-EMERGENCY DEPT Provider Note   CSN: [id number] Arrival date & time: 09/17/16  1152    By signing my name below, I, David Dunlap, attest that this documentation has been prepared under the direction and in the presence of Advait Buice Camprubi-Soms, PA-C. Electronically Signed: Valentino Dunlap, ED Scribe. 09/17/16. 12:38 PM.  History   Chief Complaint Chief Complaint  Patient presents with  . Eye Pain   The history is provided by the patient, the spouse and medical records. No language interpreter was used.  Eye Pain  This is a new problem. The current episode started 6 to 12 hours ago. The problem occurs constantly. The problem has not changed since onset.Pertinent negatives include no chest pain, no abdominal pain and no shortness of breath. Nothing aggravates the symptoms. Nothing relieves the symptoms. Treatments tried: allergy medicine. The treatment provided no relief.   HPI Comments: David Dunlap is a 46 y.o. male with PMHx of DM2 and HTN who presents to the Emergency Department complaining of 9/10, moderate, constant, nonradiating FB sensation/gritty-type right eye pain onset this morning after awakening; no known aggravating or alleviating factors, unrelieved by OTC allergy medication taken earlier. He reports associated eye redness, swelling, watery/tearing, and photophobia. Pt states he was scratching his eye because it itched, just prior to onset of right eye pain. Denies any FBs in his eye, just states it "feels like it". Denies recent trauma or injury. He denies the use of prescription eye wear or contact lenses. He also reports clear rhinnorhea that began this morning as well. Denies ocular drainage/crusting, blurred vision, loss of vision, diplopia, fever, chills, ear pain/drainage, sore throat, cough, CP, SOB, abd pain, n/v/d/c, dysuria, hematuria, numbness, tingling, weakness, myalgias, arthralgias, rashes, or any other complaints. Pt denies recent sick contact.  Denies swimming recently.  Past Medical History:  Diagnosis Date  . Depression   . Diabetes mellitus without complication (HCC) 05/2016   NEW ONSET    WITH FAMILY HISTORY  . DVT (deep venous thrombosis) (HCC)   . Essential hypertension   . Gallstones 05/2016    Patient Active Problem List   Diagnosis Date Noted  . Neuropathy (HCC) 07/25/2016  . Acute cholecystitis 06/05/2016  . Cholelithiasis 04/09/2016  . Major depressive disorder, single episode 04/09/2016  . Leg DVT (deep venous thromboembolism), chronic (HCC) 12/08/2014  . Family history of diabetes mellitus (DM) 12/08/2014  . DVT, femoral, acute (HCC) 03/22/2014  . Moderate major depression, single episode (HCC) 08/22/2011  . Constipation 11/22/2010  . ANKLE EDEMA 10/19/2010  . Essential hypertension 10/12/2010    Past Surgical History:  Procedure Laterality Date  . CHOLECYSTECTOMY N/A 06/07/2016   Procedure: LAPAROSCOPIC CHOLECYSTECTOMY WITH ATTEMPTED INTRAOPERATIVE CHOLANGIOGRAM;  Surgeon: Violeta Gelinas, MD;  Location: [facility] OR;  Service: General;  Laterality: N/A;  . LIGAMENT REPAIR     R forearm       Home Medications    Prior to Admission medications   Medication Sig Start Date End Date Taking? Authorizing Provider  Blood Glucose Monitoring Suppl (TRUE METRIX METER) DEVI 1 each by Does not apply route 3 (three) times daily before meals. 07/25/16   Jaclyn Shaggy, MD  cetirizine (ZYRTEC) 10 MG tablet Take 1 tablet (10 mg total) by mouth daily. 07/25/16   Jaclyn Shaggy, MD  famotidine (PEPCID) 20 MG tablet Take 20 mg by mouth daily as needed for heartburn.  05/22/16   Historical Provider, MD  FLUoxetine (PROZAC) 20 MG tablet Take 1 tablet (20 mg total) by mouth daily. 07/25/16  Jaclyn ShaggyEnobong Amao, MD  gabapentin (NEURONTIN) 300 MG capsule Take 1 capsule (300 mg total) by mouth 3 (three) times daily. 07/25/16   Jaclyn ShaggyEnobong Amao, MD  glucose blood (TRUE METRIX BLOOD GLUCOSE TEST) test strip Used daily before meals 07/25/16   Jaclyn ShaggyEnobong  Amao, MD  hydrochlorothiazide (HYDRODIURIL) 25 MG tablet Take 1 tablet (25 mg total) by mouth daily. 07/25/16   Jaclyn ShaggyEnobong Amao, MD  lisinopril (PRINIVIL,ZESTRIL) 10 MG tablet Take 1 tablet (10 mg total) by mouth daily. 07/25/16   Jaclyn ShaggyEnobong Amao, MD  metFORMIN (GLUCOPHAGE) 500 MG tablet Take 1 tablet (500 mg total) by mouth 2 (two) times daily with a meal. 07/25/16   Jaclyn ShaggyEnobong Amao, MD  rivaroxaban (XARELTO) 20 MG TABS tablet Take 1 tablet (20 mg total) by mouth daily with supper. 12/27/14   Doris Cheadleeepak Advani, MD  TRUEPLUS LANCETS 28G MISC Use daily before meals 07/25/16   Jaclyn ShaggyEnobong Amao, MD    Family History Family History  Problem Relation Age of Onset  . Diabetes Mother   . Diabetes Father   . Hypertension Sister     Social History Social History  Substance Use Topics  . Smoking status: Never Smoker  . Smokeless tobacco: Never Used  . Alcohol use No     Allergies   No known allergies   Review of Systems Review of Systems  Constitutional: Negative for chills and fever.  HENT: Positive for rhinorrhea. Negative for ear discharge, ear pain and sore throat.   Eyes: Positive for photophobia, pain (FB sensation), redness and itching. Negative for discharge and visual disturbance.  Respiratory: Negative for cough and shortness of breath.   Cardiovascular: Negative for chest pain.  Gastrointestinal: Negative for abdominal pain, constipation, diarrhea, nausea and vomiting.  Genitourinary: Negative for dysuria and hematuria.  Musculoskeletal: Negative for arthralgias and myalgias.  Skin: Negative for color change and rash.  Allergic/Immunologic: Positive for immunocompromised state (DM2).  Neurological: Negative for weakness and numbness.  Psychiatric/Behavioral: Negative for confusion.  10 Systems reviewed and all are negative for acute change except as noted in the HPI.    Physical Exam Updated Vital Signs BP 141/83 (BP Location: Right Arm)   Pulse 87   Temp 98 F (36.7 C) (Oral)   Resp  18   Ht 5\' 9"  (1.753 m)   Wt 275 lb (124.7 kg)   SpO2 97%   BMI 40.61 kg/m   Physical Exam  Constitutional: He is oriented to person, place, and time. Vital signs are normal. He appears well-developed and well-nourished.  Non-toxic appearance. No distress.  Afebrile, nontoxic, NAD  HENT:  Head: Normocephalic and atraumatic.  Nose: Mucosal edema and rhinorrhea present.  Mouth/Throat: Uvula is midline, oropharynx is clear and moist and mucous membranes are normal. No trismus in the jaw. No uvula swelling. Tonsils are 0 on the right. Tonsils are 0 on the left. No tonsillar exudate.  Nose with mild mucosal edema and rhinorrhea. Oropharynx clear and moist, without uvular swelling or deviation, no trismus or drooling, no tonsillar swelling or erythema, no exudates.   Eyes: EOM are normal. Pupils are equal, round, and reactive to light. Lids are everted and swept, no foreign bodies found. Right eye exhibits no discharge. Left eye exhibits no discharge. Right conjunctiva is injected. Right conjunctiva has no hemorrhage. Left conjunctiva is not injected. Left conjunctiva has no hemorrhage.  Slit lamp exam:      The right eye shows no corneal abrasion, no corneal ulcer, no hyphema, no hypopyon and no fluorescein uptake.  PERRL, EOMI, no nystagmus, no visual field deficits. Lids everted and swept and no FBs noted, right conjunctiva mildly injected without hemorrhages, left eye conjunctiva clear. No periorbital swelling or erythema. No ocular drainage. No consensual eye pain. Fluorescein exam without ulcerations or uptake, no abrasions, no hyphema or hypopyon noted on slit lamp exam. Visual acuity (distance): 20/50 L eye, 20/30 R eye, 20/20 bilateral. Tono pen .  Neck: Normal range of motion. Neck supple.  Cardiovascular: Normal rate and intact distal pulses.   Pulmonary/Chest: Effort normal. No respiratory distress.  Abdominal: Normal appearance. He exhibits no distension.  Musculoskeletal: Normal  range of motion.  Neurological: He is alert and oriented to person, place, and time. He has normal strength. No sensory deficit.  Skin: Skin is warm, dry and intact. No rash noted.  Psychiatric: He has a normal mood and affect.  Nursing note and vitals reviewed.    ED Treatments / Results   DIAGNOSTIC STUDIES: Oxygen Saturation is 97% on RA, normal by my interpretation.    COORDINATION OF CARE: 12:38 PM Discussed treatment plan with pt at bedside and pt agreed to plan.   Labs (all labs ordered are listed, but only abnormal results are displayed) Labs Reviewed - No data to display  EKG  EKG Interpretation None       Radiology No results found.  Procedures Procedures (including critical care time)  Medications Ordered in ED Medications  fluorescein ophthalmic strip 1 strip (1 strip Right Eye Given 09/17/16 1241)  tetracaine (PONTOCAINE) 0.5 % ophthalmic solution 2 drop (2 drops Right Eye Given 09/17/16 1241)     Initial Impression / Assessment and Plan / ED Course  I have reviewed the triage vital signs and the nursing notes.  Pertinent labs & imaging results that were available during my care of the patient were reviewed by me and considered in my medical decision making (see chart for details).  Clinical Course     46 y.o. male here with R eye pain/irritation, conjunctival injection, and rhinorrhea onset today. No drainage, no FBs, doesn't wear contact lenses. No sick contacts. PERRL and EOMI. Fluorescein stain without ulcerations/abrasions or uptake, tonopen , visual acuity preserved, no consensual eye pain. Likely viral vs allergic conjunctivitis, but given that bacterial conjunctivitis can't be fully ruled out then will treat with erythromycin ointment. Discussed tylenol/motrin/cool compresses for pain, and f/up with ophthalmology in 1-2 days for recheck. Doubt glaucoma or iritis, doubt other emergent etiologies. I explained the diagnosis and have given explicit  precautions to return to the ER including for any other new or worsening symptoms. The patient understands and accepts the medical plan as it's been dictated and I have answered their questions. Discharge instructions concerning home care and prescriptions have been given. The patient is STABLE and is discharged to home in good condition.   I personally performed the services described in this documentation, which was scribed in my presence. The recorded information has been reviewed and is accurate.   Final Clinical Impressions(s) / ED Diagnoses   Final diagnoses:  Acute conjunctivitis of right eye, unspecified acute conjunctivitis type  Rhinorrhea    New Prescriptions New Prescriptions   ERYTHROMYCIN OPHTHALMIC OINTMENT    Place 1 application into the right eye 4 (four) times daily. Apply thin 1/2 inch ribbon to lower eyelid every 6 hours x 7 days     Levi Strauss, PA-C 09/17/16 1301    Donnetta Hutching, MD 09/19/16 [phone]

## 2016-10-15 ENCOUNTER — Ambulatory Visit: Payer: Self-pay | Attending: Family Medicine | Admitting: Physician Assistant

## 2016-10-15 ENCOUNTER — Encounter: Payer: Self-pay | Admitting: Physician Assistant

## 2016-10-15 VITALS — BP 130/86 | HR 87 | Temp 98.8°F | Resp 20 | Wt 289.0 lb

## 2016-10-15 DIAGNOSIS — F329 Major depressive disorder, single episode, unspecified: Secondary | ICD-10-CM | POA: Insufficient documentation

## 2016-10-15 DIAGNOSIS — Z79899 Other long term (current) drug therapy: Secondary | ICD-10-CM | POA: Insufficient documentation

## 2016-10-15 DIAGNOSIS — Z7984 Long term (current) use of oral hypoglycemic drugs: Secondary | ICD-10-CM | POA: Insufficient documentation

## 2016-10-15 DIAGNOSIS — E669 Obesity, unspecified: Secondary | ICD-10-CM | POA: Insufficient documentation

## 2016-10-15 DIAGNOSIS — I10 Essential (primary) hypertension: Secondary | ICD-10-CM

## 2016-10-15 DIAGNOSIS — M25562 Pain in left knee: Secondary | ICD-10-CM | POA: Insufficient documentation

## 2016-10-15 DIAGNOSIS — Z86718 Personal history of other venous thrombosis and embolism: Secondary | ICD-10-CM | POA: Insufficient documentation

## 2016-10-15 DIAGNOSIS — Z760 Encounter for issue of repeat prescription: Secondary | ICD-10-CM

## 2016-10-15 DIAGNOSIS — Z7901 Long term (current) use of anticoagulants: Secondary | ICD-10-CM | POA: Insufficient documentation

## 2016-10-15 DIAGNOSIS — M25561 Pain in right knee: Secondary | ICD-10-CM | POA: Insufficient documentation

## 2016-10-15 DIAGNOSIS — G8929 Other chronic pain: Secondary | ICD-10-CM

## 2016-10-15 DIAGNOSIS — E119 Type 2 diabetes mellitus without complications: Secondary | ICD-10-CM

## 2016-10-15 DIAGNOSIS — Z6841 Body Mass Index (BMI) 40.0 and over, adult: Secondary | ICD-10-CM | POA: Insufficient documentation

## 2016-10-15 LAB — GLUCOSE, POCT (MANUAL RESULT ENTRY): POC GLUCOSE: 174 mg/dL — AB (ref 70–99)

## 2016-10-15 MED ORDER — LISINOPRIL 10 MG PO TABS: 10.0000 mg | ORAL_TABLET | Freq: Every day | ORAL | 3 refills | Status: DC

## 2016-10-15 MED ORDER — METFORMIN HCL 500 MG PO TABS: 500.0000 mg | ORAL_TABLET | Freq: Two times a day (BID) | ORAL | 1 refills | Status: DC

## 2016-10-15 MED ORDER — ACETAMINOPHEN 500 MG PO TABS: 1000.0000 mg | ORAL_TABLET | Freq: Three times a day (TID) | ORAL | 0 refills | Status: AC | PRN

## 2016-10-15 MED ORDER — FAMOTIDINE 20 MG PO TABS: 20.0000 mg | ORAL_TABLET | Freq: Every day | ORAL | 2 refills | Status: DC | PRN

## 2016-10-15 MED ORDER — FLUOXETINE HCL 20 MG PO TABS: 20.0000 mg | ORAL_TABLET | Freq: Every day | ORAL | 3 refills | Status: DC

## 2016-10-15 MED ORDER — RIVAROXABAN 20 MG PO TABS: 20.0000 mg | ORAL_TABLET | Freq: Every day | ORAL | 3 refills | Status: DC

## 2016-10-15 MED ORDER — HYDROCHLOROTHIAZIDE 25 MG PO TABS: 25.0000 mg | ORAL_TABLET | Freq: Every day | ORAL | 3 refills | Status: DC

## 2016-10-15 MED ORDER — TRAMADOL HCL 50 MG PO TABS: 50.0000 mg | ORAL_TABLET | Freq: Two times a day (BID) | ORAL | 0 refills | Status: AC | PRN

## 2016-10-15 MED ORDER — GABAPENTIN 300 MG PO CAPS: 300.0000 mg | ORAL_CAPSULE | Freq: Three times a day (TID) | ORAL | 3 refills | Status: DC

## 2016-10-15 NOTE — Progress Notes (Signed)
Needs RF on all medications

## 2016-10-15 NOTE — Progress Notes (Signed)
Patient ID: David Dunlap, male   DOB: 21-Jul-1971, 46 y.o.   MRN: [id number]    Subjective:  Patient ID: David Dunlap, male    DOB: 21-Jul-1971  Age: 46 y.o. MRN: [id number]  CC: Pain in the knee bilaterally  HPI David Dunlap is a 46 y.o. male with a PMH of   Past Medical History:  Diagnosis Date  . Depression   . Diabetes mellitus without complication (HCC) 05/2016   NEW ONSET    WITH FAMILY HISTORY  . DVT (deep venous thrombosis) (HCC)   . Essential hypertension   . Gallstones 05/2016    that presents with a one year history of generalized knee pain bilaterally. Is unable to pinpoint the pain on either knee. Worse with standing for periods of about two hours. 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 Does not carry more than 20 lbs at his job. Reports generalized swelling of the LE bilaterally after his work shifts. Has not taken anything for relief besides the occasional tylenol. Denies trauma, fall, erythema, ecchymosis, skin texture changes, limited ROM, tingling, numbness. Denies chest pain, SOB, headache, abdominal pain, rash, GI/GU sxs.  Outpatient Medications Prior to Visit  Medication Sig Dispense Refill  . Blood Glucose Monitoring Suppl (TRUE METRIX METER) DEVI 1 each by Does not apply route 3 (three) times daily before meals. 1 Device 0  . glucose blood (TRUE METRIX BLOOD GLUCOSE TEST) test strip Used daily before meals 100 each 12  . TRUEPLUS LANCETS 28G MISC Use daily before meals 30 each 12  . famotidine (PEPCID) 20 MG tablet Take 20 mg by mouth daily as needed for heartburn.   0  . FLUoxetine (PROZAC) 20 MG tablet Take 1 tablet (20 mg total) by mouth daily. 30 tablet 3  . gabapentin (NEURONTIN) 300 MG capsule Take 1 capsule (300 mg total) by mouth 3 (three) times daily. 90 capsule 3  . hydrochlorothiazide (HYDRODIURIL) 25 MG tablet Take 1 tablet (25 mg total) by mouth daily. 30 tablet 3  . lisinopril (PRINIVIL,ZESTRIL) 10 MG tablet Take  1 tablet (10 mg total) by mouth daily. 30 tablet 3  . metFORMIN (GLUCOPHAGE) 500 MG tablet Take 1 tablet (500 mg total) by mouth 2 (two) times daily with a meal. 60 tablet 1  . rivaroxaban (XARELTO) 20 MG TABS tablet Take 1 tablet (20 mg total) by mouth daily with supper. 90 tablet 3  . cetirizine (ZYRTEC) 10 MG tablet Take 1 tablet (10 mg total) by mouth daily. (Patient not taking: Reported on 10/15/2016) 30 tablet 1   No facility-administered medications prior to visit.     ROS Review of Systems  Constitutional: Negative for chills, fatigue and fever.  Respiratory: Negative for shortness of breath.   Cardiovascular: Positive for leg swelling. Negative for chest pain and palpitations.  Gastrointestinal: Negative for abdominal distention, abdominal pain and nausea.  Genitourinary: Negative for difficulty urinating.  Musculoskeletal: Negative for back pain.  Skin: Negative for rash.  Neurological: Negative for dizziness.    Objective:  BP 130/86 (BP Location: Left Arm, Patient Position: Sitting, Cuff Size: Large)   Pulse 87   Temp 98.8 F (37.1 C) (Oral)   Resp 20   Wt 289 lb (131.1 kg)   SpO2 96%   BMI 42.68 kg/m   BP/Weight 10/15/2016 09/17/2016 07/25/2016  Systolic BP 130 141 116  Diastolic BP 86 83 75  Wt. (Lbs) 289 275 284.8  BMI 42.68 40.61 42.06  Physical Exam  Constitutional: He is oriented to person, place, and time.  NAD, obese, polite  HENT:  Head: Normocephalic and atraumatic.  Eyes: Conjunctivae are normal. No scleral icterus.  Neck: No thyromegaly present.  Cardiovascular: Normal rate, regular rhythm and normal heart sounds.   Pulmonary/Chest: Effort normal and breath sounds normal.  Abdominal: Soft. Bowel sounds are normal. He exhibits no distension. There is no tenderness.  Musculoskeletal: Normal range of motion. He exhibits no edema, tenderness or deformity.  Limp on both legs while walking  Neurological: He is alert and oriented to person, place,  and time.  Skin: Skin is warm and dry. No rash noted. No erythema.  Psychiatric: He has a normal mood and affect.     Assessment & Plan:   1. Chronic pain of both knees Xray of knees will be conducted to help r/o osteoarthritis. If neg for OA  then will send to physical therapy instead of orthopedics - DG Knee Complete 4 Views Left; Future - DG Knee Complete 4 Views Right; Future  2. Medication refill -refilled all relevant medications.  3. Diabetes - metFORMIN (GLUCOPHAGE) 500 MG tablet; Take 1 tablet (500 mg total) by mouth 2 (two) times daily with a meal.  Dispense: 60 tablet; Refill: 1 - COMPLETE METABOLIC PANEL WITH GFR - Hemoglobin A1c - Lipid Panel  4. Essential hypertension  - lisinopril (PRINIVIL,ZESTRIL) 10 MG tablet; Take 1 tablet (10 mg total) by mouth daily.  Dispense: 30 tablet; Refill: 3 - hydrochlorothiazide (HYDRODIURIL) 25 MG tablet; Take 1 tablet (25 mg total) by mouth daily.  Dispense: 30 tablet; Refill: 3 - COMPLETE METABOLIC PANEL WITH GFR - Lipid Panel    Meds ordered this encounter  Medications  . rivaroxaban (XARELTO) 20 MG TABS tablet    Sig: Take 1 tablet (20 mg total) by mouth daily with supper.    Dispense:  90 tablet    Refill:  3    Order Specific Question:   Supervising Provider    Answer:   Quentin Angst [id number]  . metFORMIN (GLUCOPHAGE) 500 MG tablet    Sig: Take 1 tablet (500 mg total) by mouth 2 (two) times daily with a meal.    Dispense:  60 tablet    Refill:  1    Order Specific Question:   Supervising Provider    Answer:   Quentin Angst [id number]  . lisinopril (PRINIVIL,ZESTRIL) 10 MG tablet    Sig: Take 1 tablet (10 mg total) by mouth daily.    Dispense:  30 tablet    Refill:  3    Order Specific Question:   Supervising Provider    Answer:   Quentin Angst [id number]  . hydrochlorothiazide (HYDRODIURIL) 25 MG tablet    Sig: Take 1 tablet (25 mg total) by mouth daily.    Dispense:  30 tablet    Refill:   3    Order Specific Question:   Supervising Provider    Answer:   Quentin Angst [id number]  . gabapentin (NEURONTIN) 300 MG capsule    Sig: Take 1 capsule (300 mg total) by mouth 3 (three) times daily.    Dispense:  90 capsule    Refill:  3    Order Specific Question:   Supervising Provider    Answer:   Quentin Angst [id number]  . FLUoxetine (PROZAC) 20 MG tablet    Sig: Take 1 tablet (20 mg total) by mouth daily.    Dispense:  30 tablet  Refill:  3    Order Specific Question:   Supervising Provider    Answer:   Quentin Angst [id number]  . famotidine (PEPCID) 20 MG tablet    Sig: Take 1 tablet (20 mg total) by mouth daily as needed for heartburn.    Dispense:  30 tablet    Refill:  2    Order Specific Question:   Supervising Provider    Answer:   Quentin Angst [id number]  . acetaminophen (TYLENOL) 500 MG tablet    Sig: Take 2 tablets (1,000 mg total) by mouth every 8 (eight) hours as needed.    Dispense:  21 tablet    Refill:  0    Order Specific Question:   Supervising Provider    Answer:   Quentin Angst [id number]  . traMADol (ULTRAM) 50 MG tablet    Sig: Take 1 tablet (50 mg total) by mouth every 12 (twelve) hours as needed.    Dispense:  10 tablet    Refill:  0    Order Specific Question:   Supervising Provider    Answer:   Quentin Angst [id number]    Follow-up: Return in about 4 weeks (around 11/12/2016) for knee pain.   Loletta Specter PA

## 2016-10-15 NOTE — Patient Instructions (Signed)
I will await result from X rays and decide wether to send you to physical  Therapy or orthopedics.   Knee Pain Knee pain is a very common symptom and can have many causes. Knee pain often goes away when you follow your health care provider's instructions for relieving pain and discomfort at home. However, knee pain can develop into a condition that needs treatment. Some conditions may include:  Arthritis caused by wear and tear (osteoarthritis).  Arthritis caused by swelling and irritation (rheumatoid arthritis or gout).  A cyst or growth in your knee.  An infection in your knee joint.  An injury that will not heal.  Damage, swelling, or irritation of the tissues that support your knee (torn ligaments or tendinitis). If your knee pain continues, additional tests may be ordered to diagnose your condition. Tests may include X-rays or other imaging studies of your knee. You may also need to have fluid removed from your knee. Treatment for ongoing knee pain depends on the cause, but treatment may include:  Medicines to relieve pain or swelling.  Steroid injections in your knee.  Physical therapy.  Surgery. HOME CARE INSTRUCTIONS  Take medicines only as directed by your health care provider.  Rest your knee and keep it raised (elevated) while you are resting.  Do not do things that cause or worsen pain.  Avoid high-impact activities or exercises, such as running, jumping rope, or doing jumping jacks.  Apply ice to the knee area:  Put ice in a plastic bag.  Place a towel between your skin and the bag.  Leave the ice on for 20 minutes, 2-3 times a day.  Ask your health care provider if you should wear an elastic knee support.  Keep a pillow under your knee when you sleep.  Lose weight if you are overweight. Extra weight can put pressure on your knee.  Do not use any tobacco products, including cigarettes, chewing tobacco, or electronic cigarettes. If you need help quitting,  ask your health care provider. Smoking may slow the healing of any bone and joint problems that you may have. SEEK MEDICAL CARE IF:  Your knee pain continues, changes, or gets worse.  You have a fever along with knee pain.  Your knee buckles or locks up.  Your knee becomes more swollen. SEEK IMMEDIATE MEDICAL CARE IF:   Your knee joint feels hot to the touch.  You have chest pain or trouble breathing. This information is not intended to replace advice given to you by your health care provider. Make sure you discuss any questions you have with your health care provider. Document Released: 07/01/2007 Document Revised: 09/24/2014 Document Reviewed: 04/19/2014 Elsevier Interactive Patient Education  2017 [employer]

## 2016-10-16 ENCOUNTER — Ambulatory Visit (HOSPITAL_COMMUNITY)
Admission: RE | Admit: 2016-10-16 | Discharge: 2016-10-16 | Disposition: A | Discharge status: Home / Self Care | Payer: Self-pay | Source: Ambulatory Visit | Attending: Physician Assistant | Admitting: Physician Assistant

## 2016-10-16 DIAGNOSIS — M25562 Pain in left knee: Secondary | ICD-10-CM

## 2016-10-16 DIAGNOSIS — M170 Bilateral primary osteoarthritis of knee: Secondary | ICD-10-CM | POA: Insufficient documentation

## 2016-10-16 DIAGNOSIS — M25561 Pain in right knee: Secondary | ICD-10-CM

## 2016-10-16 DIAGNOSIS — G8929 Other chronic pain: Secondary | ICD-10-CM | POA: Insufficient documentation

## 2016-10-17 ENCOUNTER — Telehealth: Payer: Self-pay | Admitting: *Deleted

## 2016-10-17 NOTE — Progress Notes (Signed)
Left knee with mild osteoarthritis and right knee with age advanced osteoarthritis. He has to be referred to orthopedics. Please notify pt and let him know that I will put in the referral. He should hear from us or the ortho office with appointment details.

## 2016-10-17 NOTE — Telephone Encounter (Signed)
Pt  Name and DOB verified, pt aware of XR results and informed to await call from referrals or ortho office. Pt verbalized understanding and will call back if he has other questions or concerns.

## 2016-10-18 ENCOUNTER — Other Ambulatory Visit: Payer: Self-pay | Admitting: Physician Assistant

## 2016-10-18 DIAGNOSIS — Z96653 Presence of artificial knee joint, bilateral: Secondary | ICD-10-CM | POA: Insufficient documentation

## 2016-10-18 DIAGNOSIS — M170 Bilateral primary osteoarthritis of knee: Secondary | ICD-10-CM

## 2016-10-18 NOTE — Progress Notes (Signed)
Xrays show left knee with mild OA and right knee with age advanced OA.

## 2016-10-24 ENCOUNTER — Encounter: Payer: Self-pay | Admitting: Student

## 2016-10-24 ENCOUNTER — Ambulatory Visit (INDEPENDENT_AMBULATORY_CARE_PROVIDER_SITE_OTHER): Payer: Self-pay | Admitting: Student

## 2016-10-24 DIAGNOSIS — M170 Bilateral primary osteoarthritis of knee: Secondary | ICD-10-CM

## 2016-10-24 MED ORDER — METHYLPREDNISOLONE ACETATE 40 MG/ML IJ SUSP
40.0000 mg | Freq: Once | INTRAMUSCULAR | Status: AC
  Administered 2016-10-24: 40 mg via INTRA_ARTICULAR

## 2016-10-24 MED FILL — XARELTO 20 MG TABLET: 20 | 30 days supply | Qty: 30 | Fill #0

## 2016-10-24 MED FILL — GABAPENTIN 300 MG CAPSULE: 300 | 30 days supply | Qty: 90 | Fill #0

## 2016-10-24 MED FILL — LISINOPRIL 10 MG TABLET: 10 | 30 days supply | Qty: 30 | Fill #0

## 2016-10-24 MED FILL — HYDROCHLOROTHIAZIDE 25 MG T: 25 | 30 days supply | Qty: 30 | Fill #0

## 2016-10-24 MED FILL — ?METFORMIN HCL 500MG TABLET: 500 | 30 days supply | Qty: 60 | Fill #0

## 2016-10-24 MED FILL — FAMOTIDINE 20 MG TABLET: 20 | 30 days supply | Qty: 30 | Fill #0

## 2016-10-24 MED FILL — traMADol HCL 50 MG TABS: 50 | 5 days supply | Qty: 10 | Fill #0

## 2016-10-24 MED FILL — ?CETIRIZINE HCL 10 MG TABLE: 10 | 30 days supply | Qty: 30 | Fill #0

## 2016-10-24 MED FILL — FLUoxetine HCL 20 MG CAPS: 20 | 30 days supply | Qty: 30 | Fill #0

## 2016-10-25 NOTE — Assessment & Plan Note (Signed)
Bilateral knees injected at this visit. Medication management is limited due to the fact that he has diabetes and he is on Xarelto. Unable to do much prednisone because of his diabetes and would not like to use NSAIDs due to the DVT and uncontrolled hypertension of this visit. Recommended follow-up with primary care for blood pressure management.

## 2016-10-25 NOTE — Progress Notes (Signed)
  David Dunlap - 46 y.o. male MRN [id number]  Date of birth: [birth year]  SUBJECTIVE:  Including CC & ROS.  CC: bilateral knee pain  Patient presents with bilateral knee pain that is been ongoing for months. He reports swelling at the time of pain. He is unable stand for more than 2 hours at a time. He is currently not working because of this. He has not been working since October or November.  Denies any specific injury. Reports that he played sports when he was younger but denies any injuries in which his knee swelled up and he has never had any knee surgery.  Reports that it gets aching and stiffness. Denies any numbness or tingling.   ROS: No unexpected weight loss, fever, chills, instability, muscle pain, numbness/tingling, redness, otherwise see HPI   PMHx - Updated and reviewed.  Contributory factors include: Diabetes mellitus, history of DVT, hypertension, depression PSHx - Updated and reviewed.  Contributory factors include:   cholecystectomy, no history of knee surgeries FHx - Updated and reviewed.  Contributory factors include:  Negative Social Hx - Updated and reviewed. Contributory factors include: Negative, nonsmoker Medications - reviewed on xarelto   DATA REVIEWED: Bilateral x-rays which were not standing which showed advanced arthritis on the right and mild arthritis on the left. Unable to assess joint space due to the fact that these were not standing films   PHYSICAL EXAM:  VS: BP:(!) 154/103  HR: bpm  TEMP: ( )  RESP:   HT:5\' 9"  (175.3 cm)   WT:289 lb (131.1 kg)  BMI:42.8 PHYSICAL EXAM: Gen: NAD, alert, cooperative with exam, well-appearing HEENT: clear conjunctiva,  CV:  no edema, capillary refill brisk, normal rate Resp: non-labored Skin: no rashes, normal turgor  Neuro: no gross deficits.  Psych:  alert and oriented  Knee: Normal to inspection with no erythema or effusion or obvious bony abnormalities. Palpation with no warmth, joint line tenderness,  patellar tenderness, or condyle tenderness. He had diffuse global tenderness but no point tenderness bilaterally ROM full in flexion and extension and lower leg rotation. Ligaments with solid consistent endpoints including ACL, PCL, LCL, MCL. Negative Mcmurray's, Apley's, and Thessalonian tests. Non painful patellar compression. Patellar glide with crepitus bilaterally. Patellar and quadriceps tendons unremarkable.    ASSESSMENT & PLAN:   Osteoarthritis of knees, bilateral Bilateral knees injected at this visit. Medication management is limited due to the fact that he has diabetes and he is on Xarelto. Unable to do much prednisone because of his diabetes and would not like to use NSAIDs due to the DVT and uncontrolled hypertension of this visit. Recommended follow-up with primary care for blood pressure management.  Procedure:  Injection of left knee Consent obtained and verified. Time-out conducted. Noted no overlying erythema, induration, or other signs of local infection. Skin prepped in a sterile fashion. Topical analgesic spray: Ethyl chloride. Completed without difficulty. Meds: 40 mg depomedrol, 3 cc 1% lidocaine Pain immediately improved suggesting accurate placement of the medication. Advised to call if fevers/chills, erythema, induration, drainage, or persistent bleeding.    Procedure:  Injection of right knee Consent obtained and verified. Time-out conducted. Noted no overlying erythema, induration, or other signs of local infection. Skin prepped in a sterile fashion. Topical analgesic spray: Ethyl chloride. Completed without difficulty. Meds: 40 mg depomedrol, 3 cc 1% lidocaine Pain immediately improved suggesting accurate placement of the medication. Advised to call if fevers/chills, erythema, induration, drainage, or persistent bleeding.

## 2016-11-06 ENCOUNTER — Encounter: Payer: Self-pay | Admitting: Family Medicine

## 2016-11-06 ENCOUNTER — Encounter: Payer: Self-pay | Admitting: Licensed Clinical Social Worker

## 2016-11-06 ENCOUNTER — Ambulatory Visit: Payer: Self-pay | Attending: Family Medicine | Admitting: Family Medicine

## 2016-11-06 VITALS — BP 128/84 | HR 93 | Temp 98.7°F | Ht 69.5 in | Wt 288.6 lb

## 2016-11-06 DIAGNOSIS — Z86718 Personal history of other venous thrombosis and embolism: Secondary | ICD-10-CM | POA: Insufficient documentation

## 2016-11-06 DIAGNOSIS — I10 Essential (primary) hypertension: Secondary | ICD-10-CM | POA: Insufficient documentation

## 2016-11-06 DIAGNOSIS — R45851 Suicidal ideations: Secondary | ICD-10-CM | POA: Insufficient documentation

## 2016-11-06 DIAGNOSIS — Z79899 Other long term (current) drug therapy: Secondary | ICD-10-CM | POA: Insufficient documentation

## 2016-11-06 DIAGNOSIS — E119 Type 2 diabetes mellitus without complications: Secondary | ICD-10-CM | POA: Insufficient documentation

## 2016-11-06 DIAGNOSIS — Z7984 Long term (current) use of oral hypoglycemic drugs: Secondary | ICD-10-CM | POA: Insufficient documentation

## 2016-11-06 DIAGNOSIS — M170 Bilateral primary osteoarthritis of knee: Secondary | ICD-10-CM | POA: Insufficient documentation

## 2016-11-06 DIAGNOSIS — I82503 Chronic embolism and thrombosis of unspecified deep veins of lower extremity, bilateral: Secondary | ICD-10-CM

## 2016-11-06 DIAGNOSIS — F322 Major depressive disorder, single episode, severe without psychotic features: Secondary | ICD-10-CM | POA: Insufficient documentation

## 2016-11-06 DIAGNOSIS — Z7901 Long term (current) use of anticoagulants: Secondary | ICD-10-CM | POA: Insufficient documentation

## 2016-11-06 DIAGNOSIS — Z86711 Personal history of pulmonary embolism: Secondary | ICD-10-CM | POA: Insufficient documentation

## 2016-11-06 LAB — POCT GLYCOSYLATED HEMOGLOBIN (HGB A1C): HEMOGLOBIN A1C: 7.6

## 2016-11-06 LAB — GLUCOSE, POCT (MANUAL RESULT ENTRY): POC GLUCOSE: 166 mg/dL — AB (ref 70–99)

## 2016-11-06 MED ORDER — ACETAMINOPHEN-CODEINE #3 300-30 MG PO TABS: 1.0000 | ORAL_TABLET | Freq: Two times a day (BID) | ORAL | 0 refills | Status: DC | PRN

## 2016-11-06 MED ORDER — METFORMIN HCL 500 MG PO TABS: 1000.0000 mg | ORAL_TABLET | Freq: Two times a day (BID) | ORAL | 3 refills | Status: DC

## 2016-11-06 MED ORDER — FLUOXETINE HCL 20 MG PO TABS: 40.0000 mg | ORAL_TABLET | Freq: Every day | ORAL | 3 refills | Status: DC

## 2016-11-06 MED FILL — ACETAMINOPHEN/COD #3 TABLET: 300-30 | 23 days supply | Qty: 45 | Fill #0

## 2016-11-06 MED FILL — FLUoxetine HCL 20 MG CAPS: 20 | 30 days supply | Qty: 60 | Fill #0

## 2016-11-06 MED FILL — ?METFORMIN HCL 500MG TABLET: 500 | 30 days supply | Qty: 120 | Fill #0

## 2016-11-06 NOTE — Progress Notes (Signed)
Subjective:  Patient ID: David Dunlap, male    DOB: 07-04-1971  Age: 46 y.o. MRN: [id number]  CC: Knee Pain (bilateral) and Diabetes   HPI David Dunlap 46 year old male with a history of type 2 diabetes mellitus (A1c 7.6 which has trended up from 6.3 previously, hypertension, chronic PE, depression who presents today for follow-up visit.  He endorses compliance with his medications but not with exercise or a diabetic diet. Denies side effects from medications.  He does have bilateral knee pain and x-rays revealed age advanced osteoarthritis of the right knee and mild osteoarthritis of the left knee. Knee pain is associated with swelling R>L.  His depression has been uncontrolled and he endorses suicidal ideation but no intent. Sometimes he has voiced this out after his wife and he denies any triggers but has been compliant with his Prozac. He is requesting an increase in the dose of his Prozac.  Past Medical History:  Diagnosis Date  . Depression   . Diabetes mellitus without complication (HCC) 05/2016   NEW ONSET    WITH FAMILY HISTORY  . DVT (deep venous thrombosis) (HCC)   . Essential hypertension   . Gallstones 05/2016    Past Surgical History:  Procedure Laterality Date  . CHOLECYSTECTOMY N/A 06/07/2016   Procedure: LAPAROSCOPIC CHOLECYSTECTOMY WITH ATTEMPTED INTRAOPERATIVE CHOLANGIOGRAM;  Surgeon: Violeta GelinasBurke Thompson, MD;  Location: [facility] OR;  Service: General;  Laterality: N/A;  . LIGAMENT REPAIR     R forearm    Allergies  Allergen Reactions  . No Known Allergies      Outpatient Medications Prior to Visit  Medication Sig Dispense Refill  . Blood Glucose Monitoring Suppl (TRUE METRIX METER) DEVI 1 each by Does not apply route 3 (three) times daily before meals. 1 Device 0  . cetirizine (ZYRTEC) 10 MG tablet Take 1 tablet (10 mg total) by mouth daily. 30 tablet 1  . famotidine (PEPCID) 20 MG tablet Take 1 tablet (20 mg total) by mouth daily as needed for  heartburn. 30 tablet 2  . gabapentin (NEURONTIN) 300 MG capsule Take 1 capsule (300 mg total) by mouth 3 (three) times daily. 90 capsule 3  . glucose blood (TRUE METRIX BLOOD GLUCOSE TEST) test strip Used daily before meals 100 each 12  . hydrochlorothiazide (HYDRODIURIL) 25 MG tablet Take 1 tablet (25 mg total) by mouth daily. 30 tablet 3  . lisinopril (PRINIVIL,ZESTRIL) 10 MG tablet Take 1 tablet (10 mg total) by mouth daily. 30 tablet 3  . rivaroxaban (XARELTO) 20 MG TABS tablet Take 1 tablet (20 mg total) by mouth daily with supper. 90 tablet 3  . TRUEPLUS LANCETS 28G MISC Use daily before meals 30 each 12  . FLUoxetine (PROZAC) 20 MG tablet Take 1 tablet (20 mg total) by mouth daily. 30 tablet 3  . metFORMIN (GLUCOPHAGE) 500 MG tablet Take 1 tablet (500 mg total) by mouth 2 (two) times daily with a meal. 60 tablet 1   No facility-administered medications prior to visit.     ROS Review of Systems  Constitutional: Negative for activity change and appetite change.  HENT: Negative for sinus pressure and sore throat.   Eyes: Negative for visual disturbance.  Respiratory: Negative for cough, chest tightness and shortness of breath.   Cardiovascular: Negative for chest pain and leg swelling.  Gastrointestinal: Negative for abdominal distention, abdominal pain, constipation and diarrhea.  Endocrine: Negative.   Genitourinary: Negative for dysuria.  Musculoskeletal:       See hpi  Skin: Negative for rash.  Allergic/Immunologic: Negative.   Neurological: Negative for weakness, light-headedness and numbness.  Psychiatric/Behavioral: Positive for suicidal ideas. Negative for dysphoric mood.    Objective:  BP 128/84 (BP Location: Right Arm, Patient Position: Sitting, Cuff Size: Large)   Pulse 93   Temp 98.7 F (37.1 C) (Oral)   Ht 5' 9.5" (1.765 m)   Wt 288 lb 9.6 oz (130.9 kg)   SpO2 96%   BMI 42.01 kg/m   BP/Weight 11/06/2016 10/24/2016 10/15/2016  Systolic BP 128 154 130  Diastolic  BP 84 103 86  Wt. (Lbs) 288.6 289 289  BMI 42.01 42.68 42.68      Physical Exam  Constitutional: He is oriented to person, place, and time. He appears well-developed and well-nourished.  Cardiovascular: Normal rate, normal heart sounds and intact distal pulses.   No murmur heard. Pulmonary/Chest: Effort normal and breath sounds normal. He has no wheezes. He has no rales. He exhibits no tenderness.  Abdominal: Soft. Bowel sounds are normal. He exhibits no distension and no mass. There is no tenderness.  Musculoskeletal: He exhibits tenderness (bilateral tenderness on ROM of knees). He exhibits no edema.  Neurological: He is alert and oriented to person, place, and time.  Skin: Skin is warm.  Psychiatric: He has a normal mood and affect.   Depression screen [facility] 2/9 11/06/2016 10/24/2016 10/15/2016 07/25/2016 06/01/2016  Decreased Interest 3 0 3 2 0  Down, Depressed, Hopeless 3 0 3 1 1   PHQ - 2 Score 6 0 6 3 1   Altered sleeping 1 0 2 1 -  Tired, decreased energy 0 0 3 0 -  Change in appetite 1 0 0 0 -  Feeling bad or failure about yourself  3 0 3 1 -  Trouble concentrating 0 0 0 0 -  Moving slowly or fidgety/restless 0 0 0 0 -  Suicidal thoughts 3 0 1 0 -  PHQ-9 Score 14 0 15 5 -     Lab Results  Component Value Date   HGBA1C 7.6 11/06/2016    CLINICAL DATA:  46 year old patient with generalized knee pain. No known injury.  EXAM: RIGHT KNEE - COMPLETE 4+ VIEW  COMPARISON:  No prior knee radiographs for comparison.  FINDINGS: The knee is located. No acute fracture. No suprapatellar joint effusion identified. There is exuberant, age-advanced osteophyte formation involving all 3 compartments of the knee. There is joint space narrowing of the medial compartment and of the patellofemoral compartment. Joint space of the lateral compartment appears preserved.  IMPRESSION: Age-advanced osteoarthritis of the right knee.   Electronically Signed   By: David Dunlap M.D.    On: 10/16/2016 13:54   CLINICAL DATA:  Pt is having bilateral anterior and lateral knee pain. No known injury.Hx of playing lots of sports as a child. No Hx of injury or surgery to either knee Hx of bilateral deep venous thrombosis  EXAM: LEFT KNEE - COMPLETE 4+ VIEW  COMPARISON:  Left tibia and fibula, 10/28/2015  FINDINGS: No fracture.  No bone lesion.  Mild medial joint space compartment narrowing with small marginal osteophytes. Minimal osteophytes from the patella. No other degenerative/ arthropathic change.  No convincing joint effusion.  The soft tissues are unremarkable.  IMPRESSION: 1. No fracture or acute finding. 2. Mild osteoarthritis.   Electronically Signed   By: Amie Portland M.D.   On: 10/16/2016 13:51   Assessment & Plan:   1. Diabetes mellitus without complication (HCC) A1c of 7.6 which has trended  up from 6.3 previously Increase dose of metformin - Glucose (CBG) - HgB A1c - metFORMIN (GLUCOPHAGE) 500 MG tablet; Take 2 tablets (1,000 mg total) by mouth 2 (two) times daily with a meal.  Dispense: 120 tablet; Refill: 3  2. Primary osteoarthritis of both knees Not relieved with Tramadol Placed on Tylenol #3 and he will need Ortho referral once he has been approved for CAFA Given a note for work : max standing duration - 30 minutes Rx previously written for #450 tabs but corrected to #45  3. Severe single current episode of major depressive disorder, without psychotic features (HCC) Uncontrolled Presence of suicidal ideation; unsure of trigger LCSW called in for therapy; provided crises number Discussed coping mechanisms - FLUoxetine (PROZAC) 20 MG tablet; Take 2 tablets (40 mg total) by mouth daily.  Dispense: 60 tablet; Refill: 3  4. 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 Essential hypertension Controlled Continue antihypertensives, low sodium diet   Meds  ordered this encounter  Medications  . metFORMIN (GLUCOPHAGE) 500 MG tablet    Sig: Take 2 tablets (1,000 mg total) by mouth 2 (two) times daily with a meal.    Dispense:  120 tablet    Refill:  3    Discontinue previous dose  . FLUoxetine (PROZAC) 20 MG tablet    Sig: Take 2 tablets (40 mg total) by mouth daily.    Dispense:  60 tablet    Refill:  3  . acetaminophen-codeine (TYLENOL #3) 300-30 MG tablet    Sig: Take 1 tablet by mouth every 12 (twelve) hours as needed for moderate pain.    Dispense:  450 tablet    Refill:  0    Follow-up: Return in about 3 weeks (around 11/27/2016) for FOLLOW UP OF DEPRESSION.   Jaclyn Shaggy MD

## 2016-11-06 NOTE — Progress Notes (Signed)
Would like to increase mg on depression meds- still feels very depressed  Thoughts out loud regarding " killing himself"- to his wife  [facility]

## 2016-11-06 NOTE — BH Specialist Note (Signed)
Session Start time: 11:45 AM   End Time: 12:15 PM Total Time:  30 minutes Type of Service: Behavioral Health - Individual/Family Interpreter: No.   Interpreter Name & Language: N/A # [facility]   SUBJECTIVE: David Dunlap is a 46 y.o. male  Pt. was referred by Dr. Venetia NightAmao for:  anxiety and depression. Pt. reports the following symptoms/concerns: overwhelming feelings of sadness and worry, anhedonia, sleeping for extended periods of time (many naps throughout the day), low energy and motivation, difficulty focusing, irritability, wavering appetite, and suicidal ideations   Duration of problem:  "A while" Severity: severe Previous treatment: None reported   OBJECTIVE: Mood: Depressed & Affect: Appropriate Risk of harm to self or others: Pt has hx of passive suicidal ideations. Pt denies plan or intent to self-harm and/or harm others Assessments administered: PHQ-9; GAD-7  LIFE CONTEXT:  Family & Social: Pt resides with a best friend. He has three adult sons and two minor children (ages 3910 and 255) He receives emotional support from a couple of best friends and family (mother and two sisters) who reside nearby.   School/ Work: Pt is unemployed. He has applied for disability and is in the appeal process. He receives food stamps [phone]($192) Self-Care: Pt reports medication compliance and listening to music to assist in motivation throughout the day. He did not report substance use Life changes: Pt has financial strain and worries about the well-being of his two minor children (mother abuses substances and is in and out of their lives) What is important to pt/family (values): Family, Good Health, Independence   GOALS ADDRESSED:  Decrease symptoms of depression Decrease symptoms of anxiety Increase knowledge of coping skills  INTERVENTIONS: Solution Focused, Strength-based and Supportive   ASSESSMENT:  Pt currently experiencing depression and anxiety triggered by  financial strain and conflict within family. Pt reports overwhelming feelings of sadness and worry, anhedonia, sleeping for extended periods of time (many naps throughout the day), low energy and motivation, difficulty focusing, irritability, wavering appetite, and passive suicidal ideations. Pt may benefit from psychoeducation, psychotherapy, and medication management. LCSWA provided education on how psychosocial stressors can negatively impact one's mental and physical health. LCSWA inquired protective factors with pt and assisted in creating a safety plan. LCSWA discussed benefits of applying healthy coping skills to decrease symptoms of depression and anxiety. Pt identified strategies that he can implement on a daily basis to cope with stressors. LCSWA provided community resources for crisis intervention, therapy, and medication management. Pt was provided additional resources on food insecurity and encouraged to schedule appointment with Financial Counseling to assist with financial strain.      PLAN: 1. F/U with behavioral health clinician: Pt was encouraged to contact LCSWA if symptoms worsen or fail to improve to [facility] 2. Behavioral Health meds: Prozac 3. Behavioral recommendations: LCSWA recommends that pt apply healthy coping skills discussed, initiate behavioral health resources, schedule appointment with Financial Counseling, and utilize community resources. Pt is encouraged to schedule follow up appointment with LCSWA 4. Referral: Brief Counseling/Psychotherapy, [facility], Problem-solving teaching/coping strategies, Psychoeducation and Supportive Counseling 5. From scale of 1-10, how likely are you to follow plan: 7/10   Bridgett LarssonJasmine D Lewis, MSW, [facility]SWA  Clinical Social Worker 11/07/16 9:48 AM  Marlon PelWarmhandoff:   Warm Hand Off Completed.

## 2016-11-16 ENCOUNTER — Ambulatory Visit: Payer: Self-pay | Attending: Family Medicine

## 2016-11-27 ENCOUNTER — Ambulatory Visit: Payer: Self-pay | Attending: Family Medicine | Admitting: Family Medicine

## 2016-11-27 ENCOUNTER — Encounter: Payer: Self-pay | Admitting: Family Medicine

## 2016-11-27 VITALS — BP 138/87 | HR 99 | Temp 98.3°F | Ht 69.5 in | Wt 299.2 lb

## 2016-11-27 DIAGNOSIS — G629 Polyneuropathy, unspecified: Secondary | ICD-10-CM

## 2016-11-27 DIAGNOSIS — K219 Gastro-esophageal reflux disease without esophagitis: Secondary | ICD-10-CM | POA: Insufficient documentation

## 2016-11-27 DIAGNOSIS — E1140 Type 2 diabetes mellitus with diabetic neuropathy, unspecified: Secondary | ICD-10-CM | POA: Insufficient documentation

## 2016-11-27 DIAGNOSIS — Z7984 Long term (current) use of oral hypoglycemic drugs: Secondary | ICD-10-CM | POA: Insufficient documentation

## 2016-11-27 DIAGNOSIS — F321 Major depressive disorder, single episode, moderate: Secondary | ICD-10-CM | POA: Insufficient documentation

## 2016-11-27 DIAGNOSIS — I10 Essential (primary) hypertension: Secondary | ICD-10-CM | POA: Insufficient documentation

## 2016-11-27 DIAGNOSIS — I82503 Chronic embolism and thrombosis of unspecified deep veins of lower extremity, bilateral: Secondary | ICD-10-CM | POA: Insufficient documentation

## 2016-11-27 DIAGNOSIS — E119 Type 2 diabetes mellitus without complications: Secondary | ICD-10-CM

## 2016-11-27 DIAGNOSIS — Z7901 Long term (current) use of anticoagulants: Secondary | ICD-10-CM | POA: Insufficient documentation

## 2016-11-27 DIAGNOSIS — Z79899 Other long term (current) drug therapy: Secondary | ICD-10-CM | POA: Insufficient documentation

## 2016-11-27 DIAGNOSIS — M170 Bilateral primary osteoarthritis of knee: Secondary | ICD-10-CM | POA: Insufficient documentation

## 2016-11-27 LAB — GLUCOSE, POCT (MANUAL RESULT ENTRY): POC Glucose: 270 mg/dl — AB (ref 70–99)

## 2016-11-27 MED ORDER — FAMOTIDINE 20 MG PO TABS: 20.0000 mg | ORAL_TABLET | Freq: Every day | ORAL | 3 refills | Status: DC | PRN

## 2016-11-27 MED ORDER — ACETAMINOPHEN-CODEINE #3 300-30 MG PO TABS: 1.0000 | ORAL_TABLET | Freq: Two times a day (BID) | ORAL | 1 refills | Status: DC | PRN

## 2016-11-27 MED ORDER — LISINOPRIL 10 MG PO TABS: 10.0000 mg | ORAL_TABLET | Freq: Every day | ORAL | 3 refills | Status: DC

## 2016-11-27 MED ORDER — GABAPENTIN 300 MG PO CAPS: 300.0000 mg | ORAL_CAPSULE | Freq: Three times a day (TID) | ORAL | 3 refills | Status: DC

## 2016-11-27 MED FILL — GABAPENTIN 300 MG CAPSULE: 300 | 30 days supply | Qty: 90 | Fill #0

## 2016-11-27 MED FILL — ?LISINOPRIL 10 MG TABLET: 10 | 30 days supply | Qty: 30 | Fill #0

## 2016-11-27 MED FILL — ACETAMINOPHEN/COD #3 TABLET: 300-30 | 25 days supply | Qty: 50 | Fill #0

## 2016-11-27 MED FILL — FAMOTIDINE 20 MG TABLET: 20 | 30 days supply | Qty: 30 | Fill #0

## 2016-11-27 MED FILL — ?CETIRIZINE HCL 10 MG TABLE: 10 | 30 days supply | Qty: 30 | Fill #1

## 2016-11-27 MED FILL — XARELTO 20 MG TABLET: 20 | 30 days supply | Qty: 30 | Fill #1

## 2016-11-27 MED FILL — HYDROCHLOROTHIAZIDE 25 MG T: 25 | 30 days supply | Qty: 30 | Fill #1

## 2016-11-27 NOTE — Progress Notes (Signed)
Subjective:  Patient ID: David Dunlap, male    DOB: 08/02/1971  Age: 46 y.o. MRN: [id number]  CC: Knee Pain (R > L); Diabetes; and Hypertension   HPI David Dunlap 46 year old male with a history of type 2 diabetes mellitus (A1c 7.6 which has trended up from 6.3 previously) hypertension, chronic PE, depression who presents today for follow-up visit.  He endorses compliance with his medications but not with exercise or a diabetic diet. Denies side effects from medications.  He does have bilateral knee pain and x-rays revealed age advanced osteoarthritis of the right knee and mild osteoarthritis of the left knee. Knee pain is associated with swelling R>L.Tylenol 3 does not help much  Prozac was increased from 20 mg to 40 mg at his last office visit and he reports improvement in depression. Denies suicidal ideation or intent.  Past Medical History:  Diagnosis Date  . Depression   . Diabetes mellitus without complication (HCC) 05/2016   NEW ONSET    WITH FAMILY HISTORY  . DVT (deep venous thrombosis) (HCC)   . Essential hypertension   . Gallstones 05/2016    Past Surgical History:  Procedure Laterality Date  . CHOLECYSTECTOMY N/A 06/07/2016   Procedure: LAPAROSCOPIC CHOLECYSTECTOMY WITH ATTEMPTED INTRAOPERATIVE CHOLANGIOGRAM;  Surgeon: Violeta Gelinas, MD;  Location: [facility] OR;  Service: General;  Laterality: N/A;  . LIGAMENT REPAIR     R forearm    Allergies  Allergen Reactions  . No Known Allergies      Outpatient Medications Prior to Visit  Medication Sig Dispense Refill  . Blood Glucose Monitoring Suppl (TRUE METRIX METER) DEVI 1 each by Does not apply route 3 (three) times daily before meals. 1 Device 0  . cetirizine (ZYRTEC) 10 MG tablet Take 1 tablet (10 mg total) by mouth daily. 30 tablet 1  . FLUoxetine (PROZAC) 20 MG tablet Take 2 tablets (40 mg total) by mouth daily. 60 tablet 3  . glucose blood (TRUE METRIX BLOOD GLUCOSE TEST) test strip Used daily before  meals 100 each 12  . hydrochlorothiazide (HYDRODIURIL) 25 MG tablet Take 1 tablet (25 mg total) by mouth daily. 30 tablet 3  . metFORMIN (GLUCOPHAGE) 500 MG tablet Take 2 tablets (1,000 mg total) by mouth 2 (two) times daily with a meal. 120 tablet 3  . rivaroxaban (XARELTO) 20 MG TABS tablet Take 1 tablet (20 mg total) by mouth daily with supper. 90 tablet 3  . TRUEPLUS LANCETS 28G MISC Use daily before meals 30 each 12  . acetaminophen-codeine (TYLENOL #3) 300-30 MG tablet Take 1 tablet by mouth every 12 (twelve) hours as needed for moderate pain. 45 tablet 0  . famotidine (PEPCID) 20 MG tablet Take 1 tablet (20 mg total) by mouth daily as needed for heartburn. 30 tablet 2  . gabapentin (NEURONTIN) 300 MG capsule Take 1 capsule (300 mg total) by mouth 3 (three) times daily. 90 capsule 3  . lisinopril (PRINIVIL,ZESTRIL) 10 MG tablet Take 1 tablet (10 mg total) by mouth daily. 30 tablet 3   No facility-administered medications prior to visit.     ROS Review of Systems  Constitutional: Negative for activity change and appetite change.  HENT: Negative for sinus pressure and sore throat.   Eyes: Negative for visual disturbance.  Respiratory: Negative for cough, chest tightness and shortness of breath.   Cardiovascular: Negative for chest pain and leg swelling.  Gastrointestinal: Negative for abdominal distention, abdominal pain, constipation and diarrhea.  Endocrine: Negative.   Genitourinary: Negative  for dysuria.  Musculoskeletal:       See hpi  Skin: Negative for rash.  Allergic/Immunologic: Negative.   Neurological: Negative for weakness, light-headedness and numbness.  Psychiatric/Behavioral: Negative for dysphoric mood and suicidal ideas.    Objective:  BP 138/87 (BP Location: Right Arm, Patient Position: Sitting, Cuff Size: Large)   Pulse 99   Temp 98.3 F (36.8 C) (Oral)   Ht 5' 9.5" (1.765 m)   Wt 299 lb 3.2 oz (135.7 kg)   SpO2 95%   BMI 43.55 kg/m   BP/Weight  11/27/2016 11/06/2016 10/24/2016  Systolic BP 138 128 154  Diastolic BP 87 84 103  Wt. (Lbs) 299.2 288.6 289  BMI 43.55 42.01 42.68      Physical Exam Constitutional: He is oriented to person, place, and time. He appears well-developed and well-nourished.  Cardiovascular: Normal rate, normal heart sounds and intact distal pulses.   No murmur heard. Pulmonary/Chest: Effort normal and breath sounds normal. He has no wheezes. He has no rales. He exhibits no tenderness.  Abdominal: Soft. Bowel sounds are normal. He exhibits no distension and no mass. There is no tenderness.  Musculoskeletal: He exhibits tenderness and crepitus(bilateral tenderness on ROM of knees). He exhibits no edema.  Neurological: He is alert and oriented to person, place, and time.  Skin: Skin is warm.  Psychiatric: He has a normal mood and affect.  Lab Results  Component Value Date   HGBA1C 7.6 11/06/2016    Assessment & Plan:   1. Diabetes mellitus without complication (HCC) A1c of 7.6 Encouraged adherence to a diabetic diet and weight loss - Glucose (CBG)  2. Primary osteoarthritis of both knees Advanced osteoarthritis as per x-ray Holding off on NSAIDs due to interaction refilled. Ortho referral pending approval of [place] card - acetaminophen-codeine (TYLENOL #3) 300-30 MG tablet; Take 1 tablet by mouth every 12 (twelve) hours as needed for moderate pain.  Dispense: 50 tablet; Refill: 1  3. Neuropathy (HCC) stable - gabapentin (NEURONTIN) 300 MG capsule; Take 1 capsule (300 mg total) by mouth 3 (three) times daily.  Dispense: 90 capsule; Refill: 3  4. Essential hypertension controlled - lisinopril (PRINIVIL,ZESTRIL) 10 MG tablet; Take 1 tablet (10 mg total) by mouth daily.  Dispense: 30 tablet; Refill: 3  5. 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 Gastroesophageal reflux disease without esophagitis controlled - famotidine (PEPCID) 20 MG  tablet; Take 1 tablet (20 mg total) by mouth daily as needed for heartburn.  Dispense: 30 tablet; Refill: 3  7. Moderate major depression, single episode (HCC) Controlled on Prozac We'll reassess at next visit    Meds ordered this encounter  Medications  . lisinopril (PRINIVIL,ZESTRIL) 10 MG tablet    Sig: Take 1 tablet (10 mg total) by mouth daily.    Dispense:  30 tablet    Refill:  3  . gabapentin (NEURONTIN) 300 MG capsule    Sig: Take 1 capsule (300 mg total) by mouth 3 (three) times daily.    Dispense:  90 capsule    Refill:  3  . famotidine (PEPCID) 20 MG tablet    Sig: Take 1 tablet (20 mg total) by mouth daily as needed for heartburn.    Dispense:  30 tablet    Refill:  3  . acetaminophen-codeine (TYLENOL #3) 300-30 MG tablet    Sig: Take 1 tablet by mouth every 12 (twelve) hours as needed for moderate pain.    Dispense:  50 tablet    Refill:  1    Follow-up: Return in about 6 weeks (around 01/08/2017) for Follow-up on depression.   Jaclyn Shaggy MD

## 2016-11-27 NOTE — Progress Notes (Signed)
Refill- both BP meds, xarelto and tylenol #3 (not helping at this time)

## 2016-11-28 ENCOUNTER — Telehealth: Payer: Self-pay | Admitting: Family Medicine

## 2016-11-28 ENCOUNTER — Emergency Department (HOSPITAL_COMMUNITY): Payer: Self-pay

## 2016-11-28 ENCOUNTER — Encounter (HOSPITAL_COMMUNITY): Payer: Self-pay | Admitting: *Deleted

## 2016-11-28 DIAGNOSIS — Z7901 Long term (current) use of anticoagulants: Secondary | ICD-10-CM | POA: Insufficient documentation

## 2016-11-28 DIAGNOSIS — E119 Type 2 diabetes mellitus without complications: Secondary | ICD-10-CM | POA: Insufficient documentation

## 2016-11-28 DIAGNOSIS — R51 Headache: Secondary | ICD-10-CM | POA: Insufficient documentation

## 2016-11-28 DIAGNOSIS — I10 Essential (primary) hypertension: Secondary | ICD-10-CM | POA: Insufficient documentation

## 2016-11-28 DIAGNOSIS — R0782 Intercostal pain: Secondary | ICD-10-CM | POA: Insufficient documentation

## 2016-11-28 DIAGNOSIS — M170 Bilateral primary osteoarthritis of knee: Secondary | ICD-10-CM

## 2016-11-28 DIAGNOSIS — Z7984 Long term (current) use of oral hypoglycemic drugs: Secondary | ICD-10-CM | POA: Insufficient documentation

## 2016-11-28 LAB — CBC
HEMATOCRIT: 42.2 % (ref 39.0–52.0)
Hemoglobin: 15.1 g/dL (ref 13.0–17.0)
MCH: 28.8 pg (ref 26.0–34.0)
MCHC: 35.8 g/dL (ref 30.0–36.0)
MCV: 80.5 fL (ref 78.0–100.0)
Platelets: 183 10*3/uL (ref 150–400)
RBC: 5.24 MIL/uL (ref 4.22–5.81)
RDW: 13.8 % (ref 11.5–15.5)
WBC: 14.4 10*3/uL — ABNORMAL HIGH (ref 4.0–10.5)

## 2016-11-28 LAB — BASIC METABOLIC PANEL
Anion gap: 11 (ref 5–15)
BUN: 13 mg/dL (ref 6–20)
CHLORIDE: 98 mmol/L — AB (ref 101–111)
CO2: 25 mmol/L (ref 22–32)
Calcium: 9 mg/dL (ref 8.9–10.3)
Creatinine, Ser: 1.39 mg/dL — ABNORMAL HIGH (ref 0.61–1.24)
GFR calc Af Amer: >60 mL/min (ref >60)
GFR calc non Af Amer: 59 mL/min — ABNORMAL LOW (ref >60)
GLUCOSE: 128 mg/dL — AB (ref 65–99)
POTASSIUM: 4.2 mmol/L (ref 3.5–5.1)
Sodium: 134 mmol/L — ABNORMAL LOW (ref 135–145)

## 2016-11-28 LAB — I-STAT TROPONIN, ED: Troponin i, poc: 0 ng/mL (ref 0.00–0.08)

## 2016-11-28 NOTE — Telephone Encounter (Signed)
Thank you Amy  But unfortunately  OC don't cover Orthopedic he need to have CAFA letter .  I spoke to Mr David Dunlap he is aware of that and when he receive the letter will bring it to scan and please, let me know so , I can process the orthopedic referral .

## 2016-11-28 NOTE — ED Triage Notes (Signed)
Pt with right sided chest pain that increases with any movement.  Pt states that he has had a cough and that the pain increases when coughing and feels like pins and needles.

## 2016-11-28 NOTE — Telephone Encounter (Signed)
Pt came in to show us that he been approved for (and received) his OC and would like for his referral to have knee surgery be placed as soon as possible. Please f/u.

## 2016-11-29 ENCOUNTER — Emergency Department (HOSPITAL_COMMUNITY)
Admission: EM | Admit: 2016-11-29 | Discharge: 2016-11-29 | Disposition: A | Discharge status: Home / Self Care | Payer: Self-pay | Attending: Emergency Medicine | Admitting: Emergency Medicine

## 2016-11-29 DIAGNOSIS — R0782 Intercostal pain: Secondary | ICD-10-CM

## 2016-11-29 DIAGNOSIS — G44209 Tension-type headache, unspecified, not intractable: Secondary | ICD-10-CM

## 2016-11-29 MED ORDER — HYDROCODONE-ACETAMINOPHEN 5-325 MG PO TABS
1.0000 | ORAL_TABLET | Freq: Once | ORAL | Status: AC
  Administered 2016-11-29: 1 via ORAL
  Filled 2016-11-29: qty 1

## 2016-11-29 NOTE — ED Notes (Signed)
Pt departed in NAD.  

## 2016-11-29 NOTE — ED Notes (Signed)
Pt aware of wait tp treatment room no complaints noted at this time

## 2016-11-29 NOTE — ED Provider Notes (Signed)
[facility]-EMERGENCY DEPT Provider Note   CSN: [id number] Arrival date & time: 11/28/16  2241   By signing my name below, I, David Dunlap, attest that this documentation has been prepared under the direction and in the presence of David Rhine, MD  Electronically Signed: Clovis Dunlap, ED Scribe. 11/29/16. 2:28 AM.   History   Chief Complaint Chief Complaint  Patient presents with  . Chest Pain   The history is provided by the patient. No language interpreter was used.  Chest Pain   This is a new problem. The current episode started yesterday. The problem has not changed since onset.The pain is associated with movement. The pain is moderate. Associated symptoms include headaches, lower extremity edema and vomiting. Pertinent negatives include no abdominal pain, no fever, no hemoptysis, no shortness of breath and no syncope. He has tried nothing for the symptoms. The treatment provided no relief.  His past medical history is significant for diabetes, DVT and hypertension.   HPI Comments:  David Dunlap is a 46 y.o. male, with a PMHx of DVT on Xarelto, HTN and DM, who presents to the Emergency Department complaining of acute onset, right sided chest wall pain which began around 7 PM yesterday. His pain is worse with movement and upon palpation. Pt also reports pain from the right side of his neck down to his right knee, a headache, chronic leg swelling and resolved vomiting. No alleviating factors noted. Pt denies fevers, hemoptysis, SOB, abdominal pain, LOC, a hx of MI or any other associated symptoms.   Past Medical History:  Diagnosis Date  . Depression   . Diabetes mellitus without complication (HCC) 05/2016   NEW ONSET    WITH FAMILY HISTORY  . DVT (deep venous thrombosis) (HCC)   . Essential hypertension   . Gallstones 05/2016    Patient Active Problem List   Diagnosis Date Noted  . GERD (gastroesophageal reflux disease) 11/27/2016  . Osteoarthritis of knees, bilateral  10/18/2016  . Neuropathy (HCC) 07/25/2016  . Acute cholecystitis 06/05/2016  . Diabetes mellitus without complication (HCC) 05/18/2016  . Cholelithiasis 04/09/2016  . Major depressive disorder, single episode 04/09/2016  . Leg DVT (deep venous thromboembolism), chronic (HCC) 12/08/2014  . Family history of diabetes mellitus (DM) 12/08/2014  . DVT, femoral, acute (HCC) 03/22/2014  . Moderate major depression, single episode (HCC) 08/22/2011  . Constipation 11/22/2010  . ANKLE EDEMA 10/19/2010  . Essential hypertension 10/12/2010    Past Surgical History:  Procedure Laterality Date  . CHOLECYSTECTOMY N/A 06/07/2016   Procedure: LAPAROSCOPIC CHOLECYSTECTOMY WITH ATTEMPTED INTRAOPERATIVE CHOLANGIOGRAM;  Surgeon: Violeta Gelinas, MD;  Location: [facility] OR;  Service: General;  Laterality: N/A;  . LIGAMENT REPAIR     R forearm    Home Medications    Prior to Admission medications   Medication Sig Start Date End Date Taking? Authorizing Provider  acetaminophen-codeine (TYLENOL #3) 300-30 MG tablet Take 1 tablet by mouth every 12 (twelve) hours as needed for moderate pain. 11/27/16   Jaclyn Shaggy, MD  Blood Glucose Monitoring Suppl (TRUE METRIX METER) DEVI 1 each by Does not apply route 3 (three) times daily before meals. 07/25/16   Jaclyn Shaggy, MD  cetirizine (ZYRTEC) 10 MG tablet Take 1 tablet (10 mg total) by mouth daily. 07/25/16   Jaclyn Shaggy, MD  famotidine (PEPCID) 20 MG tablet Take 1 tablet (20 mg total) by mouth daily as needed for heartburn. 11/27/16 12/27/16  Jaclyn Shaggy, MD  FLUoxetine (PROZAC) 20 MG tablet Take 2 tablets (40 mg  total) by mouth daily. 11/06/16   Jaclyn ShaggyEnobong Amao, MD  gabapentin (NEURONTIN) 300 MG capsule Take 1 capsule (300 mg total) by mouth 3 (three) times daily. 11/27/16   Jaclyn ShaggyEnobong Amao, MD  glucose blood (TRUE METRIX BLOOD GLUCOSE TEST) test strip Used daily before meals 07/25/16   Jaclyn ShaggyEnobong Amao, MD  hydrochlorothiazide (HYDRODIURIL) 25 MG tablet Take 1 tablet (25 mg total) by  mouth daily. 10/15/16   Roger Mariana Arnavid Gomez, PA-C  lisinopril (PRINIVIL,ZESTRIL) 10 MG tablet Take 1 tablet (10 mg total) by mouth daily. 11/27/16   Jaclyn ShaggyEnobong Amao, MD  metFORMIN (GLUCOPHAGE) 500 MG tablet Take 2 tablets (1,000 mg total) by mouth 2 (two) times daily with a meal. 11/06/16   Jaclyn ShaggyEnobong Amao, MD  rivaroxaban (XARELTO) 20 MG TABS tablet Take 1 tablet (20 mg total) by mouth daily with supper. 10/15/16   Roger Mariana Arnavid Gomez, PA-C  TRUEPLUS LANCETS 28G MISC Use daily before meals 07/25/16   Jaclyn ShaggyEnobong Amao, MD    Family History Family History  Problem Relation Age of Onset  . Diabetes Mother   . Diabetes Father   . Hypertension Sister     Social History Social History  Substance Use Topics  . Smoking status: Never Smoker  . Smokeless tobacco: Never Used  . Alcohol use 0.6 - 1.2 oz/week    1 - 2 Cans of beer per week     Allergies   No known allergies   Review of Systems Review of Systems  Constitutional: Negative for fever.  Respiratory: Negative for hemoptysis and shortness of breath.   Cardiovascular: Positive for leg swelling (chronic). Negative for syncope.  Gastrointestinal: Positive for vomiting. Negative for abdominal pain.  Musculoskeletal: Positive for myalgias.  Neurological: Positive for headaches.  All other systems reviewed and are negative.   Physical Exam Updated Vital Signs BP 116/72 (BP Location: Right Arm)   Pulse 80   Temp 97.5 F (36.4 C) (Oral)   Resp 16   SpO2 99%   Physical Exam CONSTITUTIONAL: Well developed/well nourished HEAD: Normocephalic/atraumatic EYES: EOMI/PERRL ENMT: Mucous membranes moist NECK: supple no meningeal signs SPINE/BACK:entire spine nontender CV: S1/S2 noted, no murmurs/rubs/gallops noted LUNGS: Lungs are clear to auscultation bilaterally, no apparent distress CHEST: diffuse right sided chest wall tenderness. No crepitus noted.  ABDOMEN: soft, nontender, obese GU:no cva tenderness NEURO: Pt is awake/alert/appropriate,  moves all extremitiesx4.  No facial droop. No arm or leg drift noted,  antalgic gait noted EXTREMITIES: pulses normal/equal, full ROM. Mild tenderness to right knee but no erythema or edema noted. Chronic edema to lower extremities.  SKIN: warm, color normal PSYCH: no abnormalities of mood noted, alert and oriented to situation  ED Treatments / Results  DIAGNOSTIC STUDIES:  Oxygen Saturation is 99% on RA, normal by my interpretation.    COORDINATION OF CARE:  2:25 AM Discussed treatment plan with pt at bedside and pt agreed to plan.  Labs (all labs ordered are listed, but only abnormal results are displayed) Labs Reviewed  BASIC METABOLIC PANEL - Abnormal; Notable for the following:       Result Value   Sodium 134 (*)    Chloride 98 (*)    Glucose, Bld 128 (*)    Creatinine, Ser 1.39 (*)    GFR calc non Af Amer 59 (*)    All other components within normal limits  CBC - Abnormal; Notable for the following:    WBC 14.4 (*)    All other components within normal limits  [facility] SensorI-STAT TROPOININ, ED  EKG  EKG Interpretation  Date/Time:  Wednesday November 28 2016 22:47:56 EDT Ventricular Rate:  92 PR Interval:  86 QRS Duration: 148 QT Interval:  378 QTC Calculation: 467 R Axis:   6 Text Interpretation:  Sinus rhythm with short PR Left ventricular hypertrophy with QRS widening and repolarization abnormality Inferior infarct , age undetermined Abnormal ECG No significant change since last tracing Confirmed by [facility] MD, PEDRO [phone]) on 11/28/2016 10:54:24 PM       Radiology Dg Chest 2 View  Result Date: 11/28/2016 CLINICAL DATA:  46 year old male with right-sided chest pain and cough. EXAM: CHEST  2 VIEW COMPARISON:  Chest radiograph dated 06/05/2016 FINDINGS: There is shallow inspiration. There is no focal consolidation, pleural effusion, or pneumothorax. The cardiac silhouette is within normal limits with no acute osseous pathology identified. IMPRESSION: No active cardiopulmonary  disease. Electronically Signed   By: Elgie Collard M.D.   On: 11/28/2016 23:15    Procedures Procedures (including critical care time)  Medications Ordered in ED Medications  HYDROcodone-acetaminophen (NORCO/VICODIN) 5-325 MG per tablet 1 tablet (1 tablet Oral Given 11/29/16 0303)     Initial Impression / Assessment and Plan / ED Course  I have reviewed the triage vital signs and the nursing notes.  Pertinent labs & imaging results that were available during my care of the patient were reviewed by me and considered in my medical decision making (see chart for details).     Pt well appearing His main issue chest wall pain, worse with palpation.  No crepitus.  CXR negative Doubt ACS/PE at this time  Also reports pain from neck down to legs.  He is ambulatory but has mild Right knee tenderness.  He has chronic edema in legs and known DVT but denies any missed doses of xarelto No signs of any acute emergent condition at this time Will d/c home   Final Clinical Impressions(s) / ED Diagnoses   Final diagnoses:  Intercostal pain  Acute non intractable tension-type headache    New Prescriptions New Prescriptions   No medications on file  I personally performed the services described in this documentation, which was scribed in my presence. The recorded information has been reviewed and is accurate.        David Rhine, MD 11/29/16 [id number]

## 2016-11-29 NOTE — Discharge Instructions (Signed)
Your caregiver has diagnosed you as having chest pain that is not specific for one problem, but does not require admission.  Chest pain comes from many different causes.  °SEEK IMMEDIATE MEDICAL ATTENTION IF: °You have severe chest pain, especially if the pain is crushing or pressure-like and spreads to the arms, back, neck, or jaw, or if you have sweating, nausea (feeling sick to your stomach), or shortness of breath. THIS IS AN EMERGENCY. Don't wait to see if the pain will go away. Get medical help at once. Call 911 or 0 (operator). DO NOT drive yourself to the hospital.  °Your chest pain gets worse and does not go away with rest.  °You have an attack of chest pain lasting longer than usual, despite rest and treatment with the medications your caregiver has prescribed.  °You wake from sleep with chest pain or shortness of breath.  °You feel dizzy or faint.  °You have chest pain not typical of your usual pain for which you originally saw your caregiver. ° °You are having a headache. No specific cause was found today for your headache. It may have been a migraine or other cause of headache. Stress, anxiety, fatigue, and depression are common triggers for headaches. Your headache today does not appear to be life-threatening or require hospitalization, but often the exact cause of headaches is not determined in the emergency department. Therefore, follow-up with your doctor is very important to find out what may have caused your headache, and whether or not you need any further diagnostic testing or treatment. Sometimes headaches can appear benign (not harmful), but then more serious symptoms can develop which should prompt an immediate re-evaluation by your doctor or the emergency department. ° °SEEK MEDICAL ATTENTION IF: ° °You develop possible problems with medications prescribed.  °The medications don't resolve your headache, if it recurs , or if you have multiple episodes of vomiting or can't take fluids. °You  have a change from the usual headache. ° °RETURN IMMEDIATELY IF you develop a sudden, severe headache or confusion, become poorly responsive or faint, develop a fever above 100.4F or problem breathing, have a change in speech, vision, swallowing, or understanding, or develop new weakness, numbness, tingling, incoordination, or have a seizure. ° °

## 2016-12-13 ENCOUNTER — Ambulatory Visit: Payer: Self-pay | Attending: Family Medicine | Admitting: Family Medicine

## 2016-12-13 ENCOUNTER — Encounter: Payer: Self-pay | Admitting: Family Medicine

## 2016-12-13 VITALS — BP 137/87 | HR 74 | Temp 98.9°F | Ht 69.5 in | Wt 293.2 lb

## 2016-12-13 DIAGNOSIS — Z7984 Long term (current) use of oral hypoglycemic drugs: Secondary | ICD-10-CM | POA: Insufficient documentation

## 2016-12-13 DIAGNOSIS — F329 Major depressive disorder, single episode, unspecified: Secondary | ICD-10-CM | POA: Insufficient documentation

## 2016-12-13 DIAGNOSIS — Z79899 Other long term (current) drug therapy: Secondary | ICD-10-CM | POA: Insufficient documentation

## 2016-12-13 DIAGNOSIS — I10 Essential (primary) hypertension: Secondary | ICD-10-CM | POA: Insufficient documentation

## 2016-12-13 DIAGNOSIS — F321 Major depressive disorder, single episode, moderate: Secondary | ICD-10-CM

## 2016-12-13 DIAGNOSIS — Z9111 Patient's noncompliance with dietary regimen: Secondary | ICD-10-CM | POA: Insufficient documentation

## 2016-12-13 DIAGNOSIS — Z86711 Personal history of pulmonary embolism: Secondary | ICD-10-CM | POA: Insufficient documentation

## 2016-12-13 DIAGNOSIS — Z86718 Personal history of other venous thrombosis and embolism: Secondary | ICD-10-CM | POA: Insufficient documentation

## 2016-12-13 DIAGNOSIS — E119 Type 2 diabetes mellitus without complications: Secondary | ICD-10-CM | POA: Insufficient documentation

## 2016-12-13 DIAGNOSIS — Z7901 Long term (current) use of anticoagulants: Secondary | ICD-10-CM | POA: Insufficient documentation

## 2016-12-13 DIAGNOSIS — M170 Bilateral primary osteoarthritis of knee: Secondary | ICD-10-CM | POA: Insufficient documentation

## 2016-12-13 LAB — GLUCOSE, POCT (MANUAL RESULT ENTRY): POC Glucose: 302 mg/dl — AB (ref 70–99)

## 2016-12-13 NOTE — Telephone Encounter (Signed)
Pt. Came to the facility requesting to be referred to Orthopedic Surgery. Pt. Has the been approved for CAFA. Please f/u.

## 2016-12-13 NOTE — Progress Notes (Signed)
Subjective:  Patient ID: David Dunlap, male    DOB: 1971-08-01  Age: 46 y.o. MRN: [id number]  CC: Knee Pain and Diabetes   HPI David MikeKeith D Kita is a  [age] year old male with a history of type 2 diabetes mellitus (A1c 7.6 which has trended up from 6.3 previously) hypertension, chronic PE, depression who presents today for follow-up visit.  He endorses compliance with his medications but not with exercise or a diabetic diet. Denies side effects from medications.  He does have bilateral knee pain and x-rays revealed age advanced osteoarthritis of the right knee and mild osteoarthritis of the left knee. Knee pain is associated with swelling R>L.Tylenol 3 does not help much He [facility]  [facility] and will need a referral to [facility]opedic.  His dose of Prozac was increased from 20 mg to 40 mg reports slight improvement however he does still have some residual depression; no suicidal ideation or intent.  Past Medical History:  Diagnosis Date  . Depression   . Diabetes mellitus without complication (HCC) 05/2016   NEW ONSET    WITH FAMILY HISTORY  . DVT (deep venous thrombosis) (HCC)   . Essential hypertension   . Gallstones 05/2016    Past Surgical History:  Procedure Laterality Date  . CHOLECYSTECTOMY N/A 06/07/2016   Procedure: LAPAROSCOPIC CHOLECYSTECTOMY WITH ATTEMPTED INTRAOPERATIVE CHOLANGIOGRAM;  Surgeon: Violeta GelinasBurke Thompson, MD;  Location: [facility] OR;  Service: General;  Laterality: N/A;  . LIGAMENT REPAIR     R forearm    Allergies  Allergen Reactions  . No Known Allergies      Outpatient Medications Prior to Visit  Medication Sig Dispense Refill  . acetaminophen-codeine (TYLENOL #3) 300-30 MG tablet Take 1 tablet by mouth every 12 (twelve) hours as needed for moderate pain. 50 tablet 1  . Blood Glucose Monitoring Suppl (TRUE METRIX METER) DEVI 1 each by Does not apply route 3 (three) times daily before meals. 1 Device 0  . cetirizine (ZYRTEC) 10 MG tablet  Take 1 tablet (10 mg total) by mouth daily. 30 tablet 1  . famotidine (PEPCID) 20 MG tablet Take 1 tablet (20 mg total) by mouth daily as needed for heartburn. 30 tablet 3  . FLUoxetine (PROZAC) 20 MG tablet Take 2 tablets (40 mg total) by mouth daily. 60 tablet 3  . gabapentin (NEURONTIN) 300 MG capsule Take 1 capsule (300 mg total) by mouth 3 (three) times daily. 90 capsule 3  . glucose blood (TRUE METRIX BLOOD GLUCOSE TEST) test strip Used daily before meals 100 each 12  . hydrochlorothiazide (HYDRODIURIL) 25 MG tablet Take 1 tablet (25 mg total) by mouth daily. 30 tablet 3  . lisinopril (PRINIVIL,ZESTRIL) 10 MG tablet Take 1 tablet (10 mg total) by mouth daily. 30 tablet 3  . metFORMIN (GLUCOPHAGE) 500 MG tablet Take 2 tablets (1,000 mg total) by mouth 2 (two) times daily with a meal. 120 tablet 3  . rivaroxaban (XARELTO) 20 MG TABS tablet Take 1 tablet (20 mg total) by mouth daily with supper. 90 tablet 3  . TRUEPLUS LANCETS 28G MISC Use daily before meals 30 each 12   No facility-administered medications prior to visit.     ROS Review of Systems Constitutional: Negative for activity change and appetite change.  HENT: Negative for sinus pressure and sore throat.   Eyes: Negative for visual disturbance.  Respiratory: Negative for cough, chest tightness and shortness of breath.   Cardiovascular: Negative for chest pain and leg swelling.  Gastrointestinal: Negative for abdominal distention, abdominal pain, constipation and diarrhea.  Endocrine: Negative.   Genitourinary: Negative for dysuria.  Musculoskeletal:       See hpi  Skin: Negative for rash.  Allergic/Immunologic: Negative.   Neurological: Negative for weakness, light-headedness and numbness.  Psychiatric/Behavioral: positive for dysphoric mood and negative for suicidal ideas.  Objective:  BP 137/87 (BP Location: Right Arm, Patient Position: Sitting, Cuff Size: Large)   Pulse 74   Temp 98.9 F (37.2 C) (Oral)   Ht 5'  9.5" (1.765 m)   Wt 293 lb 3.2 oz (133 kg)   SpO2 99%   BMI 42.68 kg/m   BP/Weight 12/13/2016 11/29/2016 11/27/2016  Systolic BP 137 117 138  Diastolic BP 87 77 87  Wt. (Lbs) 293.2 - 299.2  BMI 42.68 - 43.55      Physical Exam Constitutional: He is oriented to person, place, and time. He appears well-developed and well-nourished.  Cardiovascular: Normal rate, normal heart sounds and intact distal pulses.   No murmur heard. Pulmonary/Chest: Effort normal and breath sounds normal. He has no wheezes. He has no rales. He exhibits no tenderness.  Abdominal: Soft. Bowel sounds are normal. He exhibits no distension and no mass. There is no tenderness.  Musculoskeletal: He exhibits tenderness and crepitus(bilateral tenderness on ROM of knees). He exhibits no edema.  Neurological: He is alert and oriented to person, place, and time.  Skin: Skin is warm.  Psychiatric: He has a normal mood and affect  Lab Results  Component Value Date   HGBA1C 7.6 11/06/2016    Assessment & Plan:   1. Diabetes mellitus without complication (HCC) Not fully optimized with A1c of 7.6 Blood sugar of 302- less than 1 hour postprandial Noncompliant with diabetic diet Emphasized healthy food choices, lifestyle modifications - Glucose (CBG)  2. Primary osteoarthritis of both knees Continue Tylenol 3 - Ambulatory referral to Orthopedic Surgery  3. Depression Improved slightly Prozac was just increased 1 month ago We'll need to allow some more time and reassess for complete improvement in next visit  No orders of the defined types were placed in this encounter.   Follow-up: Return in about 3 months (around 03/15/2017) for Follow-up on chronic medical conditions.Jaclyn Shaggy MD

## 2016-12-13 NOTE — Progress Notes (Signed)
Med refills- prozac, HCTZ. Lisinopril, tylenol #3 Was approved [facility] [place] [facility]- referral to ortho

## 2016-12-13 NOTE — Patient Instructions (Signed)

## 2016-12-17 MED FILL — FLUoxetine HCL 20 MG CAPS: 20 | 30 days supply | Qty: 60 | Fill #1

## 2016-12-17 MED FILL — metFORMIN HCL 500 MG TABS: 500 | 30 days supply | Qty: 120 | Fill #1

## 2016-12-21 NOTE — Telephone Encounter (Signed)
Patient already received a call from ortho and has an appt on 12/28/16.

## 2016-12-21 NOTE — Telephone Encounter (Signed)
Ortho referral placed.   Please inform patient.

## 2016-12-28 ENCOUNTER — Ambulatory Visit (INDEPENDENT_AMBULATORY_CARE_PROVIDER_SITE_OTHER): Payer: Self-pay | Admitting: Orthopaedic Surgery

## 2016-12-28 ENCOUNTER — Encounter (INDEPENDENT_AMBULATORY_CARE_PROVIDER_SITE_OTHER): Payer: Self-pay | Admitting: Orthopaedic Surgery

## 2016-12-28 VITALS — Ht 69.5 in | Wt 293.0 lb

## 2016-12-28 DIAGNOSIS — M1711 Unilateral primary osteoarthritis, right knee: Secondary | ICD-10-CM

## 2016-12-28 NOTE — Progress Notes (Addendum)
Office Visit Note   Patient: David Dunlap           Date of Birth: 1971-05-25           MRN: [id number] Visit Date: 12/28/2016              Requested by: Jaclyn Shaggy, MD [address] [place], [place] [address] PCP: Jaclyn Shaggy, MD   Assessment & Plan: Visit Diagnoses:  1. Unilateral primary osteoarthritis, right knee     Plan: Patient's x-rays which showed advanced degenerative joint disease worse on the right were discussed and reviewed with the patient and his wife. Patient has failed extensive conservative treatment at this point he is considering a total knee replacement. However we did speak at length about his multiple risk factors including chronic DVTs and obesity. Ice encouraged him to lose the weight first before considering total knee replacement but the patient understands the increased risks associated with his risk factors and is willing to accept the risks. He wants to proceed with total knee replacement. From a soft tissue standpoint his right leg is quite reasonable. Most of his weight is truncal. We will put him back on xarelto postoperatively. He will need to stop his relative 2-3 days beforehand. Questions encouraged and answered. We will schedule him in the near future.  Follow-Up Instructions: Return if symptoms worsen or fail to improve.   Orders:  No orders of the defined types were placed in this encounter.  No orders of the defined types were placed in this encounter.     Procedures: No procedures performed   Clinical Data: No additional findings.   Subjective: Chief Complaint  Patient presents with  . Right Knee - Pain  . Left Knee - Pain    Patient is a 45 year old gentleman with a BMI 42 comes in with bilateral knee pain that is worse on the right. He's had pain for over a year with significant giving way, swelling, popping that has significantly affected his quality of life and now he is having difficulty with ADLs. He has had  cortisone shots in the past with minimal relief. Tylenol No. 3 gives temporary relief. He is a well-controlled diabetic but he does have chronic DVTs which she is on xarelto 20 mg daily for. He is a nonsmoker. He is currently unemployed. The pain does not radiate. Denies any constitutional symptoms.    Review of Systems  Constitutional: Negative.   All other systems reviewed and are negative.    Objective: Vital Signs: Ht 5' 9.5" (1.765 m)   Wt 293 lb (132.9 kg)   BMI 42.65 kg/m   Physical Exam  Constitutional: He is oriented to person, place, and time. He appears well-developed and well-nourished.  HENT:  Head: Normocephalic and atraumatic.  Eyes: Pupils are equal, round, and reactive to light.  Neck: Neck supple.  Pulmonary/Chest: Effort normal.  Abdominal: Soft.  Musculoskeletal: Normal range of motion.  Neurological: He is alert and oriented to person, place, and time.  Skin: Skin is warm.  Psychiatric: He has a normal mood and affect. His behavior is normal. Judgment and thought content normal.  Nursing note and vitals reviewed.   Ortho Exam Right knee exam shows a mild varus deformity. Range of motion is normal. Collaterals and cruciates are stable. No joint effusion  Specialty Comments:  No specialty comments available.  Imaging: No results found.   PMFS History: Patient Active Problem List   Diagnosis Date Noted  . GERD (gastroesophageal  reflux disease) 11/27/2016  . Unilateral primary osteoarthritis, right knee 10/18/2016  . Neuropathy (HCC) 07/25/2016  . Acute cholecystitis 06/05/2016  . Diabetes mellitus without complication (HCC) 05/18/2016  . Cholelithiasis 04/09/2016  . Major depressive disorder, single episode 04/09/2016  . Leg DVT (deep venous thromboembolism), chronic (HCC) 12/08/2014  . Family history of diabetes mellitus (DM) 12/08/2014  . DVT, femoral, acute (HCC) 03/22/2014  . Moderate major depression, single episode (HCC) 08/22/2011  .  Constipation 11/22/2010  . ANKLE EDEMA 10/19/2010  . Essential hypertension 10/12/2010   Past Medical History:  Diagnosis Date  . Depression   . Diabetes mellitus without complication (HCC) 05/2016   NEW ONSET    WITH FAMILY HISTORY  . DVT (deep venous thrombosis) (HCC)   . Essential hypertension   . Gallstones 05/2016    Family History  Problem Relation Age of Onset  . Diabetes Mother   . Diabetes Father   . Hypertension Sister     Past Surgical History:  Procedure Laterality Date  . CHOLECYSTECTOMY N/A 06/07/2016   Procedure: LAPAROSCOPIC CHOLECYSTECTOMY WITH ATTEMPTED INTRAOPERATIVE CHOLANGIOGRAM;  Surgeon: Violeta Gelinas, MD;  Location: [facility] OR;  Service: General;  Laterality: N/A;  . LIGAMENT REPAIR     R forearm   Social History   Occupational History  . Not on file.   Social History Main Topics  . Smoking status: Never Smoker  . Smokeless tobacco: Never Used  . Alcohol use 0.6 - 1.2 oz/week    1 - 2 Cans of beer per week  . Drug use: No     Comment: last time 3 months ago  . Sexual activity: Not on file

## 2017-01-04 MED FILL — ?LISINOPRIL 10 MG TABLET: 10 | 30 days supply | Qty: 30 | Fill #1

## 2017-01-04 MED FILL — FAMOTIDINE 20 MG TABLET: 20 | 30 days supply | Qty: 30 | Fill #1

## 2017-01-04 MED FILL — ACETAMINOPHEN/COD #3 TABLET: 300-30 | 25 days supply | Qty: 50 | Fill #1

## 2017-01-04 MED FILL — HYDROCHLOROTHIAZIDE 25 MG T: 25 | 30 days supply | Qty: 30 | Fill #2

## 2017-01-04 MED FILL — XARELTO 20 MG TABLET: 20 | 5 days supply | Qty: 5 | Fill #2

## 2017-01-08 ENCOUNTER — Telehealth: Payer: Self-pay

## 2017-01-08 ENCOUNTER — Ambulatory Visit: Payer: Self-pay | Admitting: Family Medicine

## 2017-01-08 ENCOUNTER — Other Ambulatory Visit: Payer: Self-pay

## 2017-01-08 DIAGNOSIS — Z760 Encounter for issue of repeat prescription: Secondary | ICD-10-CM

## 2017-01-08 DIAGNOSIS — I82419 Acute embolism and thrombosis of unspecified femoral vein: Secondary | ICD-10-CM

## 2017-01-08 DIAGNOSIS — I82509 Chronic embolism and thrombosis of unspecified deep veins of unspecified lower extremity: Secondary | ICD-10-CM

## 2017-01-08 MED ORDER — RIVAROXABAN 20 MG PO TABS: 20.0000 mg | ORAL_TABLET | Freq: Every day | ORAL | 3 refills | Status: DC

## 2017-01-18 ENCOUNTER — Other Ambulatory Visit: Payer: Self-pay | Admitting: Family Medicine

## 2017-01-18 DIAGNOSIS — Z01810 Encounter for preprocedural cardiovascular examination: Secondary | ICD-10-CM

## 2017-01-18 NOTE — Progress Notes (Unsigned)
Received preop consultation from [facility] for clearance for right total knee arthroplasty. Diabetes is stable however he does have inferolateral ST changes on EKG suggestive of ischemia. Will need cardiology evaluation; I have placed referral.

## 2017-01-29 ENCOUNTER — Encounter: Payer: Self-pay | Admitting: Physician Assistant

## 2017-01-29 NOTE — Progress Notes (Deleted)
Cardiology Office Note    Date:  01/29/2017   ID:  David Dunlap, DOB 11/20/1970, MRN [id number]  PCP:  Jaclyn Shaggy, MD  Cardiologist:  New to   Chief Complaint: Pre-operative clearance for R total knee replacement   History of Present Illness:   David Dunlap is a 46 y.o. 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 Roda Shutters [facility]).      Past Medical History:  Diagnosis Date  . Depression   . Diabetes mellitus without complication (HCC) 05/2016   NEW ONSET    WITH FAMILY HISTORY  . DVT (deep venous thrombosis) (HCC)   . Essential hypertension   . Gallstones 05/2016    Past Surgical History:  Procedure Laterality Date  . CHOLECYSTECTOMY N/A 06/07/2016   Procedure: LAPAROSCOPIC CHOLECYSTECTOMY WITH ATTEMPTED INTRAOPERATIVE CHOLANGIOGRAM;  Surgeon: Violeta Gelinas, MD;  Location: [facility] OR;  Service: General;  Laterality: N/A;  . LIGAMENT REPAIR     R forearm    Current Medications: Prior to Admission medications   Medication Sig Start Date End Date Taking? Authorizing Provider  acetaminophen-codeine (TYLENOL #3) 300-30 MG tablet Take 1 tablet by mouth every 12 (twelve) hours as needed for moderate pain. 11/27/16   Jaclyn Shaggy, MD  Blood Glucose Monitoring Suppl (TRUE METRIX METER) DEVI 1 each by Does not apply route 3 (three) times daily before meals. 07/25/16   Jaclyn Shaggy, MD  cetirizine (ZYRTEC) 10 MG tablet Take 1 tablet (10 mg total) by mouth daily. 07/25/16   Jaclyn Shaggy, MD  famotidine (PEPCID) 20 MG tablet Take 1 tablet (20 mg total) by mouth daily as needed for heartburn. 11/27/16 12/27/16  Jaclyn Shaggy, MD  FLUoxetine (PROZAC) 20 MG capsule  12/13/16   [provider]  FLUoxetine (PROZAC) 20 MG tablet Take 2 tablets (40 mg total) by mouth daily. 11/06/16   Jaclyn Shaggy, MD  gabapentin (NEURONTIN) 300 MG capsule Take 1 capsule  (300 mg total) by mouth 3 (three) times daily. 11/27/16   Jaclyn Shaggy, MD  glucose blood (TRUE METRIX BLOOD GLUCOSE TEST) test strip Used daily before meals 07/25/16   Jaclyn Shaggy, MD  hydrochlorothiazide (HYDRODIURIL) 25 MG tablet Take 1 tablet (25 mg total) by mouth daily. 10/15/16   Loletta Specter, PA-C  lisinopril (PRINIVIL,ZESTRIL) 10 MG tablet Take 1 tablet (10 mg total) by mouth daily. 11/27/16   Jaclyn Shaggy, MD  metFORMIN (GLUCOPHAGE) 500 MG tablet Take 2 tablets (1,000 mg total) by mouth 2 (two) times daily with a meal. 11/06/16   Jaclyn Shaggy, MD  rivaroxaban (XARELTO) 20 MG TABS tablet Take 1 tablet (20 mg total) by mouth daily with supper. 01/08/17   Jaclyn Shaggy, MD  TRUEPLUS LANCETS 28G MISC Use daily before meals 07/25/16   Jaclyn Shaggy, MD    Allergies:   No known allergies   Social History   Social History  . Marital status: Single    Spouse name: N/A  . Number of children: N/A  . Years of education: N/A   Social History Main Topics  . Smoking status: Never Smoker  . Smokeless tobacco: Never Used  . Alcohol use 0.6 - 1.2 oz/week    1 - 2 Cans of beer per week  . Drug use: No     Comment: last time 3 months ago  . Sexual activity: Not on file   Other Topics Concern  .  Not on file   Social History Narrative  . No narrative on file     Family History:  The patient's family history includes Diabetes in his father and mother; Hypertension in his sister. ***  ROS:   Please see the history of present illness.    ROS All other systems reviewed and are negative.   PHYSICAL EXAM:   VS:  There were no vitals taken for this visit.   GEN: Well nourished, well developed, in no acute distress  HEENT: normal  Neck: no JVD, carotid bruits, or masses Cardiac: ***RRR; no murmurs, rubs, or gallops,no edema  Respiratory:  clear to auscultation bilaterally, normal work of breathing GI: soft, nontender, nondistended, + BS MS: no deformity or atrophy  Skin: warm and  dry, no rash Neuro:  Alert and Oriented x 3, Strength and sensation are intact Psych: euthymic mood, full affect  Wt Readings from Last 3 Encounters:  12/28/16 293 lb (132.9 kg)  12/13/16 293 lb 3.2 oz (133 kg)  11/27/16 299 lb 3.2 oz (135.7 kg)      Studies/Labs Reviewed:   EKG:  EKG is ordered today.  The ekg ordered today demonstrates ***  Recent Labs: 06/07/2016: ALT 50 11/28/2016: BUN 13; Creatinine, Ser 1.39; Hemoglobin 15.1; Platelets 183; Potassium 4.2; Sodium 134   Lipid Panel    Component Value Date/Time   CHOL 182 12/08/2014 0942   TRIG 121 12/08/2014 0942   HDL 40 12/08/2014 0942   CHOLHDL 4.6 12/08/2014 0942   VLDL 24 12/08/2014 0942   LDLCALC 118 (H) 12/08/2014 0942    Additional studies/ records that were reviewed today include:   AS above    ASSESSMENT & PLAN:    1. Pre-operative cardiac clearance.     Medication Adjustments/Labs and Tests Ordered: Current medicines are reviewed at length with the patient today.  Concerns regarding medicines are outlined above.  Medication changes, Labs and Tests ordered today are listed in the Patient Instructions below. There are no Patient Instructions on file for this visit.   Lorelei PontSigned, Darria Corvera, [place]  01/29/2017 2:58 PM    [facility] [address], [place], [place]  [address] Phone: [phone]; Fax: [phone]

## 2017-01-30 ENCOUNTER — Ambulatory Visit: Payer: Self-pay | Admitting: Physician Assistant

## 2017-02-13 NOTE — Progress Notes (Signed)
Cardiology Office Note    Date:  02/14/2017   ID:  David Dunlap, DOB [birth year], MRN [id number]  PCP:  Jaclyn Shaggy, MD  Cardiologist:  New to Dr. Eden Emms   Chief Complaint:Pre-operative clearance for R total knee replacement   History of Present Illness:   David Dunlap is a 46 y.o. 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 Roda Shutters [facility]).   He denies any prior hx of MI or stroke. The patient denies nausea, vomiting, fever, chest pain, palpitations, shortness of breath, orthopnea, PND, dizziness, syncope, cough, congestion, abdominal pain, hematochezia or melena. He does has mild LE edema due to chronic DVT. Limited ambulation due to chronic knee issue. Non smoker. Social drinker. No family hx of CAD, MI or stroke.   Last A1c 7.6 - 11/06/16   Past Medical History:  Diagnosis Date  . Depression   . Diabetes mellitus without complication (HCC) 05/2016   NEW ONSET    WITH FAMILY HISTORY  . DVT (deep venous thrombosis) (HCC)   . Essential hypertension   . Gallstones 05/2016    Past Surgical History:  Procedure Laterality Date  . CHOLECYSTECTOMY N/A 06/07/2016   Procedure: LAPAROSCOPIC CHOLECYSTECTOMY WITH ATTEMPTED INTRAOPERATIVE CHOLANGIOGRAM;  Surgeon: Violeta Gelinas, MD;  Location: [facility] OR;  Service: General;  Laterality: N/A;  . LIGAMENT REPAIR     R forearm    Current Medications: Prior to Admission medications   Medication Sig Start Date End Date Taking? Authorizing Provider  acetaminophen-codeine (TYLENOL #3) 300-30 MG tablet Take 1 tablet by mouth every 12 (twelve) hours as needed for moderate pain. 11/27/16   Jaclyn Shaggy, MD  Blood Glucose Monitoring Suppl (TRUE METRIX METER) DEVI 1 each by Does not apply route 3 (three) times daily before meals. 07/25/16   Jaclyn Shaggy, MD  cetirizine (ZYRTEC) 10 MG tablet Take 1 tablet (10 mg total)  by mouth daily. 07/25/16   Jaclyn Shaggy, MD  famotidine (PEPCID) 20 MG tablet Take 1 tablet (20 mg total) by mouth daily as needed for heartburn. 11/27/16 12/27/16  Jaclyn Shaggy, MD  FLUoxetine (PROZAC) 20 MG capsule  12/13/16   [provider]  FLUoxetine (PROZAC) 20 MG tablet Take 2 tablets (40 mg total) by mouth daily. 11/06/16   Jaclyn Shaggy, MD  gabapentin (NEURONTIN) 300 MG capsule Take 1 capsule (300 mg total) by mouth 3 (three) times daily. 11/27/16   Jaclyn Shaggy, MD  glucose blood (TRUE METRIX BLOOD GLUCOSE TEST) test strip Used daily before meals 07/25/16   Jaclyn Shaggy, MD  hydrochlorothiazide (HYDRODIURIL) 25 MG tablet Take 1 tablet (25 mg total) by mouth daily. 10/15/16   Loletta Specter, PA-C  lisinopril (PRINIVIL,ZESTRIL) 10 MG tablet Take 1 tablet (10 mg total) by mouth daily. 11/27/16   Jaclyn Shaggy, MD  metFORMIN (GLUCOPHAGE) 500 MG tablet Take 2 tablets (1,000 mg total) by mouth 2 (two) times daily with a meal. 11/06/16   Jaclyn Shaggy, MD  rivaroxaban (XARELTO) 20 MG TABS tablet Take 1 tablet (20 mg total) by mouth daily with supper. 01/08/17   Jaclyn Shaggy, MD  TRUEPLUS LANCETS 28G MISC Use daily before meals 07/25/16   Jaclyn Shaggy, MD    Allergies:   No known allergies   Social History   Social History  . Marital status: Single    Spouse name: N/A  . Number of children: N/A  .  Years of education: N/A   Social History Main Topics  . Smoking status: Never Smoker  . Smokeless tobacco: Never Used  . Alcohol use 0.6 - 1.2 oz/week    1 - 2 Cans of beer per week  . Drug use: No     Comment: last time 3 months ago  . Sexual activity: Not on file   Other Topics Concern  . Not on file   Social History Narrative  . No narrative on file     Family History:  The patient's family history includes Diabetes in his father and mother; Hypertension in his sister.   ROS:   Please see the history of present illness.    ROS All other systems reviewed and are  negative.   PHYSICAL EXAM:   VS:  BP 132/90 (BP Location: Left Arm)   Pulse (!) 109   Ht 5' 9.5" (1.765 m)   Wt 292 lb 6.4 oz (132.6 kg)   BMI 42.56 kg/m    GEN: Well nourished, well developed, in no acute distress  HEENT: normal  Neck: no JVD, carotid bruits, or masses Cardiac: RR with tachycardia; no murmurs, rubs, or gallops, trace BL LE  edema  Respiratory:  clear to auscultation bilaterally, normal work of breathing GI: soft, nontender, nondistended, + BS MS: no deformity or atrophy  Skin: warm and dry, no rash Neuro:  Alert and Oriented x 3, Strength and sensation are intact Psych: euthymic mood, full affect  Wt Readings from Last 3 Encounters:  02/14/17 292 lb 6.4 oz (132.6 kg)  12/28/16 293 lb (132.9 kg)  12/13/16 293 lb 3.2 oz (133 kg)      Studies/Labs Reviewed:   EKG:  EKG is ordered today.  The ekg ordered today demonstrates sinus tachycardia at rate of 109 bpm  Recent Labs: 06/07/2016: ALT 50 11/28/2016: BUN 13; Creatinine, Ser 1.39; Hemoglobin 15.1; Platelets 183; Potassium 4.2; Sodium 134   Lipid Panel    Component Value Date/Time   CHOL 182 12/08/2014 0942   TRIG 121 12/08/2014 0942   HDL 40 12/08/2014 0942   CHOLHDL 4.6 12/08/2014 0942   VLDL 24 12/08/2014 0942   LDLCALC 118 (H) 12/08/2014 0942    Additional studies/ records that were reviewed today include:   As above  ASSESSMENT & PLAN:    1. Pre-operative cardiac clearance.  - No prior hx of MI or stroke. No angina or sign of CHF. Will get echocardiogram given sinus tachycardia and preop clearance to rule out any structural or valvular abnormality.  2. Sinus tachycardia - He denies palpitation, dizziness, syncope, chest pain or shortness of breath. Check TSH.  3. HTN - Stable and well controlled on current medications.     Medication Adjustments/Labs and Tests Ordered: Current medicines are reviewed at length with the patient today.  Concerns regarding medicines are outlined above.   Medication changes, Labs and Tests ordered today are listed in the Patient Instructions below. Patient Instructions  Medication Instructions:  Your physician recommends that you continue on your current medications as directed. Please refer to the Current Medication list given to you today.   Labwork: TODAY:  TSH  Testing/Procedures: Your physician has requested that you have an echocardiogram. Echocardiography is a painless test that uses sound waves to create images of your heart. It provides your doctor with information about the size and shape of your heart and how well your heart's chambers and valves are working. This procedure takes approximately one hour. There are no  restrictions for this procedure.     Follow-Up: Your physician recommends that you schedule a follow-up appointment in: DEPENDING UPON TEST RESULTS   Any Other Special Instructions Will Be Listed Below (If Applicable).  Echocardiogram An echocardiogram, or echocardiography, uses sound waves (ultrasound) to produce an image of your heart. The echocardiogram is simple, painless, obtained within a short period of time, and offers valuable information to your health care provider. The images from an echocardiogram can provide information such as:  Evidence of coronary artery disease (CAD).  Heart size.  Heart muscle function.  Heart valve function.  Aneurysm detection.  Evidence of a past heart attack.  Fluid buildup around the heart.  Heart muscle thickening.  Assess heart valve function.  Tell a health care provider about:  Any allergies you have.  All medicines you are taking, including vitamins, herbs, eye drops, creams, and over-the-counter medicines.  Any problems you or family members have had with anesthetic medicines.  Any blood disorders you have.  Any surgeries you have had.  Any medical conditions you have.  Whether you are pregnant or may be pregnant. What happens before the  procedure? No special preparation is needed. Eat and drink normally. What happens during the procedure?  In order to produce an image of your heart, gel will be applied to your chest and a wand-like tool (transducer) will be moved over your chest. The gel will help transmit the sound waves from the transducer. The sound waves will harmlessly bounce off your heart to allow the heart images to be captured in real-time motion. These images will then be recorded.  You may need an IV to receive a medicine that improves the quality of the pictures. What happens after the procedure? You may return to your normal schedule including diet, activities, and medicines, unless your health care provider tells you otherwise. This information is not intended to replace advice given to you by your health care provider. Make sure you discuss any questions you have with your health care provider. Document Released: 08/31/2000 Document Revised: 04/21/2016 Document Reviewed: 05/11/2013 Elsevier Interactive Patient Education  2017 [employer]   If you need a refill on your cardiac medications before your next appointment, please call your pharmacy.      Lorelei PontSigned, Shayona Hibbitts, [place]  02/14/2017 3:40 PM    [facility] [address], [place], [place]  [address] Phone: [phone]; Fax: [phone]

## 2017-02-14 ENCOUNTER — Ambulatory Visit (INDEPENDENT_AMBULATORY_CARE_PROVIDER_SITE_OTHER): Payer: Self-pay | Admitting: Physician Assistant

## 2017-02-14 VITALS — BP 132/90 | HR 109 | Ht 69.5 in | Wt 292.4 lb

## 2017-02-14 DIAGNOSIS — I10 Essential (primary) hypertension: Secondary | ICD-10-CM

## 2017-02-14 DIAGNOSIS — R000 Tachycardia, unspecified: Secondary | ICD-10-CM

## 2017-02-14 DIAGNOSIS — Z01818 Encounter for other preprocedural examination: Secondary | ICD-10-CM

## 2017-02-14 NOTE — Patient Instructions (Signed)
Medication Instructions:  Your physician recommends that you continue on your current medications as directed. Please refer to the Current Medication list given to you today.   Labwork: TODAY:  TSH  Testing/Procedures: Your physician has requested that you have an echocardiogram. Echocardiography is a painless test that uses sound waves to create images of your heart. It provides your doctor with information about the size and shape of your heart and how well your heart's chambers and valves are working. This procedure takes approximately one hour. There are no restrictions for this procedure.     Follow-Up: Your physician recommends that you schedule a follow-up appointment in: DEPENDING UPON TEST RESULTS   Any Other Special Instructions Will Be Listed Below (If Applicable).  Echocardiogram An echocardiogram, or echocardiography, uses sound waves (ultrasound) to produce an image of your heart. The echocardiogram is simple, painless, obtained within a short period of time, and offers valuable information to your health care provider. The images from an echocardiogram can provide information such as:  Evidence of coronary artery disease (CAD).  Heart size.  Heart muscle function.  Heart valve function.  Aneurysm detection.  Evidence of a past heart attack.  Fluid buildup around the heart.  Heart muscle thickening.  Assess heart valve function.  Tell a health care provider about:  Any allergies you have.  All medicines you are taking, including vitamins, herbs, eye drops, creams, and over-the-counter medicines.  Any problems you or family members have had with anesthetic medicines.  Any blood disorders you have.  Any surgeries you have had.  Any medical conditions you have.  Whether you are pregnant or may be pregnant. What happens before the procedure? No special preparation is needed. Eat and drink normally. What happens during the procedure?  In order to  produce an image of your heart, gel will be applied to your chest and a wand-like tool (transducer) will be moved over your chest. The gel will help transmit the sound waves from the transducer. The sound waves will harmlessly bounce off your heart to allow the heart images to be captured in real-time motion. These images will then be recorded.  You may need an IV to receive a medicine that improves the quality of the pictures. What happens after the procedure? You may return to your normal schedule including diet, activities, and medicines, unless your health care provider tells you otherwise. This information is not intended to replace advice given to you by your health care provider. Make sure you discuss any questions you have with your health care provider. Document Released: 08/31/2000 Document Revised: 04/21/2016 Document Reviewed: 05/11/2013 Elsevier Interactive Patient Education  2017 [employer]   If you need a refill on your cardiac medications before your next appointment, please call your pharmacy.

## 2017-02-15 LAB — TSH: TSH: 1.19 u[IU]/mL (ref 0.450–4.500)

## 2017-02-26 ENCOUNTER — Other Ambulatory Visit (HOSPITAL_COMMUNITY): Payer: Self-pay

## 2017-03-05 MED FILL — TRUEplus LANCETS 28G MISC: 25 days supply | Qty: 100 | Fill #1

## 2017-03-05 MED FILL — TRUE METRIX TEST STRIP: 25 days supply | Qty: 100 | Fill #1

## 2017-03-08 ENCOUNTER — Other Ambulatory Visit: Payer: Self-pay | Admitting: Physician Assistant

## 2017-03-08 DIAGNOSIS — Z760 Encounter for issue of repeat prescription: Secondary | ICD-10-CM

## 2017-03-08 MED FILL — LISINOPRIL 10 MG TABLET: 10 | 30 days supply | Qty: 30 | Fill #2

## 2017-03-08 MED FILL — ?METFORMIN HCL 500MG TABLET: 500 | 30 days supply | Qty: 120 | Fill #2

## 2017-03-08 MED FILL — FAMOTIDINE 20 MG TABLET: 20 | 30 days supply | Qty: 30 | Fill #2

## 2017-03-08 MED FILL — XARELTO 20 MG TABLET: 20 | 5 days supply | Qty: 5 | Fill #3

## 2017-03-08 MED FILL — ?FLUOXETINE HCL 20 MG CAP: 20 | 30 days supply | Qty: 60 | Fill #2

## 2017-03-08 MED FILL — GABAPENTIN 300 MG CAPSULE: 300 | 30 days supply | Qty: 90 | Fill #1

## 2017-03-08 MED FILL — HYDROCHLOROTHIAZIDE 25 MG T: 25 | 30 days supply | Qty: 30 | Fill #3

## 2017-03-08 NOTE — Telephone Encounter (Signed)
Error

## 2017-03-11 ENCOUNTER — Encounter: Payer: Self-pay | Admitting: Family Medicine

## 2017-03-11 ENCOUNTER — Ambulatory Visit: Payer: No Typology Code available for payment source | Attending: Family Medicine | Admitting: Family Medicine

## 2017-03-11 VITALS — BP 127/83 | HR 88 | Temp 98.1°F | Wt 290.2 lb

## 2017-03-11 DIAGNOSIS — I82509 Chronic embolism and thrombosis of unspecified deep veins of unspecified lower extremity: Secondary | ICD-10-CM

## 2017-03-11 DIAGNOSIS — Z7984 Long term (current) use of oral hypoglycemic drugs: Secondary | ICD-10-CM | POA: Insufficient documentation

## 2017-03-11 DIAGNOSIS — Z23 Encounter for immunization: Secondary | ICD-10-CM

## 2017-03-11 DIAGNOSIS — K219 Gastro-esophageal reflux disease without esophagitis: Secondary | ICD-10-CM

## 2017-03-11 DIAGNOSIS — I10 Essential (primary) hypertension: Secondary | ICD-10-CM

## 2017-03-11 DIAGNOSIS — R05 Cough: Secondary | ICD-10-CM

## 2017-03-11 DIAGNOSIS — E119 Type 2 diabetes mellitus without complications: Secondary | ICD-10-CM

## 2017-03-11 DIAGNOSIS — Z86711 Personal history of pulmonary embolism: Secondary | ICD-10-CM | POA: Insufficient documentation

## 2017-03-11 DIAGNOSIS — Z7901 Long term (current) use of anticoagulants: Secondary | ICD-10-CM | POA: Insufficient documentation

## 2017-03-11 DIAGNOSIS — R059 Cough, unspecified: Secondary | ICD-10-CM

## 2017-03-11 DIAGNOSIS — E1149 Type 2 diabetes mellitus with other diabetic neurological complication: Secondary | ICD-10-CM

## 2017-03-11 DIAGNOSIS — G629 Polyneuropathy, unspecified: Secondary | ICD-10-CM

## 2017-03-11 DIAGNOSIS — M170 Bilateral primary osteoarthritis of knee: Secondary | ICD-10-CM

## 2017-03-11 DIAGNOSIS — F322 Major depressive disorder, single episode, severe without psychotic features: Secondary | ICD-10-CM

## 2017-03-11 LAB — POCT GLYCOSYLATED HEMOGLOBIN (HGB A1C): HEMOGLOBIN A1C: 8

## 2017-03-11 LAB — GLUCOSE, POCT (MANUAL RESULT ENTRY): POC Glucose: 294 mg/dl — AB (ref 70–99)

## 2017-03-11 MED ORDER — HYDROCHLOROTHIAZIDE 25 MG PO TABS: 25.0000 mg | ORAL_TABLET | Freq: Every day | ORAL | 5 refills | Status: DC

## 2017-03-11 MED ORDER — LISINOPRIL 10 MG PO TABS: 10.0000 mg | ORAL_TABLET | Freq: Every day | ORAL | 5 refills | Status: DC

## 2017-03-11 MED ORDER — GLIPIZIDE 5 MG PO TABS: 5.0000 mg | ORAL_TABLET | Freq: Two times a day (BID) | ORAL | 5 refills | Status: DC

## 2017-03-11 MED ORDER — ACETAMINOPHEN-CODEINE #3 300-30 MG PO TABS: 1.0000 | ORAL_TABLET | Freq: Two times a day (BID) | ORAL | 1 refills | Status: DC | PRN

## 2017-03-11 MED ORDER — METFORMIN HCL 500 MG PO TABS: 1000.0000 mg | ORAL_TABLET | Freq: Two times a day (BID) | ORAL | 5 refills | Status: DC

## 2017-03-11 MED ORDER — GABAPENTIN 300 MG PO CAPS: 300.0000 mg | ORAL_CAPSULE | Freq: Three times a day (TID) | ORAL | 5 refills | Status: DC

## 2017-03-11 MED ORDER — FAMOTIDINE 20 MG PO TABS: 20.0000 mg | ORAL_TABLET | Freq: Every day | ORAL | 3 refills | Status: DC | PRN

## 2017-03-11 MED ORDER — FLUOXETINE HCL 20 MG PO TABS: 40.0000 mg | ORAL_TABLET | Freq: Every day | ORAL | 5 refills | Status: DC

## 2017-03-11 MED ORDER — CETIRIZINE HCL 10 MG PO TABS: 10.0000 mg | ORAL_TABLET | Freq: Every day | ORAL | 1 refills | Status: DC

## 2017-03-11 MED FILL — ?GLIPIZIDE 5MG TABLET: 5 | 30 days supply | Qty: 60 | Fill #0

## 2017-03-11 MED FILL — ACETAMINOPHEN/COD #3 TABLET: 300-30 | 25 days supply | Qty: 50 | Fill #0

## 2017-03-11 MED FILL — ?CETIRIZINE HCL 10 MG TABLE: 10 | 30 days supply | Qty: 30 | Fill #0

## 2017-03-11 NOTE — Telephone Encounter (Signed)
[facility]

## 2017-03-11 NOTE — Patient Instructions (Signed)
Diabetes Mellitus and Food It is important for you to manage your blood sugar (glucose) level. Your blood glucose level can be greatly affected by what you eat. Eating healthier foods in the appropriate amounts throughout the day at about the same time each day will help you control your blood glucose level. It can also help slow or prevent worsening of your diabetes mellitus. Healthy eating may even help you improve the level of your blood pressure and reach or maintain a healthy weight. General recommendations for healthful eating and cooking habits include:  Eating meals and snacks regularly. Avoid going long periods of time without eating to lose weight.  Eating a diet that consists mainly of plant-based foods, such as fruits, vegetables, nuts, legumes, and whole grains.  Using low-heat cooking methods, such as baking, instead of high-heat cooking methods, such as deep frying.  Work with your dietitian to make sure you understand how to use the Nutrition Facts information on food labels. How can food affect me? Carbohydrates Carbohydrates affect your blood glucose level more than any other type of food. Your dietitian will help you determine how many carbohydrates to eat at each meal and teach you how to count carbohydrates. Counting carbohydrates is important to keep your blood glucose at a healthy level, especially if you are using insulin or taking certain medicines for diabetes mellitus. Alcohol Alcohol can cause sudden decreases in blood glucose (hypoglycemia), especially if you use insulin or take certain medicines for diabetes mellitus. Hypoglycemia can be a life-threatening condition. Symptoms of hypoglycemia (sleepiness, dizziness, and disorientation) are similar to symptoms of having too much alcohol. If your health care provider has given you approval to drink alcohol, do so in moderation and use the following guidelines:  Women should not have more than one drink per day, and men  should not have more than two drinks per day. One drink is equal to: ? 12 oz of beer. ? 5 oz of wine. ? 1 oz of hard liquor.  Do not drink on an empty stomach.  Keep yourself hydrated. Have water, diet soda, or unsweetened iced tea.  Regular soda, juice, and other mixers might contain a lot of carbohydrates and should be counted.  What foods are not recommended? As you make food choices, it is important to remember that all foods are not the same. Some foods have fewer nutrients per serving than other foods, even though they might have the same number of calories or carbohydrates. It is difficult to get your body what it needs when you eat foods with fewer nutrients. Examples of foods that you should avoid that are high in calories and carbohydrates but low in nutrients include:  Trans fats (most processed foods list trans fats on the Nutrition Facts label).  Regular soda.  Juice.  Candy.  Sweets, such as cake, pie, doughnuts, and cookies.  Fried foods.  What foods can I eat? Eat nutrient-rich foods, which will nourish your body and keep you healthy. The food you should eat also will depend on several factors, including:  The calories you need.  The medicines you take.  Your weight.  Your blood glucose level.  Your blood pressure level.  Your cholesterol level.  You should eat a variety of foods, including:  Protein. ? Lean cuts of meat. ? Proteins low in saturated fats, such as fish, egg whites, and beans. Avoid processed meats.  Fruits and vegetables. ? Fruits and vegetables that may help control blood glucose levels, such as apples,   mangoes, and yams.  Dairy products. ? Choose fat-free or low-fat dairy products, such as milk, yogurt, and cheese.  Grains, bread, pasta, and rice. ? Choose whole grain products, such as multigrain bread, whole oats, and brown rice. These foods may help control blood pressure.  Fats. ? Foods containing healthful fats, such as  nuts, avocado, olive oil, canola oil, and fish.  Does everyone with diabetes mellitus have the same meal plan? Because every person with diabetes mellitus is different, there is not one meal plan that works for everyone. It is very important that you meet with a dietitian who will help you create a meal plan that is just right for you. This information is not intended to replace advice given to you by your health care provider. Make sure you discuss any questions you have with your health care provider. Document Released: 05/31/2005 Document Revised: 02/09/2016 Document Reviewed: 07/31/2013 Elsevier Interactive Patient Education  2017 [employer] Inc.  

## 2017-03-11 NOTE — Progress Notes (Signed)
Subjective:  Patient ID: David Dunlap, male    DOB: 1971-07-29  Age: 46 y.o. MRN: [id number]  CC: Diabetes   HPI David Dunlap is a  46 year old male with a history of type 2 diabetes mellitus (A1c 8.0) hypertension, chronic PE, depression who presents today for follow-up visit.  He endorses compliance with his medications but not with exercise or a diabetic diet. Denies side effects from medications.  He does have bilateral knee pain and x-rays revealed age advanced osteoarthritis of the right knee and mild osteoarthritis of the left knee. Knee pain is associated with swelling R>L.Tylenol 3 does not help much Awaiting total R knee arthroplasty which will be scheduled after his cardiology clearance  His depression is controlled on his current dose of Prozac. He does have right knee pain today and is requesting refill of Tylenol No. 3.   Past Medical History:  Diagnosis Date  . Depression   . Diabetes mellitus without complication ([place]) 84/6659   NEW [place]ET    WITH FAMILY HISTORY  . DVT (deep venous thrombosis) ([place])   . Essential hypertension   . Gallstones 05/2016    Past Surgical History:  Procedure Laterality Date  . CHOLECYSTECTOMY N/A 06/07/2016   Procedure: LAPAROSCOPIC CHOLECYSTECTOMY WITH ATTEMPTED INTRAOPERATIVE CHOLANGIOGRAM;  Surgeon: Georganna Skeans, MD;  Location: [facility];  Service: General;  Laterality: N/A;  . LIGAMENT REPAIR     R forearm    Allergies  Allergen Reactions  . No Known Allergies      Outpatient Medications Prior to Visit  Medication Sig Dispense Refill  . Blood Glucose Monitoring Suppl (TRUE METRIX METER) DEVI 1 each by Does not apply route 3 (three) times daily before meals. 1 Device 0  . glucose blood (TRUE METRIX BLOOD GLUCOSE TEST) test strip Used daily before meals 100 each 12  . rivaroxaban (XARELTO) 20 MG TABS tablet Take 1 tablet (20 mg total) by mouth daily with supper. 90 tablet 3  . TRUEPLUS LANCETS 28G MISC Use daily  before meals 30 each 12  . acetaminophen-codeine (TYLENOL #3) 300-30 MG tablet Take 1 tablet by mouth every 12 (twelve) hours as needed for moderate pain. 50 tablet 1  . cetirizine (ZYRTEC) 10 MG tablet Take 1 tablet (10 mg total) by mouth daily. 30 tablet 1  . FLUoxetine (PROZAC) 20 MG capsule   3  . gabapentin (NEURONTIN) 300 MG capsule Take 1 capsule (300 mg total) by mouth 3 (three) times daily. 90 capsule 3  . hydrochlorothiazide (HYDRODIURIL) 25 MG tablet Take 1 tablet (25 mg total) by mouth daily. 30 tablet 3  . lisinopril (PRINIVIL,ZESTRIL) 10 MG tablet Take 1 tablet (10 mg total) by mouth daily. 30 tablet 3  . metFORMIN (GLUCOPHAGE) 500 MG tablet Take 2 tablets (1,000 mg total) by mouth 2 (two) times daily with a meal. 120 tablet 3  . famotidine (PEPCID) 20 MG tablet Take 1 tablet (20 mg total) by mouth daily as needed for heartburn. 30 tablet 3  . FLUoxetine (PROZAC) 20 MG tablet Take 2 tablets (40 mg total) by mouth daily. (Patient not taking: Reported on 03/11/2017) 60 tablet 3   No facility-administered medications prior to visit.     ROS Review of Systems Constitutional: Negative for activity change and appetite change.  HENT: Negative for sinus pressure and sore throat.   Eyes: Negative for visual disturbance.  Respiratory: Negative for cough, chest tightness and shortness of breath.   Cardiovascular: Negative for chest pain and leg swelling.  Gastrointestinal: Negative for abdominal distention, abdominal pain, constipation and diarrhea.  Endocrine: Negative.   Genitourinary: Negative for dysuria.  Musculoskeletal:       See hpi  Skin: Negative for rash.  Allergic/Immunologic: Negative.   Neurological: Negative for weakness, light-headedness and numbness.  Psychiatric/Behavioral: negative for suicidal ideas.  Objective:  BP 127/83   Pulse 88   Temp 98.1 F (36.7 C) (Oral)   Wt 290 lb 3.2 oz (131.6 kg)   SpO2 93%   BMI 42.24 kg/m   BP/Weight 03/11/2017 02/14/2017  1/61/0960  Systolic BP 454 098 -  Diastolic BP 83 90 -  Wt. (Lbs) 290.2 292.4 293  BMI 42.24 42.56 42.65      Physical Exam Constitutional: He is oriented to person, place, and time. He appears well-developed and well-nourished.  HEENT: Normal Cardiovascular: Normal rate, normal heart sounds and intact distal pulses.   No murmur heard. Pulmonary/Chest: Effort normal and breath sounds normal. He has no wheezes. He has no rales. He exhibits no tenderness.  Abdominal: Soft. Bowel sounds are normal. He exhibits no distension and no mass. There is no tenderness.  Musculoskeletal: He exhibits tenderness and crepitus(bilateral tenderness on ROM of knees). He exhibits no edema.  Neurological: He is alert and oriented to person, place, and time.  Skin: Skin is warm.  Psychiatric: He has a normal mood and affect    Lab Results  Component Value Date   HGBA1C 8.0 03/11/2017    Assessment & Plan:   1. Type 2 diabetes mellitus with other neurologic complication, without long-term current use of insulin (HCC) Uncontrolled with A1c of 8.0 Glipizide added to regimen Diabetic diet - POCT glucose (manual entry) - POCT glycosylated hemoglobin (Hb A1C) - metFORMIN (GLUCOPHAGE) 500 MG tablet; Take 2 tablets (1,000 mg total) by mouth 2 (two) times daily with a meal.  Dispense: 120 tablet; Refill: 5 - Ambulatory referral to Podiatry - glipiZIDE (GLUCOTROL) 5 MG tablet; Take 1 tablet (5 mg total) by mouth 2 (two) times daily before a meal.  Dispense: 60 tablet; Refill: 5 - CMP14+EGFR; Future - Lipid panel; Future - Microalbumin/Creatinine Ratio, Urine; Future  2. Primary osteoarthritis of both knees Awaiting Cardiology clearance prior to total knee arthroplasty - acetaminophen-codeine (TYLENOL #3) 300-30 MG tablet; Take 1 tablet by mouth every 12 (twelve) hours as needed for moderate pain.  Dispense: 50 tablet; Refill: 1  3. Cough - cetirizine (ZYRTEC) 10 MG tablet; Take 1 tablet (10 mg total)  by mouth daily.  Dispense: 30 tablet; Refill: 1  4. Essential hypertension Controlled Low-sodium diet - lisinopril (PRINIVIL,ZESTRIL) 10 MG tablet; Take 1 tablet (10 mg total) by mouth daily.  Dispense: 30 tablet; Refill: 5 - hydrochlorothiazide (HYDRODIURIL) 25 MG tablet; Take 1 tablet (25 mg total) by mouth daily.  Dispense: 30 tablet; Refill: 5  5. 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 Severe single current episode of major depressive disorder, without psychotic features (HCC) Controlled - FLUoxetine (PROZAC) 20 MG tablet; Take 2 tablets (40 mg total) by mouth daily.  Dispense: 60 tablet; Refill: 5  7. Neuropathy Stable - gabapentin (NEURONTIN) 300 MG capsule; Take 1 capsule (300 mg total) by mouth 3 (three) times daily.  Dispense: 90 capsule; Refill: 5  8. Gastroesophageal reflux disease without esophagitis - famotidine (PEPCID) 20 MG tablet; Take 1 tablet (20 mg total) by mouth daily as needed for heartburn.  Dispense: 30 tablet; Refill: 3  9. Need  for pneumococcal vaccination   Meds ordered this encounter  Medications  . acetaminophen-codeine (TYLENOL #3) 300-30 MG tablet    Sig: Take 1 tablet by mouth every 12 (twelve) hours as needed for moderate pain.    Dispense:  50 tablet    Refill:  1  . metFORMIN (GLUCOPHAGE) 500 MG tablet    Sig: Take 2 tablets (1,000 mg total) by mouth 2 (two) times daily with a meal.    Dispense:  120 tablet    Refill:  5  . cetirizine (ZYRTEC) 10 MG tablet    Sig: Take 1 tablet (10 mg total) by mouth daily.    Dispense:  30 tablet    Refill:  1  . lisinopril (PRINIVIL,ZESTRIL) 10 MG tablet    Sig: Take 1 tablet (10 mg total) by mouth daily.    Dispense:  30 tablet    Refill:  5  . hydrochlorothiazide (HYDRODIURIL) 25 MG tablet    Sig: Take 1 tablet (25 mg total) by mouth daily.    Dispense:  30 tablet    Refill:  5  . FLUoxetine (PROZAC) 20 MG  tablet    Sig: Take 2 tablets (40 mg total) by mouth daily.    Dispense:  60 tablet    Refill:  5  . gabapentin (NEURONTIN) 300 MG capsule    Sig: Take 1 capsule (300 mg total) by mouth 3 (three) times daily.    Dispense:  90 capsule    Refill:  5  . famotidine (PEPCID) 20 MG tablet    Sig: Take 1 tablet (20 mg total) by mouth daily as needed for heartburn.    Dispense:  30 tablet    Refill:  3  . glipiZIDE (GLUCOTROL) 5 MG tablet    Sig: Take 1 tablet (5 mg total) by mouth 2 (two) times daily before a meal.    Dispense:  60 tablet    Refill:  5    Follow-up: Return in about 3 months (around 06/11/2017) for Follow-up of chronic medical conditions.   Arnoldo Morale MD

## 2017-03-12 ENCOUNTER — Other Ambulatory Visit: Payer: Self-pay

## 2017-03-14 ENCOUNTER — Ambulatory Visit (HOSPITAL_COMMUNITY): Payer: No Typology Code available for payment source | Attending: Cardiology

## 2017-03-14 DIAGNOSIS — Z01818 Encounter for other preprocedural examination: Secondary | ICD-10-CM

## 2017-03-14 DIAGNOSIS — R000 Tachycardia, unspecified: Secondary | ICD-10-CM

## 2017-03-14 DIAGNOSIS — I071 Rheumatic tricuspid insufficiency: Secondary | ICD-10-CM | POA: Insufficient documentation

## 2017-03-15 ENCOUNTER — Telehealth: Payer: Self-pay | Admitting: Physician Assistant

## 2017-03-15 NOTE — Telephone Encounter (Signed)
Patient calling, states that he is returning your call. °

## 2017-03-15 NOTE — Telephone Encounter (Signed)
-----   Message from CheneyvilleBhavinkumar Bhagat, [place] sent at 03/15/2017 10:37 AM EDT ----- Echocardiogram showed normal pumping function of the heart with mild stiffness and hypertrophy of left ventricle. No valvular abnormality. He is clear for surgery with low to moderate risk given history of DVT and diabetes per Nedra HaiLee criteria.

## 2017-03-18 NOTE — Telephone Encounter (Signed)
Left message to call back for echo refills

## 2017-03-26 ENCOUNTER — Encounter: Payer: Self-pay | Admitting: *Deleted

## 2017-04-05 ENCOUNTER — Other Ambulatory Visit (INDEPENDENT_AMBULATORY_CARE_PROVIDER_SITE_OTHER): Payer: Self-pay | Admitting: Orthopaedic Surgery

## 2017-04-05 DIAGNOSIS — M1711 Unilateral primary osteoarthritis, right knee: Secondary | ICD-10-CM

## 2017-04-05 NOTE — Pre-Procedure Instructions (Signed)
Lucio EdwardKeith D Jasinski  04/05/2017      [facility] - [place], [place] - [address] [place] [address] Phone: [phone] Fax: [phone]  [facility] [place] [place] [address] Phone: [phone] Fax: [phone]    Your procedure is scheduled on August 1  [facility] at 1030 A.M.  Call this number if you have problems the morning of surgery:  [phone]   Remember:  Do not eat food or drink liquids after midnight.   Take these medicines the morning of surgery with A SIP OF WATER cetirizine (ZYRTEC), famotidine (PEPCID, FLUoxetine (PROZAC), gabapentin (NEURONTIN),   7 days prior to surgery STOP taking any Aspirin, Aleve, Naproxen, Ibuprofen, Motrin, Advil, Goody's, BC's, all herbal medications, fish oil, and all vitamins   FOLLOW MD INSTRUCTIONS ABOUT XARELTO  WHAT DO I DO ABOUT MY DIABETES MEDICATION?   Marland Kitchen. Do not take oral diabetes medicines (pills) the morning of surgery. glipiZIDE (GLUCOTROL) and metFORMIN (GLUCOPHAGE)  How to Manage Your Diabetes Before and After Surgery  Why is it important to control my blood sugar before and after surgery? . Improving blood sugar levels before and after surgery helps healing and can limit problems. . A way of improving blood sugar control is eating a healthy diet by: o  Eating less sugar and carbohydrates o  Increasing activity/exercise o  Talking with your doctor about reaching your blood sugar goals . High blood sugars (greater than 180 mg/dL) can raise your risk of infections and slow your recovery, so you will need to focus on controlling your diabetes during the weeks before surgery. . Make sure that the doctor who takes care of your diabetes knows about your planned surgery including the date and location.  How do I manage my blood sugar before  surgery? . Check your blood sugar at least 4 times a day, starting 2 days before surgery, to make sure that the level is not too high or low. o Check your blood sugar the morning of your surgery when you wake up and every 2 hours until you get to the Short Stay unit. . If your blood sugar is less than 70 mg/dL, you will need to treat for low blood sugar: o Do not take insulin. o Treat a low blood sugar (less than 70 mg/dL) with  cup of clear juice (cranberry or apple), 4 glucose tablets, OR glucose gel. o Recheck blood sugar in 15 minutes after treatment (to make sure it is greater than 70 mg/dL). If your blood sugar is not greater than 70 mg/dL on recheck, call [phone] for further instructions. . Report your blood sugar to the short stay nurse when you get to Short Stay.  . If you are admitted to the hospital after surgery: o Your blood sugar will be checked by the staff and you will probably be given insulin after surgery (instead of oral diabetes medicines) to make sure you have good blood sugar levels. o The goal for blood sugar control after surgery is 80-180 mg/dL.     Do not wear jewelry  Do not wear lotions, powders, or cologne, or deoderant.   Men may shave face and neck.  Do not bring valuables to the hospital.  [facility] is not responsible for any belongings or valuables.  Contacts, dentures or bridgework may not be worn into surgery.  Leave your suitcase in the car.  After surgery it may be brought to your room.  For patients admitted to the hospital, discharge time will be determined by your treatment team.  Patients discharged the day of surgery will not be allowed to drive home.    Special instructions:   [place]- Preparing For Surgery  Before surgery, you can play an important role. Because skin is not sterile, your skin needs to be as free of germs as possible. You can reduce the number of germs on your skin by washing with CHG (chlorahexidine gluconate)  Soap before surgery.  CHG is an antiseptic cleaner which kills germs and bonds with the skin to continue killing germs even after washing.  Please do not use if you have an allergy to CHG or antibacterial soaps. If your skin becomes reddened/irritated stop using the CHG.  Do not shave (including legs and underarms) for at least 48 hours prior to first CHG shower. It is OK to shave your face.  Please follow these instructions carefully.   1. Shower the NIGHT BEFORE SURGERY and the MORNING OF SURGERY with CHG.   2. If you chose to wash your hair, wash your hair first as usual with your normal shampoo.  3. After you shampoo, rinse your hair and body thoroughly to remove the shampoo.  4. Use CHG as you would any other liquid soap. You can apply CHG directly to the skin and wash gently with a scrungie or a clean washcloth.   5. Apply the CHG Soap to your body ONLY FROM THE NECK DOWN.  Do not use on open wounds or open sores. Avoid contact with your eyes, ears, mouth and genitals (private parts). Wash genitals (private parts) with your normal soap.  6. Wash thoroughly, paying special attention to the area where your surgery will be performed.  7. Thoroughly rinse your body with warm water from the neck down.  8. DO NOT shower/wash with your normal soap after using and rinsing off the CHG Soap.  9. Pat yourself dry with a CLEAN TOWEL.   10. Wear CLEAN PAJAMAS   11. Place CLEAN SHEETS on your bed the night of your first shower and DO NOT SLEEP WITH PETS.    Day of Surgery: Do not apply any deodorants/lotions. Please wear clean clothes to the hospital/surgery center.      Please read over the following fact sheets that you were given.

## 2017-04-08 ENCOUNTER — Encounter (HOSPITAL_COMMUNITY)
Admission: RE | Admit: 2017-04-08 | Discharge: 2017-04-08 | Disposition: A | Discharge status: Home / Self Care | Payer: No Typology Code available for payment source | Source: Ambulatory Visit | Attending: Orthopaedic Surgery | Admitting: Orthopaedic Surgery

## 2017-04-08 ENCOUNTER — Encounter (HOSPITAL_COMMUNITY): Payer: Self-pay

## 2017-04-08 DIAGNOSIS — Z01812 Encounter for preprocedural laboratory examination: Secondary | ICD-10-CM | POA: Insufficient documentation

## 2017-04-08 HISTORY — DX: Unspecified osteoarthritis, unspecified site: M19.90

## 2017-04-08 HISTORY — DX: Gastro-esophageal reflux disease without esophagitis: K21.9

## 2017-04-08 LAB — CBC WITH DIFFERENTIAL/PLATELET
Basophils Absolute: 0 10*3/uL (ref 0.0–0.1)
Basophils Relative: 0 %
Eosinophils Absolute: 0.1 10*3/uL (ref 0.0–0.7)
Eosinophils Relative: 1 %
HCT: 42.5 % (ref 39.0–52.0)
HEMOGLOBIN: 15.3 g/dL (ref 13.0–17.0)
LYMPHS ABS: 3.1 10*3/uL (ref 0.7–4.0)
Lymphocytes Relative: 24 %
MCH: 28.5 pg (ref 26.0–34.0)
MCHC: 36 g/dL (ref 30.0–36.0)
MCV: 79.3 fL (ref 78.0–100.0)
Monocytes Absolute: 0.8 10*3/uL (ref 0.1–1.0)
Monocytes Relative: 6 %
NEUTROS ABS: 9.2 10*3/uL — AB (ref 1.7–7.7)
NEUTROS PCT: 69 %
Platelets: 199 10*3/uL (ref 150–400)
RBC: 5.36 MIL/uL (ref 4.22–5.81)
RDW: 13.2 % (ref 11.5–15.5)
WBC: 13.3 10*3/uL — AB (ref 4.0–10.5)

## 2017-04-08 LAB — TYPE AND SCREEN
ABO/RH(D): A POS
Antibody Screen: NEGATIVE

## 2017-04-08 LAB — SURGICAL PCR SCREEN
MRSA, PCR: NEGATIVE
Staphylococcus aureus: NEGATIVE

## 2017-04-08 LAB — URINALYSIS, ROUTINE W REFLEX MICROSCOPIC
BILIRUBIN URINE: NEGATIVE
GLUCOSE, UA: 50 mg/dL — AB
Hgb urine dipstick: NEGATIVE
KETONES UR: NEGATIVE mg/dL
NITRITE: NEGATIVE
PH: 6 (ref 5.0–8.0)
Protein, ur: NEGATIVE mg/dL
Specific Gravity, Urine: 1.025 (ref 1.005–1.030)

## 2017-04-08 LAB — C-REACTIVE PROTEIN: CRP: 1.3 mg/dL — AB (ref <1.0)

## 2017-04-08 LAB — COMPREHENSIVE METABOLIC PANEL
ALBUMIN: 4.1 g/dL (ref 3.5–5.0)
ALK PHOS: 64 U/L (ref 38–126)
ALT: 47 U/L (ref 17–63)
AST: 16 U/L (ref 15–41)
Anion gap: 8 (ref 5–15)
BUN: 17 mg/dL (ref 6–20)
CALCIUM: 9.7 mg/dL (ref 8.9–10.3)
CO2: 25 mmol/L (ref 22–32)
CREATININE: 1.25 mg/dL — AB (ref 0.61–1.24)
Chloride: 101 mmol/L (ref 101–111)
GFR calc non Af Amer: >60 mL/min (ref >60)
GLUCOSE: 185 mg/dL — AB (ref 65–99)
Potassium: 4.2 mmol/L (ref 3.5–5.1)
SODIUM: 134 mmol/L — AB (ref 135–145)
Total Bilirubin: 0.7 mg/dL (ref 0.3–1.2)
Total Protein: 7.3 g/dL (ref 6.5–8.1)

## 2017-04-08 LAB — PROTIME-INR
INR: 1
Prothrombin Time: 13.2 seconds (ref 11.4–15.2)

## 2017-04-08 LAB — ABO/RH: ABO/RH(D): A POS

## 2017-04-08 LAB — SEDIMENTATION RATE: Sed Rate: 4 mm/hr (ref 0–16)

## 2017-04-08 LAB — APTT: aPTT: 26 seconds (ref 24–36)

## 2017-04-08 LAB — GLUCOSE, CAPILLARY: Glucose-Capillary: 211 mg/dL — ABNORMAL HIGH (ref 65–99)

## 2017-04-08 NOTE — Progress Notes (Signed)
   04/08/17 1310  OBSTRUCTIVE SLEEP APNEA  Have you ever been diagnosed with sleep apnea through a sleep study? No  Do you snore loudly (loud enough to be heard through closed doors)?  1  Do you often feel tired, fatigued, or sleepy during the daytime (such as falling asleep during driving or talking to someone)? 1  Has anyone observed you stop breathing during your sleep? 1  Do you have, or are you being treated for high blood pressure? 1  BMI more than 35 kg/m2? 1  Age > 50 (1-yes) 0  Neck circumference greater than:Male 16 inches or larger, Male 17inches or larger? 1  Male Gender (Yes=1) 1  Obstructive Sleep Apnea Score 7  Score 5 or greater  Results sent to PCP

## 2017-04-08 NOTE — Progress Notes (Signed)
PCp is Dr. Jaclyn ShaggyEnobong Amao   LOV 02/2017 Cardio is Dr. Merrilee JanskyBhagat BhevinKumar  LOV 02/2017   Echo 02/2017 (was clear, per the patient)   Cardiac clearance note in EPIC Dx with DM 05/2016  H/o DVT (chronic)  Takes aspirin and Xarelto.  Have instructed the patient his last dose of it will be the Saturday dose.  Did instruct him that as far as anesthesia is concerned, he can continue his aspirin up until the day of surgery, but to ask his surgeon for sure. Checks his sugars daily.  It runs 160-200 range. Last A1C 7.6   10/2016

## 2017-04-09 LAB — HEMOGLOBIN A1C
HEMOGLOBIN A1C: 7.6 % — AB (ref 4.8–5.6)
Mean Plasma Glucose: 171 mg/dL

## 2017-04-09 NOTE — Progress Notes (Signed)
Anesthesia Chart Review: Patient is a 46 year old male scheduled for right TKA on 04/17/17 by Dr. Roda ShuttersXu. Case is posted for spinal anesthesia.   History includes never smoker, HTN, DM2, RLE DVT 03/18/14 (chronic right PT vein DVT 11/17/14 Duplex), LLE DVT 11/17/14 (choinc left peroneal vein thrombosis with resolution of left PT vein DVT 10/28/15 Duplex), GERD, arthritis, depression, cholecystectomy 06/07/16. BMI is consistent with morbid obesity. He is on Xarelto for chronic DVT. OSA screening score was 7.   - PCP is Dr. Jaclyn ShaggyEnobong Amao with [facility], who is aware of surgery plans.  - Cardiologist is Dr. Eden EmmsNishan, newly seen by Manson PasseyBhavinkumar Bhagat, PA on 02/14/17 for preoperative clearance. Following echo, he wrote, "Echocardiogram showed normal pumping function of the heart with mild stiffness and hypertrophy of left ventricle. No valvular abnormality. He is clear for surgery with low to moderate risk given history of DVT and diabetes per Nedra HaiLee criteria."  Meds include aspirin 81 mg, Zyrtec, Pepcid, Prozac, Neurontin, glipizide, HCTZ, lisinopril, metformin, Xarelto. Patient reported being instructed to hold for three days prior to surgery (last dose 7/28 PM).   BP 127/74   Pulse 91   Temp 37.2 C   Resp 20   Ht 5\' 9"  (1.753 m)   Wt 282 lb 1.6 oz (128 kg)   SpO2 95%   BMI 41.66 kg/m   EKG 02/14/17: ST at 109, minimal voltage criteria for LVH, may be normal variant.  Echo 03/14/17: Study Conclusions - Left ventricle: The cavity size was normal. There was mild   concentric hypertrophy. Systolic function was normal. Wall motion   was normal; there were no regional wall motion abnormalities.   Doppler parameters are consistent with abnormal left ventricular   relaxation (grade 1 diastolic dysfunction). There was no evidence   of elevated ventricular filling pressure by Doppler parameters. - Aortic valve: There was no regurgitation. - Aortic root: The aortic root was normal  in size. - Left atrium: The atrium was at the upper limits of normal in   size. - Right ventricle: Systolic function was normal. - Tricuspid valve: There was trivial regurgitation. - Pulmonary arteries: Systolic pressure was within the normal   range. - Inferior vena cava: The vessel was normal in size. - Pericardium, extracardiac: There was no pericardial effusion.  CXR 11/28/16: IMPRESSION: No active cardiopulmonary disease.  Preoperative labs noted. Cr 1.25. WBC 13.3 (previously 14.4 on 11/28/16 with range of 10.4-15.2 since 06/05/16). H/H 15.3/42.5. PLT 199. PT/PTT WNL. A1c 7.6 (down from 8.0 on 03/11/17). I left a voice message with Sherrie at Dr. Warren DanesXu's office regarding WBC.   If no acute changes then I would anticipate that he can proceed as planned.  Velna Ochsllison Vernell Back, PA-C [facility]/Anesthesiology Phone [phone] 04/09/2017 6:26 PM

## 2017-04-11 ENCOUNTER — Other Ambulatory Visit: Payer: Self-pay | Admitting: Family Medicine

## 2017-04-11 DIAGNOSIS — R059 Cough, unspecified: Secondary | ICD-10-CM

## 2017-04-11 DIAGNOSIS — R05 Cough: Secondary | ICD-10-CM

## 2017-04-11 DIAGNOSIS — M170 Bilateral primary osteoarthritis of knee: Secondary | ICD-10-CM

## 2017-04-11 MED FILL — LISINOPRIL 10 MG TABLET: 10 | 30 days supply | Qty: 30 | Fill #0

## 2017-04-11 MED FILL — GABAPENTIN 300 MG CAPSULE: 300 | 30 days supply | Qty: 90 | Fill #0

## 2017-04-11 MED FILL — FAMOTIDINE 20 MG TABLET: 20 | 30 days supply | Qty: 30 | Fill #0

## 2017-04-11 MED FILL — ACETAMINOPHEN/COD #3 TABLET: 300-30 | 25 days supply | Qty: 50 | Fill #1

## 2017-04-11 MED FILL — FLUoxetine HCL 20 MG CAPS: 20 | 30 days supply | Qty: 60 | Fill #0

## 2017-04-11 MED FILL — ?METFORMIN HCL 500MG TABLET: 500 | 120 days supply | Qty: 120 | Fill #0

## 2017-04-11 MED FILL — glipiZIDE 5 MG TABS: 5 | 30 days supply | Qty: 60 | Fill #1

## 2017-04-11 MED FILL — HYDROCHLOROTHIAZIDE 25 MG T: 25 | 30 days supply | Qty: 30 | Fill #0

## 2017-04-11 MED FILL — ?CETIRIZINE HCL 10 MG TABLE: 10 | 30 days supply | Qty: 30 | Fill #1

## 2017-04-16 ENCOUNTER — Ambulatory Visit: Payer: Self-pay | Admitting: Podiatry

## 2017-04-16 MED ORDER — DEXTROSE 5 % IV SOLN
3.0000 g | INTRAVENOUS | Status: AC
  Administered 2017-04-17: 3 g via INTRAVENOUS
  Filled 2017-04-16: qty 3000

## 2017-04-16 MED ORDER — BUPIVACAINE LIPOSOME 1.3 % IJ SUSP
20.0000 mL | INTRAMUSCULAR | Status: AC
  Administered 2017-04-17: 20 mL
  Filled 2017-04-16: qty 20

## 2017-04-16 MED FILL — XARELTO 20 MG TABLET: 20 | 30 days supply | Qty: 30 | Fill #4

## 2017-04-17 ENCOUNTER — Encounter (HOSPITAL_COMMUNITY)
Admission: RE | Disposition: A | Discharge status: Home Health | Payer: Self-pay | Source: Ambulatory Visit | Attending: Orthopaedic Surgery

## 2017-04-17 ENCOUNTER — Inpatient Hospital Stay (HOSPITAL_COMMUNITY): Payer: Self-pay | Admitting: Certified Registered Nurse Anesthetist

## 2017-04-17 ENCOUNTER — Inpatient Hospital Stay (HOSPITAL_COMMUNITY)
Admission: RE | Admit: 2017-04-17 | Discharge: 2017-04-19 | DRG: 470 | Disposition: A | Discharge status: Home Health | Payer: Self-pay | Source: Ambulatory Visit | Attending: Orthopaedic Surgery | Admitting: Orthopaedic Surgery

## 2017-04-17 ENCOUNTER — Inpatient Hospital Stay (HOSPITAL_COMMUNITY): Payer: Self-pay | Admitting: Vascular Surgery

## 2017-04-17 ENCOUNTER — Encounter (HOSPITAL_COMMUNITY): Payer: Self-pay | Admitting: *Deleted

## 2017-04-17 ENCOUNTER — Inpatient Hospital Stay (HOSPITAL_COMMUNITY): Payer: Self-pay

## 2017-04-17 DIAGNOSIS — Z833 Family history of diabetes mellitus: Secondary | ICD-10-CM

## 2017-04-17 DIAGNOSIS — E119 Type 2 diabetes mellitus without complications: Secondary | ICD-10-CM | POA: Diagnosis present

## 2017-04-17 DIAGNOSIS — Z79899 Other long term (current) drug therapy: Secondary | ICD-10-CM

## 2017-04-17 DIAGNOSIS — M1711 Unilateral primary osteoarthritis, right knee: Principal | ICD-10-CM | POA: Diagnosis present

## 2017-04-17 DIAGNOSIS — Z8249 Family history of ischemic heart disease and other diseases of the circulatory system: Secondary | ICD-10-CM

## 2017-04-17 DIAGNOSIS — Z7984 Long term (current) use of oral hypoglycemic drugs: Secondary | ICD-10-CM

## 2017-04-17 DIAGNOSIS — Z96651 Presence of right artificial knee joint: Secondary | ICD-10-CM

## 2017-04-17 DIAGNOSIS — Z86718 Personal history of other venous thrombosis and embolism: Secondary | ICD-10-CM

## 2017-04-17 DIAGNOSIS — Z7901 Long term (current) use of anticoagulants: Secondary | ICD-10-CM

## 2017-04-17 DIAGNOSIS — Z7982 Long term (current) use of aspirin: Secondary | ICD-10-CM

## 2017-04-17 DIAGNOSIS — K219 Gastro-esophageal reflux disease without esophagitis: Secondary | ICD-10-CM | POA: Diagnosis present

## 2017-04-17 DIAGNOSIS — Z6841 Body Mass Index (BMI) 40.0 and over, adult: Secondary | ICD-10-CM

## 2017-04-17 DIAGNOSIS — Z96659 Presence of unspecified artificial knee joint: Secondary | ICD-10-CM

## 2017-04-17 DIAGNOSIS — I10 Essential (primary) hypertension: Secondary | ICD-10-CM | POA: Diagnosis present

## 2017-04-17 DIAGNOSIS — D62 Acute posthemorrhagic anemia: Secondary | ICD-10-CM | POA: Diagnosis not present

## 2017-04-17 HISTORY — PX: TOTAL KNEE ARTHROPLASTY: SHX125

## 2017-04-17 LAB — GLUCOSE, CAPILLARY
GLUCOSE-CAPILLARY: 140 mg/dL — AB (ref 65–99)
GLUCOSE-CAPILLARY: 107 mg/dL — AB (ref 65–99)
Glucose-Capillary: 192 mg/dL — ABNORMAL HIGH (ref 65–99)

## 2017-04-17 SURGERY — ARTHROPLASTY, KNEE, TOTAL
Anesthesia: Monitor Anesthesia Care | Site: Knee | Laterality: Right

## 2017-04-17 MED ORDER — MIDAZOLAM HCL 5 MG/5ML IJ SOLN
INTRAMUSCULAR | Status: DC | PRN
  Administered 2017-04-17: 2 mg via INTRAVENOUS

## 2017-04-17 MED ORDER — ALUM & MAG HYDROXIDE-SIMETH 200-200-20 MG/5ML PO SUSP: 30.0000 mL | ORAL | Status: DC | PRN

## 2017-04-17 MED ORDER — CHLORHEXIDINE GLUCONATE 4 % EX LIQD: 60.0000 mL | Freq: Once | CUTANEOUS | Status: DC

## 2017-04-17 MED ORDER — SULFAMETHOXAZOLE-TRIMETHOPRIM 800-160 MG PO TABS
1.0000 | ORAL_TABLET | Freq: Two times a day (BID) | ORAL | 0 refills | Status: DC
Start: 2017-04-17 — End: 2017-06-12

## 2017-04-17 MED ORDER — TRANEXAMIC ACID 1000 MG/10ML IV SOLN
2000.0000 mg | Freq: Once | INTRAVENOUS | Status: DC
  Filled 2017-04-17: qty 20

## 2017-04-17 MED ORDER — INSULIN ASPART 100 UNIT/ML ~~LOC~~ SOLN
0.0000 [IU] | Freq: Every day | SUBCUTANEOUS | Status: DC
  Administered 2017-04-18: 2 [IU] via SUBCUTANEOUS

## 2017-04-17 MED ORDER — DEXAMETHASONE SODIUM PHOSPHATE 10 MG/ML IJ SOLN
10.0000 mg | Freq: Once | INTRAMUSCULAR | Status: AC
  Administered 2017-04-18: 10 mg via INTRAVENOUS
  Filled 2017-04-17: qty 1

## 2017-04-17 MED ORDER — ONDANSETRON HCL 4 MG PO TABS: 4.0000 mg | ORAL_TABLET | Freq: Three times a day (TID) | ORAL | 0 refills | Status: DC | PRN

## 2017-04-17 MED ORDER — METHOCARBAMOL 1000 MG/10ML IJ SOLN
500.0000 mg | Freq: Four times a day (QID) | INTRAVENOUS | Status: DC | PRN
  Filled 2017-04-17: qty 5

## 2017-04-17 MED ORDER — SODIUM CHLORIDE 0.9 % IV SOLN
INTRAVENOUS | Status: DC
  Administered 2017-04-17: 19:00:00 via INTRAVENOUS

## 2017-04-17 MED ORDER — PROPOFOL 10 MG/ML IV BOLUS
INTRAVENOUS | Status: DC | PRN
  Administered 2017-04-17 (×2): 30 mg via INTRAVENOUS

## 2017-04-17 MED ORDER — METHOCARBAMOL 500 MG PO TABS
500.0000 mg | ORAL_TABLET | Freq: Four times a day (QID) | ORAL | Status: DC | PRN
  Administered 2017-04-17 – 2017-04-19 (×3): 500 mg via ORAL
  Filled 2017-04-17 (×3): qty 1

## 2017-04-17 MED ORDER — KETOROLAC TROMETHAMINE 15 MG/ML IJ SOLN
30.0000 mg | Freq: Four times a day (QID) | INTRAMUSCULAR | Status: AC
  Administered 2017-04-17 – 2017-04-18 (×4): 30 mg via INTRAVENOUS
  Filled 2017-04-17 (×4): qty 2

## 2017-04-17 MED ORDER — FAMOTIDINE 20 MG PO TABS: 20.0000 mg | ORAL_TABLET | Freq: Every day | ORAL | Status: DC | PRN

## 2017-04-17 MED ORDER — PROPOFOL 500 MG/50ML IV EMUL
INTRAVENOUS | Status: DC | PRN
  Administered 2017-04-17: 100 ug/kg/min via INTRAVENOUS

## 2017-04-17 MED ORDER — DEXTROSE 5 % IV SOLN
INTRAVENOUS | Status: DC | PRN
  Administered 2017-04-17: 40 ug/min via INTRAVENOUS

## 2017-04-17 MED ORDER — POLYETHYLENE GLYCOL 3350 17 G PO PACK: 17.0000 g | PACK | Freq: Every day | ORAL | Status: DC | PRN

## 2017-04-17 MED ORDER — SULFAMETHOXAZOLE-TRIMETHOPRIM 800-160 MG PO TABS
1.0000 | ORAL_TABLET | Freq: Two times a day (BID) | ORAL | Status: DC
  Administered 2017-04-17 – 2017-04-19 (×4): 1 via ORAL
  Filled 2017-04-17 (×4): qty 1

## 2017-04-17 MED ORDER — LACTATED RINGERS IV SOLN
INTRAVENOUS | Status: DC
  Administered 2017-04-17: 12:00:00 via INTRAVENOUS

## 2017-04-17 MED ORDER — ACETAMINOPHEN 650 MG RE SUPP: 650.0000 mg | Freq: Four times a day (QID) | RECTAL | Status: DC | PRN

## 2017-04-17 MED ORDER — MORPHINE SULFATE (PF) 4 MG/ML IV SOLN
1.0000 mg | INTRAVENOUS | Status: DC | PRN
Start: 2017-04-17 — End: 2017-04-19
  Administered 2017-04-17: 1 mg via INTRAVENOUS
  Filled 2017-04-17: qty 1

## 2017-04-17 MED ORDER — INSULIN ASPART 100 UNIT/ML ~~LOC~~ SOLN
0.0000 [IU] | Freq: Three times a day (TID) | SUBCUTANEOUS | Status: DC
  Administered 2017-04-18 (×2): 7 [IU] via SUBCUTANEOUS
  Administered 2017-04-18 – 2017-04-19 (×2): 4 [IU] via SUBCUTANEOUS
  Administered 2017-04-19: 7 [IU] via SUBCUTANEOUS

## 2017-04-17 MED ORDER — ACETAMINOPHEN 500 MG PO TABS
1000.0000 mg | ORAL_TABLET | Freq: Four times a day (QID) | ORAL | Status: AC
  Administered 2017-04-17 – 2017-04-18 (×4): 1000 mg via ORAL
  Filled 2017-04-17 (×4): qty 2

## 2017-04-17 MED ORDER — MAGNESIUM CITRATE PO SOLN: 1.0000 | Freq: Once | ORAL | Status: DC | PRN

## 2017-04-17 MED ORDER — MIDAZOLAM HCL 2 MG/2ML IJ SOLN
2.0000 mg | Freq: Once | INTRAMUSCULAR | Status: AC
  Administered 2017-04-17: 2 mg via INTRAVENOUS

## 2017-04-17 MED ORDER — PHENOL 1.4 % MT LIQD: 1.0000 | OROMUCOSAL | Status: DC | PRN

## 2017-04-17 MED ORDER — OXYCODONE HCL ER 10 MG PO T12A: 10.0000 mg | EXTENDED_RELEASE_TABLET | Freq: Two times a day (BID) | ORAL | 0 refills | Status: DC

## 2017-04-17 MED ORDER — TRANEXAMIC ACID 1000 MG/10ML IV SOLN
INTRAVENOUS | Status: AC | PRN
  Administered 2017-04-17: 2000 mg via TOPICAL

## 2017-04-17 MED ORDER — ACETAMINOPHEN 325 MG PO TABS
650.0000 mg | ORAL_TABLET | Freq: Four times a day (QID) | ORAL | Status: DC | PRN
  Administered 2017-04-18 – 2017-04-19 (×2): 650 mg via ORAL
  Filled 2017-04-17 (×2): qty 2

## 2017-04-17 MED ORDER — OXYCODONE HCL 5 MG PO TABS
5.0000 mg | ORAL_TABLET | ORAL | Status: DC | PRN
  Administered 2017-04-17 (×2): 15 mg via ORAL
  Administered 2017-04-18 (×2): 10 mg via ORAL
  Administered 2017-04-19 (×2): 15 mg via ORAL
  Filled 2017-04-17 (×3): qty 3
  Filled 2017-04-17: qty 2
  Filled 2017-04-17 (×2): qty 3

## 2017-04-17 MED ORDER — MIDAZOLAM HCL 2 MG/2ML IJ SOLN
INTRAMUSCULAR | Status: AC
  Filled 2017-04-17: qty 2

## 2017-04-17 MED ORDER — METHOCARBAMOL 750 MG PO TABS: 750.0000 mg | ORAL_TABLET | Freq: Two times a day (BID) | ORAL | 0 refills | Status: DC | PRN

## 2017-04-17 MED ORDER — FLUOXETINE HCL 20 MG PO CAPS
40.0000 mg | ORAL_CAPSULE | Freq: Every day | ORAL | Status: DC
  Administered 2017-04-18 – 2017-04-19 (×2): 40 mg via ORAL
  Filled 2017-04-17 (×3): qty 2

## 2017-04-17 MED ORDER — METOCLOPRAMIDE HCL 5 MG/ML IJ SOLN: 5.0000 mg | Freq: Three times a day (TID) | INTRAMUSCULAR | Status: DC | PRN

## 2017-04-17 MED ORDER — DIPHENHYDRAMINE HCL 12.5 MG/5ML PO ELIX: 25.0000 mg | ORAL_SOLUTION | ORAL | Status: DC | PRN

## 2017-04-17 MED ORDER — DEXTROSE 5 % IV SOLN
3.0000 g | Freq: Three times a day (TID) | INTRAVENOUS | Status: DC
  Filled 2017-04-17 (×3): qty 3000

## 2017-04-17 MED ORDER — METOCLOPRAMIDE HCL 5 MG PO TABS: 5.0000 mg | ORAL_TABLET | Freq: Three times a day (TID) | ORAL | Status: DC | PRN

## 2017-04-17 MED ORDER — OXYCODONE HCL ER 15 MG PO T12A
15.0000 mg | EXTENDED_RELEASE_TABLET | Freq: Two times a day (BID) | ORAL | Status: DC
  Administered 2017-04-17 – 2017-04-19 (×4): 15 mg via ORAL
  Filled 2017-04-17 (×4): qty 1

## 2017-04-17 MED ORDER — ONDANSETRON HCL 4 MG/2ML IJ SOLN: 4.0000 mg | Freq: Four times a day (QID) | INTRAMUSCULAR | Status: DC | PRN

## 2017-04-17 MED ORDER — FENTANYL CITRATE (PF) 100 MCG/2ML IJ SOLN
100.0000 ug | Freq: Once | INTRAMUSCULAR | Status: AC
  Administered 2017-04-17: 100 ug via INTRAVENOUS

## 2017-04-17 MED ORDER — SORBITOL 70 % SOLN: 30.0000 mL | Freq: Every day | Status: DC | PRN

## 2017-04-17 MED ORDER — SODIUM CHLORIDE 0.9 % IR SOLN
Status: DC | PRN
  Administered 2017-04-17: 3000 mL

## 2017-04-17 MED ORDER — GABAPENTIN 300 MG PO CAPS
300.0000 mg | ORAL_CAPSULE | Freq: Three times a day (TID) | ORAL | Status: DC
  Administered 2017-04-17 – 2017-04-19 (×5): 300 mg via ORAL
  Filled 2017-04-17 (×5): qty 1

## 2017-04-17 MED ORDER — ASPIRIN EC 81 MG PO TBEC
81.0000 mg | DELAYED_RELEASE_TABLET | Freq: Every day | ORAL | Status: DC
  Administered 2017-04-18 – 2017-04-19 (×2): 81 mg via ORAL
  Filled 2017-04-17 (×2): qty 1

## 2017-04-17 MED ORDER — SODIUM CHLORIDE 0.9 % IJ SOLN
INTRAMUSCULAR | Status: DC | PRN
  Administered 2017-04-17: 40 mL via INTRAVENOUS

## 2017-04-17 MED ORDER — GLIPIZIDE 5 MG PO TABS
5.0000 mg | ORAL_TABLET | Freq: Two times a day (BID) | ORAL | Status: DC
  Administered 2017-04-18 – 2017-04-19 (×3): 5 mg via ORAL
  Filled 2017-04-17 (×3): qty 1

## 2017-04-17 MED ORDER — FENTANYL CITRATE (PF) 100 MCG/2ML IJ SOLN
INTRAMUSCULAR | Status: AC
  Filled 2017-04-17: qty 2

## 2017-04-17 MED ORDER — SENNOSIDES-DOCUSATE SODIUM 8.6-50 MG PO TABS: 1.0000 | ORAL_TABLET | Freq: Every evening | ORAL | 1 refills | Status: DC | PRN

## 2017-04-17 MED ORDER — FENTANYL CITRATE (PF) 250 MCG/5ML IJ SOLN
INTRAMUSCULAR | Status: AC
  Filled 2017-04-17: qty 5

## 2017-04-17 MED ORDER — ROPIVACAINE HCL 5 MG/ML IJ SOLN
INTRAMUSCULAR | Status: DC | PRN
  Administered 2017-04-17: 30 mL via PERINEURAL

## 2017-04-17 MED ORDER — MEPERIDINE HCL 25 MG/ML IJ SOLN: 6.2500 mg | INTRAMUSCULAR | Status: DC | PRN

## 2017-04-17 MED ORDER — PROMETHAZINE HCL 25 MG/ML IJ SOLN: 6.2500 mg | INTRAMUSCULAR | Status: DC | PRN

## 2017-04-17 MED ORDER — LISINOPRIL 10 MG PO TABS
10.0000 mg | ORAL_TABLET | Freq: Every day | ORAL | Status: DC
  Administered 2017-04-18 – 2017-04-19 (×2): 10 mg via ORAL
  Filled 2017-04-17 (×2): qty 1

## 2017-04-17 MED ORDER — MIDAZOLAM HCL 2 MG/2ML IJ SOLN: 0.5000 mg | Freq: Once | INTRAMUSCULAR | Status: DC | PRN

## 2017-04-17 MED ORDER — DEXTROSE 5 % IV SOLN
3.0000 g | Freq: Three times a day (TID) | INTRAVENOUS | Status: AC
  Administered 2017-04-17 – 2017-04-18 (×2): 3 g via INTRAVENOUS
  Filled 2017-04-17 (×3): qty 3000

## 2017-04-17 MED ORDER — BUPIVACAINE IN DEXTROSE 0.75-8.25 % IT SOLN
INTRATHECAL | Status: DC | PRN
  Administered 2017-04-17: 15 mg via INTRATHECAL

## 2017-04-17 MED ORDER — HYDROCHLOROTHIAZIDE 25 MG PO TABS
25.0000 mg | ORAL_TABLET | Freq: Every day | ORAL | Status: DC
  Administered 2017-04-18 – 2017-04-19 (×2): 25 mg via ORAL
  Filled 2017-04-17 (×2): qty 1

## 2017-04-17 MED ORDER — OXYCODONE HCL 5 MG PO TABS: 5.0000 mg | ORAL_TABLET | ORAL | 0 refills | Status: DC | PRN

## 2017-04-17 MED ORDER — RIVAROXABAN 20 MG PO TABS
20.0000 mg | ORAL_TABLET | Freq: Every day | ORAL | Status: DC
  Administered 2017-04-18: 20 mg via ORAL
  Filled 2017-04-17: qty 1

## 2017-04-17 MED ORDER — ONDANSETRON HCL 4 MG PO TABS: 4.0000 mg | ORAL_TABLET | Freq: Four times a day (QID) | ORAL | Status: DC | PRN

## 2017-04-17 MED ORDER — HYDROMORPHONE HCL 1 MG/ML IJ SOLN: 0.2500 mg | INTRAMUSCULAR | Status: DC | PRN

## 2017-04-17 MED ORDER — MENTHOL 3 MG MT LOZG: 1.0000 | LOZENGE | OROMUCOSAL | Status: DC | PRN

## 2017-04-17 SURGICAL SUPPLY — 74 items
ALCOHOL ISOPROPYL (RUBBING) (MISCELLANEOUS) ×3 IMPLANT
BAG DECANTER FOR FLEXI CONT (MISCELLANEOUS) ×3 IMPLANT
BANDAGE ACE 6X5 VEL STRL LF (GAUZE/BANDAGES/DRESSINGS) ×6 IMPLANT
BANDAGE ELASTIC 6 VELCRO ST LF (GAUZE/BANDAGES/DRESSINGS) ×3 IMPLANT
BANDAGE ESMARK 6X9 LF (GAUZE/BANDAGES/DRESSINGS) ×1 IMPLANT
BENZOIN TINCTURE PRP APPL 2/3 (GAUZE/BANDAGES/DRESSINGS) ×3 IMPLANT
BIT DRILL QUICK REL 1/8 2PK SL (DRILL) ×3 IMPLANT
BLADE SAW SGTL 13.0X1.19X90.0M (BLADE) ×3 IMPLANT
BNDG ESMARK 6X9 LF (GAUZE/BANDAGES/DRESSINGS) ×3
BOWL SMART MIX CTS (DISPOSABLE) ×3 IMPLANT
CAPT KNEE TOTAL 4 (Knees) ×3 IMPLANT
CLOSURE SKIN 1/8X3 (GAUZE/BANDAGES/DRESSINGS) ×1
CLOSURE STERI-STRIP 1/2X4 (GAUZE/BANDAGES/DRESSINGS) ×2
CLSR STERI-STRIP ANTIMIC 1/2X4 (GAUZE/BANDAGES/DRESSINGS) ×4 IMPLANT
COVER SURGICAL LIGHT HANDLE (MISCELLANEOUS) ×6 IMPLANT
CUFF TOURNIQUET SINGLE 34IN LL (TOURNIQUET CUFF) ×3 IMPLANT
DRAPE EXTREMITY T 121X128X90 (DRAPE) ×3 IMPLANT
DRAPE HALF SHEET 40X57 (DRAPES) ×3 IMPLANT
DRAPE INCISE IOBAN 66X45 STRL (DRAPES) ×3 IMPLANT
DRAPE ORTHO SPLIT 77X108 STRL (DRAPES) ×4
DRAPE SURG 17X11 SM STRL (DRAPES) ×6 IMPLANT
DRAPE SURG ORHT 6 SPLT 77X108 (DRAPES) ×2 IMPLANT
DRILL QUICK RELEASE 1/8 INCH (DRILL) ×6
DRSG AQUACEL AG ADV 3.5X14 (GAUZE/BANDAGES/DRESSINGS) ×3 IMPLANT
DURAPREP 26ML APPLICATOR (WOUND CARE) ×9 IMPLANT
ELECT CAUTERY BLADE 6.4 (BLADE) ×6 IMPLANT
ELECT REM PT RETURN 9FT ADLT (ELECTROSURGICAL) ×3
ELECTRODE REM PT RTRN 9FT ADLT (ELECTROSURGICAL) ×1 IMPLANT
GLOVE BIO SURGEON STRL SZ 6.5 (GLOVE) ×4 IMPLANT
GLOVE BIO SURGEONS STRL SZ 6.5 (GLOVE) ×2
GLOVE BIOGEL PI IND STRL 6.5 (GLOVE) ×3 IMPLANT
GLOVE BIOGEL PI IND STRL 8 (GLOVE) ×1 IMPLANT
GLOVE BIOGEL PI INDICATOR 6.5 (GLOVE) ×6
GLOVE BIOGEL PI INDICATOR 8 (GLOVE) ×2
GLOVE SKINSENSE NS SZ7.5 (GLOVE) ×2
GLOVE SKINSENSE NS SZ8.0 LF (GLOVE) ×2
GLOVE SKINSENSE STRL SZ7.5 (GLOVE) ×1 IMPLANT
GLOVE SKINSENSE STRL SZ8.0 LF (GLOVE) ×1 IMPLANT
GLOVE SURG SS PI 7.5 STRL IVOR (GLOVE) ×3 IMPLANT
GLOVE SURG SYN 7.5  E (GLOVE) ×8
GLOVE SURG SYN 7.5 E (GLOVE) ×4 IMPLANT
GOWN STRL REIN XL XLG (GOWN DISPOSABLE) ×3 IMPLANT
GOWN STRL REUS W/ TWL LRG LVL3 (GOWN DISPOSABLE) ×1 IMPLANT
GOWN STRL REUS W/TWL LRG LVL3 (GOWN DISPOSABLE) ×2
HANDPIECE INTERPULSE COAX TIP (DISPOSABLE) ×2
HOOD PEEL AWAY FLYTE STAYCOOL (MISCELLANEOUS) ×6 IMPLANT
KIT BASIN OR (CUSTOM PROCEDURE TRAY) ×3 IMPLANT
KIT ROOM TURNOVER OR (KITS) ×3 IMPLANT
KNEE CAPITATED TOTAL 4 (Knees) ×1 IMPLANT
MANIFOLD NEPTUNE II (INSTRUMENTS) ×3 IMPLANT
MARKER SKIN DUAL TIP RULER LAB (MISCELLANEOUS) ×3 IMPLANT
NEEDLE SPNL 18GX3.5 QUINCKE PK (NEEDLE) ×3 IMPLANT
NS IRRIG 1000ML POUR BTL (IV SOLUTION) ×3 IMPLANT
PACK TOTAL JOINT (CUSTOM PROCEDURE TRAY) ×3 IMPLANT
PAD ARMBOARD 7.5X6 YLW CONV (MISCELLANEOUS) ×6 IMPLANT
SAW OSC TIP CART 19.5X105X1.3 (SAW) ×3 IMPLANT
SET HNDPC FAN SPRY TIP SCT (DISPOSABLE) ×1 IMPLANT
STAPLER VISISTAT 35W (STAPLE) IMPLANT
STRIP CLOSURE SKIN 1/8X3 (GAUZE/BANDAGES/DRESSINGS) ×2 IMPLANT
SUCTION FRAZIER HANDLE 10FR (MISCELLANEOUS) ×4
SUCTION TUBE FRAZIER 10FR DISP (MISCELLANEOUS) ×2 IMPLANT
SUT ETHILON 2 0 FS 18 (SUTURE) IMPLANT
SUT MNCRL AB 4-0 PS2 18 (SUTURE) IMPLANT
SUT VIC AB 0 CT1 27 (SUTURE) ×4
SUT VIC AB 0 CT1 27XBRD ANBCTR (SUTURE) ×2 IMPLANT
SUT VIC AB 1 CTX 27 (SUTURE) ×9 IMPLANT
SUT VIC AB 2-0 CT1 27 (SUTURE) ×6
SUT VIC AB 2-0 CT1 TAPERPNT 27 (SUTURE) ×3 IMPLANT
SYR 50ML LL SCALE MARK (SYRINGE) ×3 IMPLANT
TOWEL OR 17X24 6PK STRL BLUE (TOWEL DISPOSABLE) ×3 IMPLANT
TOWEL OR 17X26 10 PK STRL BLUE (TOWEL DISPOSABLE) ×3 IMPLANT
TRAY CATH 16FR W/PLASTIC CATH (SET/KITS/TRAYS/PACK) IMPLANT
UNDERPAD 30X30 (UNDERPADS AND DIAPERS) ×3 IMPLANT
WRAP KNEE MAXI GEL POST OP (GAUZE/BANDAGES/DRESSINGS) ×3 IMPLANT

## 2017-04-17 NOTE — Anesthesia Procedure Notes (Signed)
Spinal  Patient location during procedure: OR End time: 04/17/2017 12:59 PM Staffing Anesthesiologist: Jairo BenJACKSON, Shelli Portilla Performed: anesthesiologist  Preanesthetic Checklist Completed: patient identified, site marked, surgical consent, pre-op evaluation, timeout performed, IV checked, risks and benefits discussed and monitors and equipment checked Spinal Block Patient position: sitting Prep: ChloraPrep and site prepped and draped Patient monitoring: heart rate, cardiac monitor, continuous pulse ox and blood pressure Approach: midline Location: L3-4 Injection technique: single-shot Needle Needle type: Quincke  Needle gauge: 22 G Needle length: 9 cm Additional Notes Pt identified in Operating room.  Monitors applied. Working IV access confirmed. Sterile prep, drape lumbar spine.  1% lido local L 3,4.  #25ga Quincke, unable to enter CSF, #22ga Quincke into clear CSF L 3,4.  15mg  0.75% Bupivacaine with dextrose injected with asp CSF beginning and end of injection.  Patient asymptomatic, VSS, no heme aspirated, tolerated well.  Sandford Craze Onyx Schirmer, MD

## 2017-04-17 NOTE — Progress Notes (Signed)
Orthopedic Tech Progress Note Patient Details:  David Dunlap 01-22-1971 [id number]  CPM Right Knee CPM Right Knee: On Right Knee Flexion (Degrees): 90 Right Knee Extension (Degrees): 0   [name], David Lords J 04/17/2017, 4:53 PM

## 2017-04-17 NOTE — Anesthesia Procedure Notes (Signed)
Anesthesia Regional Block: Adductor canal block   Pre-Anesthetic Checklist: ,, timeout performed, Correct Patient, Correct Site, Correct Laterality, Correct Procedure, Correct Position, site marked, Risks and benefits discussed,  Surgical consent,  Pre-op evaluation,  At surgeon's request and post-op pain management  Laterality: Right and Lower  Prep: chloraprep       Needles:   Needle Type: Echogenic Needle     Needle Length: 9cm  Needle Gauge: 21     Additional Needles:   Procedures: ultrasound guided,,,,,,,,  Narrative:  Start time: 04/17/2017 11:57 AM End time: 04/17/2017 12:03 PM Injection made incrementally with aspirations every 5 mL.  Performed by: Personally  Anesthesiologist: Jean RosenthalJACKSON, Jennise Both  Additional Notes: Pt identified in Holding room.  Monitors applied. Working IV access confirmed. Sterile prep, drape .  #21ga ECHOgenic needle into adductor canal with US guidance.  30cc 0.5% Ropivacaine injected incrementally after negative test dose.  Patient asymptomatic, VSS, no heme aspirated, tolerated well.  Sandford Craze Cashius Grandstaff, MD

## 2017-04-17 NOTE — Anesthesia Postprocedure Evaluation (Signed)
Anesthesia Post Note  Patient: Lucio EdwardKeith D Wolfley  Procedure(s) Performed: Procedure(s) (LRB): RIGHT TOTAL KNEE ARTHROPLASTY (Right)     Patient location during evaluation: PACU Anesthesia Type: Regional and Spinal Level of consciousness: awake and alert, patient cooperative and oriented Pain management: pain level controlled Vital Signs Assessment: post-procedure vital signs reviewed and stable Respiratory status: spontaneous breathing, nonlabored ventilation and respiratory function stable Cardiovascular status: blood pressure returned to baseline and stable Postop Assessment: no backache, spinal receding, patient able to bend at knees, no headache and no signs of nausea or vomiting Anesthetic complications: no    Last Vitals:  Vitals:   04/17/17 1605 04/17/17 1620  BP: (!) 109/94 122/85  Pulse: 78 65  Resp: 19 18  Temp:      Last Pain:  Vitals:   04/17/17 1551  TempSrc:   PainSc: 0-No pain    LLE Motor Response: Purposeful movement;Responds to commands (04/17/17 1620) LLE Sensation: Decreased (04/17/17 1620) RLE Motor Response: Purposeful movement;Responds to commands (04/17/17 1620) RLE Sensation: Decreased (04/17/17 1620) L Sensory Level: S1-Sole of foot, small toes (04/17/17 1620) R Sensory Level: S1-Sole of foot, small toes (04/17/17 1620)  Winfred Redel,E. Osinachi Navarrette

## 2017-04-17 NOTE — Op Note (Signed)
Total Knee Arthroplasty Procedure Note  Preoperative diagnosis: Right knee osteoarthritis  Postoperative diagnosis:same  Operative procedure: Right total knee arthroplasty. CPT [phone]  Surgeon: N. Glee ArvinMichael Xu, MD  Assistants: Hart CarwinJustin Queen, RNFA  Anesthesia: Spinal, regional  Tourniquet time: 97 mins  Implants used: Zimmer Persona Uncemented Femur: 8 PS Tibia: F Patella: 32 mm, 10 thick Polyethylene: 12 mm  Indication: David Dunlap is a 46 y.o. year old male with a history of knee pain. Having failed conservative management, the patient elected to proceed with a total knee arthroplasty.  We have reviewed the risk and benefits of the surgery and they elected to proceed after voicing understanding.  Procedure:  After informed consent was obtained and understanding of the risk were voiced including but not limited to bleeding, infection, damage to surrounding structures including nerves and vessels, blood clots, leg length inequality and the failure to achieve desired results, the operative extremity was marked with verbal confirmation of the patient in the holding area.   The patient was then brought to the operating room and transported to the operating room table in the supine position.  A tourniquet was applied to the operative extremity around the upper thigh. The operative limb was then prepped and draped in the usual sterile fashion and preoperative antibiotics were administered.  A time out was performed prior to the start of surgery confirming the correct extremity, preoperative antibiotic administration, as well as team members, implants and instruments available for the case. Correct surgical site was also confirmed with preoperative radiographs. The limb was then elevated for exsanguination and the tourniquet was inflated. A midline incision was made and a standard medial parapatellar approach was performed.  The patella was prepared and sized to a 32 mm.  A cover was  placed on the patella for protection from retractors.  We then turned our attention to the femur. Posterior cruciate ligament was sacrificed. Start site was drilled in the femur and the intramedullary distal femoral cutting guide was placed, set at 5 degrees valgus, taking 12 mm of distal resection. The distal cut was made. Osteophytes were then removed. Next, the proximal tibial cutting guide was placed with appropriate slope, varus/valgus alignment and depth of resection. The proximal tibial cut was made. Gap blocks were then used to assess the extension gap and alignment, and appropriate soft tissue releases were performed. Attention was turned back to the femur, which was sized using the sizing guide to a size 8. Appropriate rotation of the femoral component was determined using epicondylar axis, Whiteside's line, and assessing the flexion gap under ligament tension. The appropriate size 4-in-1 cutting block was placed and cuts were made. Posterior femoral osteophytes and uncapped bone were then removed with the curved osteotome. The tibia was sized for a size F component. The femoral box-cutting guide was placed and prepared for a PS femoral component. Trial components were placed, and stability was checked in full extension, mid-flexion, and deep flexion. Proper tibial rotation was determined and marked.  The patella tracked well without a lateral release. Trial components were then removed and tibial preparation performed. A posterior capsular injection comprising of 20 cc of 1.3% exparel and 40 cc of normal saline was performed for postoperative pain control. The bony surfaces were irrigated with a pulse lavage and then dried. Bone cement was vacuum mixed on the back table, and the final components sized above were cemented into place. After cement had finished curing, excess cement was removed. The stability of the construct was  re-evaluated throughout a range of motion and found to be acceptable. The  trial liner was removed, the knee was copiously irrigated, and the knee was re-evaluated for any excess bone debris. The real polyethylene liner, 12 mm thick, was inserted and checked to ensure the locking mechanism had engaged appropriately. The tourniquet was deflated and hemostasis was achieved. The wound was irrigated with dilute betadine in normal saline, and then again with normal saline. A drain was not placed. Capsular closure was performed with a #1 vicryl, subcutaneous fat closed with a 0 vicryl suture, then subcutaneous tissue closed with interrupted 2.0 vicryl suture. The skin was then closed with a 3.0 monocryl. A sterile dressing was applied.   The patient was awakened in the operating room and taken to recovery in stable condition. All sponge, needle, and instrument counts were correct at the end of the case.  Position: supine  Complications: none.  Time Out: performed   Drains/Packing: none  Estimated blood loss: minimal  Returned to Recovery Room: in good condition.   Antibiotics: yes   Mechanical VTE (DVT) Prophylaxis: sequential compression devices, TED thigh-high  Chemical VTE (DVT) Prophylaxis: xarelto  Fluid Replacement  Crystalloid: see anesthesia record Blood: none  FFP: none   Specimens Removed: 1 to pathology   Sponge and Instrument Count Correct? yes   PACU: portable radiograph - knee AP and Lateral   Admission: inpatient status  Plan/RTC: Return in 2 weeks for wound check.   Weight Bearing/Load Lower Extremity: full   N. Glee ArvinMichael Xu, MD [facility] [phone] 3:04 PM

## 2017-04-17 NOTE — Transfer of Care (Signed)
Immediate Anesthesia Transfer of Care Note  Patient: David Dunlap  Procedure(s) Performed: Procedure(s): RIGHT TOTAL KNEE ARTHROPLASTY (Right)  Patient Location: PACU  Anesthesia Type:MAC, Regional and Spinal  Level of Consciousness: awake, alert , oriented and patient cooperative  Airway & Oxygen Therapy: Patient Spontanous Breathing and Patient connected to nasal cannula oxygen  Post-op Assessment: Report given to RN and Post -op Vital signs reviewed and stable  Post vital signs: Reviewed and stable  Last Vitals:  Vitals:   04/17/17 1210 04/17/17 1215  BP: (!) 145/71 137/77  Pulse: 72 82  Resp: 14 17  Temp:      Last Pain:  Vitals:   04/17/17 1215  TempSrc:   PainSc: 6          Complications: No apparent anesthesia complications

## 2017-04-17 NOTE — Evaluation (Signed)
Physical Therapy Evaluation Patient Details Name: David Dunlap MRN: [id number] DOB: 08/16/1971 Today's Date: 04/17/2017   History of Present Illness  Patient is a 46 yo male admitted 04/17/17 now s/p Rt TKA.   PMH:  OA, depression, DM, HTN  Clinical Impression  Patient presents with problems listed below.  Will benefit from acute PT to maximize functional mobility prior to discharge home with family.  Recommend HHPT at d/c for continued therapy.    Follow Up Recommendations Home health PT;Supervision for mobility/OOB    Equipment Recommendations  Rolling walker with 5" wheels;3in1 (PT)    Recommendations for Other Services       Precautions / Restrictions Precautions Precautions: Knee Precaution Booklet Issued: Yes (comment) Precaution Comments: Reviewed knee precautions with patient and girlfriend Restrictions Weight Bearing Restrictions: Yes RLE Weight Bearing: Weight bearing as tolerated      Mobility  Bed Mobility Overal bed mobility: Needs Assistance Bed Mobility: Supine to Sit;Sit to Supine     Supine to sit: Min assist Sit to supine: Min assist   General bed mobility comments: Verbal cues for technique.  [facility] to [facility] RLE off of and onto bed.  Transfers Overall transfer level: Needs assistance Equipment used: Rolling walker (2 wheeled) Transfers: Sit to/from Stand Sit to Stand: Min assist         General transfer comment: Verbal cues for hand placement.  Patient required assist to rise to stance and for balance.  Decreased control Rt knee.  Patient able to stand 60 seconds.  Did not feel comfortable trying to take a step.  Returned to sitting with assist to control descent.  Ambulation/Gait             General Gait Details: NT  Stairs            Wheelchair Mobility    Modified Rankin (Stroke Patients Only)       Balance                                             Pertinent Vitals/Pain Pain Assessment:  0-10 Pain Score: 9  Pain Location: Rt knee Pain Descriptors / Indicators: Aching;Sore Pain Intervention(s): Limited activity within patient's tolerance;Monitored during session;Repositioned;Patient requesting pain meds-RN notified    Home Living Family/patient expects to be discharged to:: Private residence Living Arrangements: Spouse/significant other;Children;Other relatives Available Help at Discharge: Family;Available 24 hours/day Type of Home: House Home Access: Stairs to enter Entrance Stairs-Rails: Doctor, general practiceight;Left Entrance Stairs-Number of Steps: 7 Home Layout: One level Home Equipment: None      Prior Function Level of Independence: Independent               Hand Dominance        Extremity/Trunk Assessment   Upper Extremity Assessment Upper Extremity Assessment: Overall WFL for tasks assessed    Lower Extremity Assessment Lower Extremity Assessment: RLE deficits/detail RLE Deficits / Details: Decreased strength and ROM post op RLE: Unable to fully assess due to pain       Communication   Communication: No difficulties  Cognition Arousal/Alertness: Awake/alert Behavior During Therapy: WFL for tasks assessed/performed Overall Cognitive Status: Within Functional Limits for tasks assessed  General Comments      Exercises     Assessment/Plan    PT Assessment Patient needs continued PT services  PT Problem List Decreased strength;Decreased range of motion;Decreased activity tolerance;Decreased balance;Decreased mobility;Decreased knowledge of use of DME;Decreased knowledge of precautions;Pain       PT Treatment Interventions DME instruction;Gait training;Stair training;Functional mobility training;Therapeutic exercise;Patient/family education    PT Goals (Current goals can be found in the Care Plan section)  Acute Rehab PT Goals Patient Stated Goal: To decrease pain PT Goal Formulation: With  patient/family Time For Goal Achievement: 04/24/17 Potential to Achieve Goals: Good    Frequency 7X/week   Barriers to discharge Inaccessible home environment Seven stairs to enter home.    Co-evaluation               AM-PAC PT "6 Clicks" Daily Activity  Outcome Measure Difficulty turning over in bed (including adjusting bedclothes, sheets and blankets)?: None Difficulty moving from lying on back to sitting on the side of the bed? : Total Difficulty sitting down on and standing up from a chair with arms (e.g., wheelchair, bedside commode, etc,.)?: Total Help needed moving to and from a bed to chair (including a wheelchair)?: A Little Help needed walking in hospital room?: A Little Help needed climbing 3-5 steps with a railing? : A Lot 6 Click Score: 14    End of Session Equipment Utilized During Treatment: Gait belt Activity Tolerance: Patient limited by pain Patient left: in bed;with call bell/phone within reach;with family/visitor present Nurse Communication: Mobility status;Patient requests pain meds PT Visit Diagnosis: Unsteadiness on feet (R26.81);Other abnormalities of gait and mobility (R26.89);Muscle weakness (generalized) (M62.81);Pain Pain - Right/Left: Right Pain - part of body: Knee    Time: 1810-1827 PT Time Calculation (min) (ACUTE ONLY): 17 min   Charges:   PT Evaluation $PT Eval Moderate Complexity: 1 Mod     PT G Codes:        Durenda HurtSusan H. Renaldo Fiddleravis, PT, [facility] Acute Rehab Services Pager [phone]   Vena AustriaSusan H Ruslan Mccabe 04/17/2017, 8:09 PM

## 2017-04-17 NOTE — Anesthesia Preprocedure Evaluation (Addendum)
Anesthesia Evaluation  Patient identified by MRN, date of birth, ID band Patient awake    Reviewed: Allergy & Precautions, NPO status , Patient's Chart, lab work & pertinent test results  History of Anesthesia Complications Negative for: history of anesthetic complications  Airway Mallampati: II  TM Distance: >3 FB Neck ROM: Full    Dental  (+) Dental Advisory Given, Missing, Chipped   Pulmonary neg pulmonary ROS,    breath sounds clear to auscultation       Cardiovascular hypertension, Pt. on medications (-) angina+ DVT   Rhythm:Regular Rate:Normal  6/18 ECHO:  normal LVF, valves OK   Neuro/Psych negative neurological ROS     GI/Hepatic Neg liver ROS, GERD  Medicated and Controlled,  Endo/Other  diabetes (glu 140), Oral Hypoglycemic AgentsMorbid obesity  Renal/GU negative Renal ROS     Musculoskeletal  (+) Arthritis ,   Abdominal (+) + obese,   Peds  Hematology Xarelto: last dose Saturday   Anesthesia Other Findings   Reproductive/Obstetrics                            Anesthesia Physical Anesthesia Plan  ASA: III  Anesthesia Plan: Spinal   Post-op Pain Management:  Regional for Post-op pain   Induction:   PONV Risk Score and Plan: 1 and Ondansetron and Dexamethasone  Airway Management Planned: Natural Airway and Simple Face Mask  Additional Equipment:   Intra-op Plan:   Post-operative Plan:   Informed Consent: I have reviewed the patients History and Physical, chart, labs and discussed the procedure including the risks, benefits and alternatives for the proposed anesthesia with the patient or authorized representative who has indicated his/her understanding and acceptance.   Dental advisory given  Plan Discussed with: CRNA and Surgeon  Anesthesia Plan Comments: (Plan routine monitors, SAB with adductor canal block for post op analgesia)        Anesthesia Quick  Evaluation

## 2017-04-17 NOTE — H&P (Signed)
PREOPERATIVE H&P  Chief Complaint: right knee degenerative joint disease  HPI: David Dunlap is a 46 y.o. male who presents for surgical treatment of right knee degenerative joint disease.  He denies any changes in medical history.  Past Medical History:  Diagnosis Date  . Arthritis   . Depression   . Diabetes mellitus without complication (HCC) 05/2016   NEW ONSET    WITH FAMILY HISTORY  . DVT (deep venous thrombosis) (HCC)   . Essential hypertension   . Gallstones 05/2016  . GERD (gastroesophageal reflux disease)    Past Surgical History:  Procedure Laterality Date  . CHOLECYSTECTOMY N/A 06/07/2016   Procedure: LAPAROSCOPIC CHOLECYSTECTOMY WITH ATTEMPTED INTRAOPERATIVE CHOLANGIOGRAM;  Surgeon: Violeta GelinasBurke Thompson, MD;  Location: [facility] OR;  Service: General;  Laterality: N/A;  . LIGAMENT REPAIR     R forearm   Social History   Social History  . Marital status: Single    Spouse name: N/A  . Number of children: N/A  . Years of education: N/A   Social History Main Topics  . Smoking status: Never Smoker  . Smokeless tobacco: Never Used  . Alcohol use 0.6 - 1.2 oz/week    1 - 2 Cans of beer per week     Comment: doesn't drink all the drink  . Drug use: No     Comment: last time 3 months ago ( back in 1989)  . Sexual activity: Not on file   Other Topics Concern  . Not on file   Social History Narrative  . No narrative on file   Family History  Problem Relation Age of Onset  . Diabetes Mother   . Diabetes Father   . Hypertension Sister    No Known Allergies Prior to Admission medications   Medication Sig Start Date End Date Taking? Authorizing Provider  acetaminophen-codeine (TYLENOL #3) 300-30 MG tablet Take 1 tablet by mouth every 12 (twelve) hours as needed for moderate pain. 03/11/17  Yes Jaclyn ShaggyAmao, Enobong, MD  aspirin EC 81 MG tablet Take 81 mg by mouth daily.   Yes [provider]  cetirizine (ZYRTEC) 10 MG tablet Take 1 tablet (10 mg total) by mouth  daily. 03/11/17  Yes Jaclyn ShaggyAmao, Enobong, MD  famotidine (PEPCID) 20 MG tablet Take 1 tablet (20 mg total) by mouth daily as needed for heartburn. 03/11/17 04/10/17 Yes Jaclyn ShaggyAmao, Enobong, MD  FLUoxetine (PROZAC) 20 MG tablet Take 2 tablets (40 mg total) by mouth daily. 03/11/17  Yes Jaclyn ShaggyAmao, Enobong, MD  gabapentin (NEURONTIN) 300 MG capsule Take 1 capsule (300 mg total) by mouth 3 (three) times daily. 03/11/17  Yes Jaclyn ShaggyAmao, Enobong, MD  glipiZIDE (GLUCOTROL) 5 MG tablet Take 1 tablet (5 mg total) by mouth 2 (two) times daily before a meal. 03/11/17  Yes Amao, Enobong, MD  hydrochlorothiazide (HYDRODIURIL) 25 MG tablet Take 1 tablet (25 mg total) by mouth daily. 03/11/17  Yes Jaclyn ShaggyAmao, Enobong, MD  lisinopril (PRINIVIL,ZESTRIL) 10 MG tablet Take 1 tablet (10 mg total) by mouth daily. 03/11/17  Yes Jaclyn ShaggyAmao, Enobong, MD  metFORMIN (GLUCOPHAGE) 500 MG tablet Take 2 tablets (1,000 mg total) by mouth 2 (two) times daily with a meal. 03/11/17  Yes Amao, Odette HornsEnobong, MD  rivaroxaban (XARELTO) 20 MG TABS tablet Take 1 tablet (20 mg total) by mouth daily with supper. 01/08/17  Yes Jaclyn ShaggyAmao, Enobong, MD  Blood Glucose Monitoring Suppl (TRUE METRIX METER) DEVI 1 each by Does not apply route 3 (three) times daily before meals. Patient not taking: Reported on  04/08/2017 07/25/16   Jaclyn ShaggyAmao, Enobong, MD  glucose blood (TRUE METRIX BLOOD GLUCOSE TEST) test strip Used daily before meals Patient not taking: Reported on 04/08/2017 07/25/16   Jaclyn ShaggyAmao, Enobong, MD  TRUEPLUS LANCETS 28G MISC Use daily before meals Patient not taking: Reported on 04/08/2017 07/25/16   Jaclyn ShaggyAmao, Enobong, MD     Positive ROS: All other systems have been reviewed and were otherwise negative with the exception of those mentioned in the HPI and as above.  Physical Exam: General: Alert, no acute distress Cardiovascular: No pedal edema Respiratory: No cyanosis, no use of accessory musculature GI: abdomen soft Skin: No lesions in the area of chief complaint Neurologic: Sensation intact  distally Psychiatric: Patient is competent for consent with normal mood and affect Lymphatic: no lymphedema  MUSCULOSKELETAL: exam stable  Assessment: right knee degenerative joint disease  Plan: Plan for Procedure(s): RIGHT TOTAL KNEE ARTHROPLASTY  The risks benefits and alternatives were discussed with the patient including but not limited to the risks of nonoperative treatment, versus surgical intervention including infection, bleeding, nerve injury,  blood clots, cardiopulmonary complications, morbidity, mortality, among others, and they were willing to proceed.   Glee ArvinMichael Lainie Daubert, MD   04/17/2017 10:12 AM

## 2017-04-18 LAB — BASIC METABOLIC PANEL
Anion gap: 6 (ref 5–15)
BUN: 17 mg/dL (ref 6–20)
CHLORIDE: 103 mmol/L (ref 101–111)
CO2: 26 mmol/L (ref 22–32)
CREATININE: 1.17 mg/dL (ref 0.61–1.24)
Calcium: 8.5 mg/dL — ABNORMAL LOW (ref 8.9–10.3)
GFR calc Af Amer: >60 mL/min (ref >60)
GFR calc non Af Amer: >60 mL/min (ref >60)
Glucose, Bld: 195 mg/dL — ABNORMAL HIGH (ref 65–99)
Potassium: 4.8 mmol/L (ref 3.5–5.1)
SODIUM: 135 mmol/L (ref 135–145)

## 2017-04-18 LAB — GLUCOSE, CAPILLARY
GLUCOSE-CAPILLARY: 175 mg/dL — AB (ref 65–99)
Glucose-Capillary: 226 mg/dL — ABNORMAL HIGH (ref 65–99)
Glucose-Capillary: 285 mg/dL — ABNORMAL HIGH (ref 65–99)
Glucose-Capillary: 245 mg/dL — ABNORMAL HIGH (ref 65–99)

## 2017-04-18 LAB — CBC
HCT: 36.1 % — ABNORMAL LOW (ref 39.0–52.0)
HEMOGLOBIN: 12.4 g/dL — AB (ref 13.0–17.0)
MCH: 28.1 pg (ref 26.0–34.0)
MCHC: 34.3 g/dL (ref 30.0–36.0)
MCV: 81.7 fL (ref 78.0–100.0)
Platelets: 160 10*3/uL (ref 150–400)
RBC: 4.42 MIL/uL (ref 4.22–5.81)
RDW: 13.8 % (ref 11.5–15.5)
WBC: 14.2 10*3/uL — ABNORMAL HIGH (ref 4.0–10.5)

## 2017-04-18 NOTE — Progress Notes (Signed)
   Subjective:  Patient reports pain as moderate.  No events.  Objective:   VITALS:   Vitals:   04/17/17 2017 04/18/17 0000 04/18/17 0100 04/18/17 0425  BP: 127/68 (!) 103/59  112/62  Pulse: 73 67  61  Resp: 18 16  18   Temp: 97.9 F (36.6 C) 98 F (36.7 C)  98 F (36.7 C)  TempSrc: Oral Oral  Oral  SpO2: 99% 98%  100%  Weight:   282 lb (127.9 kg)   Height:   5\' 9"  (1.753 m)     Neurologically intact Neurovascular intact Sensation intact distally Intact pulses distally Dorsiflexion/Plantar flexion intact Incision: dressing C/D/I and no drainage No cellulitis present Compartment soft   Lab Results  Component Value Date   WBC 14.2 (H) 04/18/2017   HGB 12.4 (L) 04/18/2017   HCT 36.1 (L) 04/18/2017   MCV 81.7 04/18/2017   PLT 160 04/18/2017     Assessment/Plan:  1 Day Post-Op   - Expected postop acute blood loss anemia - will monitor for symptoms - Up with PT/OT - DVT ppx - SCDs, ambulation, xarelto - WBAT operative extremity - Pain control - SSI - Discharge planning - likely home friday  Glee ArvinMichael Bassel Gaskill 04/18/2017, 7:16 AM [phone]

## 2017-04-18 NOTE — Care Management Note (Signed)
Case Management Note  Patient Details  Name: David Dunlap MRN: [id number] Date of Birth: 04-24-1971  Subjective/Objective:     46 yr old gentleman s/p right total knee arthroplasty.               Action/Plan:  Patient will need Home Health therapy, patient is uninsured and will need to be setup with [facility]. CM called referral to Janeice RobinsonKaren Nusbaumm, [facility] Liaison.    Expected Discharge Date:   04/19/17               Expected Discharge Plan:  Home w Home Health Services  In-House Referral:  NA  Discharge planning Services  CM Consult  Post Acute Care Choice:  Durable Medical Equipment, Home Health Choice offered to:  Patient  DME Arranged:  Walker rolling DME Agency:  [facility]  [facility]  Status of Service:  In process, will continue to follow  If discussed at Long Length of Stay Meetings, dates discussed:    Additional Comments:  David Dunlap, David Stebner Naomi, RN 04/18/2017, 3:35 PM

## 2017-04-18 NOTE — Progress Notes (Signed)
Physical Therapy Treatment Patient Details Name: David Dunlap D Lute MRN: [id number] DOB: [birth year] Today's Date: 04/18/2017    History of Present Illness Patient is a 46 yo male admitted 04/17/17 now s/p Rt TKA.   PMH:  OA, depression, DM, HTN    PT Comments    Pt is progressing well with mobility, he ambulated 2990' with RW. Performed TKA exercises with min A.   Follow Up Recommendations  Home health PT;Supervision for mobility/OOB     Equipment Recommendations  Rolling walker with 5" wheels;3in1 (PT)    Recommendations for Other Services       Precautions / Restrictions Precautions Precautions: Knee Precaution Booklet Issued: Yes (comment) Precaution Comments: Reviewed knee precautions with patient  Restrictions Weight Bearing Restrictions: No RLE Weight Bearing: Weight bearing as tolerated    Mobility  Bed Mobility               General bed mobility comments: up in recliner  Transfers Overall transfer level: Needs assistance Equipment used: Rolling walker (2 wheeled) Transfers: Sit to/from Stand Sit to Stand: Min guard         General transfer comment: Verbal cues for hand placement.    Ambulation/Gait Ambulation/Gait assistance: Min guard Ambulation Distance (Feet): 90 Feet Assistive device: Rolling walker (2 wheeled) Gait Pattern/deviations: Step-to pattern;Decreased step length - right;Decreased weight shift to right;Antalgic   Gait velocity interpretation: Below normal speed for age/gender General Gait Details: distance limited by pain, steady with RW, good sequencing   Stairs            Wheelchair Mobility    Modified Rankin (Stroke Patients Only)       Balance Overall balance assessment: Modified Independent                                          Cognition Arousal/Alertness: Awake/alert Behavior During Therapy: WFL for tasks assessed/performed Overall Cognitive Status: Within Functional Limits for tasks  assessed                                        Exercises Total Joint Exercises Ankle Circles/Pumps: AROM;Both;10 reps;Supine Quad Sets: AROM;Right;10 reps;Supine Short Arc Quad: AAROM;Right;10 reps;Supine Heel Slides: AAROM;Right;10 reps;Supine Straight Leg Raises: AAROM;Right;10 reps;Supine Goniometric ROM: 5-45* AAROM R knee    General Comments        Pertinent Vitals/Pain Pain Assessment: 0-10 Pain Score: 9  Pain Location: Rt knee walking Pain Descriptors / Indicators: Aching;Sore Pain Intervention(s): Limited activity within patient's tolerance;Monitored during session;Premedicated before session;Ice applied    Home Living Family/patient expects to be discharged to:: Private residence Living Arrangements: Spouse/significant other;Children;Other relatives Available Help at Discharge: Family;Available 24 hours/day Type of Home: House Home Access: Stairs to enter Entrance Stairs-Rails: Right;Left Home Layout: One level Home Equipment: None      Prior Function Level of Independence: Independent          PT Goals (current goals can now be found in the care plan section) Acute Rehab PT Goals Patient Stated Goal: play with his 366 and 48 y.o. sons PT Goal Formulation: With patient Time For Goal Achievement: 04/24/17 Potential to Achieve Goals: Good Progress towards PT goals: Progressing toward goals    Frequency    7X/week      PT Plan Current plan remains appropriate  Co-evaluation              AM-PAC PT "6 Clicks" Daily Activity  Outcome Measure  Difficulty turning over in bed (including adjusting bedclothes, sheets and blankets)?: None Difficulty moving from lying on back to sitting on the side of the bed? : A Lot Difficulty sitting down on and standing up from a chair with arms (e.g., wheelchair, bedside commode, etc,.)?: A Lot Help needed moving to and from a bed to chair (including a wheelchair)?: A Little Help needed walking  in hospital room?: A Little Help needed climbing 3-5 steps with a railing? : A Lot 6 Click Score: 16    End of Session Equipment Utilized During Treatment: Gait belt Activity Tolerance: Patient tolerated treatment well Patient left: with call bell/phone within reach;in chair;with chair alarm set Nurse Communication: Mobility status PT Visit Diagnosis: Unsteadiness on feet (R26.81);Other abnormalities of gait and mobility (R26.89);Muscle weakness (generalized) (M62.81);Pain Pain - Right/Left: Right Pain - part of body: Knee     Time: [id number] PT Time Calculation (min) (ACUTE ONLY): 23 min  Charges:  $Gait Training: 8-22 mins $Therapeutic Exercise: 8-22 mins                    G Codes:         Tamala SerUhlenberg, Ledger Heindl Kistler 04/18/2017, 9:38 AM [phone]

## 2017-04-18 NOTE — Evaluation (Signed)
Occupational Therapy Evaluation Patient Details Name: David Dunlap MRN: [id number] DOB: May 09, 1971 Today's Date: 04/18/2017    History of Present Illness Patient is a 46 yo male admitted 04/17/17 now s/p Rt TKA.   PMH:  OA, depression, DM, HTN   Clinical Impression   This 46 y/o M presents with the above. At baseline Pt reports he is independent with ADLs and functional mobility. Pt currently requires MinA for functional mobility and MaxA for LB ADLs. Pt lives with his girlfriend who will be able to provide ADL assist PRN. Pt will benefit from continued OT services while in acute setting to maximize Pt's safety and independence with ADLs and functional mobility prior to return home.     Follow Up Recommendations  DC plan and follow up therapy as arranged by surgeon;Supervision/Assistance - 24 hour    Equipment Recommendations  3 in 1 bedside commode           Precautions / Restrictions Precautions Precautions: Knee Precaution Booklet Issued: Yes (comment) Precaution Comments: Reviewed knee precautions with patient  Restrictions Weight Bearing Restrictions: Yes RLE Weight Bearing: Weight bearing as tolerated      Mobility Bed Mobility Overal bed mobility: Needs Assistance Bed Mobility: Supine to Sit     Supine to sit: Min assist     General bed mobility comments: minA for R LE management   Transfers Overall transfer level: Needs assistance Equipment used: Rolling walker (2 wheeled) Transfers: Sit to/from Stand Sit to Stand: Min assist         General transfer comment: Verbal cues for hand/LE placement, assist to rise     Balance Overall balance assessment: Needs assistance Sitting-balance support: Feet supported;Single extremity supported Sitting balance-Leahy Scale: Good     Standing balance support: Bilateral upper extremity supported;During functional activity Standing balance-Leahy Scale: Fair Standing balance comment: Pt able to perform oral care  standing at sink with minGuard throughout; reliant on UE support during mobility                            ADL either performed or assessed with clinical judgement   ADL Overall ADL's : Needs assistance/impaired Eating/Feeding: Independent;Sitting   Grooming: Oral care;Min guard;Standing   Upper Body Bathing: Min guard;Sitting   Lower Body Bathing: Moderate assistance;Sit to/from stand   Upper Body Dressing : Min guard;Sitting   Lower Body Dressing: Maximal assistance;Sit to/from stand   Toilet Transfer: Minimal assistance;Ambulation;BSC;RW Toilet Transfer Details (indicate cue type and reason): BSC over toilet  Toileting- Clothing Manipulation and Hygiene: Minimal assistance;Sit to/from Nurse, children'sstand     Tub/Shower Transfer Details (indicate cue type and reason): educated on use of 3:1 as shower seat in tub; plan to educated and practice tub transfer next OT session Functional mobility during ADLs: Minimal assistance;Rolling walker                           Pertinent Vitals/Pain Pain Assessment: Faces Pain Score: 9  Faces Pain Scale: Hurts a little bit Pain Location: R knee  Pain Descriptors / Indicators: Aching;Sore Pain Intervention(s): Limited activity within patient's tolerance;Monitored during session;Premedicated before session;Ice applied;Repositioned          Extremity/Trunk Assessment Upper Extremity Assessment Upper Extremity Assessment: Overall WFL for tasks assessed   Lower Extremity Assessment Lower Extremity Assessment: Defer to PT evaluation       Communication Communication Communication: No difficulties   Cognition Arousal/Alertness: Awake/alert Behavior  During Therapy: WFL for tasks assessed/performed Overall Cognitive Status: Within Functional Limits for tasks assessed                                                     Home Living Family/patient expects to be discharged to:: Private residence Living  Arrangements: Spouse/significant other;Children;Other relatives Available Help at Discharge: Family;Available 24 hours/day Type of Home: House Home Access: Stairs to enter Enter[employer] CorporationEntrance Stairs-Number of Steps: 7 Entrance Stairs-Rails: Right;Left Home Layout: One level     Bathroom Shower/Tub: Chief Strategy OfficerTub/shower unit   Bathroom Toilet: Standard     Home Equipment: None          Prior Functioning/Environment Level of Independence: Independent                 OT Problem List: Decreased strength;Decreased activity tolerance;Decreased knowledge of use of DME or AE;Decreased knowledge of precautions      OT Treatment/Interventions: Self-care/ADL training;DME and/or AE instruction;Therapeutic activities;Balance training;Therapeutic exercise;Neuromuscular education;Patient/family education;Energy conservation    OT Goals(Current goals can be found in the care plan section) Acute Rehab OT Goals Patient Stated Goal: play with his 56 and 48 y.o. sons OT Goal Formulation: With patient Time For Goal Achievement: 05/02/17 Potential to Achieve Goals: Good  OT Frequency: Min 2X/week                             AM-PAC PT "6 Clicks" Daily Activity     Outcome Measure Help from another person eating meals?: None Help from another person taking care of personal grooming?: A Little Help from another person toileting, which includes using toliet, bedpan, or urinal?: A Little Help from another person bathing (including washing, rinsing, drying)?: A Lot Help from another person to put on and taking off regular upper body clothing?: None Help from another person to put on and taking off regular lower body clothing?: A Lot 6 Click Score: 18   End of Session Equipment Utilized During Treatment: Gait belt;Rolling walker Nurse Communication: Mobility status  Activity Tolerance: Patient tolerated treatment well Patient left: in chair;with call bell/phone within reach;with chair alarm set  OT  Visit Diagnosis: Muscle weakness (generalized) (M62.81);Unsteadiness on feet (R26.81)                Time: [id number] OT Time Calculation (min): 37 min Charges:  OT General Charges $OT Visit: 1 Procedure OT Evaluation $OT Eval Low Complexity: 1 Procedure OT Treatments $Self Care/Home Management : 8-22 mins G-Codes:     Marcy SirenBreanna Fabrizzio Dunlap, OT Pager [phone] 04/18/2017   David PennerBreanna L Gracelynne Dunlap 04/18/2017, 12:35 PM

## 2017-04-18 NOTE — Progress Notes (Signed)
Orthopedic Tech Progress Note Patient Details:  David Dunlap [birth year] [id number]  Patient ID: David Dunlap, male   DOB: [birth year], 46 y.o.   MRN: [id number] Pt will call if wants to go in cpm.  [name], [name] 04/18/2017, 8:42 PM

## 2017-04-18 NOTE — Progress Notes (Signed)
Physical Therapy Treatment Patient Details Name: David Dunlap MRN: [id number] DOB: 1970-12-23 Today's Date: 04/18/2017    History of Present Illness Patient is a 46 yo male admitted 04/17/17 now s/p Rt TKA.   PMH:  OA, depression, DM, HTN    PT Comments    Pt ambulated 110' with RW, distance limited by pain. Progressing well with mobility.   Follow Up Recommendations  Home health PT;Supervision for mobility/OOB     Equipment Recommendations  Rolling walker with 5" wheels;3in1 (PT)    Recommendations for Other Services       Precautions / Restrictions Precautions Precautions: Knee Precaution Booklet Issued: Yes (comment) Precaution Comments: Reviewed knee precautions with patient  Restrictions Weight Bearing Restrictions: Yes RLE Weight Bearing: Weight bearing as tolerated    Mobility  Bed Mobility Overal bed mobility: Needs Assistance Bed Mobility: Supine to Sit     Supine to sit: Min assist     General bed mobility comments: up in recliner  Transfers Overall transfer level: Needs assistance Equipment used: Rolling walker (2 wheeled) Transfers: Sit to/from Stand Sit to Stand: Min assist         General transfer comment: Verbal cues for hand/LE placement, assist to rise   Ambulation/Gait Ambulation/Gait assistance: Supervision;Min guard Ambulation Distance (Feet): 110 Feet Assistive device: Rolling walker (2 wheeled) Gait Pattern/deviations: Step-to pattern;Decreased step length - right;Decreased weight shift to right;Antalgic   Gait velocity interpretation: Below normal speed for age/gender General Gait Details: distance limited by pain, steady with RW, good sequencing   Stairs            Wheelchair Mobility    Modified Rankin (Stroke Patients Only)       Balance Overall balance assessment: Needs assistance Sitting-balance support: Feet supported;Single extremity supported Sitting balance-Leahy Scale: Good     Standing balance  support: Bilateral upper extremity supported;During functional activity Standing balance-Leahy Scale: Fair Standing balance comment: Pt able to perform oral care standing at sink with minGuard throughout; reliant on UE support during mobility                             Cognition Arousal/Alertness: Awake/alert Behavior During Therapy: WFL for tasks assessed/performed Overall Cognitive Status: Within Functional Limits for tasks assessed                                        Exercises Deferred -pt's lunch arrived and he wanted to eat    General Comments        Pertinent Vitals/Pain Pain Assessment: Faces Pain Score: 9  Faces Pain Scale: Hurts a little bit Pain Location: R knee with walking Pain Descriptors / Indicators: Aching;Sore Pain Intervention(s): Limited activity within patient's tolerance;Monitored during session;Premedicated before session;Ice applied    Home Living Family/patient expects to be discharged to:: Private residence Living Arrangements: Spouse/significant other;Children;Other relatives Available Help at Discharge: Family;Available 24 hours/day Type of Home: House Home Access: Stairs to enter Entrance Stairs-Rails: Right;Left Home Layout: One level Home Equipment: None      Prior Function Level of Independence: Independent          PT Goals (current goals can now be found in the care plan section) Acute Rehab PT Goals Patient Stated Goal: play with his 89 and 48 y.o. sons PT Goal Formulation: With patient Time For Goal Achievement: 04/24/17 Potential to Achieve Goals:  Good Progress towards PT goals: Progressing toward goals    Frequency    7X/week      PT Plan Current plan remains appropriate    Co-evaluation              AM-PAC PT "6 Clicks" Daily Activity  Outcome Measure  Difficulty turning over in bed (including adjusting bedclothes, sheets and blankets)?: None Difficulty moving from lying on back  to sitting on the side of the bed? : A Lot Difficulty sitting down on and standing up from a chair with arms (e.g., wheelchair, bedside commode, etc,.)?: A Lot Help needed moving to and from a bed to chair (including a wheelchair)?: A Little Help needed walking in hospital room?: A Little Help needed climbing 3-5 steps with a railing? : A Lot 6 Click Score: 16    End of Session Equipment Utilized During Treatment: Gait belt Activity Tolerance: Patient tolerated treatment well Patient left: with call bell/phone within reach;in chair;with chair alarm set Nurse Communication: Mobility status PT Visit Diagnosis: Unsteadiness on feet (R26.81);Other abnormalities of gait and mobility (R26.89);Muscle weakness (generalized) (M62.81);Pain Pain - Right/Left: Right Pain - part of body: Knee     Time: [id number] PT Time Calculation (min) (ACUTE ONLY): 14 min  Charges:  $Gait Training: 8-22 mins $Therapeutic Exercise: 8-22 mins                    G Codes:          Ralene BatheUhlenberg, Velicia Dejager Kistler 04/18/2017, 1:28 PM [phone]

## 2017-04-18 NOTE — Discharge Instructions (Signed)
INSTRUCTIONS AFTER JOINT REPLACEMENT   o Remove items at home which could result in a fall. This includes throw rugs or furniture in walking pathways o ICE to the affected joint every three hours while awake for 30 minutes at a time, for at least the first 3-5 days, and then as needed for pain and swelling.  Continue to use ice for pain and swelling. You may notice swelling that will progress down to the foot and ankle.  This is normal after surgery.  Elevate your leg when you are not up walking on it.   o Continue to use the breathing machine you got in the hospital (incentive spirometer) which will help keep your temperature down.  It is common for your temperature to cycle up and down following surgery, especially at night when you are not up moving around and exerting yourself.  The breathing machine keeps your lungs expanded and your temperature down.   DIET:  As you were doing prior to hospitalization, we recommend a well-balanced diet.  DRESSING / WOUND CARE / SHOWERING  You may change your surgical dressing 7 days after surgery.  Then change the dressing every day with sterile gauze.  Please use good hand washing techniques before changing the dressing.  Do not use any lotions or creams on the incision until instructed by your surgeon.  You may shower while you have the surgical dressing which is waterproof.  After removal of surgical dressing, you must cover the incision when showering.  ACTIVITY  o Increase activity slowly as tolerated, but follow the weight bearing instructions below.   o No driving for 6 weeks or until further direction given by your physician.  You cannot drive while taking narcotics.  o No lifting or carrying greater than 10 lbs. until further directed by your surgeon. o Avoid periods of inactivity such as sitting longer than an hour when not asleep. This helps prevent blood clots.  o You may return to work once you are authorized by your doctor.     WEIGHT  BEARING   Weight bearing as tolerated with assist device (walker, cane, etc) as directed, use it as long as suggested by your surgeon or therapist, typically at least 4-6 weeks.   EXERCISES  Results after joint replacement surgery are often greatly improved when you follow the exercise, range of motion and muscle strengthening exercises prescribed by your doctor. Safety measures are also important to protect the joint from further injury. Any time any of these exercises cause you to have increased pain or swelling, decrease what you are doing until you are comfortable again and then slowly increase them. If you have problems or questions, call your caregiver or physical therapist for advice.   Rehabilitation is important following a joint replacement. After just a few days of immobilization, the muscles of the leg can become weakened and shrink (atrophy).  These exercises are designed to build up the tone and strength of the thigh and leg muscles and to improve motion. Often times heat used for twenty to thirty minutes before working out will loosen up your tissues and help with improving the range of motion but do not use heat for the first two weeks following surgery (sometimes heat can increase post-operative swelling).   These exercises can be done on a training (exercise) mat, on the floor, on a table or on a bed. Use whatever works the best and is most comfortable for you.    Use music or television while  you are exercising so that the exercises are a pleasant break in your day. This will make your life better with the exercises acting as a break in your routine that you can look forward to.   Perform all exercises about fifteen times, three times per day or as directed.  You should exercise both the operative leg and the other leg as well. ° °Exercises include: °  °• Quad Sets - Tighten up the muscle on the front of the thigh (Quad) and hold for 5-10 seconds.   °• Straight Leg Raises - With your  knee straight (if you were given a brace, keep it on), lift the leg to 60 degrees, hold for 3 seconds, and slowly lower the leg.  Perform this exercise against resistance later as your leg gets stronger.  °• Leg Slides: Lying on your back, slowly slide your foot toward your buttocks, bending your knee up off the floor (only go as far as is comfortable). Then slowly slide your foot back down until your leg is flat on the floor again.  °• Angel Wings: Lying on your back spread your legs to the side as far apart as you can without causing discomfort.  °• Hamstring Strength:  Lying on your back, push your heel against the floor with your leg straight by tightening up the muscles of your buttocks.  Repeat, but this time bend your knee to a comfortable angle, and push your heel against the floor.  You may put a pillow under the heel to make it more comfortable if necessary.  ° °A rehabilitation program following joint replacement surgery can speed recovery and prevent re-injury in the future due to weakened muscles. Contact your doctor or a physical therapist for more information on knee rehabilitation.  ° ° °CONSTIPATION ° °Constipation is defined medically as fewer than three stools per week and severe constipation as less than one stool per week.  Even if you have a regular bowel pattern at home, your normal regimen is likely to be disrupted due to multiple reasons following surgery.  Combination of anesthesia, postoperative narcotics, change in appetite and fluid intake all can affect your bowels.  ° °YOU MUST use at least one of the following options; they are listed in order of increasing strength to get the job done.  They are all available over the counter, and you may need to use some, POSSIBLY even all of these options:   ° °Drink plenty of fluids (prune juice may be helpful) and high fiber foods °Colace 100 mg by mouth twice a day  °Senokot for constipation as directed and as needed Dulcolax (bisacodyl), take  with full glass of water  °Miralax (polyethylene glycol) once or twice a day as needed. ° °If you have tried all these things and are unable to have a bowel movement in the first 3-4 days after surgery call either your surgeon or your primary doctor.   ° °If you experience loose stools or diarrhea, hold the medications until you stool forms back up.  If your symptoms do not get better within 1 week or if they get worse, check with your doctor.  If you experience "the worst abdominal pain ever" or develop nausea or vomiting, please contact the office immediately for further recommendations for treatment. ° ° °ITCHING:  If you experience itching with your medications, try taking only a single pain pill, or even half a pain pill at a time.  You can also use Benadryl over the counter   for itching or also to help with sleep.   TED HOSE STOCKINGS:  Use stockings on both legs until for at least 2 weeks or as directed by physician office. They may be removed at night for sleeping.  MEDICATIONS:  See your medication summary on the After Visit Summary that nursing will review with you.  You may have some home medications which will be placed on hold until you complete the course of blood thinner medication.  It is important for you to complete the blood thinner medication as prescribed.  PRECAUTIONS:  If you experience chest pain or shortness of breath - call 911 immediately for transfer to the hospital emergency department.   If you develop a fever greater that 101 F, purulent drainage from wound, increased redness or drainage from wound, foul odor from the wound/dressing, or calf pain - CONTACT YOUR SURGEON.                                                   FOLLOW-UP APPOINTMENTS:  If you do not already have a post-op appointment, please call the office for an appointment to be seen by your surgeon.  Guidelines for how soon to be seen are listed in your After Visit Summary, but are typically between 1-4 weeks  after surgery.  OTHER INSTRUCTIONS:   Knee Replacement:  Do not place pillow under knee, focus on keeping the knee straight while resting. CPM instructions: 0-90 degrees, 2 hours in the morning, 2 hours in the afternoon, and 2 hours in the evening. Place foam block, curve side up under heel at all times except when in CPM or when walking.  DO NOT modify, tear, cut, or change the foam block in any way.  MAKE SURE YOU:   Understand these instructions.   Get help right away if you are not doing well or get worse.    Thank you for letting us be a part of your medical care team.  It is a privilege we respect greatly.  We hope these instructions will help you stay on track for a fast and full recovery!    Information on my medicine - XARELTO (rivaroxaban)  This medication education was reviewed with me or my healthcare representative as part of my discharge preparation.  The pharmacist that spoke with me during my hospital stay was:  Lennon AlstromDang, Joanell Cressler Dien, [facility]  WHY WAS David HurlXARELTO PRESCRIBED FOR YOU? Xarelto was prescribed to treat blood clots that may have been found in the veins of your legs (deep vein thrombosis) or in your lungs (pulmonary embolism) and to reduce the risk of them occurring again.  What do you need to know about Xarelto? The dose is one 20 mg tablet taken ONCE A DAY with your evening meal.  DO NOT stop taking Xarelto without talking to the health care provider who prescribed the medication.  Refill your prescription for 20 mg tablets before you run out.  After discharge, you should have regular check-up appointments with your healthcare provider that is prescribing your Xarelto.  In the future your dose may need to be changed if your kidney function changes by a significant amount.  What do you do if you miss a dose? If you are taking Xarelto TWICE DAILY and you miss a dose, take it as soon as you remember. You may take two 15  mg tablets (total 30 mg) at the same time then  resume your regularly scheduled 15 mg twice daily the next day.  If you are taking Xarelto ONCE DAILY and you miss a dose, take it as soon as you remember on the same day then continue your regularly scheduled once daily regimen the next day. Do not take two doses of Xarelto at the same time.   Important Safety Information Xarelto is a blood thinner medicine that can cause bleeding. You should call your healthcare provider right away if you experience any of the following: ? Bleeding from an injury or your nose that does not stop. ? Unusual colored urine (red or dark brown) or unusual colored stools (red or black). ? Unusual bruising for unknown reasons. ? A serious fall or if you hit your head (even if there is no bleeding).  Some medicines may interact with Xarelto and might increase your risk of bleeding while on Xarelto. To help avoid this, consult your healthcare provider or pharmacist prior to using any new prescription or non-prescription medications, including herbals, vitamins, non-steroidal anti-inflammatory drugs (NSAIDs) and supplements.  This website has more information on Xarelto: [email or web address].

## 2017-04-19 LAB — GLUCOSE, CAPILLARY
Glucose-Capillary: 209 mg/dL — ABNORMAL HIGH (ref 65–99)
Glucose-Capillary: 200 mg/dL — ABNORMAL HIGH (ref 65–99)

## 2017-04-19 NOTE — Progress Notes (Signed)
Occupational Therapy Treatment Patient Details Name: [name] Kiker MRN: [id number] DOB: 21-Nov-1970 Today's Date: 04/19/2017    History of present illness Patient is a 46 yo male admitted 04/17/17 now s/p Rt TKA.   PMH:  OA, depression, DM, HTN   OT comments  Pt making progress with functional goals. OT will continue to follow acutely  Follow Up Recommendations  DC plan and follow up therapy as arranged by surgeon;Supervision/Assistance - 24 hour    Equipment Recommendations  3 in 1 bedside commode    Recommendations for Other Services      Precautions / Restrictions Precautions Precautions: Knee Precaution Comments: Reviewed knee precautions with patient  Restrictions Weight Bearing Restrictions: Yes RLE Weight Bearing: Weight bearing as tolerated       Mobility Bed Mobility               General bed mobility comments: up in recliner  Transfers Overall transfer level: Needs assistance Equipment used: Rolling walker (2 wheeled) Transfers: Sit to/from Stand Sit to Stand: Min assist;Min guard         General transfer comment: Verbal cues for correct hand/LE placement, min guard A from toilet seat    Balance Overall balance assessment: Needs assistance Sitting-balance support: Feet supported;Single extremity supported Sitting balance-Leahy Scale: Good     Standing balance support: Bilateral upper extremity supported;During functional activity Standing balance-Leahy Scale: Fair                             ADL either performed or assessed with clinical judgement   ADL Overall ADL's : Needs assistance/impaired     Grooming: Min guard;Standing;Wash/dry hands;Wash/dry face       Lower Body Bathing: Moderate assistance;Sit to/from stand;With caregiver independent assisting           Toilet Transfer: Minimal assistance;Ambulation;RW;Min guard;Comfort height toilet;Grab bars;Cueing for safety;With caregiver independent assisting    Toileting- Clothing Manipulation and Hygiene: Sit to/from stand;Min guard;With caregiver independent assisting   Tub/ Shower Transfer: 3 in 1;Moderate assistance;With caregiver independent assisting Tub/Shower Transfer Details (indicate cue type and reason): instructed on use of 3 in 1 Functional mobility during ADLs: Minimal assistance;Rolling walker;Min guard;Caregiver able to provide necessary level of assistance;Cueing for safety General ADL Comments: educated pt and family on ise of 3 in 1 for tub shower transfers, practiced with mod A (encouraged pt/family to have HH therapy to practice with them at first visit)     Vision Patient Visual Report: No change from baseline     Perception     Praxis      Cognition Arousal/Alertness: Awake/alert Behavior During Therapy: WFL for tasks assessed/performed Overall Cognitive Status: Within Functional Limits for tasks assessed                                                      General Comments  pt pleasant and cooperative    Pertinent Vitals/ Pain       Pain Assessment: 0-10 Pain Score: 7  Pain Location: R knee Pain Descriptors / Indicators: Aching;Sore Pain Intervention(s): Limited activity within patient's tolerance;Monitored during session;Ice applied;Premedicated before session;Repositioned  Home Living  Prior Functioning/Environment              Frequency  Min 2X/week        Progress Toward Goals  OT Goals(current goals can now be found in the care plan section)  Progress towards OT goals: Progressing toward goals     Plan Discharge plan remains appropriate    Co-evaluation                 AM-PAC PT "6 Clicks" Daily Activity     Outcome Measure   Help from another person eating meals?: None Help from another person taking care of personal grooming?: A Little Help from another person toileting, which includes using  toliet, bedpan, or urinal?: A Little Help from another person bathing (including washing, rinsing, drying)?: A Little Help from another person to put on and taking off regular upper body clothing?: None Help from another person to put on and taking off regular lower body clothing?: A Lot 6 Click Score: 19    End of Session Equipment Utilized During Treatment: Gait belt;Rolling walker;Other (comment) (3 in 1)  OT Visit Diagnosis: Muscle weakness (generalized) (M62.81);Unsteadiness on feet (R26.81)   Activity Tolerance Patient tolerated treatment well   Patient Left in chair;with call bell/phone within reach;with chair alarm set   Nurse Communication      Functional Assessment Tool Used: AM-PAC 6 Clicks Daily Activity   Time: [id number] OT Time Calculation (min): 18 min  Charges: OT G-codes **NOT FOR INPATIENT CLASS** Functional Assessment Tool Used: AM-PAC 6 Clicks Daily Activity OT General Charges $OT Visit: 1 Procedure OT Treatments $Self Care/Home Management : 8-22 mins     Galen ManilaSpencer, Ladarien Beeks Jeanette 04/19/2017, 12:11 PM

## 2017-04-19 NOTE — Care Management (Signed)
Case manager explained to patient that he has to disclose financial information to [facility] to receive charity services and DME. Patient says he will comply.

## 2017-04-19 NOTE — Progress Notes (Signed)
Physical Therapy Treatment Patient Details Name: David Dunlap MRN: [id number] DOB: 1970/09/19 Today's Date: 04/19/2017    History of Present Illness Patient is a 46 yo male admitted 04/17/17 now s/p Rt TKA.   PMH:  OA, depression, DM, HTN    PT Comments    Pt tolerated Tx well with no limitations. Pt required supervision for safety with transfers and gait training with minimal cues. Pt was min assist with stair training, pt required cues for initial few steps. PTA demonstrated backwards stair training with RW to pt and girlfriend prior to them performing technique. They both feel comfortable with stair technique.    Follow Up Recommendations  Home health PT;Supervision for mobility/OOB     Equipment Recommendations  Rolling walker with 5" wheels;3in1 (PT)    Recommendations for Other Services       Precautions / Restrictions Precautions Precautions: Knee Precaution Comments: Reviewed knee precautions with patient  Restrictions Weight Bearing Restrictions: Yes RLE Weight Bearing: Weight bearing as tolerated    Mobility  Bed Mobility               General bed mobility comments: Pt sitting up in recliner upon arrival  Transfers Overall transfer level: Modified independent Equipment used: Rolling walker (2 wheeled) Transfers: Sit to/from Stand Sit to Stand: Supervision         General transfer comment: No cues needed for transfers, supervision for safety  Ambulation/Gait Ambulation/Gait assistance: Supervision;Min guard Ambulation Distance (Feet): 125 Feet Assistive device: Rolling walker (2 wheeled) Gait Pattern/deviations: Step-through pattern;Decreased dorsiflexion - right;Decreased weight shift to right Gait velocity: normal    General Gait Details: Supervision for safety, good technique with RW. VC's for heel strike/toe off, practiced standing weightshift to RLE   Stairs Stairs: Yes   Stair Management: No rails;Backwards;With walker Number of  Stairs: 5 General stair comments: Pt ascended/decended 5 stairs, PTA demonstrated then pt performed, mimimal cues, Pt girlfriend assisted with stair training after PTA demonstrated her role. Pt and girlfriend both say they feel comfortable with technique.  Wheelchair Mobility    Modified Rankin (Stroke Patients Only)       Balance Overall balance assessment: Modified Independent Sitting-balance support: Feet supported;No upper extremity supported Sitting balance-Leahy Scale: Good Sitting balance - Comments: No UE required in sitting   Standing balance support: Bilateral upper extremity supported;During functional activity Standing balance-Leahy Scale: Fair Standing balance comment: No UE needed during static standing, BUE required during activity                            Cognition Arousal/Alertness: Awake/alert Behavior During Therapy: WFL for tasks assessed/performed Overall Cognitive Status: Within Functional Limits for tasks assessed                                        Exercises Total Joint Exercises Ankle Circles/Pumps: AROM;Both;10 reps;Supine Quad Sets: AROM;Right;10 reps;Supine Towel Squeeze: AROM;Supine;Right;10 reps Short Arc Quad: Right;10 reps;Supine;AROM Heel Slides: AAROM;Right;Supine;5 reps Hip ABduction/ADduction: AROM;Right;10 reps;Supine    General Comments        Pertinent Vitals/Pain Pain Assessment: No/denies pain Pain Score: 7  Pain Location: R knee Pain Descriptors / Indicators: Aching;Sore Pain Intervention(s): Limited activity within patient's tolerance;Monitored during session;Ice applied;Premedicated before session;Repositioned    Home Living  Prior Function            PT Goals (current goals can now be found in the care plan section) Acute Rehab PT Goals Patient Stated Goal: To go home today PT Goal Formulation: With patient Potential to Achieve Goals: Good Progress  towards PT goals: Progressing toward goals    Frequency    7X/week      PT Plan Current plan remains appropriate    Co-evaluation              AM-PAC PT "6 Clicks" Daily Activity  Outcome Measure  Difficulty turning over in bed (including adjusting bedclothes, sheets and blankets)?: None Difficulty moving from lying on back to sitting on the side of the bed? : A Little Difficulty sitting down on and standing up from a chair with arms (e.g., wheelchair, bedside commode, etc,.)?: A Little Help needed moving to and from a bed to chair (including a wheelchair)?: None Help needed walking in hospital room?: None Help needed climbing 3-5 steps with a railing? : A Little 6 Click Score: 21    End of Session Equipment Utilized During Treatment: Gait belt Activity Tolerance: Patient tolerated treatment well Patient left: with call bell/phone within reach;in chair;with family/visitor present Nurse Communication: Mobility status PT Visit Diagnosis: Unsteadiness on feet (R26.81);Other abnormalities of gait and mobility (R26.89);Muscle weakness (generalized) (M62.81);Pain Pain - Right/Left: Right Pain - part of body: Knee     Time: 1130-1207 PT Time Calculation (min) (ACUTE ONLY): 37 min  Charges:  $Gait Training: 8-22 mins $Therapeutic Exercise: 8-22 mins                    G Codes:       David Dunlap, SPTA [phone]    David Dunlap 04/19/2017, 2:05 PM

## 2017-04-19 NOTE — Discharge Summary (Signed)
Patient ID: David Dunlap MRN: [id number] DOB/AGE: January 07, 1971 46 y.o.  Admit date: 04/17/2017 Discharge date: 04/19/2017  Admission Diagnoses:  Active Problems:   Total knee replacement status   Discharge Diagnoses:  Same  Past Medical History:  Diagnosis Date  . Arthritis   . Depression   . Diabetes mellitus without complication (HCC) 05/2016   NEW ONSET    WITH FAMILY HISTORY  . DVT (deep venous thrombosis) (HCC)   . Essential hypertension   . Gallstones 05/2016  . GERD (gastroesophageal reflux disease)     Surgeries: Procedure(s): RIGHT TOTAL KNEE ARTHROPLASTY on 04/17/2017   Consultants:   Discharged Condition: Improved  Hospital Course: David Dunlap is an 46 y.o. male who was admitted 04/17/2017 for operative treatment of<principal problem not specified>. Patient has severe unremitting pain that affects sleep, daily activities, and work/hobbies. After pre-op clearance the patient was taken to the operating room on 04/17/2017 and underwent  Procedure(s): RIGHT TOTAL KNEE ARTHROPLASTY.    Patient was given perioperative antibiotics: Anti-infectives    Start     Dose/Rate Route Frequency Ordered Stop   04/17/17 2200  sulfamethoxazole-trimethoprim (BACTRIM DS,SEPTRA DS) 800-160 MG per tablet 1 tablet     1 tablet Oral Every 12 hours 04/17/17 1704     04/17/17 2200  ceFAZolin (ANCEF) 3 g in dextrose 5 % 50 mL IVPB     3 g 130 mL/hr over 30 Minutes Intravenous Every 8 hours 04/17/17 1806 04/18/17 0806   04/17/17 1715  ceFAZolin (ANCEF) 3 g in dextrose 5 % 50 mL IVPB  Status:  Discontinued     3 g 130 mL/hr over 30 Minutes Intravenous Every 8 hours 04/17/17 1704 04/17/17 1806   04/17/17 1000  ceFAZolin (ANCEF) 3 g in dextrose 5 % 50 mL IVPB     3 g 130 mL/hr over 30 Minutes Intravenous To ShortStay Surgical 04/16/17 0954 04/17/17 1322   04/17/17 0000  sulfamethoxazole-trimethoprim (BACTRIM DS,SEPTRA DS) 800-160 MG tablet     1 tablet Oral 2 times daily 04/17/17 1509          Patient was given sequential compression devices, early ambulation, and chemoprophylaxis to prevent DVT.  Patient benefited maximally from hospital stay and there were no complications.    Recent vital signs: Patient Vitals for the past 24 hrs:  BP Temp Temp src Pulse Resp SpO2  04/19/17 0600 105/84 98.4 F (36.9 C) Oral 74 18 93 %  04/18/17 2000 110/65 98.4 F (36.9 C) Oral 87 16 98 %  04/18/17 1447 118/62 98.9 F (37.2 C) Other 92 16 97 %     Recent laboratory studies:  Recent Labs  04/18/17 0351  WBC 14.2*  HGB 12.4*  HCT 36.1*  PLT 160  NA 135  K 4.8  CL 103  CO2 26  BUN 17  CREATININE 1.17  GLUCOSE 195*  CALCIUM 8.5*     Discharge Medications:   Allergies as of 04/19/2017   No Known Allergies     Medication List    TAKE these medications   acetaminophen-codeine 300-30 MG tablet Commonly known as:  TYLENOL #3 Take 1 tablet by mouth every 12 (twelve) hours as needed for moderate pain.   aspirin EC 81 MG tablet Take 81 mg by mouth daily.   cetirizine 10 MG tablet Commonly known as:  ZYRTEC Take 1 tablet (10 mg total) by mouth daily.   famotidine 20 MG tablet Commonly known as:  PEPCID Take 1 tablet (20 mg total) by  mouth daily as needed for heartburn.   FLUoxetine 20 MG tablet Commonly known as:  PROZAC Take 2 tablets (40 mg total) by mouth daily.   gabapentin 300 MG capsule Commonly known as:  NEURONTIN Take 1 capsule (300 mg total) by mouth 3 (three) times daily.   glipiZIDE 5 MG tablet Commonly known as:  GLUCOTROL Take 1 tablet (5 mg total) by mouth 2 (two) times daily before a meal.   glucose blood test strip Commonly known as:  TRUE METRIX BLOOD GLUCOSE TEST Used daily before meals   hydrochlorothiazide 25 MG tablet Commonly known as:  HYDRODIURIL Take 1 tablet (25 mg total) by mouth daily.   lisinopril 10 MG tablet Commonly known as:  PRINIVIL,ZESTRIL Take 1 tablet (10 mg total) by mouth daily.   metFORMIN 500 MG  tablet Commonly known as:  GLUCOPHAGE Take 2 tablets (1,000 mg total) by mouth 2 (two) times daily with a meal.   methocarbamol 750 MG tablet Commonly known as:  ROBAXIN Take 1 tablet (750 mg total) by mouth 2 (two) times daily as needed for muscle spasms.   ondansetron 4 MG tablet Commonly known as:  ZOFRAN Take 1-2 tablets (4-8 mg total) by mouth every 8 (eight) hours as needed for nausea or vomiting.   oxyCODONE 5 MG immediate release tablet Commonly known as:  Oxy IR/ROXICODONE Take 1-3 tablets (5-15 mg total) by mouth every 4 (four) hours as needed.   oxyCODONE 10 mg 12 hr tablet Commonly known as:  OXYCONTIN Take 1 tablet (10 mg total) by mouth every 12 (twelve) hours.   rivaroxaban 20 MG Tabs tablet Commonly known as:  XARELTO Take 1 tablet (20 mg total) by mouth daily with supper.   senna-docusate 8.6-50 MG tablet Commonly known as:  SENOKOT S Take 1 tablet by mouth at bedtime as needed.   sulfamethoxazole-trimethoprim 800-160 MG tablet Commonly known as:  BACTRIM DS,SEPTRA DS Take 1 tablet by mouth 2 (two) times daily.   TRUE METRIX METER Devi 1 each by Does not apply route 3 (three) times daily before meals.   TRUEPLUS LANCETS 28G Misc Use daily before meals            Durable Medical Equipment        Start     Ordered   04/17/17 1705  DME Walker rolling  Once    Question:  Patient needs a walker to treat with the following condition  Answer:  Total knee replacement status   04/17/17 1704   04/17/17 1705  DME 3 n 1  Once     04/17/17 1704   04/17/17 1705  DME Bedside commode  Once    Question:  Patient needs a bedside commode to treat with the following condition  Answer:  Total knee replacement status   04/17/17 1704      Diagnostic Studies: Dg Knee Right Port  Result Date: 04/17/2017 CLINICAL DATA:  Osteoarthritis. Status post right total knee replacement. EXAM: PORTABLE RIGHT KNEE - 1-2 VIEW COMPARISON:  10/16/2016 FINDINGS: The components of  the total knee prosthesis appear in good position. No fractures. Expected postsurgical air. IMPRESSION: Satisfactory appearance of the right knee after total knee replacement. Electronically Signed   By: Francene BoyersJames  Maxwell M.D.   On: 04/17/2017 16:23    Disposition: 01-Home or Self Care  Discharge Instructions    Discharge patient    Complete by:  As directed    Discharge disposition:  01-Home or Self Care   Discharge patient date:  04/19/2017      Follow-up Information    Tarry Kos, MD Follow up in 2 week(s).   Specialty:  Orthopedic Surgery Why:  For suture removal, For wound re-check Contact information: [address] [place] [address] [phone]        [facility] Follow up.   Why:  A representative from [facility] will contact you to arrange start date and time for your therapy. Contact information: [address] [place] [address] [phone]            Signed: Kathryne Hitch 04/19/2017, 7:00 AM

## 2017-04-19 NOTE — Progress Notes (Signed)
Patient ID: David Dunlap, male   DOB: 10-Jan-1971, 46 y.o.   MRN: [id number] Right knee stable.  Vitals stable.  Calf soft. Dressing clean and dry.  Can be discharged to home today.

## 2017-04-22 ENCOUNTER — Encounter (HOSPITAL_COMMUNITY): Payer: Self-pay | Admitting: Orthopaedic Surgery

## 2017-04-22 ENCOUNTER — Other Ambulatory Visit (INDEPENDENT_AMBULATORY_CARE_PROVIDER_SITE_OTHER): Payer: Self-pay | Admitting: Orthopaedic Surgery

## 2017-04-22 MED FILL — oxyCODONE HCL 5 MG TABS: 5 | 5 days supply | Qty: 90 | Fill #0

## 2017-05-03 ENCOUNTER — Ambulatory Visit (INDEPENDENT_AMBULATORY_CARE_PROVIDER_SITE_OTHER): Payer: Self-pay | Admitting: Orthopaedic Surgery

## 2017-05-03 ENCOUNTER — Ambulatory Visit (INDEPENDENT_AMBULATORY_CARE_PROVIDER_SITE_OTHER): Payer: Self-pay

## 2017-05-03 DIAGNOSIS — M1711 Unilateral primary osteoarthritis, right knee: Secondary | ICD-10-CM

## 2017-05-03 MED ORDER — OXYCODONE-ACETAMINOPHEN 5-325 MG PO TABS: 1.0000 | ORAL_TABLET | Freq: Two times a day (BID) | ORAL | 0 refills | Status: DC | PRN

## 2017-05-03 NOTE — Progress Notes (Signed)
Patient is 2 weeks status post right total knee replacement. He has moderate pain which he takes oxycodone for. He is doing home health physical therapy 3 times a week. His incision is clean dry and intact without any signs of infection. He has no calf tenderness. His range of motion is 0-90. He does have expected postoperative swelling. His x-ray show stable alignment of the knee replacement. He is ambulating with a walker. Percocet was refilled today. Outpatient physical therapy referral was made. Follow-up in 4 weeks with repeat 2 view x-rays of the right knee

## 2017-05-14 ENCOUNTER — Encounter: Payer: Self-pay | Admitting: Physical Therapy

## 2017-05-14 ENCOUNTER — Ambulatory Visit: Payer: No Typology Code available for payment source | Attending: Orthopaedic Surgery | Admitting: Physical Therapy

## 2017-05-14 DIAGNOSIS — M25561 Pain in right knee: Secondary | ICD-10-CM | POA: Insufficient documentation

## 2017-05-14 DIAGNOSIS — R600 Localized edema: Secondary | ICD-10-CM

## 2017-05-14 DIAGNOSIS — M6281 Muscle weakness (generalized): Secondary | ICD-10-CM

## 2017-05-14 DIAGNOSIS — M25661 Stiffness of right knee, not elsewhere classified: Secondary | ICD-10-CM

## 2017-05-14 DIAGNOSIS — R262 Difficulty in walking, not elsewhere classified: Secondary | ICD-10-CM

## 2017-05-14 NOTE — Therapy (Signed)
[facility] [address] [place], [place], [address] Phone: [phone]   Fax:  [phone]  Physical Therapy Treatment  Patient Details  Name: David Dunlap MRN: [id number] Date of Birth: 12-10-1970 Referring Provider: Dr. Gershon Mussel   Encounter Date: 05/14/2017      PT End of Session - 05/14/17 1252    Visit Number 1   Number of Visits 16   Date for PT Re-Evaluation 07/09/17   PT Start Time 1102   PT Stop Time 1150   PT Time Calculation (min) 48 min   Activity Tolerance Patient tolerated treatment well   Behavior During Therapy [facility] for tasks assessed/performed      Past Medical History:  Diagnosis Date  . Arthritis   . Depression   . Diabetes mellitus without complication (HCC) 05/2016   NEW ONSET    WITH FAMILY HISTORY  . DVT (deep venous thrombosis) (HCC)   . Essential hypertension   . Gallstones 05/2016  . GERD (gastroesophageal reflux disease)     Past Surgical History:  Procedure Laterality Date  . CHOLECYSTECTOMY N/A 06/07/2016   Procedure: LAPAROSCOPIC CHOLECYSTECTOMY WITH ATTEMPTED INTRAOPERATIVE CHOLANGIOGRAM;  Surgeon: Violeta Gelinas, MD;  Location: [facility] OR;  Service: General;  Laterality: N/A;  . LIGAMENT REPAIR     R forearm  . TOTAL KNEE ARTHROPLASTY Right 04/17/2017   Procedure: RIGHT TOTAL KNEE ARTHROPLASTY;  Surgeon: Tarry Kos, MD;  Location: [facility] OR;  Service: Orthopedics;  Laterality: Right;    There were no vitals filed for this visit.      Subjective Assessment - 05/14/17 1109    Subjective Pt underwent Rt. TKA on 04/17/17 without complications.  He is out of pain medication.  He is limited in mobility and out of the home.  Standing is very limited.  He had HHPT for 4 weeks.  He    Limitations Sitting;Lifting;Standing;Walking;House hold activities   How long can you sit comfortably? 30 min    How long can you stand comfortably? 10-15 min    How long can you walk comfortably? could do 30  min moves slowly, uses cane , walks up and down his street everyday    Patient Stated Goals Pt would be able to have less pain, walk without cane.    Currently in Pain? Yes   Pain Score 9    Pain Location Knee   Pain Orientation Right;Anterior   Pain Descriptors / Indicators Aching   Pain Type Surgical pain   Pain Onset More than a month ago   Pain Frequency Constant   Aggravating Factors  bending it , overdoing it    Pain Relieving Factors ice, out of meds, propping it            [facility] PT Assessment - 05/14/17 0001      Assessment   Medical Diagnosis Rt. TKA    Referring Provider Dr. Gershon Mussel    Onset Date/Surgical Date 04/17/17   Next MD Visit 3 weeks    Prior Therapy Yes HHPT      Precautions   Precautions None     Restrictions   Weight Bearing Restrictions No     Balance Screen   Has the patient fallen in the past 6 months No  has had a couple near falls    Has the patient had a decrease in activity level because of a fear of falling?  Yes   Is the patient reluctant to leave their home because of  a fear of falling?  No     Home Nurse, mental health Private residence   Living Arrangements Spouse/significant other;Children   Type of Home House   Home Access Stairs to enter   Entrance Stairs-Number of Steps 4   Entrance Stairs-Rails Right   Home Layout One level   Home Equipment Walker - 2 wheels;Cane - single point;Bedside commode     Prior Function   Level of Independence Independent with basic ADLs;Independent with gait;Independent with transfers   Vocation Unemployed   Leisure sports, was a Psychologist, occupational for [employer], being outside      [employer]   Overall Cognitive Status Within Functional Limits for tasks assessed     Observation/Other Assessments-Edema    Edema Circumferential     Circumferential Edema   Circumferential - Right 18 inch   17 calf    Circumferential - Left  20 inch   17 1/2 inch     Sensation   Light Touch Impaired by gross  assessment   Additional Comments min numbness lateral due to incision      AROM   Right Knee Extension -15   Right Knee Flexion 101   Left Knee Extension 0   Left Knee Flexion 128     Strength   Right Hip Flexion 4-/5   Left Hip Flexion 5/5   Right Knee Flexion 4/5   Right Knee Extension 4-/5   Left Knee Flexion 4+/5   Left Knee Extension 4+/5     Palpation   Patella mobility hypomobile, tight skin    Palpation comment tender along well healed incison      Ambulation/Gait   Gait Pattern Decreased weight shift to right;Right flexed knee in stance;Antalgic;Wide base of support   Gait velocity slower than community                      [facility] Adult PT Treatment/Exercise - 05/14/17 0001      Self-Care   Self-Care Heat/Ice Application;Other Self-Care Comments   Heat/Ice Application avoid heat with swelling   Other Self-Care Comments  HEP, swelling     Vasopneumatic   Number Minutes Vasopneumatic  15 minutes   Vasopnuematic Location  Knee   Vasopneumatic Pressure Low   Vasopneumatic Temperature  32 deg                   PT Short Term Goals - 05/14/17 1310      PT SHORT TERM GOAL #1   Title Pt will be I with initial HEP for knee AROM and strength.    Time 4   Period Weeks   Status New   Target Date 06/11/17     PT SHORT TERM GOAL #2   Title Pt will be able to extend leg on mat to -8 deg for ROM and manual work.     Time 4   Period Weeks   Status New   Target Date 06/11/17     PT SHORT TERM GOAL #3   Title Pt will be able to stand to cook for 15 min with only min pain increase from baseline.    Time 4   Period Weeks   Status New   Target Date 06/11/17           PT Long Term Goals - 05/14/17 1312      PT LONG TERM GOAL #1   Title Pt will be I with more advanced HEP and concepts of RICE  to control swelling.    Time 8   Period Weeks   Status New   Target Date 07/09/17     PT LONG TERM GOAL #2   Title Pt will increase strength  in bilateral LE's to 4+/5 throughout knees and hips for improved functional mobility.    Time 8   Period Weeks   Status New   Target Date 07/09/17     PT LONG TERM GOAL #3   Title Pt will be able to go up and down 12 stairs with 1 rail reciprocally and safety, min pain.    Time 8   Period Weeks   Status New   Target Date 07/09/17     PT LONG TERM GOAL #4   Title Pt with be able to stand and walking in the community for up to 45 min with only min pain in Rt. knee    Time 8   Period Weeks   Status New   Target Date 07/09/17               Plan - 05/14/17 1253    Clinical Impression Statement Patient presents for low complexity eval of Rt. TKR on 04/17/17.  He has significant swelling in Rt. lower leg, he states it has been this way since his surgery. Pain is not well controlled, difficulty extending knee on the mat due to anterior knee pain.  Pt urged to call his MD anf see if he can have a refill on his pain meds.  AROM 0-12-101 deg.  Strength inhibited due to pain, poor quad contraction.    Clinical Presentation Stable   Clinical Decision Making Low   Rehab Potential Excellent   PT Frequency 2x / week   PT Duration 8 weeks   PT Treatment/Interventions ADLs/Self Care Home Management;Electrical Stimulation;Cryotherapy;Functional mobility training;Balance training;Therapeutic exercise;Therapeutic activities;DME Instruction;Stair training;Gait training;Ultrasound;Manual techniques;Passive range of motion;Taping;Vasopneumatic Device;Neuromuscular re-education;Patient/family education;Manual lymph drainage   PT Next Visit Plan manage swelling, check HEP, manual if tolerated    PT Home Exercise Plan quad set supine and standing, SLR, LAQ   Consulted and Agree with Plan of Care Patient      Patient will benefit from skilled therapeutic intervention in order to improve the following deficits and impairments:  Decreased activity tolerance, Decreased strength, Increased fascial  restricitons, Impaired flexibility, Impaired UE functional use, Pain, Postural dysfunction, Obesity, Increased edema, Decreased range of motion, Decreased balance, Decreased endurance, Decreased mobility, Difficulty walking, Impaired sensation, Hypomobility  Visit Diagnosis: Localized edema  Muscle weakness (generalized)  Stiffness of right knee, not elsewhere classified  Acute pain of right knee  Difficulty in walking, not elsewhere classified     Problem List Patient Active Problem List   Diagnosis Date Noted  . Total knee replacement status 04/17/2017  . GERD (gastroesophageal reflux disease) 11/27/2016  . Unilateral primary osteoarthritis, right knee 10/18/2016  . Neuropathy 07/25/2016  . Acute cholecystitis 06/05/2016  . Diabetes mellitus without complication (HCC) 05/18/2016  . Cholelithiasis 04/09/2016  . Major depressive disorder, single episode 04/09/2016  . Leg DVT (deep venous thromboembolism), chronic (HCC) 12/08/2014  . Family history of diabetes mellitus (DM) 12/08/2014  . DVT, femoral, acute (HCC) 03/22/2014  . Moderate major depression, single episode (HCC) 08/22/2011  . Constipation 11/22/2010  . ANKLE EDEMA 10/19/2010  . Essential hypertension 10/12/2010    Karstyn Birkey 05/14/2017, 1:21 PM  [facility] [address] [place], [place], [address] Phone: [phone]   Fax:  [phone]  Name: Jeancarlo  DIANTE SAXON MRN: [id number] Date of Birth: 1971-05-05  Karie Mainland, PT 05/14/17 1:21 PM Phone: [phone] Fax: [phone]

## 2017-05-14 NOTE — Patient Instructions (Signed)
Closed Chain Knee Extension    With ball against wall (use a pillow or small ball ) stand with back of mid-thigh against ball, foot partially under ball, knee flexed. Other foot is slightly in front of working leg. Attempt to straighten knee and hold __10_ seconds with right leg; Do __2_ sets of _10__ repetitions.  Copyright  VHI. All rights reserved.    (Home) Knee: Extension - Sitting    Anchor behind, sit with knee over edge of chair. Lift leg, straighten knee. Repeat __20__ times per set. Do __2 sets per session. Do __2-3__ sessions per week. Use ____ lb weights.  Copyright  VHI. All rights reserved.    KNEE: Extension (Isometric)    Lie on back, legs straight. Tighten quadriceps by pushing back of knee into surface. Hold __10_ seconds. __10_ reps per set, _2-3__ sets per day, __7_ days per week   Copyright  VHI. All rights reserved.    HIP: Flexion / KNEE: Extension, Straight Leg Raise    Raise leg, keeping knee straight. Perform slowly. __10_ reps per set, __2_ sets per day, __7_ days per week   Copyright  VHI. All rights reserved.    The patient is advised to apply ice or cold packs intermittently as needed to relieve pain.

## 2017-05-22 ENCOUNTER — Ambulatory Visit: Payer: No Typology Code available for payment source | Attending: Orthopaedic Surgery

## 2017-05-22 DIAGNOSIS — R600 Localized edema: Secondary | ICD-10-CM

## 2017-05-22 DIAGNOSIS — M25661 Stiffness of right knee, not elsewhere classified: Secondary | ICD-10-CM

## 2017-05-22 DIAGNOSIS — R262 Difficulty in walking, not elsewhere classified: Secondary | ICD-10-CM | POA: Insufficient documentation

## 2017-05-22 DIAGNOSIS — M25561 Pain in right knee: Secondary | ICD-10-CM | POA: Insufficient documentation

## 2017-05-22 DIAGNOSIS — M6281 Muscle weakness (generalized): Secondary | ICD-10-CM | POA: Insufficient documentation

## 2017-05-22 NOTE — Therapy (Signed)
[facility] Outpatient Rehabilitation [facility] [address] [place], [place], [address] Phone: [phone]   Fax:  [phone]  Physical Therapy Treatment  Patient Details  Name: David Dunlap MRN: [id number] Date of Birth: 1971/07/17 Referring Provider: Dr. Gershon Mussel   Encounter Date: 05/22/2017      PT End of Session - 05/22/17 0947    Visit Number 2   Number of Visits 16   Date for PT Re-Evaluation 07/09/17   PT Start Time 0933   PT Stop Time 1028   PT Time Calculation (min) 55 min   Activity Tolerance Patient tolerated treatment well   Behavior During Therapy [facility] for tasks assessed/performed      Past Medical History:  Diagnosis Date  . Arthritis   . Depression   . Diabetes mellitus without complication (HCC) 05/2016   NEW ONSET    WITH FAMILY HISTORY  . DVT (deep venous thrombosis) (HCC)   . Essential hypertension   . Gallstones 05/2016  . GERD (gastroesophageal reflux disease)     Past Surgical History:  Procedure Laterality Date  . CHOLECYSTECTOMY N/A 06/07/2016   Procedure: LAPAROSCOPIC CHOLECYSTECTOMY WITH ATTEMPTED INTRAOPERATIVE CHOLANGIOGRAM;  Surgeon: Violeta Gelinas, MD;  Location: [facility] OR;  Service: General;  Laterality: N/A;  . LIGAMENT REPAIR     R forearm  . TOTAL KNEE ARTHROPLASTY Right 04/17/2017   Procedure: RIGHT TOTAL KNEE ARTHROPLASTY;  Surgeon: Tarry Kos, MD;  Location: [facility] OR;  Service: Orthopedics;  Laterality: Right;    There were no vitals filed for this visit.      Subjective Assessment - 05/22/17 0936    Subjective Pt. noting knee hurting him most at night.  Pain levels have improved over this past week.     Patient Stated Goals Pt would be able to have less pain, walk without cane.    Currently in Pain? Yes   Pain Score 4    Pain Location Knee   Pain Orientation Right;Anterior   Pain Descriptors / Indicators Aching   Pain Type Surgical pain   Pain Onset More than a month ago   Pain Frequency Constant   Aggravating Factors  bending it, overdoing it    Pain Relieving Factors ice, out of meds   Multiple Pain Sites No                         [facility] Adult PT Treatment/Exercise - 05/22/17 0944      Knee/Hip Exercises: Stretches   Passive Hamstring Stretch Right;2 reps;30 seconds   Passive Hamstring Stretch Limitations with strap   Gastroc Stretch 2 reps;Right;30 seconds   Gastroc Stretch Limitations leaning into counter    Other Knee/Hip Stretches R knee flexion lunge stretch into 6" step 5" x 10 reps     Knee/Hip Exercises: Aerobic   Nustep Lvl 3, 6 min; cues to focus in full knee extension     Knee/Hip Exercises: Supine   Quad Sets Right;10 reps   Quad Sets Limitations 5" hold    Short Arc [employer] Right;15 reps   Short Arc Quad Sets Limitations 3#; 3" hold    Straight Leg Raises Right;1 set;15 reps   Straight Leg Raises Limitations 2 sets; quad set prior to each rep    Knee Flexion Right;15 reps   Knee Flexion Limitations heel on p-ball and flexion stretch assist with strap 5" x 10 reps     Vasopneumatic   Number Minutes Vasopneumatic  15 minutes  Vasopnuematic Location  Knee  R    Vasopneumatic Pressure Low   Vasopneumatic Temperature  32 deg      Manual Therapy   Manual Therapy Joint mobilization   Manual therapy comments Supine    Joint Mobilization Patellar mobs all directions (focusing on superior/inferior) for improved ROM                   PT Short Term Goals - 05/14/17 1310      PT SHORT TERM GOAL #1   Title Pt will be I with initial HEP for knee AROM and strength.    Time 4   Period Weeks   Status New   Target Date 06/11/17     PT SHORT TERM GOAL #2   Title Pt will be able to extend leg on mat to -8 deg for ROM and manual work.     Time 4   Period Weeks   Status New   Target Date 06/11/17     PT SHORT TERM GOAL #3   Title Pt will be able to stand to cook for 15 min with only min pain increase from baseline.    Time 4    Period Weeks   Status New   Target Date 06/11/17           PT Long Term Goals - 05/14/17 1312      PT LONG TERM GOAL #1   Title Pt will be I with more advanced HEP and concepts of RICE to control swelling.    Time 8   Period Weeks   Status New   Target Date 07/09/17     PT LONG TERM GOAL #2   Title Pt will increase strength in bilateral LE's to 4+/5 throughout knees and hips for improved functional mobility.    Time 8   Period Weeks   Status New   Target Date 07/09/17     PT LONG TERM GOAL #3   Title Pt will be able to go up and down 12 stairs with 1 rail reciprocally and safety, min pain.    Time 8   Period Weeks   Status New   Target Date 07/09/17     PT LONG TERM GOAL #4   Title Pt with be able to stand and walking in the community for up to 45 min with only min pain in Rt. knee    Time 8   Period Weeks   Status New   Target Date 07/09/17               Plan - 05/22/17 0948    Clinical Impression Statement Pt. doing well today.  Seen to start treatment with significant R LE swelling.  HEP to start treatment with pt. able to demo good overall technique.  Significant fall in knee pain with therex.  Some manual patellar mobs to improve ROM.  Still with ~ 5 dg quad lag with SLR.  Antalgic gait throughout treatment with SPC however some improvement in edama following therex and ice/compression to end treatment.  Pt. encouraged to perform heel-prop throughout day at home to encourage full knee extension.   PT Treatment/Interventions ADLs/Self Care Home Management;Electrical Stimulation;Cryotherapy;Functional mobility training;Balance training;Therapeutic exercise;Therapeutic activities;DME Instruction;Stair training;Gait training;Ultrasound;Manual techniques;Passive range of motion;Taping;Vasopneumatic Device;Neuromuscular re-education;Patient/family education;Manual lymph drainage   PT Next Visit Plan Manage swelling; Continues manual for ROM and patellar mobility;  Vaso      Patient will benefit from skilled therapeutic intervention in order to improve  the following deficits and impairments:  Decreased activity tolerance, Decreased strength, Increased fascial restricitons, Impaired flexibility, Impaired UE functional use, Pain, Postural dysfunction, Obesity, Increased edema, Decreased range of motion, Decreased balance, Decreased endurance, Decreased mobility, Difficulty walking, Impaired sensation, Hypomobility  Visit Diagnosis: Localized edema  Muscle weakness (generalized)  Stiffness of right knee, not elsewhere classified  Acute pain of right knee  Difficulty in walking, not elsewhere classified     Problem List Patient Active Problem List   Diagnosis Date Noted  . Total knee replacement status 04/17/2017  . GERD (gastroesophageal reflux disease) 11/27/2016  . Unilateral primary osteoarthritis, right knee 10/18/2016  . Neuropathy 07/25/2016  . Acute cholecystitis 06/05/2016  . Diabetes mellitus without complication (HCC) 05/18/2016  . Cholelithiasis 04/09/2016  . Major depressive disorder, single episode 04/09/2016  . Leg DVT (deep venous thromboembolism), chronic (HCC) 12/08/2014  . Family history of diabetes mellitus (DM) 12/08/2014  . DVT, femoral, acute (HCC) 03/22/2014  . Moderate major depression, single episode (HCC) 08/22/2011  . Constipation 11/22/2010  . ANKLE EDEMA 10/19/2010  . Essential hypertension 10/12/2010    Kermit Balo, PTA 05/22/17 11:35 AM   [facility] [address] [place], [place], [address] Phone: [phone]   Fax:  [phone]  Name: David Dunlap MRN: [id number] Date of Birth: 17-Sep-1971

## 2017-05-23 ENCOUNTER — Ambulatory Visit: Payer: No Typology Code available for payment source | Admitting: Physical Therapy

## 2017-05-23 DIAGNOSIS — M25661 Stiffness of right knee, not elsewhere classified: Secondary | ICD-10-CM

## 2017-05-23 DIAGNOSIS — M6281 Muscle weakness (generalized): Secondary | ICD-10-CM

## 2017-05-23 DIAGNOSIS — R262 Difficulty in walking, not elsewhere classified: Secondary | ICD-10-CM

## 2017-05-23 DIAGNOSIS — R600 Localized edema: Secondary | ICD-10-CM

## 2017-05-23 DIAGNOSIS — M25561 Pain in right knee: Secondary | ICD-10-CM

## 2017-05-23 NOTE — Patient Instructions (Addendum)
   Remember to  Slowly walk and don't limp or bend forward during during walking.   Go through whole range of hip motion as shown in clinic    Garen LahLawrie Beardsley, PT Certified Exercise Expert for the Aging Adult  05/23/17 9:51 AM Phone: [phone] Fax: [phone]

## 2017-05-23 NOTE — Therapy (Signed)
[facility] Outpatient Rehabilitation [facility] [address] [place], [place], [address] Phone: [phone]   Fax:  [phone]  Physical Therapy Treatment  Patient Details  Name: David Dunlap MRN: [id number] Date of Birth: 1971/02/24 Referring Provider: Dr. Gershon Mussel   Encounter Date: 05/23/2017      PT End of Session - 05/23/17 1822    Visit Number 3   Number of Visits 16   Date for PT Re-Evaluation 07/09/17   PT Start Time 0932   PT Stop Time 1030   PT Time Calculation (min) 58 min   Activity Tolerance Patient tolerated treatment well   Behavior During Therapy [facility] for tasks assessed/performed      Past Medical History:  Diagnosis Date  . Arthritis   . Depression   . Diabetes mellitus without complication (HCC) 05/2016   NEW ONSET    WITH FAMILY HISTORY  . DVT (deep venous thrombosis) (HCC)   . Essential hypertension   . Gallstones 05/2016  . GERD (gastroesophageal reflux disease)     Past Surgical History:  Procedure Laterality Date  . CHOLECYSTECTOMY N/A 06/07/2016   Procedure: LAPAROSCOPIC CHOLECYSTECTOMY WITH ATTEMPTED INTRAOPERATIVE CHOLANGIOGRAM;  Surgeon: Violeta Gelinas, MD;  Location: [facility] OR;  Service: General;  Laterality: N/A;  . LIGAMENT REPAIR     R forearm  . TOTAL KNEE ARTHROPLASTY Right 04/17/2017   Procedure: RIGHT TOTAL KNEE ARTHROPLASTY;  Surgeon: Tarry Kos, MD;  Location: [facility] OR;  Service: Orthopedics;  Laterality: Right;    There were no vitals filed for this visit.      Subjective Assessment - 05/23/17 0939    Subjective My pain level is an 8/10 today   Limitations Sitting;Lifting;Standing;Walking;House hold activities   How long can you sit comfortably? 30 min    How long can you stand comfortably? 10-15 min    How long can you walk comfortably? could do 30 min moves slowly, uses cane , walks up and down his street everyday    Patient Stated Goals Pt would be able to have less pain, walk without cane.    Currently  in Pain? Yes   Pain Score 8    Pain Location Knee   Pain Orientation Right;Anterior   Pain Descriptors / Indicators Aching   Pain Type Surgical pain   Pain Onset More than a month ago   Pain Frequency Constant            [facility] PT Assessment - 05/23/17 0941      AROM   Right Knee Extension -13   Right Knee Flexion 108  after treatment                     [facility] Adult PT Treatment/Exercise - 05/23/17 0941      Ambulation/Gait   Assistive device Straight cane   Gait Pattern Antalgic;Decreased stance time - left   Gait Comments gait reinformcement with cane with heel toe gait and stance phase      Self-Care   Self-Care Other Self-Care Comments   Other Self-Care Comments  educated and returned demo on patellar mobs in all directions but emphasis on sup and inferior self mobs with handouts.     Knee/Hip Exercises: Stretches   Passive Hamstring Stretch Right;2 reps;30 seconds   Passive Hamstring Stretch Limitations with strap   Gastroc Stretch 2 reps;Right;30 seconds   Gastroc Stretch Limitations leaning into counter      Knee/Hip Exercises: Aerobic   Recumbent Bike level 3 5  minutes for warm up     Knee/Hip Exercises: Standing   Other Standing Knee Exercises --   Other Standing Knee Exercises total joint gait assistance exercises with r and L UE wall slides and wt shifting.x 20 each     Knee/Hip Exercises: Supine   Quad Sets Right;10 reps   Short Arc Quad Sets Right;15 reps   Straight Leg Raises Right;1 set;15 reps   Straight Leg Raises Limitations 2 sets; quad set prior to each rep    Knee Flexion Right;15 reps   Knee Flexion Limitations on floor with left foot assisting at end range.  told to use strap at home to keep knee in line     Vasopneumatic   Number Minutes Vasopneumatic  15 minutes   Vasopnuematic Location  Knee  R    Vasopneumatic Pressure Low   Vasopneumatic Temperature  32 deg      Manual Therapy   Manual Therapy Joint mobilization    Manual therapy comments Supine    Joint Mobilization Patellar mobs all directions (focusing on superior/inferior) for improved ROM                   PT Short Term Goals - 05/23/17 1818      PT SHORT TERM GOAL #1   Title Pt will be I with initial HEP for knee AROM and strength.    Baseline Pt given initial gait wt shifting and reinforcing initial HEP   Time 4   Period Weeks   Status On-going     PT SHORT TERM GOAL #2   Title Pt will be able to extend leg on mat to -8 deg for ROM and manual work.     Baseline Pt currently -13   Time 4   Period Weeks   Status On-going     PT SHORT TERM GOAL #3   Title Pt will be able to stand to cook for 15 min with only min pain increase from baseline.    Baseline unable to stand and cook. initially with 8/10 pain today   Time 4   Period Weeks   Status On-going           PT Long Term Goals - 05/14/17 1312      PT LONG TERM GOAL #1   Title Pt will be I with more advanced HEP and concepts of RICE to control swelling.    Time 8   Period Weeks   Status New   Target Date 07/09/17     PT LONG TERM GOAL #2   Title Pt will increase strength in bilateral LE's to 4+/5 throughout knees and hips for improved functional mobility.    Time 8   Period Weeks   Status New   Target Date 07/09/17     PT LONG TERM GOAL #3   Title Pt will be able to go up and down 12 stairs with 1 rail reciprocally and safety, min pain.    Time 8   Period Weeks   Status New   Target Date 07/09/17     PT LONG TERM GOAL #4   Title Pt with be able to stand and walking in the community for up to 45 min with only min pain in Rt. knee    Time 8   Period Weeks   Status New   Target Date 07/09/17               Plan - 05/23/17 1016  Clinical Impression Statement Pt doing well today but limping into clinic.  Time spent with him with gait technique and patellar mobs today and soft tissue work. Pt AROM of right knee -13 ext and 108 flexion   Rehab  Potential Excellent   PT Frequency 2x / week   PT Duration 8 weeks   PT Treatment/Interventions ADLs/Self Care Home Management;Electrical Stimulation;Cryotherapy;Functional mobility training;Balance training;Therapeutic exercise;Therapeutic activities;DME Instruction;Stair training;Gait training;Ultrasound;Manual techniques;Passive range of motion;Taping;Vasopneumatic Device;Neuromuscular re-education;Patient/family education;Manual lymph drainage   PT Next Visit Plan Manage swelling; Continues manual for ROM and patellar mobility; Vaso progress exercises   PT Home Exercise Plan quad set supine and standing, SLR, LAQ, gait total joint wall wt shifting and patellar mobs for home given   Consulted and Agree with Plan of Care Patient      Patient will benefit from skilled therapeutic intervention in order to improve the following deficits and impairments:  Decreased activity tolerance, Decreased strength, Increased fascial restricitons, Impaired flexibility, Impaired UE functional use, Pain, Postural dysfunction, Obesity, Increased edema, Decreased range of motion, Decreased balance, Decreased endurance, Decreased mobility, Difficulty walking, Impaired sensation, Hypomobility  Visit Diagnosis: Localized edema  Muscle weakness (generalized)  Stiffness of right knee, not elsewhere classified  Acute pain of right knee  Difficulty in walking, not elsewhere classified     Problem List Patient Active Problem List   Diagnosis Date Noted  . Total knee replacement status 04/17/2017  . GERD (gastroesophageal reflux disease) 11/27/2016  . Unilateral primary osteoarthritis, right knee 10/18/2016  . Neuropathy 07/25/2016  . Acute cholecystitis 06/05/2016  . Diabetes mellitus without complication (HCC) 05/18/2016  . Cholelithiasis 04/09/2016  . Major depressive disorder, single episode 04/09/2016  . Leg DVT (deep venous thromboembolism), chronic (HCC) 12/08/2014  . Family history of diabetes  mellitus (DM) 12/08/2014  . DVT, femoral, acute (HCC) 03/22/2014  . Moderate major depression, single episode (HCC) 08/22/2011  . Constipation 11/22/2010  . ANKLE EDEMA 10/19/2010  . Essential hypertension 10/12/2010    Garen LahLawrie Plumer Mittelstaedt, PT Certified Exercise Expert for the Aging Adult  05/23/17 6:24 PM Phone: [phone] Fax: [phone]  [facility] [address] [place], [place], [address] Phone: [phone]   Fax:  [phone]  Name: David Dunlap MRN: [id number] Date of Birth: May 07, 1971

## 2017-05-28 ENCOUNTER — Ambulatory Visit: Payer: No Typology Code available for payment source | Admitting: Physical Therapy

## 2017-05-29 ENCOUNTER — Other Ambulatory Visit (INDEPENDENT_AMBULATORY_CARE_PROVIDER_SITE_OTHER): Payer: Self-pay | Admitting: Orthopaedic Surgery

## 2017-05-29 NOTE — Telephone Encounter (Signed)
Ok for refill? 

## 2017-05-30 ENCOUNTER — Ambulatory Visit (INDEPENDENT_AMBULATORY_CARE_PROVIDER_SITE_OTHER): Payer: Self-pay

## 2017-05-30 ENCOUNTER — Encounter (INDEPENDENT_AMBULATORY_CARE_PROVIDER_SITE_OTHER): Payer: Self-pay | Admitting: Orthopaedic Surgery

## 2017-05-30 ENCOUNTER — Ambulatory Visit (INDEPENDENT_AMBULATORY_CARE_PROVIDER_SITE_OTHER): Payer: Self-pay | Admitting: Orthopaedic Surgery

## 2017-05-30 DIAGNOSIS — M1711 Unilateral primary osteoarthritis, right knee: Secondary | ICD-10-CM

## 2017-05-30 MED ORDER — HYDROCODONE-ACETAMINOPHEN 7.5-325 MG PO TABS: 1.0000 | ORAL_TABLET | Freq: Every day | ORAL | 0 refills | Status: DC | PRN

## 2017-05-30 NOTE — Progress Notes (Signed)
Patient is 6 weeks status post right uncemented total knee replacement. 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 He is doing outpatient physical therapy twice a week. He is ambulating with a cane. He'll have occasional sharp pain.  Physical exam shows a fully healed surgical scar.  Range of motion is excellent. Essentially benign right knee exam.  X-ray show stable alignment of the right total knee replacement without any evidence of complications.  Continue physical therapy. Reminded him of dental prophylaxis. Follow-up in 6 weeks for recheck. No x-rays needed.

## 2017-05-31 ENCOUNTER — Ambulatory Visit: Payer: No Typology Code available for payment source | Admitting: Physical Therapy

## 2017-05-31 DIAGNOSIS — R262 Difficulty in walking, not elsewhere classified: Secondary | ICD-10-CM

## 2017-05-31 DIAGNOSIS — M25561 Pain in right knee: Secondary | ICD-10-CM

## 2017-05-31 DIAGNOSIS — M25661 Stiffness of right knee, not elsewhere classified: Secondary | ICD-10-CM

## 2017-05-31 DIAGNOSIS — R600 Localized edema: Secondary | ICD-10-CM

## 2017-05-31 DIAGNOSIS — M6281 Muscle weakness (generalized): Secondary | ICD-10-CM

## 2017-05-31 NOTE — Therapy (Signed)
[facility] [address] [place], [place], [address] Phone: [phone]   Fax:  [phone]  Physical Therapy Treatment  Patient Details  Name: David Dunlap MRN: [id number] Date of Birth: 1971/08/21 Referring Provider: Dr. Gershon Mussel   Encounter Date: 05/31/2017      PT End of Session - 05/31/17 1045    Visit Number 4   Number of Visits 16   Date for PT Re-Evaluation 07/09/17   PT Start Time 0930   PT Stop Time 1027   PT Time Calculation (min) 57 min   Activity Tolerance Patient tolerated treatment well   Behavior During Therapy [facility] for tasks assessed/performed      Past Medical History:  Diagnosis Date  . Arthritis   . Depression   . Diabetes mellitus without complication (HCC) 05/2016   NEW ONSET    WITH FAMILY HISTORY  . DVT (deep venous thrombosis) (HCC)   . Essential hypertension   . Gallstones 05/2016  . GERD (gastroesophageal reflux disease)     Past Surgical History:  Procedure Laterality Date  . CHOLECYSTECTOMY N/A 06/07/2016   Procedure: LAPAROSCOPIC CHOLECYSTECTOMY WITH ATTEMPTED INTRAOPERATIVE CHOLANGIOGRAM;  Surgeon: Violeta Gelinas, MD;  [facility];  Service: General;  Laterality: N/A;  . LIGAMENT REPAIR     R forearm  . TOTAL KNEE ARTHROPLASTY Right 04/17/2017   Procedure: RIGHT TOTAL KNEE ARTHROPLASTY;  Surgeon: Tarry Kos, MD;  Location: [facility] OR;  Service: Orthopedics;  Laterality: Right;    There were no vitals filed for this visit.      Subjective Assessment - 05/31/17 0935    Subjective Patient reports his knee is sore. His pain level is about a 6/10. He continues to use a cane for gait.    Limitations Sitting;Lifting;Standing;Walking;House hold activities   How long can you sit comfortably? 30 min    How long can you stand comfortably? 10-15 min    How long can you walk comfortably? could do 30 min moves slowly, uses cane , walks up and down his street everyday    Patient Stated  Goals Pt would be able to have less pain, walk without cane.    Currently in Pain? Yes   Pain Score 6    Pain Location Knee   Pain Orientation Right;Anterior   Pain Descriptors / Indicators Aching   Pain Type Surgical pain   Pain Onset More than a month ago   Pain Frequency Constant   Aggravating Factors  bending, over-doing it    Pain Relieving Factors ice    Multiple Pain Sites No            [facility] PT Assessment - 05/31/17 0001      AROM   Right Knee Extension -8   Right Knee Flexion 112                     [facility] Adult PT Treatment/Exercise - 05/31/17 0001      Knee/Hip Exercises: Stretches   Passive Hamstring Stretch Right;2 reps;30 seconds   Passive Hamstring Stretch Limitations with strap   Gastroc Stretch 2 reps;Right;30 seconds   Gastroc Stretch Limitations leaning into counter      Knee/Hip Exercises: Aerobic   Recumbent Bike level 3 5 minutes for warm up     Knee/Hip Exercises: Standing   Heel Raises Limitations weight shifts forward    Hip Flexion Limitations standing march 2x0 low range with left march      Knee/Hip  Exercises: Supine   Quad Sets Limitations 2x10 5 second hold   Short Arc Quad Sets Limitations 2x10 3#    Straight Leg Raises Limitations 1x8 1x10 limited range    Knee Flexion Right;15 reps   Knee Flexion Limitations on floor with left foot assisting at end range.  told to use strap at home to keep knee in line     Vasopneumatic   Number Minutes Vasopneumatic  15 minutes   Vasopnuematic Location  Knee  R    Vasopneumatic Pressure Low   Vasopneumatic Temperature  32 deg      Manual Therapy   Manual Therapy Joint mobilization   Joint Mobilization Patellar mobs all directions (focusing on superior/inferior) for improved ROM PROM into flexion and extnesion                 PT Education - 05/31/17 0936    Education provided Yes   Education Details reviewed HEP and technique wqith ther-ex    Person(s) Educated Patient    Methods Explanation;Demonstration;Tactile cues   Comprehension Verbalized understanding;Verbal cues required;Returned demonstration;Tactile cues required          PT Short Term Goals - 05/23/17 1818      PT SHORT TERM GOAL #1   Title Pt will be I with initial HEP for knee AROM and strength.    Baseline Pt given initial gait wt shifting and reinforcing initial HEP   Time 4   Period Weeks   Status On-going     PT SHORT TERM GOAL #2   Title Pt will be able to extend leg on mat to -8 deg for ROM and manual work.     Baseline Pt currently -13   Time 4   Period Weeks   Status On-going     PT SHORT TERM GOAL #3   Title Pt will be able to stand to cook for 15 min with only min pain increase from baseline.    Baseline unable to stand and cook. initially with 8/10 pain today   Time 4   Period Weeks   Status On-going           PT Long Term Goals - 05/14/17 1312      PT LONG TERM GOAL #1   Title Pt will be I with more advanced HEP and concepts of RICE to control swelling.    Time 8   Period Weeks   Status New   Target Date 07/09/17     PT LONG TERM GOAL #2   Title Pt will increase strength in bilateral LE's to 4+/5 throughout knees and hips for improved functional mobility.    Time 8   Period Weeks   Status New   Target Date 07/09/17     PT LONG TERM GOAL #3   Title Pt will be able to go up and down 12 stairs with 1 rail reciprocally and safety, min pain.    Time 8   Period Weeks   Status New   Target Date 07/09/17     PT LONG TERM GOAL #4   Title Pt with be able to stand and walking in the community for up to 45 min with only min pain in Rt. knee    Time 8   Period Weeks   Status New   Target Date 07/09/17               Plan - 05/31/17 1050    Clinical Impression Statement Patient reported soreness  after treatment. His rnage is progresing well. he was advised to really work on strengthening at home. he had some difficulty with stragiht leg raises and  SAQ today.    Clinical Presentation Stable   Clinical Decision Making Low   Rehab Potential Excellent   PT Frequency 2x / week   PT Duration 8 weeks   PT Treatment/Interventions ADLs/Self Care Home Management;Electrical Stimulation;Cryotherapy;Functional mobility training;Balance training;Therapeutic exercise;Therapeutic activities;DME Instruction;Stair training;Gait training;Ultrasound;Manual techniques;Passive range of motion;Taping;Vasopneumatic Device;Neuromuscular re-education;Patient/family education;Manual lymph drainage   PT Next Visit Plan Manage swelling; Continues manual for ROM and patellar mobility; Vaso progress exercises   PT Home Exercise Plan quad set supine and standing, SLR, LAQ, gait total joint wall wt shifting and patellar mobs for home given   Consulted and Agree with Plan of Care Patient      Patient will benefit from skilled therapeutic intervention in order to improve the following deficits and impairments:  Decreased activity tolerance, Decreased strength, Increased fascial restricitons, Impaired flexibility, Impaired UE functional use, Pain, Postural dysfunction, Obesity, Increased edema, Decreased range of motion, Decreased balance, Decreased endurance, Decreased mobility, Difficulty walking, Impaired sensation, Hypomobility  Visit Diagnosis: Localized edema  Muscle weakness (generalized)  Stiffness of right knee, not elsewhere classified  Acute pain of right knee  Difficulty in walking, not elsewhere classified     Problem List Patient Active Problem List   Diagnosis Date Noted  . Total knee replacement status 04/17/2017  . GERD (gastroesophageal reflux disease) 11/27/2016  . Unilateral primary osteoarthritis, right knee 10/18/2016  . Neuropathy 07/25/2016  . Acute cholecystitis 06/05/2016  . Diabetes mellitus without complication (HCC) 05/18/2016  . Cholelithiasis 04/09/2016  . Major depressive disorder, single episode 04/09/2016  . Leg DVT (deep  venous thromboembolism), chronic (HCC) 12/08/2014  . Family history of diabetes mellitus (DM) 12/08/2014  . DVT, femoral, acute (HCC) 03/22/2014  . Moderate major depression, single episode (HCC) 08/22/2011  . Constipation 11/22/2010  . ANKLE EDEMA 10/19/2010  . Essential hypertension 10/12/2010    Dessie Coma PT DPT  05/31/2017, 10:53 AM  [facility] [address] [place], [place], [address] Phone: [phone]   Fax:  [phone]  Name: NAVI ERBER MRN: [id number] Date of Birth: June 18, 1971

## 2017-06-04 ENCOUNTER — Ambulatory Visit: Payer: No Typology Code available for payment source | Admitting: Physical Therapy

## 2017-06-04 ENCOUNTER — Encounter: Payer: Self-pay | Admitting: Physical Therapy

## 2017-06-04 DIAGNOSIS — R262 Difficulty in walking, not elsewhere classified: Secondary | ICD-10-CM

## 2017-06-04 DIAGNOSIS — R600 Localized edema: Secondary | ICD-10-CM

## 2017-06-04 DIAGNOSIS — M25661 Stiffness of right knee, not elsewhere classified: Secondary | ICD-10-CM

## 2017-06-04 DIAGNOSIS — M25561 Pain in right knee: Secondary | ICD-10-CM

## 2017-06-04 DIAGNOSIS — M6281 Muscle weakness (generalized): Secondary | ICD-10-CM

## 2017-06-04 NOTE — Patient Instructions (Signed)
From exercise drawer. Hamstring curl with band 1-2 x a day 10-30 x each\0 to 5 second holds

## 2017-06-04 NOTE — Therapy (Signed)
[facility] [address] [place], [place], [address] Phone: [phone]   Fax:  [phone]  Physical Therapy Treatment  Patient Details  Name: David Dunlap MRN: [id number] Date of Birth: 10-01-1970 Referring Provider: Dr. Gershon Mussel   Encounter Date: 06/04/2017      PT End of Session - 06/04/17 1011    Visit Number 5   Number of Visits 16   Date for PT Re-Evaluation 07/09/17   PT Start Time 0915   PT Stop Time 1015   PT Time Calculation (min) 60 min   Activity Tolerance Patient tolerated treatment well   Behavior During Therapy [facility] for tasks assessed/performed      Past Medical History:  Diagnosis Date  . Arthritis   . Depression   . Diabetes mellitus without complication (HCC) 05/2016   NEW ONSET    WITH FAMILY HISTORY  . DVT (deep venous thrombosis) (HCC)   . Essential hypertension   . Gallstones 05/2016  . GERD (gastroesophageal reflux disease)     Past Surgical History:  Procedure Laterality Date  . CHOLECYSTECTOMY N/A 06/07/2016   Procedure: LAPAROSCOPIC CHOLECYSTECTOMY WITH ATTEMPTED INTRAOPERATIVE CHOLANGIOGRAM;  Surgeon: Violeta Gelinas, MD;  Location: [facility] OR;  Service: General;  Laterality: N/A;  . LIGAMENT REPAIR     R forearm  . TOTAL KNEE ARTHROPLASTY Right 04/17/2017   Procedure: RIGHT TOTAL KNEE ARTHROPLASTY;  Surgeon: Tarry Kos, MD;  [facility];  Service: Orthopedics;  Laterality: Right;    There were no vitals filed for this visit.      Subjective Assessment - 06/04/17 0921    Subjective (P)  pain 5/10/  Pt is helping a whole lot with motion, walking and range.   Patient is accompained by: (P)  --  friend in the lobby   Currently in Pain? (P)  Yes   Pain Score (P)  5    Pain Location (P)  Knee   Pain Orientation (P)  Right;Anterior;Posterior   Pain Descriptors / Indicators (P)  Sharp;Aching   Pain Type (P)  Surgical pain   Pain Frequency (P)  Constant   Aggravating Factors   (P)  Too long on feet,  end range bending,  overdoing it                          [facility] Adult PT Treatment/Exercise - 06/04/17 0001      Self-Care   Heat/Ice Application may now use heat to increase tissue flexibility.  Take care for brief periods.  Discontinue if heat makes it ach.     Other Self-Care Comments  self mobs with rolled towel.,  verbal     Knee/Hip Exercises: Stretches   Passive Hamstring Stretch 3 reps;30 seconds   Quad Stretch 3 reps;30 seconds   Quad Stretch Limitations with patellar inferior manual glides over edge of table   Other Knee/Hip Stretches self stretch with rolled towel     Knee/Hip Exercises: Aerobic   Recumbent Bike level 3 6 minutes for warm up     Knee/Hip Exercises: Standing   Other Standing Knee Exercises Wall slide 10 x each facing wall.  cues needed for technique     Knee/Hip Exercises: Seated   Hamstring Curl 10 reps;2 sets  red band HEP.  Red and green band issued for progression     Moist Heat Therapy   Number Minutes Moist Heat 20 Minutes  concurrent with manual    Moist  Heat Location Knee     Vasopneumatic   Number Minutes Vasopneumatic  15 minutes   Vasopnuematic Location  Knee   Vasopneumatic Pressure Low   Vasopneumatic Temperature  32     Manual Therapy   Manual therapy comments retrograde for soft tissue   Joint Mobilization parellar mobs,  medial lateral and inferior glides.   distraction and P/A tibia glides with movement into flexion.  taught patient how to do.  ROM increased.                 PT Education - 06/04/17 1011    Education provided Yes   Education Details HEP,  self mobs   Person(s) Educated Patient   Methods Explanation;Demonstration;Tactile cues;Verbal cues;Handout   Comprehension Verbalized understanding;Returned demonstration          PT Short Term Goals - 05/23/17 1818      PT SHORT TERM GOAL #1   Title Pt will be I with initial HEP for knee AROM and strength.     Baseline Pt given initial gait wt shifting and reinforcing initial HEP   Time 4   Period Weeks   Status On-going     PT SHORT TERM GOAL #2   Title Pt will be able to extend leg on mat to -8 deg for ROM and manual work.     Baseline Pt currently -13   Time 4   Period Weeks   Status On-going     PT SHORT TERM GOAL #3   Title Pt will be able to stand to cook for 15 min with only min pain increase from baseline.    Baseline unable to stand and cook. initially with 8/10 pain today   Time 4   Period Weeks   Status On-going           PT Long Term Goals - 05/14/17 1312      PT LONG TERM GOAL #1   Title Pt will be I with more advanced HEP and concepts of RICE to control swelling.    Time 8   Period Weeks   Status New   Target Date 07/09/17     PT LONG TERM GOAL #2   Title Pt will increase strength in bilateral LE's to 4+/5 throughout knees and hips for improved functional mobility.    Time 8   Period Weeks   Status New   Target Date 07/09/17     PT LONG TERM GOAL #3   Title Pt will be able to go up and down 12 stairs with 1 rail reciprocally and safety, min pain.    Time 8   Period Weeks   Status New   Target Date 07/09/17     PT LONG TERM GOAL #4   Title Pt with be able to stand and walking in the community for up to 45 min with only min pain in Rt. knee    Time 8   Period Weeks   Status New   Target Date 07/09/17               Plan - 06/04/17 1012    Clinical Impression Statement Pain 6/10 post session prior to cold.  AA ROM 115 right knee.  Less limp post wall slides.  he is able to walk inside some without cane.  he is doing his exercises.   PT Next Visit Plan Manage swelling; Continues manual for ROM and patellar mobility; Vaso progress exercises.  Try step ups  PT Home Exercise Plan quad set supine and standing, SLR, LAQ, gait total joint wall wt shifting and patellar mobs for home given   Consulted and Agree with Plan of Care Patient      Patient  will benefit from skilled therapeutic intervention in order to improve the following deficits and impairments:     Visit Diagnosis: Localized edema  Muscle weakness (generalized)  Stiffness of right knee, not elsewhere classified  Acute pain of right knee  Difficulty in walking, not elsewhere classified     Problem List Patient Active Problem List   Diagnosis Date Noted  . Total knee replacement status 04/17/2017  . GERD (gastroesophageal reflux disease) 11/27/2016  . Unilateral primary osteoarthritis, right knee 10/18/2016  . Neuropathy 07/25/2016  . Acute cholecystitis 06/05/2016  . Diabetes mellitus without complication (HCC) 05/18/2016  . Cholelithiasis 04/09/2016  . Major depressive disorder, single episode 04/09/2016  . Leg DVT (deep venous thromboembolism), chronic (HCC) 12/08/2014  . Family history of diabetes mellitus (DM) 12/08/2014  . DVT, femoral, acute (HCC) 03/22/2014  . Moderate major depression, single episode (HCC) 08/22/2011  . Constipation 11/22/2010  . ANKLE EDEMA 10/19/2010  . Essential hypertension 10/12/2010    Ilhan Madan PTA 06/04/2017, 10:15 AM  [facility] [address] [place], [place], [address] Phone: [phone]   Fax:  [phone]  Name: David Dunlap MRN: [id number] Date of Birth: June 27, 1971

## 2017-06-07 ENCOUNTER — Ambulatory Visit: Payer: No Typology Code available for payment source | Admitting: Physical Therapy

## 2017-06-11 ENCOUNTER — Ambulatory Visit: Payer: No Typology Code available for payment source | Admitting: Physical Therapy

## 2017-06-11 DIAGNOSIS — M25661 Stiffness of right knee, not elsewhere classified: Secondary | ICD-10-CM

## 2017-06-11 DIAGNOSIS — M25561 Pain in right knee: Secondary | ICD-10-CM

## 2017-06-11 DIAGNOSIS — R600 Localized edema: Secondary | ICD-10-CM

## 2017-06-11 DIAGNOSIS — R262 Difficulty in walking, not elsewhere classified: Secondary | ICD-10-CM

## 2017-06-11 DIAGNOSIS — M6281 Muscle weakness (generalized): Secondary | ICD-10-CM

## 2017-06-11 NOTE — Patient Instructions (Signed)
Quad Set    With other leg bent, foot flat, slowly tighten muscles on thigh of straight leg while counting out loud to ___5_. Repeat with other leg. Repeat ___10_ times. Do ___3-5_ sessions per day.  http://gt2.exer.us/275   Copyright  VHI. All rights reserved.  Straight Leg Raise    Tighten stomach and slowly raise locked right leg __10-12__ inches from floor. Repeat _10___ times per set. Do __2__ sets per session. Do _2_ sessions per day.  http://orth.exer.us/1102   Copyright  VHI. All rights reserved.

## 2017-06-11 NOTE — Therapy (Signed)
[facility] [place], [place], [address] Phone: [phone]   Fax:  [phone]  Physical Therapy Treatment  Patient Details  Name: David Dunlap MRN: [id number] Date of Birth: 23-Oct-1970 Referring Provider: Dr. Frankey Shown   Encounter Date: 06/11/2017      PT End of Session - 06/11/17 0944    Visit Number 6   Number of Visits 16   Date for PT Re-Evaluation 07/09/17   PT Start Time 0940  late    PT Stop Time 1031   PT Time Calculation (min) 51 min   Activity Tolerance Patient tolerated treatment well   Behavior During Therapy [facility] for tasks assessed/performed      Past Medical History:  Diagnosis Date  . Arthritis   . Depression   . Diabetes mellitus without complication ([place]) 02/3015   NEW ONSET    WITH FAMILY HISTORY  . DVT (deep venous thrombosis) ([place])   . Essential hypertension   . Gallstones 05/2016  . GERD (gastroesophageal reflux disease)     Past Surgical History:  Procedure Laterality Date  . CHOLECYSTECTOMY N/A 06/07/2016   Procedure: LAPAROSCOPIC CHOLECYSTECTOMY WITH ATTEMPTED INTRAOPERATIVE CHOLANGIOGRAM;  Surgeon: Georganna Skeans, MD;  Location: [place];  Service: General;  Laterality: N/A;  . LIGAMENT REPAIR     R forearm  . TOTAL KNEE ARTHROPLASTY Right 04/17/2017   Procedure: RIGHT TOTAL KNEE ARTHROPLASTY;  Surgeon: Leandrew Koyanagi, MD;  Location: [place];  Service: Orthopedics;  Laterality: Right;    There were no vitals filed for this visit.      Subjective Assessment - 06/11/17 0942    Subjective Pt walks in a bit late.  Limping today.  Knee is stiff.    Currently in Pain? Yes   Pain Score 7    Pain Location Knee   Pain Orientation Right;Anterior   Pain Descriptors / Indicators Aching   Pain Type Surgical pain   Pain Onset More than a month ago   Pain Frequency Constant   Aggravating Factors  over stretching, standing too much    Pain Relieving Factors ice              [facility] PT Assessment - 06/11/17 0001      AROM   Right Knee Extension --  PROM to -6   Right Knee Flexion 110           [facility] Adult PT Treatment/Exercise - 06/11/17 0001      Knee/Hip Exercises: Stretches   Active Hamstring Stretch Right;2 reps;20 seconds   Active Hamstring Stretch Limitations seated    Passive Hamstring Stretch 3 reps;30 seconds   Passive Hamstring Stretch Limitations strap , -6 deg   Knee: Self-Stretch to increase Flexion Right;3 reps;30 seconds   Gastroc Stretch Right;3 reps   Gastroc Stretch Limitations slant board      Knee/Hip Exercises: Aerobic   Recumbent Bike level 3 , 6 minutes for warm up     Knee/Hip Exercises: Standing   Lateral Step Up Right;1 set;10 reps   Lateral Step Up Limitations stiff   Forward Step Up Right;1 set;10 reps;Hand Hold: 2;Step Height: 6"   Forward Step Up Limitations cues for control and technique      Knee/Hip Exercises: Supine   Quad Sets Right;10 reps   Straight Leg Raises Strengthening;Right;1 set;10 reps     Moist Heat Therapy   Number Minutes Moist Heat 15 Minutes   Moist Heat Location Knee     Electrical  Stimulation   Electrical Stimulation Location Rt. knee anterior   Electrical Stimulation Action IFC    Electrical Stimulation Parameters 19    Electrical Stimulation Goals Pain                PT Education - 06/11/17 1016    Education provided Yes   Education Details quad HEP, POC and IFC for pain control    Person(s) Educated Patient   Methods Explanation;Demonstration   Comprehension Verbalized understanding;Tactile cues required          PT Short Term Goals - 06/11/17 0946      PT SHORT TERM GOAL #1   Title Pt will be I with initial HEP for knee AROM and strength.    Baseline does not do SLR and quad set, reissued.    Status On-going     PT SHORT TERM GOAL #2   Title Pt will be able to extend leg on mat to -8 deg for ROM and manual work.     Status On-going     PT SHORT TERM GOAL #3    Title Pt will be able to stand to cook for 15 min with only min pain increase from baseline.    Status Achieved           PT Long Term Goals - 06/11/17 0948      PT LONG TERM GOAL #1   Title Pt will be I with more advanced HEP and concepts of RICE to control swelling.    Status On-going     PT LONG TERM GOAL #2   Title Pt will increase strength in bilateral LE's to 4+/5 throughout knees and hips for improved functional mobility.    Baseline R.t knee 4+/5    Status Partially Met     PT LONG TERM GOAL #3   Title Pt will be able to go up and down 12 stairs with 1 rail reciprocally and safety, min pain.    Baseline pain mod or more    Status On-going     PT LONG TERM GOAL #4   Title Pt with be able to stand and walking in the community for up to 45 min with only min pain in Rt. knee    Status On-going               Plan - 06/11/17 1107    Clinical Impression Statement Patient continues to have pain and difficulty with basic knee exercises, including quad sets and SLR.  Addressed pain with IFC today, knee felt warm to the touch, included ice to help this.  Less pain post session.  Asked to make more appts until he sees MD Oct. 18th.    PT Next Visit Plan Manage swelling; progress exercises.  Try step ups. Repeat IFC if favorable.    PT Home Exercise Plan quad set supine and standing, SLR, LAQ, gait total joint wall wt shifting and patellar mobs for home given   Consulted and Agree with Plan of Care Patient      Patient will benefit from skilled therapeutic intervention in order to improve the following deficits and impairments:  Decreased activity tolerance, Decreased strength, Increased fascial restricitons, Impaired flexibility, Impaired UE functional use, Pain, Postural dysfunction, Obesity, Increased edema, Decreased range of motion, Decreased balance, Decreased endurance, Decreased mobility, Difficulty walking, Impaired sensation, Hypomobility  Visit  Diagnosis: Localized edema  Muscle weakness (generalized)  Stiffness of right knee, not elsewhere classified  Acute pain of right knee  Difficulty in walking, not elsewhere classified     Problem List Patient Active Problem List   Diagnosis Date Noted  . Total knee replacement status 04/17/2017  . GERD (gastroesophageal reflux disease) 11/27/2016  . Unilateral primary osteoarthritis, right knee 10/18/2016  . Neuropathy 07/25/2016  . Acute cholecystitis 06/05/2016  . Diabetes mellitus without complication ([place]) 59/45/8592  . Cholelithiasis 04/09/2016  . Major depressive disorder, single episode 04/09/2016  . Leg DVT (deep venous thromboembolism), chronic ([place]) 12/08/2014  . Family history of diabetes mellitus (DM) 12/08/2014  . DVT, femoral, acute ([place]) 03/22/2014  . Moderate major depression, single episode ([place]) 08/22/2011  . Constipation 11/22/2010  . ANKLE EDEMA 10/19/2010  . Essential hypertension 10/12/2010    Alyshia Kernan 06/11/2017, 11:12 AM  [facility] [address] [place], [place], [address] Phone: [phone]   Fax:  [phone]  Name: KESHON MARKOVITZ MRN: [id number] Date of Birth: 1971/09/15  Raeford Razor, PT 06/11/17 11:13 AM Phone: [phone] Fax: [phone]

## 2017-06-12 ENCOUNTER — Encounter: Payer: Self-pay | Admitting: Family Medicine

## 2017-06-12 ENCOUNTER — Ambulatory Visit: Payer: Self-pay | Attending: Family Medicine | Admitting: Family Medicine

## 2017-06-12 VITALS — BP 120/83 | HR 79 | Temp 97.9°F | Wt 270.6 lb

## 2017-06-12 DIAGNOSIS — K219 Gastro-esophageal reflux disease without esophagitis: Secondary | ICD-10-CM | POA: Insufficient documentation

## 2017-06-12 DIAGNOSIS — F329 Major depressive disorder, single episode, unspecified: Secondary | ICD-10-CM | POA: Insufficient documentation

## 2017-06-12 DIAGNOSIS — I825Z3 Chronic embolism and thrombosis of unspecified deep veins of distal lower extremity, bilateral: Secondary | ICD-10-CM | POA: Insufficient documentation

## 2017-06-12 DIAGNOSIS — I10 Essential (primary) hypertension: Secondary | ICD-10-CM | POA: Insufficient documentation

## 2017-06-12 DIAGNOSIS — R29818 Other symptoms and signs involving the nervous system: Secondary | ICD-10-CM

## 2017-06-12 DIAGNOSIS — Z114 Encounter for screening for human immunodeficiency virus [HIV]: Secondary | ICD-10-CM

## 2017-06-12 DIAGNOSIS — I82509 Chronic embolism and thrombosis of unspecified deep veins of unspecified lower extremity: Secondary | ICD-10-CM

## 2017-06-12 DIAGNOSIS — Z79899 Other long term (current) drug therapy: Secondary | ICD-10-CM | POA: Insufficient documentation

## 2017-06-12 DIAGNOSIS — Z7982 Long term (current) use of aspirin: Secondary | ICD-10-CM | POA: Insufficient documentation

## 2017-06-12 DIAGNOSIS — Z9049 Acquired absence of other specified parts of digestive tract: Secondary | ICD-10-CM | POA: Insufficient documentation

## 2017-06-12 DIAGNOSIS — E119 Type 2 diabetes mellitus without complications: Secondary | ICD-10-CM | POA: Insufficient documentation

## 2017-06-12 DIAGNOSIS — Z7984 Long term (current) use of oral hypoglycemic drugs: Secondary | ICD-10-CM | POA: Insufficient documentation

## 2017-06-12 DIAGNOSIS — Z96651 Presence of right artificial knee joint: Secondary | ICD-10-CM | POA: Insufficient documentation

## 2017-06-12 DIAGNOSIS — Z79891 Long term (current) use of opiate analgesic: Secondary | ICD-10-CM | POA: Insufficient documentation

## 2017-06-12 LAB — POCT GLYCOSYLATED HEMOGLOBIN (HGB A1C): HEMOGLOBIN A1C: 6.5

## 2017-06-12 LAB — GLUCOSE, POCT (MANUAL RESULT ENTRY): POC GLUCOSE: 204 mg/dL — AB (ref 70–99)

## 2017-06-12 MED ORDER — ATORVASTATIN CALCIUM 20 MG PO TABS: 20.0000 mg | ORAL_TABLET | Freq: Every day | ORAL | 3 refills | Status: DC

## 2017-06-12 MED ORDER — FAMOTIDINE 20 MG PO TABS: 20.0000 mg | ORAL_TABLET | Freq: Every day | ORAL | 3 refills | Status: DC | PRN

## 2017-06-12 NOTE — Patient Instructions (Signed)
Diabetes Mellitus and Food It is important for you to manage your blood sugar (glucose) level. Your blood glucose level can be greatly affected by what you eat. Eating healthier foods in the appropriate amounts throughout the day at about the same time each day will help you control your blood glucose level. It can also help slow or prevent worsening of your diabetes mellitus. Healthy eating may even help you improve the level of your blood pressure and reach or maintain a healthy weight. General recommendations for healthful eating and cooking habits include:  Eating meals and snacks regularly. Avoid going long periods of time without eating to lose weight.  Eating a diet that consists mainly of plant-based foods, such as fruits, vegetables, nuts, legumes, and whole grains.  Using low-heat cooking methods, such as baking, instead of high-heat cooking methods, such as deep frying.  Work with your dietitian to make sure you understand how to use the Nutrition Facts information on food labels. How can food affect me? Carbohydrates Carbohydrates affect your blood glucose level more than any other type of food. Your dietitian will help you determine how many carbohydrates to eat at each meal and teach you how to count carbohydrates. Counting carbohydrates is important to keep your blood glucose at a healthy level, especially if you are using insulin or taking certain medicines for diabetes mellitus. Alcohol Alcohol can cause sudden decreases in blood glucose (hypoglycemia), especially if you use insulin or take certain medicines for diabetes mellitus. Hypoglycemia can be a life-threatening condition. Symptoms of hypoglycemia (sleepiness, dizziness, and disorientation) are similar to symptoms of having too much alcohol. If your health care provider has given you approval to drink alcohol, do so in moderation and use the following guidelines:  Women should not have more than one drink per day, and men  should not have more than two drinks per day. One drink is equal to: ? 12 oz of beer. ? 5 oz of wine. ? 1 oz of hard liquor.  Do not drink on an empty stomach.  Keep yourself hydrated. Have water, diet soda, or unsweetened iced tea.  Regular soda, juice, and other mixers might contain a lot of carbohydrates and should be counted.  What foods are not recommended? As you make food choices, it is important to remember that all foods are not the same. Some foods have fewer nutrients per serving than other foods, even though they might have the same number of calories or carbohydrates. It is difficult to get your body what it needs when you eat foods with fewer nutrients. Examples of foods that you should avoid that are high in calories and carbohydrates but low in nutrients include:  Trans fats (most processed foods list trans fats on the Nutrition Facts label).  Regular soda.  Juice.  Candy.  Sweets, such as cake, pie, doughnuts, and cookies.  Fried foods.  What foods can I eat? Eat nutrient-rich foods, which will nourish your body and keep you healthy. The food you should eat also will depend on several factors, including:  The calories you need.  The medicines you take.  Your weight.  Your blood glucose level.  Your blood pressure level.  Your cholesterol level.  You should eat a variety of foods, including:  Protein. ? Lean cuts of meat. ? Proteins low in saturated fats, such as fish, egg whites, and beans. Avoid processed meats.  Fruits and vegetables. ? Fruits and vegetables that may help control blood glucose levels, such as apples,   mangoes, and yams.  Dairy products. ? Choose fat-free or low-fat dairy products, such as milk, yogurt, and cheese.  Grains, bread, pasta, and rice. ? Choose whole grain products, such as multigrain bread, whole oats, and brown rice. These foods may help control blood pressure.  Fats. ? Foods containing healthful fats, such as  nuts, avocado, olive oil, canola oil, and fish.  Does everyone with diabetes mellitus have the same meal plan? Because every person with diabetes mellitus is different, there is not one meal plan that works for everyone. It is very important that you meet with a dietitian who will help you create a meal plan that is just right for you. This information is not intended to replace advice given to you by your health care provider. Make sure you discuss any questions you have with your health care provider. Document Released: 05/31/2005 Document Revised: 02/09/2016 Document Reviewed: 07/31/2013 Elsevier Interactive Patient Education  2017 [employer] Inc.  

## 2017-06-12 NOTE — Progress Notes (Signed)
Subjective:  Patient ID: David Dunlap, male    DOB: [birth year]  Age: 46 y.o. MRN: [id number]  CC: Diabetes   HPI David Dunlap is a  46 year old male with a history of type 2 diabetes mellitus (A1c 6.5) hypertension, chronic PE, depression, right total knee replacement in 04/2017 who presents today for a follow-up visit.  His Knee is doing well and he has completed physical therapy; currently ambulates with a cane and does have intermittent pain for which he takes Robaxin and Oxycodone prescribed by his orthopedic- Dr Erlinda Hong.  He takes Xarelto for chronic DVT and PE and denies any bleeding; reflux is controlled on famotidine.  He has been compliant with his diabetic medications and denies hypoglycemia, visual concerns or numbness in extremities. He tolerates his antihypertensive and denies any side effect.  He complains of snoring at night, supplements during the day and daytime headaches; he would like to be evaluated for sleep apnea.  Past Medical History:  Diagnosis Date  . Arthritis   . Depression   . Diabetes mellitus without complication ([place]) 37/6283   NEW ONSET    WITH FAMILY HISTORY  . DVT (deep venous thrombosis) ([place])   . Essential hypertension   . Gallstones 05/2016  . GERD (gastroesophageal reflux disease)     Past Surgical History:  Procedure Laterality Date  . CHOLECYSTECTOMY N/A 06/07/2016   Procedure: LAPAROSCOPIC CHOLECYSTECTOMY WITH ATTEMPTED INTRAOPERATIVE CHOLANGIOGRAM;  Surgeon: Georganna Skeans, MD;  Location: [facility];  Service: General;  Laterality: N/A;  . LIGAMENT REPAIR     R forearm  . TOTAL KNEE ARTHROPLASTY Right 04/17/2017   Procedure: RIGHT TOTAL KNEE ARTHROPLASTY;  Surgeon: Leandrew Koyanagi, MD;  Location: [facility];  Service: Orthopedics;  Laterality: Right;    No Known Allergies   Outpatient Medications Prior to Visit  Medication Sig Dispense Refill  . aspirin EC 81 MG tablet Take 81 mg by mouth daily.    . Blood Glucose Monitoring Suppl  (TRUE METRIX METER) DEVI 1 each by Does not apply route 3 (three) times daily before meals. 1 Device 0  . cetirizine (ZYRTEC) 10 MG tablet Take 1 tablet (10 mg total) by mouth daily. 30 tablet 1  . FLUoxetine (PROZAC) 20 MG tablet Take 2 tablets (40 mg total) by mouth daily. 60 tablet 5  . gabapentin (NEURONTIN) 300 MG capsule Take 1 capsule (300 mg total) by mouth 3 (three) times daily. 90 capsule 5  . glipiZIDE (GLUCOTROL) 5 MG tablet Take 1 tablet (5 mg total) by mouth 2 (two) times daily before a meal. 60 tablet 5  . glucose blood (TRUE METRIX BLOOD GLUCOSE TEST) test strip Used daily before meals 100 each 12  . hydrochlorothiazide (HYDRODIURIL) 25 MG tablet Take 1 tablet (25 mg total) by mouth daily. 30 tablet 5  . HYDROcodone-acetaminophen (NORCO) 7.5-325 MG tablet Take 1-2 tablets by mouth daily as needed for moderate pain. 40 tablet 0  . lisinopril (PRINIVIL,ZESTRIL) 10 MG tablet Take 1 tablet (10 mg total) by mouth daily. 30 tablet 5  . metFORMIN (GLUCOPHAGE) 500 MG tablet Take 2 tablets (1,000 mg total) by mouth 2 (two) times daily with a meal. 120 tablet 5  . methocarbamol (ROBAXIN) 750 MG tablet TAKE 1 TABLET BY MOUTH TWICE A DAY AS NEEDED FOR MUSCLE SPASMS 60 tablet 0  . rivaroxaban (XARELTO) 20 MG TABS tablet Take 1 tablet (20 mg total) by mouth daily with supper. 90 tablet 3  . senna-docusate (SENOKOT S) 8.6-50 MG tablet  Take 1 tablet by mouth at bedtime as needed. 30 tablet 1  . TRUEPLUS LANCETS 28G MISC Use daily before meals 30 each 12  . oxyCODONE (OXY IR/ROXICODONE) 5 MG immediate release tablet Take 1-3 tablets (5-15 mg total) by mouth every 4 (four) hours as needed. (Patient not taking: Reported on 05/14/2017) 90 tablet 0  . oxyCODONE (OXYCONTIN) 10 mg 12 hr tablet Take 1 tablet (10 mg total) by mouth every 12 (twelve) hours. (Patient not taking: Reported on 05/14/2017) 10 tablet 0  . oxyCODONE-acetaminophen (PERCOCET) 5-325 MG tablet Take 1-2 tablets by mouth 2 (two) times daily  as needed for severe pain. (Patient not taking: Reported on 05/14/2017) 40 tablet 0  . acetaminophen-codeine (TYLENOL #3) 300-30 MG tablet Take 1 tablet by mouth every 12 (twelve) hours as needed for moderate pain. (Patient not taking: Reported on 05/30/2017) 50 tablet 1  . famotidine (PEPCID) 20 MG tablet Take 1 tablet (20 mg total) by mouth daily as needed for heartburn. 30 tablet 3  . ondansetron (ZOFRAN) 4 MG tablet Take 1-2 tablets (4-8 mg total) by mouth every 8 (eight) hours as needed for nausea or vomiting. (Patient not taking: Reported on 05/14/2017) 40 tablet 0  . sulfamethoxazole-trimethoprim (BACTRIM DS,SEPTRA DS) 800-160 MG tablet Take 1 tablet by mouth 2 (two) times daily. (Patient not taking: Reported on 05/30/2017) 20 tablet 0   No facility-administered medications prior to visit.     ROS Review of Systems  Constitutional: Negative for activity change and appetite change.  HENT: Negative for sinus pressure and sore throat.   Eyes: Negative for visual disturbance.  Respiratory: Negative for cough, chest tightness and shortness of breath.   Cardiovascular: Negative for chest pain and leg swelling.  Gastrointestinal: Negative for abdominal distention, abdominal pain, constipation and diarrhea.  Endocrine: Negative.   Genitourinary: Negative for dysuria.  Musculoskeletal: Negative for joint swelling and myalgias.  Skin: Negative for rash.  Allergic/Immunologic: Negative.   Neurological: Negative for weakness, light-headedness and numbness.  Psychiatric/Behavioral: Negative for dysphoric mood and suicidal ideas.    Objective:  BP 120/83   Pulse 79   Temp 97.9 F (36.6 C) (Oral)   Wt 270 lb 9.6 oz (122.7 kg)   SpO2 97%   BMI 39.96 kg/m   BP/Weight 06/12/2017 09/22/1094 0/12/5407  Systolic BP 811 914 -  Diastolic BP 83 84 -  Wt. (Lbs) 270.6 - 282  BMI 39.96 - -     Physical Exam  Constitutional: He is oriented to person, place, and time. He appears well-developed and  well-nourished.  Cardiovascular: Normal rate, normal heart sounds and intact distal pulses.   No murmur heard. Pulmonary/Chest: Effort normal and breath sounds normal. He has no wheezes. He has no rales. He exhibits no tenderness.  Abdominal: Soft. Bowel sounds are normal. He exhibits no distension and no mass. There is no tenderness.  Musculoskeletal: Normal range of motion. He exhibits edema (slight edema of the right knee) and tenderness (Slight right knee tenderness on flexion).  Healed vertical surgical scar  Neurological: He is alert and oriented to person, place, and time.      CMP Latest Ref Rng & Units 04/18/2017 04/08/2017 11/28/2016  Glucose 65 - 99 mg/dL 195(H) 185(H) 128(H)  BUN 6 - 20 mg/dL _0 Creatinine 0.61 - 1.24 mg/dL 1.17 1.25(H) 1.39(H)  Sodium 135 - 145 mmol/L 135 134(L) 134(L)  Potassium 3.5 - 5.1 mmol/L 4.8 4.2 4.2  Chloride 101 - 111 mmol/L 103 101 98(L)  CO2 22 - 32 mmol/L _0 Calcium 8.9 - 10.3 mg/dL 8.5(L) 9.7 9.0  Total Protein 6.5 - 8.1 g/dL - 7.3 -  Total Bilirubin 0.3 - 1.2 mg/dL - 0.7 -  Alkaline Phos 38 - 126 U/L - 64 -  AST 15 - 41 U/L - 16 -  ALT 17 - 63 U/L - 47 -    . Lipid Panel     Component Value Date/Time   CHOL 182 12/08/2014 0942   TRIG 121 12/08/2014 0942   HDL 40 12/08/2014 0942   CHOLHDL 4.6 12/08/2014 0942   VLDL 24 12/08/2014 0942   LDLCALC 118 (H) 12/08/2014 0942    Lab Results  Component Value Date   HGBA1C 6.5 06/12/2017    The 10-year ASCVD risk score Mikey Bussing DC Jr., et al., 2013) is: 13.5%   Values used to calculate the score:     Age: 67 years     Sex: Male     Is Non-Hispanic African American: Yes     Diabetic: Yes     Tobacco smoker: No     Systolic Blood Pressure: 728 mmHg     Is BP treated: Yes     HDL Cholesterol: 40 mg/dL     Total Cholesterol: 182 mg/dL  Assessment & Plan:   1. Diabetes mellitus without complication ([place]) Controlled with A1c of 6.5 Continue current medications Commenced  on atorvastatin for primary prevention of cardiovascular disease - 13.5% ASCVD risk Diabetic diet, lifestyle modifications - POCT glucose (manual entry) - POCT glycosylated hemoglobin (Hb A1C) - Lipid panel  2. Gastroesophageal reflux disease without esophagitis Controlled - famotidine (PEPCID) 20 MG tablet; Take 1 tablet (20 mg total) by mouth daily as needed for heartburn.  Dispense: 30 tablet; Refill: 3  3. Suspected sleep apnea - Split night study; Future  4. Status post total right knee replacement Currently taking Robaxin and oxycodone for pain Amplitude the aid of a cane as tolerated Keep appointment with Orthopedics - Dr Erlinda Hong  5. Essential hypertension Controlled - CMP14+EGFR  6. 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 Encounter for screening for HIV - HIV antibody (with reflex)   Meds ordered this encounter  Medications  . famotidine (PEPCID) 20 MG tablet    Sig: Take 1 tablet (20 mg total) by mouth daily as needed for heartburn.    Dispense:  30 tablet    Refill:  3  . atorvastatin (LIPITOR) 20 MG tablet    Sig: Take 1 tablet (20 mg total) by mouth daily.    Dispense:  30 tablet    Refill:  3    Follow-up: Return in about 3 months (around 09/11/2017) for Follow-up on diabetes.   Arnoldo Morale MD

## 2017-06-13 ENCOUNTER — Ambulatory Visit: Payer: No Typology Code available for payment source | Admitting: Physical Therapy

## 2017-06-13 ENCOUNTER — Encounter: Payer: Self-pay | Admitting: Physical Therapy

## 2017-06-13 DIAGNOSIS — M25661 Stiffness of right knee, not elsewhere classified: Secondary | ICD-10-CM

## 2017-06-13 DIAGNOSIS — M25561 Pain in right knee: Secondary | ICD-10-CM

## 2017-06-13 DIAGNOSIS — R262 Difficulty in walking, not elsewhere classified: Secondary | ICD-10-CM

## 2017-06-13 DIAGNOSIS — M6281 Muscle weakness (generalized): Secondary | ICD-10-CM

## 2017-06-13 DIAGNOSIS — R600 Localized edema: Secondary | ICD-10-CM

## 2017-06-13 LAB — CMP14+EGFR
ALBUMIN: 4.4 g/dL (ref 3.5–5.5)
ALT: 22 IU/L (ref 0–44)
AST: 8 IU/L (ref 0–40)
Albumin/Globulin Ratio: 1.6 (ref 1.2–2.2)
Alkaline Phosphatase: 101 IU/L (ref 39–117)
BUN/Creatinine Ratio: 8 — ABNORMAL LOW (ref 9–20)
BUN: 9 mg/dL (ref 6–24)
Bilirubin Total: 0.3 mg/dL (ref 0.0–1.2)
CALCIUM: 9.3 mg/dL (ref 8.7–10.2)
CO2: 23 mmol/L (ref 20–29)
Chloride: 100 mmol/L (ref 96–106)
Creatinine, Ser: 1.07 mg/dL (ref 0.76–1.27)
GFR, EST AFRICAN AMERICAN: 94 mL/min/{1.73_m2} (ref >59)
GFR, EST NON AFRICAN AMERICAN: 82 mL/min/{1.73_m2} (ref >59)
GLOBULIN, TOTAL: 2.7 g/dL (ref 1.5–4.5)
Glucose: 163 mg/dL — ABNORMAL HIGH (ref 65–99)
Potassium: 4.3 mmol/L (ref 3.5–5.2)
Sodium: 141 mmol/L (ref 134–144)
Total Protein: 7.1 g/dL (ref 6.0–8.5)

## 2017-06-13 LAB — LIPID PANEL
CHOL/HDL RATIO: 3.7 ratio (ref 0.0–5.0)
Cholesterol, Total: 136 mg/dL (ref 100–199)
HDL: 37 mg/dL — ABNORMAL LOW (ref >39)
LDL Calculated: 77 mg/dL (ref 0–99)
Triglycerides: 111 mg/dL (ref 0–149)
VLDL CHOLESTEROL CAL: 22 mg/dL (ref 5–40)

## 2017-06-13 LAB — HIV ANTIBODY (ROUTINE TESTING W REFLEX): HIV Screen 4th Generation wRfx: NONREACTIVE

## 2017-06-13 NOTE — Therapy (Signed)
[name], [place], [address] Phone: [phone]   Fax:  [phone]  Physical Therapy Treatment  Patient Details  Name: David Dunlap MRN: [id number] Date of Birth: 1970-12-20 Referring Provider: Dr. Frankey Shown   Encounter Date: 06/13/2017      PT End of Session - 06/13/17 1029    Visit Number 7   Number of Visits 16   Date for PT Re-Evaluation 07/09/17   PT Start Time 0935   PT Stop Time 1034   PT Time Calculation (min) 59 min   Activity Tolerance Patient tolerated treatment well   Behavior During Therapy [facility] for tasks assessed/performed      Past Medical History:  Diagnosis Date  . Arthritis   . Depression   . Diabetes mellitus without complication ([place]) 33/0076   NEW ONSET    WITH FAMILY HISTORY  . DVT (deep venous thrombosis) ([place])   . Essential hypertension   . Gallstones 05/2016  . GERD (gastroesophageal reflux disease)     Past Surgical History:  Procedure Laterality Date  . CHOLECYSTECTOMY N/A 06/07/2016   Procedure: LAPAROSCOPIC CHOLECYSTECTOMY WITH ATTEMPTED INTRAOPERATIVE CHOLANGIOGRAM;  Surgeon: Georganna Skeans, MD;  Location: [facility];  Service: General;  Laterality: N/A;  . LIGAMENT REPAIR     R forearm  . TOTAL KNEE ARTHROPLASTY Right 04/17/2017   Procedure: RIGHT TOTAL KNEE ARTHROPLASTY;  Surgeon: Leandrew Koyanagi, MD;  Location: [facility];  Service: Orthopedics;  Laterality: Right;    There were no vitals filed for this visit.      Subjective Assessment - 06/13/17 0942    Subjective Pain 2-3/10.  Swelling a little better .  I walk inside without the cane            [facility] PT Assessment - 06/13/17 0001      AROM   Right Knee Flexion 110  113 PROM                      [facility] Adult PT Treatment/Exercise - 06/13/17 0001      Knee/Hip Exercises: Standing   Heel Raises 10 reps   Heel Raises Limitations unable to do single so increased weight on right with  both   SLS holding 15 seconds.   Walking with Sports Cord resisted walking 12 feet 2 X     Knee/Hip Exercises: Seated   Heel Slides 10 reps   Hamstring Curl 10 reps;2 sets  progressed to blue band,  issued for home progression     Moist Heat Therapy   Moist Heat Location Knee  10 minutes concurrent with manual.     Electrical Stimulation   Electrical Stimulation Location Rt. knee anterior   Electrical Stimulation Action IFC   Electrical Stimulation Parameters 19   Electrical Stimulation Goals Pain     Manual Therapy   Manual Therapy Joint mobilization   Manual therapy comments retrograde for soft tissue  tissue softened.  less soreness.   Joint Mobilization pattellar mobs,  joint mobs with movement for flexion,  gaioed 3 degrees PROM                  PT Short Term Goals - 06/11/17 0946      PT SHORT TERM GOAL #1   Title Pt will be I with initial HEP for knee AROM and strength.    Baseline does not do SLR and quad set, reissued.    Status On-going  PT SHORT TERM GOAL #2   Title Pt will be able to extend leg on mat to -8 deg for ROM and manual work.     Status On-going     PT SHORT TERM GOAL #3   Title Pt will be able to stand to cook for 15 min with only min pain increase from baseline.    Status Achieved           PT Long Term Goals - 06/11/17 0948      PT LONG TERM GOAL #1   Title Pt will be I with more advanced HEP and concepts of RICE to control swelling.    Status On-going     PT LONG TERM GOAL #2   Title Pt will increase strength in bilateral LE's to 4+/5 throughout knees and hips for improved functional mobility.    Baseline R.t knee 4+/5    Status Partially Met     PT LONG TERM GOAL #3   Title Pt will be able to go up and down 12 stairs with 1 rail reciprocally and safety, min pain.    Baseline pain mod or more    Status On-going     PT LONG TERM GOAL #4   Title Pt with be able to stand and walking in the community for up to 45 min  with only min pain in Rt. knee    Status On-going               Plan - 06/13/17 1029    Clinical Impression Statement 113 PROM, 110 AROM right knee.  He is unable to SLS without use of hands.     PT Next Visit Plan Manage swelling; progress exercises.  Try step ups. Repeat IFC .  Balance work   PT Home Exercise Plan quad set supine and standing, SLR, LAQ, gait total joint wall wt shifting and patellar mobs for home given   Consulted and Agree with Plan of Care Patient      Patient will benefit from skilled therapeutic intervention in order to improve the following deficits and impairments:     Visit Diagnosis: Localized edema  Muscle weakness (generalized)  Stiffness of right knee, not elsewhere classified  Acute pain of right knee  Difficulty in walking, not elsewhere classified     Problem List Patient Active Problem List   Diagnosis Date Noted  . Total knee replacement status 04/17/2017  . GERD (gastroesophageal reflux disease) 11/27/2016  . Unilateral primary osteoarthritis, right knee 10/18/2016  . Neuropathy 07/25/2016  . Acute cholecystitis 06/05/2016  . Diabetes mellitus without complication ([place]) 39/11/90  . Cholelithiasis 04/09/2016  . Major depressive disorder, single episode 04/09/2016  . Leg DVT (deep venous thromboembolism), chronic ([place]) 12/08/2014  . Family history of diabetes mellitus (DM) 12/08/2014  . DVT, femoral, acute ([place]) 03/22/2014  . Moderate major depression, single episode ([place]) 08/22/2011  . Constipation 11/22/2010  . ANKLE EDEMA 10/19/2010  . Essential hypertension 10/12/2010    [name],[name] PTA 06/13/2017, 10:32 AM  [facility] [address] [place], [place], [address] Phone: [phone]   Fax:  [phone]  Name: David Dunlap MRN: [id number] Date of Birth: 1971/07/28

## 2017-06-14 ENCOUNTER — Telehealth: Payer: Self-pay

## 2017-06-14 NOTE — Telephone Encounter (Signed)
Pt was called and informed of lab results. 

## 2017-06-19 ENCOUNTER — Ambulatory Visit: Payer: Self-pay | Attending: Orthopaedic Surgery | Admitting: Physical Therapy

## 2017-06-19 DIAGNOSIS — M25561 Pain in right knee: Secondary | ICD-10-CM | POA: Insufficient documentation

## 2017-06-19 DIAGNOSIS — R262 Difficulty in walking, not elsewhere classified: Secondary | ICD-10-CM | POA: Insufficient documentation

## 2017-06-19 DIAGNOSIS — R600 Localized edema: Secondary | ICD-10-CM | POA: Insufficient documentation

## 2017-06-19 DIAGNOSIS — M25661 Stiffness of right knee, not elsewhere classified: Secondary | ICD-10-CM | POA: Insufficient documentation

## 2017-06-19 DIAGNOSIS — M6281 Muscle weakness (generalized): Secondary | ICD-10-CM | POA: Insufficient documentation

## 2017-06-19 NOTE — Therapy (Signed)
[place], [place], [address] Phone: [phone]   Fax:  [phone]  Physical Therapy Treatment  Patient Details  Name: David Dunlap MRN: [id number] Date of Birth: March 05, 1971 Referring Provider: Dr. Frankey Shown   Encounter Date: 06/19/2017      PT End of Session - 06/19/17 1223    Visit Number 8   Number of Visits 16   Date for PT Re-Evaluation 07/09/17   PT Start Time 1017   PT Stop Time 1115   PT Time Calculation (min) 58 min   Activity Tolerance Patient tolerated treatment well   Behavior During Therapy [facility] for tasks assessed/performed      Past Medical History:  Diagnosis Date  . Arthritis   . Depression   . Diabetes mellitus without complication ([place]) 26/7124   NEW ONSET    WITH FAMILY HISTORY  . DVT (deep venous thrombosis) ([place])   . Essential hypertension   . Gallstones 05/2016  . GERD (gastroesophageal reflux disease)     Past Surgical History:  Procedure Laterality Date  . CHOLECYSTECTOMY N/A 06/07/2016   Procedure: LAPAROSCOPIC CHOLECYSTECTOMY WITH ATTEMPTED INTRAOPERATIVE CHOLANGIOGRAM;  Surgeon: Georganna Skeans, MD;  Location: [place];  Service: General;  Laterality: N/A;  . LIGAMENT REPAIR     R forearm  . TOTAL KNEE ARTHROPLASTY Right 04/17/2017   Procedure: RIGHT TOTAL KNEE ARTHROPLASTY;  Surgeon: Leandrew Koyanagi, MD;  Location: [place];  Service: Orthopedics;  Laterality: Right;    There were no vitals filed for this visit.      Subjective Assessment - 06/19/17 1024    Subjective Pain 2/10.  I want to work on the swelling.  I got along [facility] after the last session.   Currently in Pain? Yes   Pain Score 2    Pain Location Knee   Pain Orientation Right;Anterior   Pain Descriptors / Indicators Aching   Pain Type Surgical pain   Aggravating Factors  over stretching standing   Pain Relieving Factors ice                         [facility] Adult PT  Treatment/Exercise - 06/19/17 0001      Knee/Hip Exercises: Stretches   Passive Hamstring Stretch 3 reps;30 seconds     Knee/Hip Exercises: Aerobic   Recumbent Bike 6 minutes     Knee/Hip Exercises: Standing   Terminal Knee Extension Limitations 10 X ball at wall,  cued   Forward Step Up Right;1 set;10 reps;Hand Hold: 2;Step Height: 4"   Forward Step Up Limitations moderate use of hands,  terminal knee control  fair.  6 inches max use of hands.    SLS needs to use his hands     Moist Heat Therapy   Number Minutes Moist Heat 15 Minutes   Moist Heat Location Knee     Manual Therapy   Manual therapy comments retrograde and taping kinesiotex   Joint Mobilization patellar mobs   Kinesiotex Edema     Kinesiotix   Edema 2 fans medial lateral   Inhibit Muscle  proximal anterior leg  Y   Facilitate Muscle  quads,  Y                  PT Short Term Goals - 06/11/17 0946      PT SHORT TERM GOAL #1   Title Pt will be I with initial HEP for knee AROM and strength.  Baseline does not do SLR and quad set, reissued.    Status On-going     PT SHORT TERM GOAL #2   Title Pt will be able to extend leg on mat to -8 deg for ROM and manual work.     Status On-going     PT SHORT TERM GOAL #3   Title Pt will be able to stand to cook for 15 min with only min pain increase from baseline.    Status Achieved           PT Long Term Goals - 06/11/17 0948      PT LONG TERM GOAL #1   Title Pt will be I with more advanced HEP and concepts of RICE to control swelling.    Status On-going     PT LONG TERM GOAL #2   Title Pt will increase strength in bilateral LE's to 4+/5 throughout knees and hips for improved functional mobility.    Baseline R.t knee 4+/5    Status Partially Met     PT LONG TERM GOAL #3   Title Pt will be able to go up and down 12 stairs with 1 rail reciprocally and safety, min pain.    Baseline pain mod or more    Status On-going     PT LONG TERM GOAL #4    Title Pt with be able to stand and walking in the community for up to 45 min with only min pain in Rt. knee    Status On-going               Plan - 06/19/17 1224    Clinical Impression Statement edema elevated today due to increased time on her feet.   PT Treatment/Interventions ADLs/Self Care Home Management;Electrical Stimulation;Cryotherapy;Functional mobility training;Balance training;Therapeutic exercise;Therapeutic activities;DME Instruction;Stair training;Gait training;Ultrasound;Manual techniques;Passive range of motion;Taping;Vasopneumatic Device;Neuromuscular re-education;Patient/family education;Manual lymph drainage   PT Next Visit Plan Manage swelling;assess tape.  progress exercises.  Try step ups. Repeat IFC .  Balance work   PT Home Exercise Plan quad set supine and standing, SLR, LAQ, gait total joint wall wt shifting and patellar mobs for home given   Consulted and Agree with Plan of Care Patient      Patient will benefit from skilled therapeutic intervention in order to improve the following deficits and impairments:  Decreased activity tolerance, Decreased strength, Increased fascial restricitons, Impaired flexibility, Impaired UE functional use, Pain, Postural dysfunction, Obesity, Increased edema, Decreased range of motion, Decreased balance, Decreased endurance, Decreased mobility, Difficulty walking, Impaired sensation, Hypomobility  Visit Diagnosis: Localized edema  Muscle weakness (generalized)  Stiffness of right knee, not elsewhere classified  Acute pain of right knee  Difficulty in walking, not elsewhere classified     Problem List Patient Active Problem List   Diagnosis Date Noted  . Total knee replacement status 04/17/2017  . GERD (gastroesophageal reflux disease) 11/27/2016  . Unilateral primary osteoarthritis, right knee 10/18/2016  . Neuropathy 07/25/2016  . Acute cholecystitis 06/05/2016  . Diabetes mellitus without complication ([facility])  05/18/2016  . Cholelithiasis 04/09/2016  . Major depressive disorder, single episode 04/09/2016  . Leg DVT (deep venous thromboembolism), chronic ([facility]) 12/08/2014  . Family history of diabetes mellitus (DM) 12/08/2014  . DVT, femoral, acute ([facility]) 03/22/2014  . Moderate major depression, single episode ([facility]) 08/22/2011  . Constipation 11/22/2010  . ANKLE EDEMA 10/19/2010  . Essential hypertension 10/12/2010    [name],[name] PTA 06/19/2017, 12:46 PM  [facility] [address]   [place], [place], [address] Phone: [phone]   Fax:  [phone]  Name: David Dunlap MRN: [id number] Date of Birth: 1971-02-16

## 2017-06-21 ENCOUNTER — Telehealth: Payer: Self-pay | Admitting: Physical Therapy

## 2017-06-21 ENCOUNTER — Ambulatory Visit: Payer: Self-pay | Admitting: Physical Therapy

## 2017-06-21 NOTE — Telephone Encounter (Signed)
Patient was called regarding his missed appointment today.  He was at his MD office and said he forgot about his therapy appt.  He plans to come to hi next appt. Monday.  Karie Mainland, PT 06/21/17 11:16 AM Phone: [phone] Fax: [phone]

## 2017-06-24 ENCOUNTER — Ambulatory Visit: Payer: Self-pay | Admitting: Physical Therapy

## 2017-06-26 ENCOUNTER — Encounter: Payer: Self-pay | Admitting: Physical Therapy

## 2017-06-26 ENCOUNTER — Ambulatory Visit: Payer: Self-pay | Admitting: Physical Therapy

## 2017-06-26 DIAGNOSIS — R600 Localized edema: Secondary | ICD-10-CM

## 2017-06-26 DIAGNOSIS — M25561 Pain in right knee: Secondary | ICD-10-CM

## 2017-06-26 DIAGNOSIS — M25661 Stiffness of right knee, not elsewhere classified: Secondary | ICD-10-CM

## 2017-06-26 DIAGNOSIS — R262 Difficulty in walking, not elsewhere classified: Secondary | ICD-10-CM

## 2017-06-26 DIAGNOSIS — M6281 Muscle weakness (generalized): Secondary | ICD-10-CM

## 2017-06-26 NOTE — Patient Instructions (Signed)
Hamstring stretch with sheet Daily 3 x 30 seconds

## 2017-06-26 NOTE — Therapy (Signed)
[place], [place], [address] Phone: [phone]   Fax:  [phone]  Physical Therapy Treatment  Patient Details  Name: David Dunlap MRN: [id number] Date of Birth: 26-Mar-1971 Referring Provider: Dr. Frankey Shown   Encounter Date: 06/26/2017      PT End of Session - 06/26/17 1311    Visit Number 9   Number of Visits 16   Date for PT Re-Evaluation 07/09/17   PT Start Time 1021   PT Stop Time 1100   PT Time Calculation (min) 39 min   Activity Tolerance Patient tolerated treatment well   Behavior During Therapy [facility] for tasks assessed/performed      Past Medical History:  Diagnosis Date  . Arthritis   . Depression   . Diabetes mellitus without complication ([place]) 76/2263   NEW ONSET    WITH FAMILY HISTORY  . DVT (deep venous thrombosis) ([place])   . Essential hypertension   . Gallstones 05/2016  . GERD (gastroesophageal reflux disease)     Past Surgical History:  Procedure Laterality Date  . CHOLECYSTECTOMY N/A 06/07/2016   Procedure: LAPAROSCOPIC CHOLECYSTECTOMY WITH ATTEMPTED INTRAOPERATIVE CHOLANGIOGRAM;  Surgeon: Georganna Skeans, MD;  Location: [place];  Service: General;  Laterality: N/A;  . LIGAMENT REPAIR     R forearm  . TOTAL KNEE ARTHROPLASTY Right 04/17/2017   Procedure: RIGHT TOTAL KNEE ARTHROPLASTY;  Surgeon: Leandrew Koyanagi, MD;  Location: [place];  Service: Orthopedics;  Laterality: Right;    There were no vitals filed for this visit.      Subjective Assessment - 06/26/17 1255    Subjective Pain 2/10.  I think the tape helped with the swelling.  It did not stay on long.  Knee hight white TED hose today.   Currently in Pain? Yes   Pain Score 2    Pain Location Knee   Pain Orientation Right;Anterior   Pain Descriptors / Indicators Aching   Pain Frequency Constant   Aggravating Factors  over stretching,  standing longer   Pain Relieving Factors ice   Multiple Pain Sites No             [facility] PT Assessment - 06/26/17 0001      AROM   Right Knee Extension --  PROM to -6.  Hamstrings limiting                     [facility] Adult PT Treatment/Exercise - 06/26/17 0001      Ambulation/Gait   Stairs Yes   Height of Stairs 4   Pre-Gait Activities wall slides facing wall with each leg 2 sets of 10.  cued initially   Gait Comments with 2 rails patient used step to and step over step technique . mod to min use of hands for weightbearing.      High Level Balance   High Level Balance Comments walking a line, needs hands intermitantly,  forward 30 feet     Knee/Hip Exercises: Stretches   Passive Hamstring Stretch 3 reps;30 seconds   Passive Hamstring Stretch Limitations light stretches,  sensitive with      Knee/Hip Exercises: Aerobic   Recumbent Bike 6 minutes     Knee/Hip Exercises: Standing   Forward Step Up 10 reps;Hand Hold: 1;Step Height: 4"   Forward Step Up Limitations needs work with terminal knee control   SLS 5 seconds max   SLS with Vectors With green ball toss/ catch to trampoline.  Close SBA,  1 in a row his best  also with opposite hip flexon//extension/abd 10 x     Knee/Hip Exercises: Seated   Hamstring Curl 10 reps;2 sets   Hamstring Limitations Greenn band   Sit to Sand 5 reps  with 5 second pause     Knee/Hip Exercises: Supine   Patellar Mobs checked     Moist Heat Therapy   Number Minutes Moist Heat --  8,  during manual only for quads and hamstring flexibility     Manual Therapy   Manual therapy comments retrograde soft tissue work,  gentle PROM into extension   Kinesiotex Edema  2 fans medial lateral knee.  after retrograde soft tissue                 PT Education - 06/26/17 1309    Education provided Yes   Education Details gait training,  HEP-  hamstring stretches.   Person(s) Educated Patient   Methods Explanation;Verbal cues   Comprehension Verbalized understanding;Returned demonstration           PT Short Term Goals - 06/26/17 1315      PT SHORT TERM GOAL #1   Title Pt will be I with initial HEP for knee AROM and strength.    Baseline independent with exercises issued so far   Time 4   Period Weeks   Status On-going     PT SHORT TERM GOAL #2   Baseline -6 PROM   Time 4   Period Weeks   Status On-going     PT SHORT TERM GOAL #3   Title Pt will be able to stand to cook for 15 min with only min pain increase from baseline.    Time 4   Period Weeks   Status Achieved           PT Long Term Goals - 06/11/17 9924      PT LONG TERM GOAL #1   Title Pt will be I with more advanced HEP and concepts of RICE to control swelling.    Status On-going     PT LONG TERM GOAL #2   Title Pt will increase strength in bilateral LE's to 4+/5 throughout knees and hips for improved functional mobility.    Baseline R.t knee 4+/5    Status Partially Met     PT LONG TERM GOAL #3   Title Pt will be able to go up and down 12 stairs with 1 rail reciprocally and safety, min pain.    Baseline pain mod or more    Status On-going     PT LONG TERM GOAL #4   Title Pt with be able to stand and walking in the community for up to 45 min with only min pain in Rt. knee    Status On-going               Plan - 06/26/17 1311    Clinical Impression Statement Edema improving.  gait speed improving.  He is using cane in community and cane in home at times.  Patient able to ascend /deacend 4 inch steps  using step over step and 2 rails with pain 2/10.  -6 PROM knee extension.   PT Treatment/Interventions ADLs/Self Care Home Management;Electrical Stimulation;Cryotherapy;Functional mobility training;Balance training;Therapeutic exercise;Therapeutic activities;DME Instruction;Stair training;Gait training;Ultrasound;Manual techniques;Passive range of motion;Taping;Vasopneumatic Device;Neuromuscular re-education;Patient/family education;Manual lymph drainage   PT Next Visit Plan FOTO next visit.  Manage swelling;PRN  tape.  progress exercises.  Try step ups with 6 inches .  Balance work,  terminal knee control.   PT Home Exercise Plan quad set supine and standing, SLR, LAQ, gait total joint wall wt shifting and patellar mobs for home given   Consulted and Agree with Plan of Care Patient      Patient will benefit from skilled therapeutic intervention in order to improve the following deficits and impairments:  Decreased activity tolerance, Decreased strength, Increased fascial restricitons, Impaired flexibility, Impaired UE functional use, Pain, Postural dysfunction, Obesity, Increased edema, Decreased range of motion, Decreased balance, Decreased endurance, Decreased mobility, Difficulty walking, Impaired sensation, Hypomobility  Visit Diagnosis: Localized edema  Muscle weakness (generalized)  Stiffness of right knee, not elsewhere classified  Acute pain of right knee  Difficulty in walking, not elsewhere classified     Problem List Patient Active Problem List   Diagnosis Date Noted  . Total knee replacement status 04/17/2017  . GERD (gastroesophageal reflux disease) 11/27/2016  . Unilateral primary osteoarthritis, right knee 10/18/2016  . Neuropathy 07/25/2016  . Acute cholecystitis 06/05/2016  . Diabetes mellitus without complication ([place]) 61/60/7371  . Cholelithiasis 04/09/2016  . Major depressive disorder, single episode 04/09/2016  . Leg DVT (deep venous thromboembolism), chronic ([place]) 12/08/2014  . Family history of diabetes mellitus (DM) 12/08/2014  . DVT, femoral, acute ([place]) 03/22/2014  . Moderate major depression, single episode ([place]) 08/22/2011  . Constipation 11/22/2010  . ANKLE EDEMA 10/19/2010  . Essential hypertension 10/12/2010    Lucella Pommier PTA 06/26/2017, 1:19 PM  [facility] [address] [place], [place], [address] Phone: [phone]   Fax:  [phone]  Name: David Dunlap MRN:  [id number] Date of Birth: Nov 30, 1970

## 2017-07-01 ENCOUNTER — Ambulatory Visit: Payer: Self-pay | Admitting: Physical Therapy

## 2017-07-01 ENCOUNTER — Encounter: Payer: Self-pay | Admitting: Physical Therapy

## 2017-07-01 DIAGNOSIS — R262 Difficulty in walking, not elsewhere classified: Secondary | ICD-10-CM

## 2017-07-01 DIAGNOSIS — R600 Localized edema: Secondary | ICD-10-CM

## 2017-07-01 DIAGNOSIS — M6281 Muscle weakness (generalized): Secondary | ICD-10-CM

## 2017-07-01 DIAGNOSIS — M25661 Stiffness of right knee, not elsewhere classified: Secondary | ICD-10-CM

## 2017-07-01 DIAGNOSIS — M25561 Pain in right knee: Secondary | ICD-10-CM

## 2017-07-01 NOTE — Therapy (Signed)
[place], [place], [address] Phone: [phone]   Fax:  [phone]  Physical Therapy Treatment  Patient Details  Name: David Dunlap MRN: [id number] Date of Birth: [birth year] Referring Provider: Dr. Frankey Shown   Encounter Date: 07/01/2017      PT End of Session - 07/01/17 1036    Visit Number 10   Number of Visits 16   Date for PT Re-Evaluation 07/09/17   PT Start Time 0935   PT Stop Time 0488   PT Time Calculation (min) 39 min   Activity Tolerance Patient tolerated treatment well   Behavior During Therapy [facility] for tasks assessed/performed      Past Medical History:  Diagnosis Date  . Arthritis   . Depression   . Diabetes mellitus without complication ([place]) 89/1694   NEW ONSET    WITH FAMILY HISTORY  . DVT (deep venous thrombosis) ([place])   . Essential hypertension   . Gallstones 05/2016  . GERD (gastroesophageal reflux disease)     Past Surgical History:  Procedure Laterality Date  . CHOLECYSTECTOMY N/A 06/07/2016   Procedure: LAPAROSCOPIC CHOLECYSTECTOMY WITH ATTEMPTED INTRAOPERATIVE CHOLANGIOGRAM;  Surgeon: Georganna Skeans, MD;  Location: [place];  Service: General;  Laterality: N/A;  . LIGAMENT REPAIR     R forearm  . TOTAL KNEE ARTHROPLASTY Right 04/17/2017   Procedure: RIGHT TOTAL KNEE ARTHROPLASTY;  Surgeon: Leandrew Koyanagi, MD;  Location: [place];  Service: Orthopedics;  Laterality: Right;    There were no vitals filed for this visit.      Subjective Assessment - 07/01/17 0954    Subjective 5.5 / 10 pain.  Pain is constant but not as bad.  Using cane in community.   Currently in Pain? Yes   Pain Score 6   5.5/10   Pain Location Knee   Pain Orientation Right;Medial;Lateral   Pain Descriptors / Indicators Tender   Pain Type Surgical pain   Pain Frequency Constant   Aggravating Factors  over stretching,  longer standing,  can't get comfortable at night,  moves from couch to bed.     Pain Relieving Factors ice,  change of position   Multiple Pain Sites --  yesterday right side was hurting, woke up that way , from the way he was laying            [facility] PT Assessment - 07/01/17 0001      Observation/Other Assessments   Focus on Therapeutic Outcomes (FOTO)  34% limitation                     [facility] Adult PT Treatment/Exercise - 07/01/17 0001      Knee/Hip Exercises: Aerobic   Recumbent Bike 6 minutes  level 2     Knee/Hip Exercises: Standing   Heel Raises 10 reps   Heel Raises Limitations also tip toe walking 15 feet   Forward Step Up Right;1 set;10 reps;Hand Hold: 2;Step Height: 6";Other (comment)   Forward Step Up Limitations he was able to do a few steps with no hands.   Functional Squat 10 reps   Functional Squat Limitations moderate cues for knee over 2nd toe   SLS 3 seconds max right and left   SLS with Vectors SLS right with left leg step forward / behind 10 x,  cued to keep knee flexed right,  anle to do with min to no use of hands.     Knee/Hip Exercises: Seated   Sit  to [facility] 10 reps  cued for equal shift,  improving,  no need forhands today.     Knee/Hip Exercises: Supine   Quad Sets 5 reps     Manual Therapy   Manual therapy comments patellar mobs medila lateral,  creeping mobilization,  ROM improved,  concurrent with moist heat   Kinesiotex --  X pattern,   75% middle 1/3 to create space medial/lateral                   PT Short Term Goals - 07/01/17 1035      PT SHORT TERM GOAL #1   Title Pt will be I with initial HEP for knee AROM and strength.    Baseline independent  (07/01/2017)   Time 4   Period Weeks   Status Achieved     PT SHORT TERM GOAL #2   Title Pt will be able to extend leg on mat to -8 deg for ROM and manual work.     Baseline -8 ( 07/01/2017)   Time 4   Period Weeks   Status Achieved     PT SHORT TERM GOAL #3   Title Pt will be able to stand to cook for 15 min with only min pain  increase from baseline.    Time 4   Period Weeks   Status Unable to assess           PT Long Term Goals - 06/11/17 0948      PT LONG TERM GOAL #1   Title Pt will be I with more advanced HEP and concepts of RICE to control swelling.    Status On-going     PT LONG TERM GOAL #2   Title Pt will increase strength in bilateral LE's to 4+/5 throughout knees and hips for improved functional mobility.    Baseline R.t knee 4+/5    Status Partially Met     PT LONG TERM GOAL #3   Title Pt will be able to go up and down 12 stairs with 1 rail reciprocally and safety, min pain.    Baseline pain mod or more    Status On-going     PT LONG TERM GOAL #4   Title Pt with be able to stand and walking in the community for up to 45 min with only min pain in Rt. knee    Status On-going               Plan - 07/01/17 1039    Clinical Impression Statement STG#1, #2 met.  FOTO 34% limitation (10th visit).  Functional strength improving.  Uses cane in community.    PT Next Visit Plan  Manage swelling;PRN  tape.  progress exercises.  continue step ups with 6 inches .  Balance work,  terminal knee control.  MD note.   PT Home Exercise Plan quad set supine and standing, SLR, LAQ, gait total joint wall wt shifting and patellar mobs for home given   Consulted and Agree with Plan of Care Patient      Patient will benefit from skilled therapeutic intervention in order to improve the following deficits and impairments:     Visit Diagnosis: Localized edema  Muscle weakness (generalized)  Stiffness of right knee, not elsewhere classified  Acute pain of right knee  Difficulty in walking, not elsewhere classified     Problem List Patient Active Problem List   Diagnosis Date Noted  . Total knee replacement status 04/17/2017  . GERD (  gastroesophageal reflux disease) 11/27/2016  . Unilateral primary osteoarthritis, right knee 10/18/2016  . Neuropathy 07/25/2016  . Acute cholecystitis  06/05/2016  . Diabetes mellitus without complication ([place]) 79/15/0569  . Cholelithiasis 04/09/2016  . Major depressive disorder, single episode 04/09/2016  . Leg DVT (deep venous thromboembolism), chronic ([place]) 12/08/2014  . Family history of diabetes mellitus (DM) 12/08/2014  . DVT, femoral, acute ([place]) 03/22/2014  . Moderate major depression, single episode ([place]) 08/22/2011  . Constipation 11/22/2010  . ANKLE EDEMA 10/19/2010  . Essential hypertension 10/12/2010    [name],[name] PTA 07/01/2017, 10:41 AM  [facility] [address] [place], [place], [address] Phone: [phone]   Fax:  [phone]  Name: David Dunlap MRN: [id number] Date of Birth: October 04, 1970

## 2017-07-03 ENCOUNTER — Ambulatory Visit: Payer: Self-pay | Admitting: Physical Therapy

## 2017-07-03 ENCOUNTER — Encounter: Payer: Self-pay | Admitting: Physical Therapy

## 2017-07-03 DIAGNOSIS — M25661 Stiffness of right knee, not elsewhere classified: Secondary | ICD-10-CM

## 2017-07-03 DIAGNOSIS — M6281 Muscle weakness (generalized): Secondary | ICD-10-CM

## 2017-07-03 DIAGNOSIS — M25561 Pain in right knee: Secondary | ICD-10-CM

## 2017-07-03 DIAGNOSIS — R262 Difficulty in walking, not elsewhere classified: Secondary | ICD-10-CM

## 2017-07-03 DIAGNOSIS — R600 Localized edema: Secondary | ICD-10-CM

## 2017-07-03 NOTE — Therapy (Signed)
[place], [place], [address] Phone: [phone]   Fax:  [phone]  Physical Therapy Treatment  Patient Details  Name: David Dunlap MRN: [id number] Date of Birth: [birth year] Referring Provider: Dr. Frankey Shown   Encounter Date: 07/03/2017      PT End of Session - 07/03/17 1237    Visit Number 11   Number of Visits 16   Date for PT Re-Evaluation 07/09/17   PT Start Time 1017   PT Stop Time 1114   PT Time Calculation (min) 57 min   Activity Tolerance Patient tolerated treatment well   Behavior During Therapy [name] for tasks assessed/performed      Past Medical History:  Diagnosis Date  . Arthritis   . Depression   . Diabetes mellitus without complication ([place]) 78/6767   NEW ONSET    WITH FAMILY HISTORY  . DVT (deep venous thrombosis) ([place])   . Essential hypertension   . Gallstones 05/2016  . GERD (gastroesophageal reflux disease)     Past Surgical History:  Procedure Laterality Date  . CHOLECYSTECTOMY N/A 06/07/2016   Procedure: LAPAROSCOPIC CHOLECYSTECTOMY WITH ATTEMPTED INTRAOPERATIVE CHOLANGIOGRAM;  Surgeon: Georganna Skeans, MD;  Location: [place];  Service: General;  Laterality: N/A;  . LIGAMENT REPAIR     R forearm  . TOTAL KNEE ARTHROPLASTY Right 04/17/2017   Procedure: RIGHT TOTAL KNEE ARTHROPLASTY;  Surgeon: Leandrew Koyanagi, MD;  Location: [place];  Service: Orthopedics;  Laterality: Right;    There were no vitals filed for this visit.      Subjective Assessment - 07/03/17 1216    Subjective 6.5/10 .  medial lateral knee.   Currently in Pain? Yes   Pain Score 6   6.5/10   Pain Location Knee   Pain Orientation Right;Medial;Lateral   Pain Descriptors / Indicators Tender   Pain Frequency Constant   Aggravating Factors  over stretching,  longer standing   Pain Relieving Factors ice ,  change of position   Multiple Pain Sites --  left knee needs replacing too.  6/10                          [facility] Adult PT Treatment/Exercise - 07/03/17 0001      Ambulation/Gait   Stairs Yes   Height of Stairs 12   Pre-Gait Activities wall slides facing wall 10 x each both and single leg.  Single leg difficult, CGA for balance.     Knee/Hip Exercises: Aerobic   Recumbent Bike 6 minutes  level 2     Knee/Hip Exercises: Standing   Heel Raises 10 reps   Heel Raises Limitations heel and toe lifts alternating,  hurting " good " knee   Lateral Step Up Right;1 set;10 reps;Hand Hold: 1;Step Height: 4"   Lateral Step Up Limitations difficult, let him do it his way vs cue for foot position.     Step Down Right;1 set;Hand Hold: 1;Step Height: 4"   Step Down Limitations 10 x   Functional Squat 5 reps   Functional Squat Limitations 10 second holds     Knee/Hip Exercises: Seated   Long Arc Quad 10 reps   Long Arc Quad Weight 5 lbs.   Long [employer] Limitations 5 second holds.   Hamstring Curl 10 reps;2 sets   Hamstring Limitations Blue band     Moist Heat Therapy   Number Minutes Moist Heat 10 Minutes   Moist Heat Location  Knee  posterior knee, extra layer, with cold pack anterior knee     Manual Therapy   Manual therapy comments patellar mobs medila lateral,  creeping mobilization,  noticed skin cooler post manual,  vs warm.  patellar mobs improved ROM   Joint Mobilization tibia P/A glides with distraction/ flexion 5 X right knee,  ROM improved.                PT Education - 07/03/17 1237    Education provided Yes   Education Details gait training   Person(s) Educated Patient   Methods Explanation   Comprehension Verbalized understanding          PT Short Term Goals - 07/01/17 1035      PT SHORT TERM GOAL #1   Title Pt will be I with initial HEP for knee AROM and strength.    Baseline independent  (07/01/2017)   Time 4   Period Weeks   Status Achieved     PT SHORT TERM GOAL #2   Title Pt will be able to extend leg on mat to -8  deg for ROM and manual work.     Baseline -8 ( 07/01/2017)   Time 4   Period Weeks   Status Achieved     PT SHORT TERM GOAL #3   Title Pt will be able to stand to cook for 15 min with only min pain increase from baseline.    Time 4   Period Weeks   Status Unable to assess           PT Long Term Goals - 06/11/17 0948      PT LONG TERM GOAL #1   Title Pt will be I with more advanced HEP and concepts of RICE to control swelling.    Status On-going     PT LONG TERM GOAL #2   Title Pt will increase strength in bilateral LE's to 4+/5 throughout knees and hips for improved functional mobility.    Baseline R.t knee 4+/5    Status Partially Met     PT LONG TERM GOAL #3   Title Pt will be able to go up and down 12 stairs with 1 rail reciprocally and safety, min pain.    Baseline pain mod or more    Status On-going     PT LONG TERM GOAL #4   Title Pt with be able to stand and walking in the community for up to 45 min with only min pain in Rt. knee    Status On-going               Plan - 07/03/17 1238    Clinical Impression Statement Parient continues to work on strengthening,  eccentric work and balance are challanging.  Sore left knee ( good knee)  limit exercises a little   PT Treatment/Interventions ADLs/Self Care Home Management;Electrical Stimulation;Cryotherapy;Functional mobility training;Balance training;Therapeutic exercise;Therapeutic activities;DME Instruction;Stair training;Gait training;Ultrasound;Manual techniques;Passive range of motion;Taping;Vasopneumatic Device;Neuromuscular re-education;Patient/family education;Manual lymph drainage   PT Next Visit Plan  Manage swelling;PRN  tape.  progress exercises.  continue step ups with 6 inches .  Balance work,  terminal knee control. Eccentric.  Check specific goals.   PT Home Exercise Plan quad set supine and standing, SLR, LAQ, gait total joint wall wt shifting and patellar mobs for home given   Consulted and Agree  with Plan of Care Patient      Patient will benefit from skilled therapeutic intervention in order to improve the following  deficits and impairments:     Visit Diagnosis: Localized edema  Muscle weakness (generalized)  Stiffness of right knee, not elsewhere classified  Acute pain of right knee  Difficulty in walking, not elsewhere classified     Problem List Patient Active Problem List   Diagnosis Date Noted  . Total knee replacement status 04/17/2017  . GERD (gastroesophageal reflux disease) 11/27/2016  . Unilateral primary osteoarthritis, right knee 10/18/2016  . Neuropathy 07/25/2016  . Acute cholecystitis 06/05/2016  . Diabetes mellitus without complication ([place]) 59/74/1638  . Cholelithiasis 04/09/2016  . Major depressive disorder, single episode 04/09/2016  . Leg DVT (deep venous thromboembolism), chronic ([place]) 12/08/2014  . Family history of diabetes mellitus (DM) 12/08/2014  . DVT, femoral, acute ([place]) 03/22/2014  . Moderate major depression, single episode ([place]) 08/22/2011  . Constipation 11/22/2010  . ANKLE EDEMA 10/19/2010  . Essential hypertension 10/12/2010    Macil Crady PTA 07/03/2017, 12:42 PM  [place], [place], [address] Phone: [phone]   Fax:  [phone]  Name: David Dunlap MRN: [id number] Date of Birth: Dec 06, 1970

## 2017-07-05 ENCOUNTER — Ambulatory Visit (INDEPENDENT_AMBULATORY_CARE_PROVIDER_SITE_OTHER): Payer: Self-pay | Admitting: Orthopaedic Surgery

## 2017-07-05 DIAGNOSIS — M1711 Unilateral primary osteoarthritis, right knee: Secondary | ICD-10-CM

## 2017-07-05 MED ORDER — HYDROCODONE-ACETAMINOPHEN 5-325 MG PO TABS
1.0000 | ORAL_TABLET | Freq: Every day | ORAL | 0 refills | Status: DC | PRN
Start: 2017-07-05 — End: 2017-10-10

## 2017-07-05 NOTE — Progress Notes (Signed)
Patient is almost 3 months status post right total knee replacement.  He is overall doing well.  He is happy with his progress.  He takes an occasional hydrocodone for pain.  He has been fairly active.  Denies any calf pain.  Physical exam shows a fully healed surgical scar.  He has excellent range of motion.  Collaterals are stable.  Patient is doing well from my standpoint.  Continue to increase activity as tolerated.  Norco refilled today.  I reminded him of dental prophylaxis.  Follow-up in 3 months with three-view x-rays of the right knee.

## 2017-07-08 ENCOUNTER — Ambulatory Visit: Payer: Self-pay | Admitting: Physical Therapy

## 2017-07-08 DIAGNOSIS — M25561 Pain in right knee: Secondary | ICD-10-CM

## 2017-07-08 DIAGNOSIS — M25661 Stiffness of right knee, not elsewhere classified: Secondary | ICD-10-CM

## 2017-07-08 DIAGNOSIS — M6281 Muscle weakness (generalized): Secondary | ICD-10-CM

## 2017-07-08 DIAGNOSIS — R262 Difficulty in walking, not elsewhere classified: Secondary | ICD-10-CM

## 2017-07-08 DIAGNOSIS — R600 Localized edema: Secondary | ICD-10-CM

## 2017-07-08 NOTE — Therapy (Signed)
[place], [place], [address] Phone: [phone]   Fax:  [phone]  Physical Therapy Treatment and Discharge   Patient Details  Name: David Dunlap MRN: [id number] Date of Birth: 1970-12-28 Referring Provider: Dr. Frankey Shown   Encounter Date: 07/08/2017      PT End of Session - 07/08/17 1022    Visit Number 12   Number of Visits 16   Date for PT Re-Evaluation 07/09/17   PT Start Time 1020   PT Stop Time 1108   PT Time Calculation (min) 48 min   Activity Tolerance Patient tolerated treatment well   Behavior During Therapy [facility] for tasks assessed/performed      Past Medical History:  Diagnosis Date  . Arthritis   . Depression   . Diabetes mellitus without complication ([place]) 15/1761   NEW O[place]ET    WITH FAMILY HISTORY  . DVT (deep venous thrombosis) ([place])   . Essential hypertension   . Gallstones 05/2016  . GERD (gastroesophageal reflux disease)     Past Surgical History:  Procedure Laterality Date  . CHOLECYSTECTOMY N/A 06/07/2016   Procedure: LAPAROSCOPIC CHOLECYSTECTOMY WITH ATTEMPTED INTRAOPERATIVE CHOLANGIOGRAM;  Surgeon: Georganna Skeans, MD;  Location: [place];  Service: General;  Laterality: N/A;  . LIGAMENT REPAIR     R forearm  . TOTAL KNEE ARTHROPLASTY Right 04/17/2017   Procedure: RIGHT TOTAL KNEE ARTHROPLASTY;  Surgeon: Leandrew Koyanagi, MD;  Location: [place];  Service: Orthopedics;  Laterality: Right;    There were no vitals filed for this visit.      Subjective Assessment - 07/08/17 1026    Subjective I'm in alot of pain due to the weather change.  Its about a 3/10 right now.  He states he can get around his house OK.    How long can you walk comfortably? varies, walks to the busstop and pain stays stable.    Currently in Pain? Yes   Pain Score 3    Pain Location Knee   Pain Orientation Right;Medial   Pain Descriptors / Indicators Sore;Pins and needles;Sharp   Pain Type  Surgical pain;Chronic pain   Pain Onset More than a month ago   Pain Frequency Intermittent   Aggravating Factors  extended periods of standing    Pain Relieving Factors ice, change position             [facility] PT Assessment - 07/08/17 0001      Circumferential Edema   Circumferential - Right 16 inch    Circumferential - Left  17 5 inch      Strength   Right Knee Flexion 5/5   Right Knee Extension 5/5            [facility] Adult PT Treatment/Exercise - 07/08/17 0001      Self-Care   Other Self-Care Comments  DC plan, HEP guidance and progress      Knee/Hip Exercises: Aerobic   Nustep L5 UE and LE for 6 min      Knee/Hip Exercises: Seated   Long Arc Quad 10 reps   Long Arc Quad Limitations blue band   Hamstring Curl 10 reps;2 sets   Hamstring Limitations Blue band     Knee/Hip Exercises: Supine   Quad Sets 5 reps   Bridges with [employer] Strengthening;Both;1 set   Straight Leg Raises Strengthening;Both;1 set;10 reps   Patellar Mobs gentle      Cryotherapy   Number Minutes Cryotherapy 10 Minutes  Cryotherapy Location Knee   Type of Cryotherapy Ice pack     Manual Therapy   Manual Therapy Soft tissue mobilization;Other (comment)   Manual therapy comments retrograde massage   Soft tissue mobilization lateral quads, ITB, scar massage            PT Education - 07/08/17 1523    Education provided Yes   Education Details HEP, progress, DC , swelling    Person(s) Educated Patient   Methods Explanation;Handout;Verbal cues   Comprehension Verbalized understanding;Returned demonstration          PT Short Term Goals - 07/08/17 1524      PT SHORT TERM GOAL #1   Title Pt will be I with initial HEP for knee AROM and strength.    Status Achieved     PT SHORT TERM GOAL #2   Title Pt will be able to extend leg on mat to -8 deg for ROM and manual work.     Status Achieved     PT SHORT TERM GOAL #3   Title Pt will be able to stand to cook for 15 min with only  min pain increase from baseline.    Status Achieved           PT Long Term Goals - 07/08/17 1050      PT LONG TERM GOAL #1   Title Pt will be I with more advanced HEP and concepts of RICE to control swelling.    Baseline ices and props knee everyday    Status Achieved     PT LONG TERM GOAL #2   Title Pt will increase strength in bilateral LE's to 4+/5 throughout knees and hips for improved functional mobility.    Baseline 5/5 knees , hips 4/5   Status Partially Met     PT LONG TERM GOAL #3   Title Pt will be able to go up and down 12 stairs with 1 rail reciprocally and safety, min pain.    Status Achieved     PT LONG TERM GOAL #4   Title Pt with be able to stand and walking in the community for up to 45 min with only min pain in Rt. knee    Status Achieved               Plan - 07/08/17 1525    Clinical Impression Statement Patient feels as though he can do what he needs to do in his home.  Rates pain as low today but said it was really hurting him.  He knows it will take time for swelling and stiffness to completely resolve.  Knee strength is 5/5 , good AROM.  Agrees to DC.    PT Next Visit Plan NA    PT Home Exercise Plan quad set supine and standing, SLR, LAQ, gait total joint wall wt shifting and patellar mobs for home given   Consulted and Agree with Plan of Care Patient      Patient will benefit from skilled therapeutic intervention in order to improve the following deficits and impairments:  Decreased activity tolerance, Decreased strength, Increased fascial restricitons, Impaired flexibility, Impaired UE functional use, Pain, Postural dysfunction, Obesity, Increased edema, Decreased range of motion, Decreased balance, Decreased endurance, Decreased mobility, Difficulty walking, Impaired sensation, Hypomobility  Visit Diagnosis: Localized edema  Muscle weakness (generalized)  Stiffness of right knee, not elsewhere classified  Acute pain of right  knee  Difficulty in walking, not elsewhere classified     Problem List  Patient Active Problem List   Diagnosis Date Noted  . Total knee replacement status 04/17/2017  . GERD (gastroesophageal reflux disease) 11/27/2016  . Unilateral primary osteoarthritis, right knee 10/18/2016  . Neuropathy 07/25/2016  . Acute cholecystitis 06/05/2016  . Diabetes mellitus without complication ([place]) 32/76/1470  . Cholelithiasis 04/09/2016  . Major depressive disorder, single episode 04/09/2016  . Leg DVT (deep venous thromboembolism), chronic ([place]) 12/08/2014  . Family history of diabetes mellitus (DM) 12/08/2014  . DVT, femoral, acute ([place]) 03/22/2014  . Moderate major depression, single episode ([place]) 08/22/2011  . Constipation 11/22/2010  . ANKLE EDEMA 10/19/2010  . Essential hypertension 10/12/2010    Ashaad Gaertner 07/08/2017, 3:32 PM  [facility] [address] [place], [place], [address] Phone: [phone]   Fax:  [phone]  Name: David Dunlap MRN: [id number] Date of Birth: 08-12-1971  PHYSICAL THERAPY DISCHARGE SUMMARY  Visits from Start of Care: 12  Current functional level related to goals / functional outcomes: See above    Remaining deficits: Stiffness, swelling Antalgic gait   Education / Equipment: HEP, core strength, cane, gait , RICE  Plan: Patient agrees to discharge.  Patient goals were partially met. Patient is being discharged due to being pleased with the current functional level.  ?????    Raeford Razor, PT 07/08/17 3:33 PM Phone: [phone] Fax: [phone]

## 2017-07-08 NOTE — Patient Instructions (Signed)
FLEXION: Sitting - Resistance Band (Active)    Sit with right leg extended. Against yellow resistance band, bend knee and draw foot backward. Complete __2_ sets of _10-20__ repetitions. Perform __2_ sessions per day.  http://gtsc.exer.us/230   Copyright  VHI. All rights reserved.   Bridging    Slowly raise buttocks from floor, keeping stomach tight. Repeat __10__ times per set. Do ___2_ sets per session. Do __2__ sessions per day.  http://orth.exer.us/1096   Copyright  VHI. All rights reserved.

## 2017-07-19 IMAGING — US US ABDOMEN LIMITED
1 series · 14 of 25 positions shown · non-contrast
Comparison: 03/30/2016

CLINICAL DATA: Right upper quadrant pain

EXAM:
US ABDOMEN LIMITED - RIGHT UPPER QUADRANT

[Series 1: us abdomen limited · 0.28mm/px · 14 of 25 slices shown]
[im 1/25]
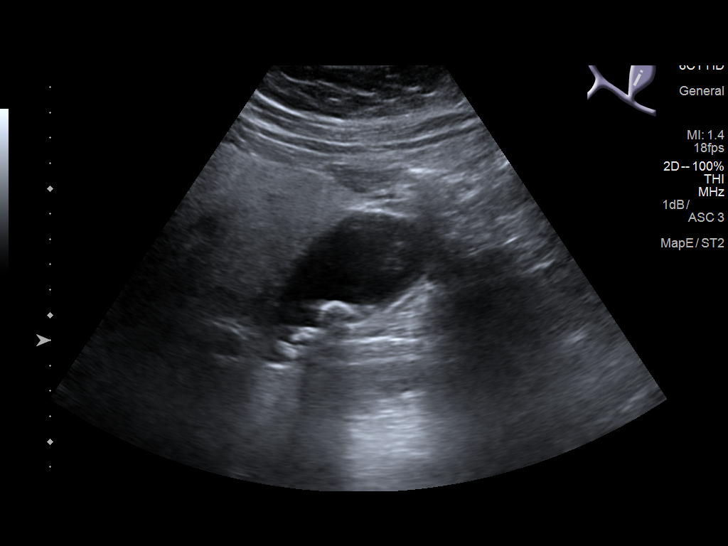
[im 3/25]
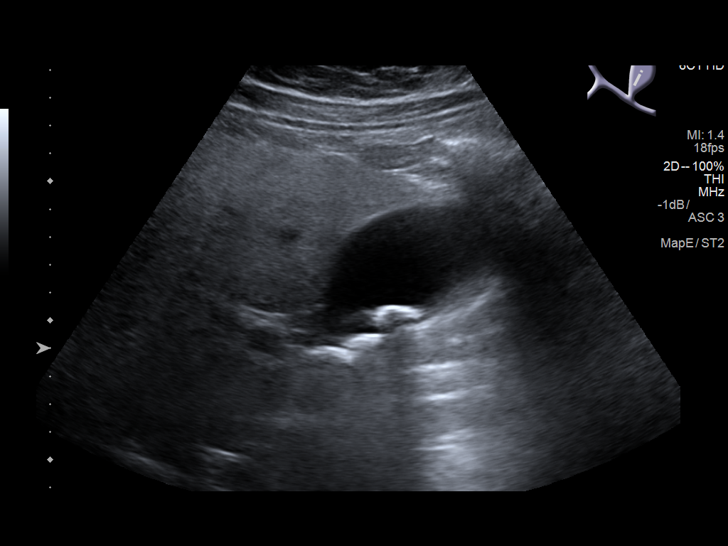
[im 5/25]
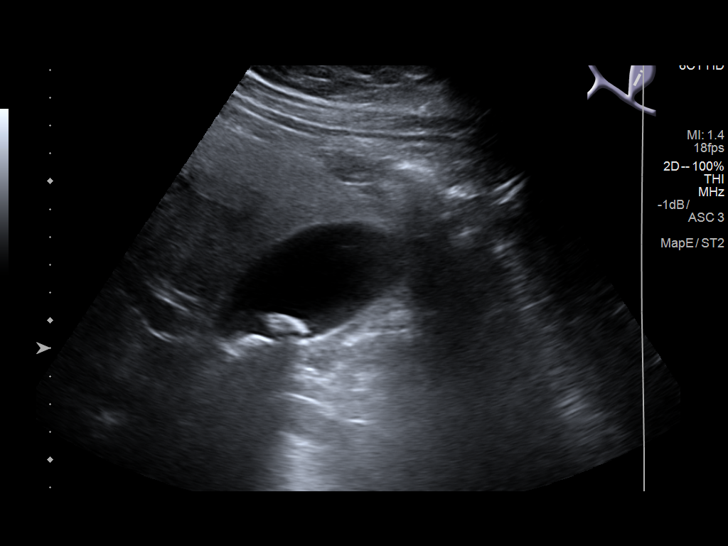
[im 7/25]
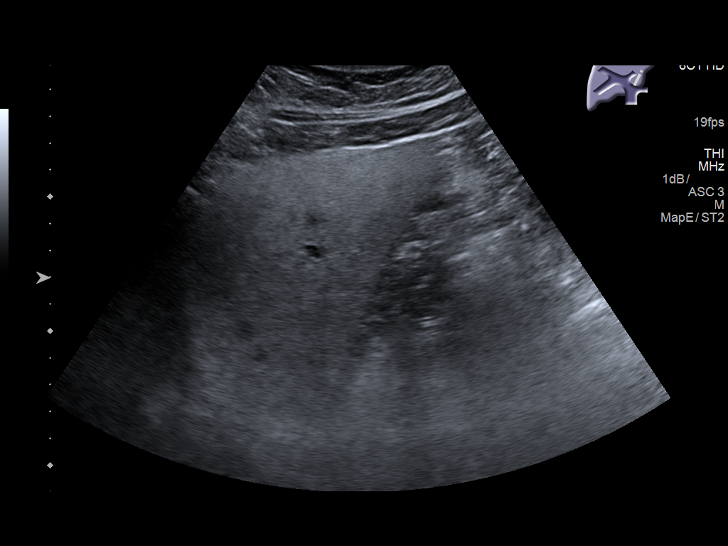
[im 9/25]
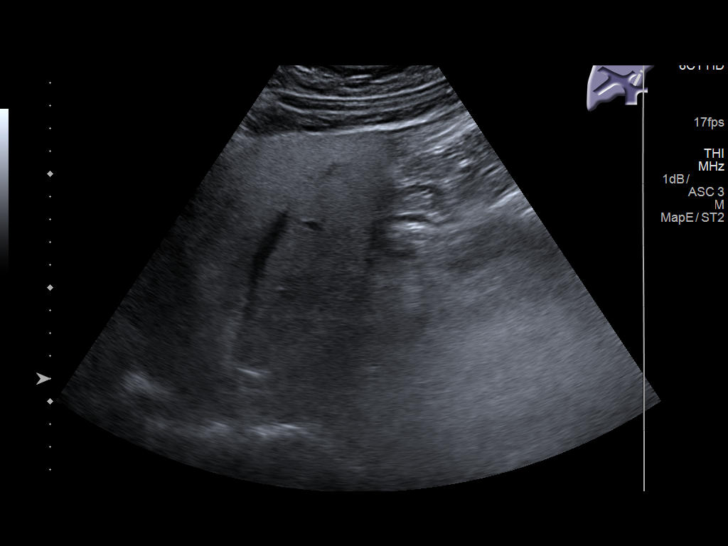
[im 10/25]
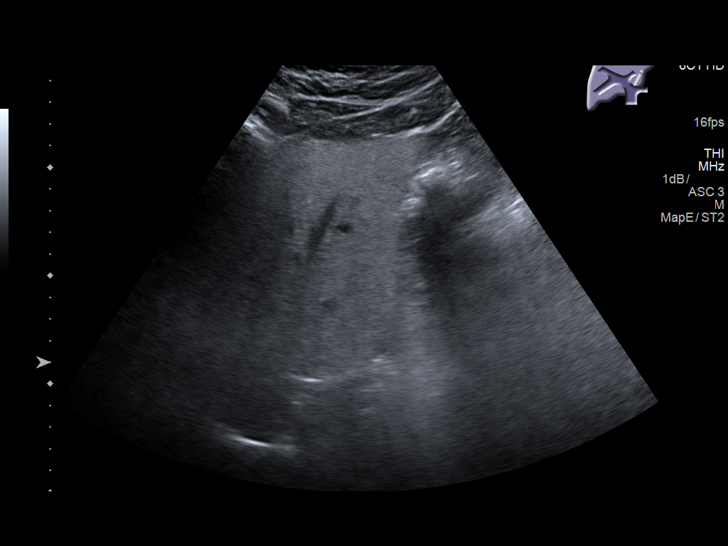
[im 12/25]
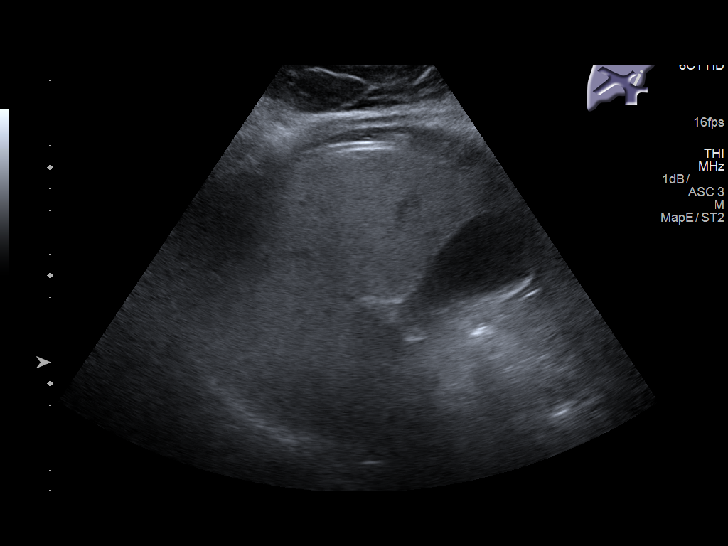
[im 14/25]
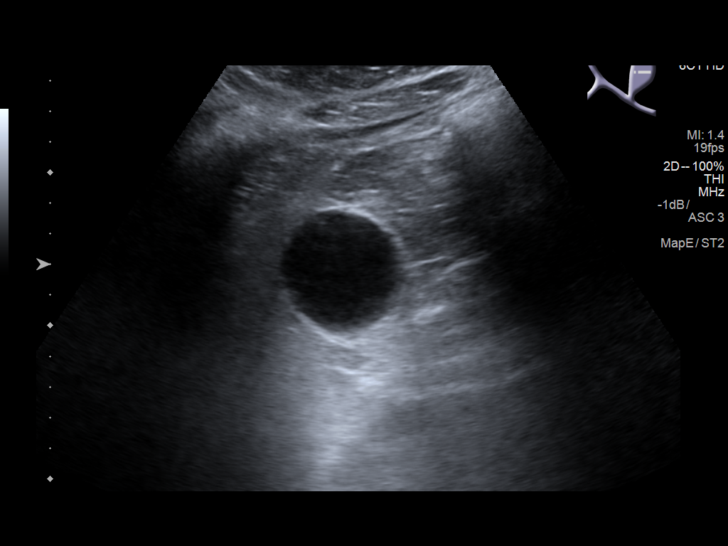
[im 16/25]
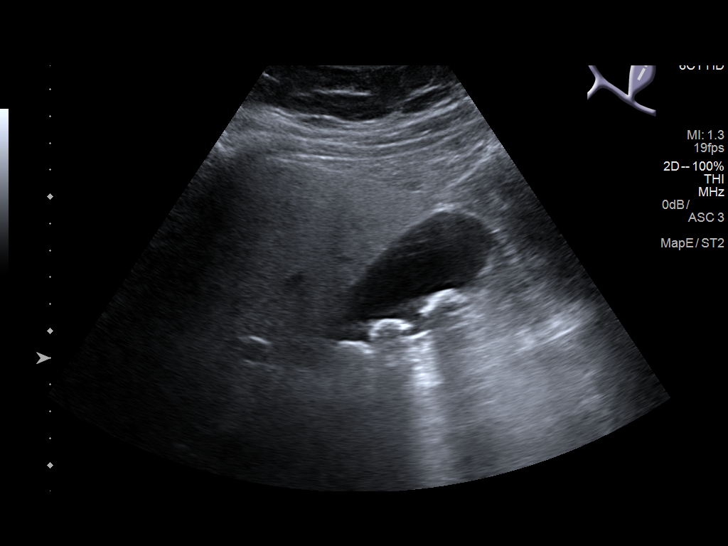
[im 17/25]
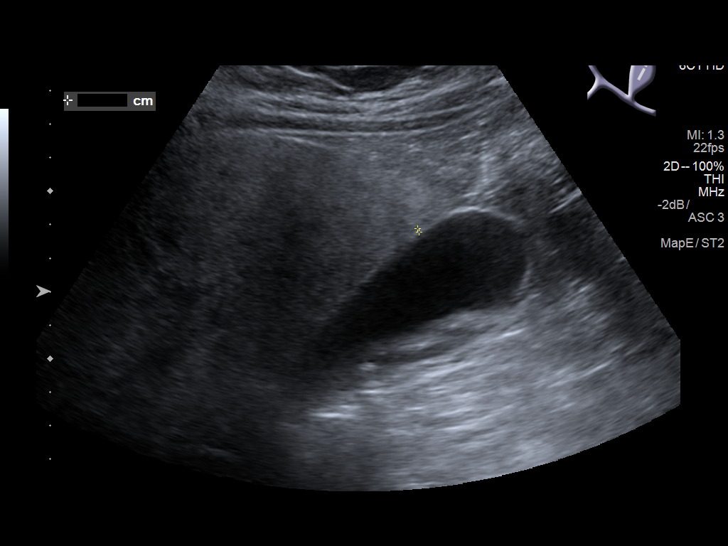
[im 19/25]
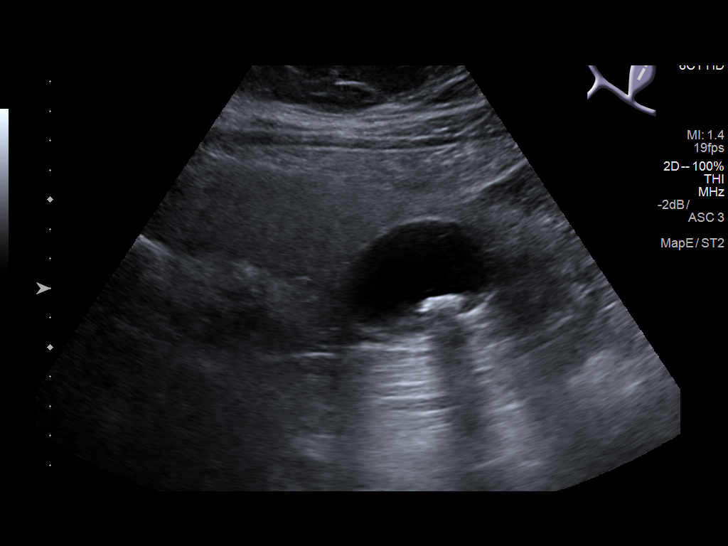
[im 21/25]
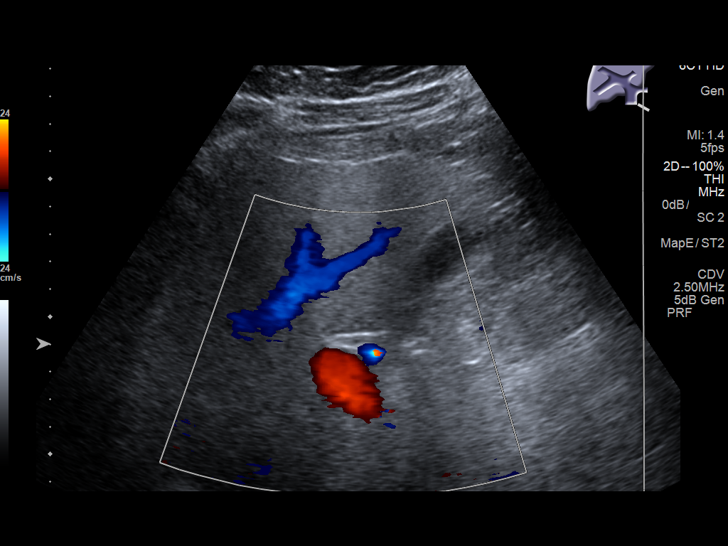
[im 23/25]
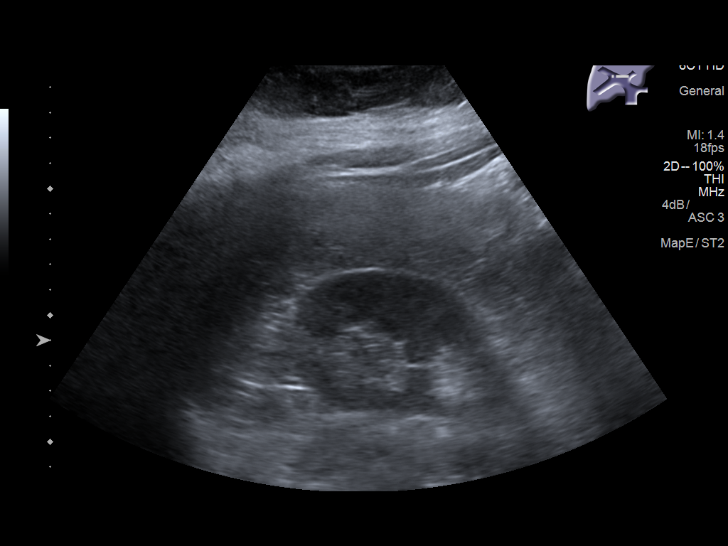
[im 25/25]
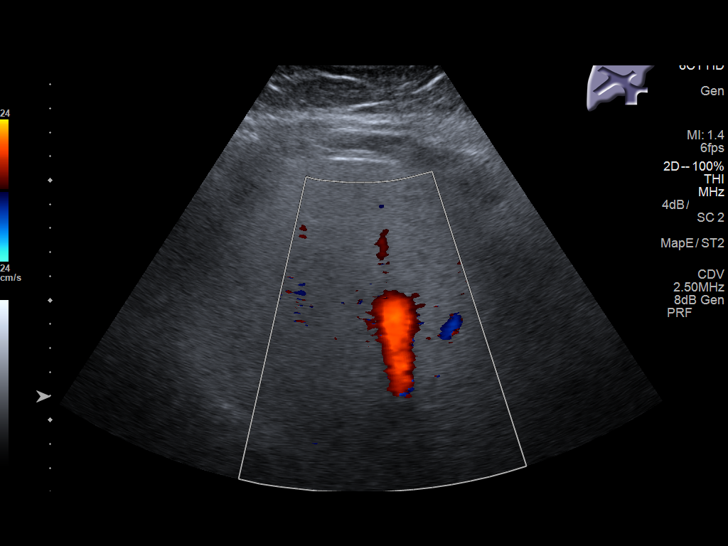

[14 of 25 positions shown; findings below may reference images not displayed]

FINDINGS: Gallbladder:

Well distended with evidence of cholelithiasis. No wall thickening
or pericholecystic fluid is noted.

Common bile duct:

Diameter: 4 mm.

Liver:

Mildly increased consistent with fatty infiltration.
IMPRESSION: No acute abnormality is noted. No significant interval change from
the prior exam is seen.

## 2017-07-29 ENCOUNTER — Ambulatory Visit (HOSPITAL_BASED_OUTPATIENT_CLINIC_OR_DEPARTMENT_OTHER): Payer: Self-pay | Attending: Family Medicine | Admitting: Internal Medicine

## 2017-07-29 VITALS — Ht 69.0 in | Wt 250.0 lb

## 2017-07-29 DIAGNOSIS — G4733 Obstructive sleep apnea (adult) (pediatric): Secondary | ICD-10-CM | POA: Insufficient documentation

## 2017-07-29 DIAGNOSIS — G4730 Sleep apnea, unspecified: Secondary | ICD-10-CM

## 2017-07-29 DIAGNOSIS — R29818 Other symptoms and signs involving the nervous system: Secondary | ICD-10-CM

## 2017-08-04 DIAGNOSIS — G4730 Sleep apnea, unspecified: Secondary | ICD-10-CM

## 2017-08-04 NOTE — Procedures (Signed)
Patient Name: David Dunlap, David Dunlap Date: 07/29/2017 Gender: Male D.O.B: [birth year] Age (years): 46 Referring Provider: Arnoldo Morale Height (inches): 69 Interpreting Physician: Baird Lyons MD, ABSM Weight (lbs): 250 RPSGT: [name] BMI: 37 MRN: [id number] Neck Size: 17.50 CLINICAL INFORMATION Sleep Study Type: Split Night CPAP  Indication for sleep study: OSA, Snoring  Epworth Sleepiness Score: 2  SLEEP STUDY TECHNIQUE As per the AASM Manual for the Scoring of Sleep and Associated Events v2.3 (April 2016) with a hypopnea requiring 4% desaturations.  The channels recorded and monitored were frontal, central and occipital EEG, electrooculogram (EOG), submentalis EMG (chin), nasal and oral airflow, thoracic and abdominal wall motion, anterior tibialis EMG, snore microphone, electrocardiogram, and pulse oximetry. Continuous positive airway pressure (CPAP) was initiated when the patient met split night criteria and was titrated according to treat sleep-disordered breathing.  MEDICATIONS Medications self-administered by patient taken the night of the study : none reported  RESPIRATORY PARAMETERS Diagnostic  Total AHI (/hr): 64.1 RDI (/hr): 71.5 OA Index (/hr): 7.8 CA Index (/hr): 0.0 REM AHI (/hr): 65.6 NREM AHI (/hr): 62.1 Supine AHI (/hr): 71.5 Non-supine AHI (/hr): 59.24 Min O2 Sat (%): 77.00 Mean O2 (%): 90.58 Time below 88% (min): 34.3   Titration  Optimal Pressure (cm): 12 AHI at Optimal Pressure (/hr): 0.0 Min O2 at Optimal Pressure (%): 89.0 Supine % at Optimal (%): 100 Sleep % at Optimal (%): 99   SLEEP ARCHITECTURE The recording time for the entire night was 374.1 minutes.  During a baseline period of 163.5 minutes, the patient slept for 131.0 minutes in REM and nonREM, yielding a sleep efficiency of 80.1%. Sleep onset after lights out was 17.3 minutes with a REM latency of 53.0 minutes. The patient spent 6.49% of the night in stage N1 sleep, 36.26% in stage N2  sleep, 0.00% in stage N3 and 57.25% in REM.  During the titration period of 205.7 minutes, the patient slept for 187.0 minutes in REM and nonREM, yielding a sleep efficiency of 90.9%. Sleep onset after CPAP initiation was 2.9 minutes with a REM latency of 46.0 minutes. The patient spent 3.48% of the night in stage N1 sleep, 34.76% in stage N2 sleep, 0.00% in stage N3 and 61.76% in REM.  CARDIAC DATA The 2 lead EKG demonstrated sinus rhythm. The mean heart rate was 85.36 beats per minute. Other EKG findings include: PVCs.  LEG MOVEMENT DATA The total Periodic Limb Movements of Sleep (PLMS) were 0. The PLMS index was 0.00 .  IMPRESSIONS - Severe obstructive sleep apnea occurred during the diagnostic portion of the study (AHI = 64.1/hour). An optimal PAP pressure was selected for this patient ( 12 cm of water) - No significant central sleep apnea occurred during the diagnostic portion of the study (CAI = 0.0/hour). - Severe oxygen desaturation was noted during the diagnostic portion of the study (Min O2 = 77.00%). - No snoring was audible during the diagnostic portion of the study. - EKG findings include PVCs. - Clinically significant periodic limb movements did not occur during sleep.  DIAGNOSIS - Obstructive Sleep Apnea (327.23 [G47.33 ICD-10])  RECOMMENDATIONS - Trial of CPAP therapy on 12 cm H2O with a Large size Resmed Full Face Mask AirFit F20 mask and heated humidification. - Avoid alcohol, sedatives and other CNS depressants that may worsen sleep apnea and disrupt normal sleep architecture. - Sleep hygiene should be reviewed to assess factors that may improve sleep quality. - Weight management and regular exercise should be initiated or continued.  [Electronically  signed] 08/04/2017 04:07 PM  Baird Lyons MD, [place], American Board of Sleep Medicine   NPI: [id number]                          [place], [place] [facility]  [facility]  ELECTRONICALLY SIGNED ON:  08/04/2017, 4:08 PM [place] PH: [phone]   FX: [phone] [place]

## 2017-08-06 ENCOUNTER — Telehealth: Payer: Self-pay

## 2017-08-06 NOTE — Telephone Encounter (Signed)
At the request of Dr Venetia NightAmao, the patient was called and informed that he has obstructive sleep apnea.

## 2017-08-06 NOTE — Telephone Encounter (Signed)
At request of Dr Venetia NightAmao, call placed to the patient and informed him that his sleep study showed that he has obstructive sleep apnea.   He does not have insurance and is not working, so he has no income to afford a CPAP machine. This CM explained that the [employer] - CPAP assistance program is available for him. They provide refurbished CPAP machines with a $100 donation to their program. [facility] can make that donation if he is not able to afford it.  This CM also explained that there is no guarantee that the machine will have heated humidification as recommended.  This CM also explained that [facility] and [facility] to for him to be instructed on the use and care of the machine  The patient stated that he would like to have the refurbished machine ordered. This information was shared with Dr Venetia NightAmao.

## 2017-08-23 ENCOUNTER — Telehealth: Payer: Self-pay | Admitting: Family Medicine

## 2017-08-23 NOTE — Telephone Encounter (Signed)
Left message for [employer] to return my call in order to make payment.

## 2017-08-23 NOTE — Telephone Encounter (Signed)
Patient's application and documents were faxed to the [employer] on 11/30 by Alycia (CMA).

## 2017-08-23 NOTE — Telephone Encounter (Signed)
Call placed to the [employer] #[phone], to check on the status of patient's application and to make a payment towards patient's CPAP machine.

## 2017-08-30 ENCOUNTER — Telehealth: Payer: Self-pay | Admitting: Family Medicine

## 2017-08-30 NOTE — Telephone Encounter (Signed)
Received call from Asher MuirJamie ([employer]) in regards to patient's application. Asher MuirJamie stated that they hadn't received any application for patient. Asked me to resend it again.  Faxed patient's application to [employer] [phone].

## 2017-09-03 ENCOUNTER — Telehealth: Payer: Self-pay | Admitting: Family Medicine

## 2017-09-03 NOTE — Telephone Encounter (Signed)
Call placed to the [employer] #[phone] to check on the status of application and to make a payment towards patient's CPAP. No answer. Left a message asking for a call back at [phone].

## 2017-09-04 ENCOUNTER — Ambulatory Visit: Payer: Self-pay | Attending: Family Medicine | Admitting: Family Medicine

## 2017-09-04 ENCOUNTER — Encounter: Payer: Self-pay | Admitting: Family Medicine

## 2017-09-04 ENCOUNTER — Telehealth: Payer: Self-pay

## 2017-09-04 VITALS — BP 134/91 | HR 62 | Temp 98.1°F | Ht 69.0 in | Wt 282.8 lb

## 2017-09-04 DIAGNOSIS — Z23 Encounter for immunization: Secondary | ICD-10-CM

## 2017-09-04 DIAGNOSIS — K219 Gastro-esophageal reflux disease without esophagitis: Secondary | ICD-10-CM

## 2017-09-04 DIAGNOSIS — Z79899 Other long term (current) drug therapy: Secondary | ICD-10-CM | POA: Insufficient documentation

## 2017-09-04 DIAGNOSIS — G629 Polyneuropathy, unspecified: Secondary | ICD-10-CM

## 2017-09-04 DIAGNOSIS — I10 Essential (primary) hypertension: Secondary | ICD-10-CM

## 2017-09-04 DIAGNOSIS — F322 Major depressive disorder, single episode, severe without psychotic features: Secondary | ICD-10-CM

## 2017-09-04 DIAGNOSIS — M25461 Effusion, right knee: Secondary | ICD-10-CM | POA: Insufficient documentation

## 2017-09-04 DIAGNOSIS — Z96651 Presence of right artificial knee joint: Secondary | ICD-10-CM | POA: Insufficient documentation

## 2017-09-04 DIAGNOSIS — Z7984 Long term (current) use of oral hypoglycemic drugs: Secondary | ICD-10-CM | POA: Insufficient documentation

## 2017-09-04 DIAGNOSIS — Z79891 Long term (current) use of opiate analgesic: Secondary | ICD-10-CM | POA: Insufficient documentation

## 2017-09-04 DIAGNOSIS — Z7901 Long term (current) use of anticoagulants: Secondary | ICD-10-CM | POA: Insufficient documentation

## 2017-09-04 DIAGNOSIS — I82509 Chronic embolism and thrombosis of unspecified deep veins of unspecified lower extremity: Secondary | ICD-10-CM

## 2017-09-04 DIAGNOSIS — E1142 Type 2 diabetes mellitus with diabetic polyneuropathy: Secondary | ICD-10-CM | POA: Insufficient documentation

## 2017-09-04 DIAGNOSIS — Z7982 Long term (current) use of aspirin: Secondary | ICD-10-CM | POA: Insufficient documentation

## 2017-09-04 DIAGNOSIS — E119 Type 2 diabetes mellitus without complications: Secondary | ICD-10-CM

## 2017-09-04 DIAGNOSIS — Z9049 Acquired absence of other specified parts of digestive tract: Secondary | ICD-10-CM | POA: Insufficient documentation

## 2017-09-04 DIAGNOSIS — E1169 Type 2 diabetes mellitus with other specified complication: Secondary | ICD-10-CM

## 2017-09-04 DIAGNOSIS — G4733 Obstructive sleep apnea (adult) (pediatric): Secondary | ICD-10-CM

## 2017-09-04 LAB — POCT GLYCOSYLATED HEMOGLOBIN (HGB A1C): HEMOGLOBIN A1C: 7.4

## 2017-09-04 LAB — GLUCOSE, POCT (MANUAL RESULT ENTRY): POC Glucose: 195 mg/dl — AB (ref 70–99)

## 2017-09-04 MED ORDER — GABAPENTIN 300 MG PO CAPS: 300.0000 mg | ORAL_CAPSULE | Freq: Three times a day (TID) | ORAL | 5 refills | Status: DC

## 2017-09-04 MED ORDER — ATORVASTATIN CALCIUM 20 MG PO TABS: 20.0000 mg | ORAL_TABLET | Freq: Every day | ORAL | 5 refills | Status: DC

## 2017-09-04 MED ORDER — HYDROCHLOROTHIAZIDE 25 MG PO TABS: 25.0000 mg | ORAL_TABLET | Freq: Every day | ORAL | 5 refills | Status: DC

## 2017-09-04 MED ORDER — LISINOPRIL 10 MG PO TABS: 10.0000 mg | ORAL_TABLET | Freq: Every day | ORAL | 5 refills | Status: DC

## 2017-09-04 MED ORDER — FAMOTIDINE 20 MG PO TABS: 20.0000 mg | ORAL_TABLET | Freq: Every day | ORAL | 5 refills | Status: DC | PRN

## 2017-09-04 MED ORDER — GLIPIZIDE 5 MG PO TABS: 5.0000 mg | ORAL_TABLET | Freq: Two times a day (BID) | ORAL | 5 refills | Status: DC

## 2017-09-04 MED ORDER — RIVAROXABAN 20 MG PO TABS: 20.0000 mg | ORAL_TABLET | Freq: Every day | ORAL | 3 refills | Status: DC

## 2017-09-04 MED ORDER — FLUOXETINE HCL 20 MG PO TABS: 40.0000 mg | ORAL_TABLET | Freq: Every day | ORAL | 5 refills | Status: DC

## 2017-09-04 MED ORDER — METFORMIN HCL 500 MG PO TABS: 1000.0000 mg | ORAL_TABLET | Freq: Two times a day (BID) | ORAL | 5 refills | Status: DC

## 2017-09-04 MED FILL — HYDROCHLOROTHIAZIDE 25 MG T: 25 | 30 days supply | Qty: 30 | Fill #0

## 2017-09-04 MED FILL — XARELTO 20 MG TABLET: 20 | 30 days supply | Qty: 30 | Fill #0

## 2017-09-04 MED FILL — ?GLIPIZIDE 5MG TABLET: 5 | 30 days supply | Qty: 60 | Fill #0

## 2017-09-04 MED FILL — ?ATORVASTATIN 20 MG TABLET: 20 | 30 days supply | Qty: 30 | Fill #0

## 2017-09-04 MED FILL — ?FAMOTIDINE 20 MG TABLET: 20 | 30 days supply | Qty: 30 | Fill #0

## 2017-09-04 MED FILL — ?FLUOXETINE HCL 20MG TABLET: 20 | 30 days supply | Qty: 60 | Fill #0

## 2017-09-04 MED FILL — ?METFORMIN HCL 500MG TABLET: 500 | 30 days supply | Qty: 120 | Fill #0

## 2017-09-04 MED FILL — GABAPENTIN 300 MG CAPSULE: 300 | 30 days supply | Qty: 90 | Fill #0

## 2017-09-04 MED FILL — LISINOPRIL 10 MG TABS: 10 | 30 days supply | Qty: 30 | Fill #0

## 2017-09-04 NOTE — Patient Instructions (Signed)
Diabetes Mellitus and Nutrition When you have diabetes (diabetes mellitus), it is very important to have healthy eating habits because your blood sugar (glucose) levels are greatly affected by what you eat and drink. Eating healthy foods in the appropriate amounts, at about the same times every day, can help you:  Control your blood glucose.  Lower your risk of heart disease.  Improve your blood pressure.  Reach or maintain a healthy weight.  Every person with diabetes is different, and each person has different needs for a meal plan. Your health care provider may recommend that you work with a diet and nutrition specialist (dietitian) to make a meal plan that is best for you. Your meal plan may vary depending on factors such as:  The calories you need.  The medicines you take.  Your weight.  Your blood glucose, blood pressure, and cholesterol levels.  Your activity level.  Other health conditions you have, such as heart or kidney disease.  How do carbohydrates affect me? Carbohydrates affect your blood glucose level more than any other type of food. Eating carbohydrates naturally increases the amount of glucose in your blood. Carbohydrate counting is a method for keeping track of how many carbohydrates you eat. Counting carbohydrates is important to keep your blood glucose at a healthy level, especially if you use insulin or take certain oral diabetes medicines. It is important to know how many carbohydrates you can safely have in each meal. This is different for every person. Your dietitian can help you calculate how many carbohydrates you should have at each meal and for snack. Foods that contain carbohydrates include:  Bread, cereal, rice, pasta, and crackers.  Potatoes and corn.  Peas, beans, and lentils.  Milk and yogurt.  Fruit and juice.  Desserts, such as cakes, cookies, ice cream, and candy.  How does alcohol affect me? Alcohol can cause a sudden decrease in blood  glucose (hypoglycemia), especially if you use insulin or take certain oral diabetes medicines. Hypoglycemia can be a life-threatening condition. Symptoms of hypoglycemia (sleepiness, dizziness, and confusion) are similar to symptoms of having too much alcohol. If your health care provider says that alcohol is safe for you, follow these guidelines:  Limit alcohol intake to no more than 1 drink per day for nonpregnant women and 2 drinks per day for men. One drink equals 12 oz of beer, 5 oz of wine, or 1 oz of hard liquor.  Do not drink on an empty stomach.  Keep yourself hydrated with water, diet soda, or unsweetened iced tea.  Keep in mind that regular soda, juice, and other mixers may contain a lot of sugar and must be counted as carbohydrates.  What are tips for following this plan? Reading food labels  Start by checking the serving size on the label. The amount of calories, carbohydrates, fats, and other nutrients listed on the label are based on one serving of the food. Many foods contain more than one serving per package.  Check the total grams (g) of carbohydrates in one serving. You can calculate the number of servings of carbohydrates in one serving by dividing the total carbohydrates by 15. For example, if a food has 30 g of total carbohydrates, it would be equal to 2 servings of carbohydrates.  Check the number of grams (g) of saturated and trans fats in one serving. Choose foods that have low or no amount of these fats.  Check the number of milligrams (mg) of sodium in one serving. Most people   should limit total sodium intake to less than 2,300 mg per day.  Always check the nutrition information of foods labeled as "low-fat" or "nonfat". These foods may be higher in added sugar or refined carbohydrates and should be avoided.  Talk to your dietitian to identify your daily goals for nutrients listed on the label. Shopping  Avoid buying canned, premade, or processed foods. These  foods tend to be high in fat, sodium, and added sugar.  Shop around the outside edge of the grocery store. This includes fresh fruits and vegetables, bulk grains, fresh meats, and fresh dairy. Cooking  Use low-heat cooking methods, such as baking, instead of high-heat cooking methods like deep frying.  Cook using healthy oils, such as olive, canola, or sunflower oil.  Avoid cooking with butter, cream, or high-fat meats. Meal planning  Eat meals and snacks regularly, preferably at the same times every day. Avoid going long periods of time without eating.  Eat foods high in fiber, such as fresh fruits, vegetables, beans, and whole grains. Talk to your dietitian about how many servings of carbohydrates you can eat at each meal.  Eat 4-6 ounces of lean protein each day, such as lean meat, chicken, fish, eggs, or tofu. 1 ounce is equal to 1 ounce of meat, chicken, or fish, 1 egg, or 1/4 cup of tofu.  Eat some foods each day that contain healthy fats, such as avocado, nuts, seeds, and fish. Lifestyle   Check your blood glucose regularly.  Exercise at least 30 minutes 5 or more days each week, or as told by your health care provider.  Take medicines as told by your health care provider.  Do not use any products that contain nicotine or tobacco, such as cigarettes and e-cigarettes. If you need help quitting, ask your health care provider.  Work with a counselor or diabetes educator to identify strategies to manage stress and any emotional and social challenges. What are some questions to ask my health care provider?  Do I need to meet with a diabetes educator?  Do I need to meet with a dietitian?  What number can I call if I have questions?  When are the best times to check my blood glucose? Where to find more information:  American Diabetes Association: diabetes.org/food-and-fitness/food  Academy of Nutrition and Dietetics:  www.eatright.org/resources/health/diseases-and-conditions/diabetes  National Institute of Diabetes and Digestive and Kidney Diseases (NIH): www.niddk.nih.gov/health-information/diabetes/overview/diet-eating-physical-activity Summary  A healthy meal plan will help you control your blood glucose and maintain a healthy lifestyle.  Working with a diet and nutrition specialist (dietitian) can help you make a meal plan that is best for you.  Keep in mind that carbohydrates and alcohol have immediate effects on your blood glucose levels. It is important to count carbohydrates and to use alcohol carefully. This information is not intended to replace advice given to you by your health care provider. Make sure you discuss any questions you have with your health care provider. Document Released: 05/31/2005 Document Revised: 10/08/2016 Document Reviewed: 10/08/2016 Elsevier Interactive Patient Education  2018 Elsevier Inc.  

## 2017-09-04 NOTE — Telephone Encounter (Signed)
At request of Dr Venetia NightAmao, call placed to the patient to inform him of the status of the CPAP machine.  Explained to him that we are working with the [employer] ( ASAA) - CPAP assistance program and the process for obtaining a machine can take about a month. If he would prefer, an order for the machine can be sent to a local DME provider and then he would be responsible for the cost of the machine.  He stated that he preferred to wait for the machine through the ASAA.

## 2017-09-05 ENCOUNTER — Encounter: Payer: Self-pay | Admitting: Family Medicine

## 2017-09-05 ENCOUNTER — Telehealth: Payer: Self-pay | Admitting: Family Medicine

## 2017-09-05 DIAGNOSIS — G4733 Obstructive sleep apnea (adult) (pediatric): Secondary | ICD-10-CM | POA: Insufficient documentation

## 2017-09-05 NOTE — Telephone Encounter (Signed)
Call placed to [employer] #[phone] to make payment towards patient's CPAP machine. No answer. Left them a message asking them to return my call at [phone].  °

## 2017-09-05 NOTE — Progress Notes (Signed)
Subjective:  Patient ID: David Dunlap, male    DOB: 06/12/1971  Age: 46 y.o. MRN: [id number]  CC: Diabetes and Hypertension   HPI David Dunlap is a  46 year old male with a history of type 2 diabetes mellitus (A1c 7.4) hypertension, chronic PE, depression, right total knee replacement in 04/2017 who presents today for a follow-up visit.  He was recently diagnosed with obstructive sleep apnea from a sleep study on 07/29/17 but is yet to obtain his CPAP machine and complains of poor quality of sleep.  He does have some residual right knee pain and swelling which has improved significantly compared to prior to his knee surgery.  He endorses compliance with his diabetic medications but not with a diabetic diet and does not exercise and his A1c has trended up from 6.5 to 7.4.  His diabetic neuropathy is controlled, he denies blurred vision, hypoglycemia. He tolerates all his other medications and has no adverse effects.  Past Medical History:  Diagnosis Date  . Arthritis   . Depression   . Diabetes mellitus without complication (HCC) 05/2016   NEW ONSET    WITH FAMILY HISTORY  . DVT (deep venous thrombosis) (HCC)   . Essential hypertension   . Gallstones 05/2016  . GERD (gastroesophageal reflux disease)     Past Surgical History:  Procedure Laterality Date  . CHOLECYSTECTOMY N/A 06/07/2016   Procedure: LAPAROSCOPIC CHOLECYSTECTOMY WITH ATTEMPTED INTRAOPERATIVE CHOLANGIOGRAM;  Surgeon: Violeta Gelinas, MD;  Location: [facility] OR;  Service: General;  Laterality: N/A;  . LIGAMENT REPAIR     R forearm  . TOTAL KNEE ARTHROPLASTY Right 04/17/2017   Procedure: RIGHT TOTAL KNEE ARTHROPLASTY;  Surgeon: Tarry Kos, MD;  Location: [facility] OR;  Service: Orthopedics;  Laterality: Right;    No Known Allergies   Outpatient Medications Prior to Visit  Medication Sig Dispense Refill  . Blood Glucose Monitoring Suppl (TRUE METRIX METER) DEVI 1 each by Does not apply route 3 (three) times daily  before meals. 1 Device 0  . cetirizine (ZYRTEC) 10 MG tablet Take 1 tablet (10 mg total) by mouth daily. 30 tablet 1  . glucose blood (TRUE METRIX BLOOD GLUCOSE TEST) test strip Used daily before meals 100 each 12  . methocarbamol (ROBAXIN) 750 MG tablet TAKE 1 TABLET BY MOUTH TWICE A DAY AS NEEDED FOR MUSCLE SPASMS 60 tablet 0  . TRUEPLUS LANCETS 28G MISC Use daily before meals 30 each 12  . aspirin EC 81 MG tablet Take 81 mg by mouth daily.    Marland Kitchen atorvastatin (LIPITOR) 20 MG tablet Take 1 tablet (20 mg total) by mouth daily. 30 tablet 3  . FLUoxetine (PROZAC) 20 MG tablet Take 2 tablets (40 mg total) by mouth daily. 60 tablet 5  . glipiZIDE (GLUCOTROL) 5 MG tablet Take 1 tablet (5 mg total) by mouth 2 (two) times daily before a meal. 60 tablet 5  . hydrochlorothiazide (HYDRODIURIL) 25 MG tablet Take 1 tablet (25 mg total) by mouth daily. 30 tablet 5  . lisinopril (PRINIVIL,ZESTRIL) 10 MG tablet Take 1 tablet (10 mg total) by mouth daily. 30 tablet 5  . metFORMIN (GLUCOPHAGE) 500 MG tablet Take 2 tablets (1,000 mg total) by mouth 2 (two) times daily with a meal. 120 tablet 5  . rivaroxaban (XARELTO) 20 MG TABS tablet Take 1 tablet (20 mg total) by mouth daily with supper. 90 tablet 3  . HYDROcodone-acetaminophen (NORCO) 5-325 MG tablet Take 1 tablet by mouth daily as needed. (Patient not taking:  Reported on 09/04/2017) 20 tablet 0  . HYDROcodone-acetaminophen (NORCO) 7.5-325 MG tablet Take 1-2 tablets by mouth daily as needed for moderate pain. (Patient not taking: Reported on 09/04/2017) 40 tablet 0  . oxyCODONE (OXY IR/ROXICODONE) 5 MG immediate release tablet Take 1-3 tablets (5-15 mg total) by mouth every 4 (four) hours as needed. (Patient not taking: Reported on 05/14/2017) 90 tablet 0  . oxyCODONE (OXYCONTIN) 10 mg 12 hr tablet Take 1 tablet (10 mg total) by mouth every 12 (twelve) hours. (Patient not taking: Reported on 05/14/2017) 10 tablet 0  . oxyCODONE-acetaminophen (PERCOCET) 5-325 MG  tablet Take 1-2 tablets by mouth 2 (two) times daily as needed for severe pain. (Patient not taking: Reported on 05/14/2017) 40 tablet 0  . senna-docusate (SENOKOT S) 8.6-50 MG tablet Take 1 tablet by mouth at bedtime as needed. (Patient not taking: Reported on 09/04/2017) 30 tablet 1  . famotidine (PEPCID) 20 MG tablet Take 1 tablet (20 mg total) by mouth daily as needed for heartburn. 30 tablet 3  . gabapentin (NEURONTIN) 300 MG capsule Take 1 capsule (300 mg total) by mouth 3 (three) times daily. (Patient not taking: Reported on 09/04/2017) 90 capsule 5   No facility-administered medications prior to visit.     ROS Review of Systems  Constitutional: Negative for activity change and appetite change.  HENT: Negative for sinus pressure and sore throat.   Eyes: Negative for visual disturbance.  Respiratory: Negative for cough, chest tightness and shortness of breath.   Cardiovascular: Negative for chest pain and leg swelling.  Gastrointestinal: Negative for abdominal distention, abdominal pain, constipation and diarrhea.  Endocrine: Negative.   Genitourinary: Negative for dysuria.  Musculoskeletal: Negative for joint swelling and myalgias.  Skin: Negative for rash.  Allergic/Immunologic: Negative.   Neurological: Negative for weakness, light-headedness and numbness.  Psychiatric/Behavioral: Negative for dysphoric mood and suicidal ideas.    Objective:  BP (!) 134/91   Pulse 62   Temp 98.1 F (36.7 C) (Oral)   Ht 5\' 9"  (1.753 m)   Wt 282 lb 12.8 oz (128.3 kg)   SpO2 94%   BMI 41.76 kg/m   BP/Weight 09/04/2017 07/29/2017 06/12/2017  Systolic BP 134 - 120  Diastolic BP 91 - 83  Wt. (Lbs) 282.8 250 270.6  BMI 41.76 36.92 39.96      Physical Exam  Constitutional: He is oriented to person, place, and time. He appears well-developed and well-nourished.  Cardiovascular: Normal rate, normal heart sounds and intact distal pulses.  No murmur heard. Pulmonary/Chest: Effort normal  and breath sounds normal. He has no wheezes. He has no rales. He exhibits no tenderness.  Abdominal: Soft. Bowel sounds are normal. He exhibits no distension and no mass. There is no tenderness.  Musculoskeletal: Normal range of motion. He exhibits edema (R knee with healed vertical surgical scar).  Neurological: He is alert and oriented to person, place, and time.  Skin: Skin is warm and dry.  Psychiatric: He has a normal mood and affect.     Lab Results  Component Value Date   HGBA1C 7.4 09/04/2017    Assessment & Plan:   1. 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  Dispense: 90 tablet; Refill: 3  2. Type 2 diabetes mellitus with other specified complication, without long-term current use of insulin (HCC) A1c 7.4 which has trended up from  6.5 previously No regimen change Comply with a diabetic diet, lifestyle modifications - POCT glucose (manual entry) - POCT glycosylated hemoglobin (Hb A1C) - metFORMIN (GLUCOPHAGE) 500 MG tablet; Take 2 tablets (1,000 mg total) by mouth 2 (two) times daily with a meal.  Dispense: 120 tablet; Refill: 5 - glipiZIDE (GLUCOTROL) 5 MG tablet; Take 1 tablet (5 mg total) by mouth 2 (two) times daily before a meal.  Dispense: 60 tablet; Refill: 5 - atorvastatin (LIPITOR) 20 MG tablet; Take 1 tablet (20 mg total) by mouth daily.  Dispense: 30 tablet; Refill: 5  3. Essential hypertension Controlled, low-sodium DASH diet - lisinopril (PRINIVIL,ZESTRIL) 10 MG tablet; Take 1 tablet (10 mg total) by mouth daily.  Dispense: 30 tablet; Refill: 5 - hydrochlorothiazide (HYDRODIURIL) 25 MG tablet; Take 1 tablet (25 mg total) by mouth daily.  Dispense: 30 tablet; Refill: 5  4. Neuropathy Stable - gabapentin (NEURONTIN) 300 MG capsule; Take 1 capsule (300 mg total) by mouth 3 (three) times daily.   Dispense: 90 capsule; Refill: 5  5. Severe single current episode of major depressive disorder, without psychotic features (HCC) Controlled - FLUoxetine (PROZAC) 20 MG tablet; Take 2 tablets (40 mg total) by mouth daily.  Dispense: 60 tablet; Refill: 5  6. Gastroesophageal reflux disease without esophagitis Controlled - famotidine (PEPCID) 20 MG tablet; Take 1 tablet (20 mg total) by mouth daily as needed for heartburn.  Dispense: 30 tablet; Refill: 5  7. Need for influenza vaccination - Flu Vaccine QUAD 36+ mos IM  8. Obstructive sleep apnea Currently awaiting CPAP machine which is limited by his lack of medical coverage He is managing his currently working with the sleep apnea association to obtain one for him.   Meds ordered this encounter  Medications  . rivaroxaban (XARELTO) 20 MG TABS tablet    Sig: Take 1 tablet (20 mg total) by mouth daily with supper.    Dispense:  90 tablet    Refill:  3  . metFORMIN (GLUCOPHAGE) 500 MG tablet    Sig: Take 2 tablets (1,000 mg total) by mouth 2 (two) times daily with a meal.    Dispense:  120 tablet    Refill:  5  . lisinopril (PRINIVIL,ZESTRIL) 10 MG tablet    Sig: Take 1 tablet (10 mg total) by mouth daily.    Dispense:  30 tablet    Refill:  5  . hydrochlorothiazide (HYDRODIURIL) 25 MG tablet    Sig: Take 1 tablet (25 mg total) by mouth daily.    Dispense:  30 tablet    Refill:  5  . glipiZIDE (GLUCOTROL) 5 MG tablet    Sig: Take 1 tablet (5 mg total) by mouth 2 (two) times daily before a meal.    Dispense:  60 tablet    Refill:  5  . gabapentin (NEURONTIN) 300 MG capsule    Sig: Take 1 capsule (300 mg total) by mouth 3 (three) times daily.    Dispense:  90 capsule    Refill:  5  . FLUoxetine (PROZAC) 20 MG tablet    Sig: Take 2 tablets (40 mg total) by mouth daily.    Dispense:  60 tablet    Refill:  5  . famotidine (PEPCID) 20 MG tablet    Sig: Take 1 tablet (20 mg total) by mouth daily as needed for heartburn.     Dispense:  30 tablet    Refill:  5  . atorvastatin (LIPITOR) 20 MG tablet    Sig: Take  1 tablet (20 mg total) by mouth daily.    Dispense:  30 tablet    Refill:  5    Follow-up: Return in about 3 months (around 12/03/2017) for Follow-up of chronic medical conditions.   Jaclyn Shaggy MD

## 2017-09-11 ENCOUNTER — Telehealth: Payer: Self-pay | Admitting: Family Medicine

## 2017-09-11 ENCOUNTER — Ambulatory Visit: Payer: Self-pay | Admitting: Family Medicine

## 2017-09-11 NOTE — Telephone Encounter (Signed)
Call placed to [employer] #[phone] to make payment towards patient's CPAP machine. No answer. Left them a message asking them to return my call at [phone].

## 2017-09-13 ENCOUNTER — Telehealth: Payer: Self-pay | Admitting: Family Medicine

## 2017-09-13 NOTE — Telephone Encounter (Signed)
Returned call to the [employer]. No answer. Left message asking for a call back at #[phone]. Also, emailed [employer]egan Gramstad regarding making a payment for patient's machine.

## 2017-09-13 NOTE — Telephone Encounter (Signed)
David Dunlap from the [employer] emailed me after speaking with Asher MuirJamie and she wrote "I just saw the note on Mr. [employer] account that his program fee was made so I will add him the our ready to ship list."

## 2017-09-13 NOTE — Telephone Encounter (Signed)
Jamie from the [employer] returned my call and payment of $100 dollars was made towards patient's CPAP machine. Confirmation # is [id number].

## 2017-09-27 ENCOUNTER — Telehealth: Payer: Self-pay | Admitting: Family Medicine

## 2017-09-27 NOTE — Telephone Encounter (Signed)
[phone] regarding CPAP training for patient. Spoke with Delice Bisonara and training was scheduled for Wednesday 10/02/17 @ 10am.   Call placed to patient #[phone] to inform him that his CPAP machine has arrived and that a CPAP training has been scheduled for 10/02/17 @ 10am. Patient understood and confirmed that that day works out for him. No further questions or concerns.

## 2017-10-02 ENCOUNTER — Telehealth: Payer: Self-pay

## 2017-10-02 NOTE — Telephone Encounter (Signed)
As per Lilli Lightara, [facility], they had an emergency today and had to rescheduled the patient's CPAP teaching to 10/07/16 2 1300. They called the patient directly and informed him of the change.

## 2017-10-04 ENCOUNTER — Ambulatory Visit (INDEPENDENT_AMBULATORY_CARE_PROVIDER_SITE_OTHER): Payer: Self-pay | Admitting: Orthopaedic Surgery

## 2017-10-07 ENCOUNTER — Telehealth: Payer: Self-pay

## 2017-10-07 NOTE — Telephone Encounter (Signed)
Patient met with Phillis Knack, RT/[place] and was instructed regarding CPAP use/care/maintenance. David Dunlap said his affect appeared flat during the teaching but he was able to set up and initiate use of the machine. He was given the refurbished machine at the end of the teaching session.

## 2017-10-10 ENCOUNTER — Encounter (HOSPITAL_COMMUNITY): Payer: Self-pay

## 2017-10-10 ENCOUNTER — Emergency Department (HOSPITAL_COMMUNITY): Payer: Self-pay

## 2017-10-10 ENCOUNTER — Emergency Department (HOSPITAL_COMMUNITY)
Admission: EM | Admit: 2017-10-10 | Discharge: 2017-10-10 | Disposition: A | Discharge status: Home / Self Care | Payer: Self-pay | Attending: Emergency Medicine | Admitting: Emergency Medicine

## 2017-10-10 ENCOUNTER — Other Ambulatory Visit: Payer: Self-pay

## 2017-10-10 DIAGNOSIS — F329 Major depressive disorder, single episode, unspecified: Secondary | ICD-10-CM | POA: Insufficient documentation

## 2017-10-10 DIAGNOSIS — Z7984 Long term (current) use of oral hypoglycemic drugs: Secondary | ICD-10-CM | POA: Insufficient documentation

## 2017-10-10 DIAGNOSIS — W19XXXA Unspecified fall, initial encounter: Secondary | ICD-10-CM

## 2017-10-10 DIAGNOSIS — Y9389 Activity, other specified: Secondary | ICD-10-CM | POA: Insufficient documentation

## 2017-10-10 DIAGNOSIS — I10 Essential (primary) hypertension: Secondary | ICD-10-CM | POA: Insufficient documentation

## 2017-10-10 DIAGNOSIS — Z9049 Acquired absence of other specified parts of digestive tract: Secondary | ICD-10-CM | POA: Insufficient documentation

## 2017-10-10 DIAGNOSIS — Z7901 Long term (current) use of anticoagulants: Secondary | ICD-10-CM | POA: Insufficient documentation

## 2017-10-10 DIAGNOSIS — Z79899 Other long term (current) drug therapy: Secondary | ICD-10-CM | POA: Insufficient documentation

## 2017-10-10 DIAGNOSIS — Y998 Other external cause status: Secondary | ICD-10-CM | POA: Insufficient documentation

## 2017-10-10 DIAGNOSIS — W108XXA Fall (on) (from) other stairs and steps, initial encounter: Secondary | ICD-10-CM | POA: Insufficient documentation

## 2017-10-10 DIAGNOSIS — Y929 Unspecified place or not applicable: Secondary | ICD-10-CM | POA: Insufficient documentation

## 2017-10-10 DIAGNOSIS — E119 Type 2 diabetes mellitus without complications: Secondary | ICD-10-CM | POA: Insufficient documentation

## 2017-10-10 DIAGNOSIS — Z96651 Presence of right artificial knee joint: Secondary | ICD-10-CM | POA: Insufficient documentation

## 2017-10-10 DIAGNOSIS — S8001XA Contusion of right knee, initial encounter: Secondary | ICD-10-CM | POA: Insufficient documentation

## 2017-10-10 MED ORDER — HYDROCODONE-ACETAMINOPHEN 5-325 MG PO TABS: ORAL_TABLET | ORAL | 0 refills | Status: DC

## 2017-10-10 MED ORDER — HYDROCODONE-ACETAMINOPHEN 5-325 MG PO TABS
1.0000 | ORAL_TABLET | Freq: Once | ORAL | Status: AC
Start: 2017-10-10 — End: 2017-10-10
  Administered 2017-10-10: 1 via ORAL
  Filled 2017-10-10: qty 1

## 2017-10-10 NOTE — ED Provider Notes (Signed)
[facility] EMERGENCY DEPARTMENT Provider Note   CSN: [id number] Arrival date & time: 10/10/17  1338     History   Chief Complaint Chief Complaint  Patient presents with  . Fall     HPI  Blood pressure 133/85, pulse 73, temperature 98.5 F (36.9 C), temperature source Oral, resp. rate 16, height 5\' 9"  (1.753 m), weight 127 kg (280 lb), SpO2 98 %.  David Dunlap is a 47 y.o. male complaining of right knee and right elbow pain after falling the day before yesterday.  Patient was walking down the steps, his right knee gave way and he fell down directly onto the knee onto concrete.  Pain is severe, he is ambulatory but with a limp.  There was no head trauma, he is anticoagulated with Xarelto.  He denies any decreased range of motion, numbness or weakness.  He states that there is a swelling on the medial aspect of his right ankle and he would like to have that evaluated as he is a diabetic.  He does not have a podiatrist.  He had total knee replacement approximately 6 months ago by Dr. Roda Shutters.   Past Medical History:  Diagnosis Date  . Arthritis   . Depression   . Diabetes mellitus without complication (HCC) 05/2016   NEW ONSET    WITH FAMILY HISTORY  . DVT (deep venous thrombosis) (HCC)   . Essential hypertension   . Gallstones 05/2016  . GERD (gastroesophageal reflux disease)     Patient Active Problem List   Diagnosis Date Noted  . Obstructive sleep apnea 09/05/2017  . Total knee replacement status 04/17/2017  . GERD (gastroesophageal reflux disease) 11/27/2016  . Unilateral primary osteoarthritis, right knee 10/18/2016  . Neuropathy 07/25/2016  . Acute cholecystitis 06/05/2016  . Diabetes mellitus without complication (HCC) 05/18/2016  . Cholelithiasis 04/09/2016  . Major depressive disorder, single episode 04/09/2016  . Leg DVT (deep venous thromboembolism), chronic (HCC) 12/08/2014  . Family history of diabetes mellitus (DM) 12/08/2014  . Moderate  major depression, single episode (HCC) 08/22/2011  . Constipation 11/22/2010  . ANKLE EDEMA 10/19/2010  . Essential hypertension 10/12/2010    Past Surgical History:  Procedure Laterality Date  . CHOLECYSTECTOMY N/A 06/07/2016   Procedure: LAPAROSCOPIC CHOLECYSTECTOMY WITH ATTEMPTED INTRAOPERATIVE CHOLANGIOGRAM;  Surgeon: Violeta Gelinas, MD;  Location: [facility] OR;  Service: General;  Laterality: N/A;  . LIGAMENT REPAIR     R forearm  . TOTAL KNEE ARTHROPLASTY Right 04/17/2017   Procedure: RIGHT TOTAL KNEE ARTHROPLASTY;  Surgeon: Tarry Kos, MD;  Location: [facility] OR;  Service: Orthopedics;  Laterality: Right;       Home Medications    Prior to Admission medications   Medication Sig Start Date End Date Taking? Authorizing Provider  atorvastatin (LIPITOR) 20 MG tablet Take 1 tablet (20 mg total) by mouth daily. 09/04/17   Hoy Register, MD  Blood Glucose Monitoring Suppl (TRUE METRIX METER) DEVI 1 each by Does not apply route 3 (three) times daily before meals. 07/25/16   Hoy Register, MD  cetirizine (ZYRTEC) 10 MG tablet Take 1 tablet (10 mg total) by mouth daily. 03/11/17   Hoy Register, MD  famotidine (PEPCID) 20 MG tablet Take 1 tablet (20 mg total) by mouth daily as needed for heartburn. 09/04/17 10/04/17  Hoy Register, MD  FLUoxetine (PROZAC) 20 MG tablet Take 2 tablets (40 mg total) by mouth daily. 09/04/17   Hoy Register, MD  gabapentin (NEURONTIN) 300 MG capsule Take 1 capsule (300  mg total) by mouth 3 (three) times daily. 09/04/17   Hoy Register, MD  glipiZIDE (GLUCOTROL) 5 MG tablet Take 1 tablet (5 mg total) by mouth 2 (two) times daily before a meal. 09/04/17   Hoy Register, MD  glucose blood (TRUE METRIX BLOOD GLUCOSE TEST) test strip Used daily before meals 07/25/16   Hoy Register, MD  hydrochlorothiazide (HYDRODIURIL) 25 MG tablet Take 1 tablet (25 mg total) by mouth daily. 09/04/17   Hoy Register, MD  HYDROcodone-acetaminophen (NORCO/VICODIN) 5-325 MG tablet  Take 1-2 tablets by mouth every 6 hours as needed for pain and/or cough. 10/10/17   Ginnie Marich, Joni Reining, PA-C  lisinopril (PRINIVIL,ZESTRIL) 10 MG tablet Take 1 tablet (10 mg total) by mouth daily. 09/04/17   Hoy Register, MD  metFORMIN (GLUCOPHAGE) 500 MG tablet Take 2 tablets (1,000 mg total) by mouth 2 (two) times daily with a meal. 09/04/17   Newlin, Odette Horns, MD  methocarbamol (ROBAXIN) 750 MG tablet TAKE 1 TABLET BY MOUTH TWICE A DAY AS NEEDED FOR MUSCLE SPASMS 05/29/17   Tarry Kos, MD  oxyCODONE (OXY IR/ROXICODONE) 5 MG immediate release tablet Take 1-3 tablets (5-15 mg total) by mouth every 4 (four) hours as needed. Patient not taking: Reported on 05/14/2017 04/17/17   Tarry Kos, MD  oxyCODONE (OXYCONTIN) 10 mg 12 hr tablet Take 1 tablet (10 mg total) by mouth every 12 (twelve) hours. Patient not taking: Reported on 05/14/2017 04/17/17   Tarry Kos, MD  oxyCODONE-acetaminophen (PERCOCET) 5-325 MG tablet Take 1-2 tablets by mouth 2 (two) times daily as needed for severe pain. Patient not taking: Reported on 05/14/2017 05/03/17   Tarry Kos, MD  rivaroxaban (XARELTO) 20 MG TABS tablet Take 1 tablet (20 mg total) by mouth daily with supper. 09/04/17   Hoy Register, MD  senna-docusate (SENOKOT S) 8.6-50 MG tablet Take 1 tablet by mouth at bedtime as needed. Patient not taking: Reported on 09/04/2017 04/17/17   Tarry Kos, MD  TRUEPLUS LANCETS 28G MISC Use daily before meals 07/25/16   Hoy Register, MD    Family History Family History  Problem Relation Age of Onset  . Diabetes Mother   . Diabetes Father   . Hypertension Sister     Social History Social History   Tobacco Use  . Smoking status: Never Smoker  . Smokeless tobacco: Never Used  Substance Use Topics  . Alcohol use: Yes    Alcohol/week: 0.6 - 1.2 oz    Types: 1 - 2 Cans of beer per week    Comment: doesn't drink all the drink  . Drug use: No    Comment: last time 3 months ago ( back in 1989)      Allergies   Patient has no known allergies.   Review of Systems Review of Systems  A complete review of systems was obtained and all systems are negative except as noted in the HPI and PMH.   Physical Exam Updated Vital Signs BP 130/80   Pulse 81   Temp 98.5 F (36.9 C) (Oral)   Resp 16   Ht 5\' 9"  (1.753 m)   Wt 127 kg (280 lb)   SpO2 99%   BMI 41.35 kg/m   Physical Exam  Constitutional: He is oriented to person, place, and time. He appears well-developed and well-nourished. No distress.  HENT:  Head: Normocephalic and atraumatic.  Mouth/Throat: Oropharynx is clear and moist.  Eyes: Conjunctivae and EOM are normal. Pupils are equal, round, and reactive to light.  Neck: Normal range of motion.  Cardiovascular: Normal rate, regular rhythm and intact distal pulses.  Pulmonary/Chest: Effort normal and breath sounds normal.  Abdominal: Soft. There is no tenderness.  Musculoskeletal: Normal range of motion. He exhibits no edema or deformity.       Feet:  Right knee: No deformity, erythema or abrasions. FROM. No effusion or crepitance. Anterior and posterior drawer show no abnormal laxity. Stable to valgus and varus stress. Joint lines are non-tender. Neurovascularly intact. Pt ambulates with mildly antalgic gait.    Right elbow: No focal bony tenderness, full active range of motion flexion extension, patient can supinate and pronate without complication.  Distally neurovascularly intact.  Neurological: He is alert and oriented to person, place, and time.  Skin: He is not diaphoretic.  Psychiatric: He has a normal mood and affect.  Nursing note and vitals reviewed.    ED Treatments / Results  Labs (all labs ordered are listed, but only abnormal results are displayed) Labs Reviewed - No data to display  EKG  EKG Interpretation None       Radiology Dg Elbow Complete Right  Result Date: 10/10/2017 CLINICAL DATA:  Recent fall with right elbow pain, initial  encounter EXAM: RIGHT ELBOW - COMPLETE 3+ VIEW COMPARISON:  None. FINDINGS: There is no evidence of fracture, dislocation, or joint effusion. There is no evidence of arthropathy or other focal bone abnormality. Soft tissues are unremarkable. IMPRESSION: No acute abnormality noted. Electronically Signed   By: Alcide Clever M.D.   On: 10/10/2017 17:02   Dg Knee Complete 4 Views Right  Result Date: 10/10/2017 CLINICAL DATA:  Recent fall with right knee pain, initial encounter EXAM: RIGHT KNEE - COMPLETE 4+ VIEW COMPARISON:  04/17/2017 FINDINGS: Right knee replacement is again noted. No evidence of loosening is seen. Some soft tissue calcifications are noted along the medial aspect of the tibia and lateral aspect of the distal femur which were not seen on the prior intraoperative exam and likely related to soft tissue calcifications. No acute fracture or dislocation is noted. IMPRESSION: No acute abnormality noted. Electronically Signed   By: Alcide Clever M.D.   On: 10/10/2017 17:01    Procedures Procedures (including critical care time)  Medications Ordered in ED Medications  HYDROcodone-acetaminophen (NORCO/VICODIN) 5-325 MG per tablet 1 tablet (1 tablet Oral Given 10/10/17 1743)     Initial Impression / Assessment and Plan / ED Course  I have reviewed the triage vital signs and the nursing notes.  Pertinent labs & imaging results that were available during my care of the patient were reviewed by me and considered in my medical decision making (see chart for details).     Vitals:   10/10/17 1444 10/10/17 1605 10/10/17 1803  BP: 126/88 133/85 130/80  Pulse: 82 73 81  Resp: 18 16 16   Temp: 98.5 F (36.9 C)    TempSrc: Oral    SpO2: 95% 98% 99%  Weight: 127 kg (280 lb)    Height: 5\' 9"  (1.753 m)      Medications  HYDROcodone-acetaminophen (NORCO/VICODIN) 5-325 MG per tablet 1 tablet (1 tablet Oral Given 10/10/17 1743)    David Dunlap is 47 y.o. male presenting with knee and  elbow pain status post fall 2 days ago.  Patient is anticoagulated however, he did not hit his head, grossly nonfocal neurologic exam, x-rays negative, he has a firm area of swelling inferior to the right medial malleolus, no ulcerations or sign of infection.  I have advised  him to follow-up with orthopedist on this, patient will be given crutches, pain medication  Evaluation does not show pathology that would require ongoing emergent intervention or inpatient treatment. Pt is hemodynamically stable and mentating appropriately. Discussed findings and plan with patient/guardian, who agrees with care plan. All questions answered. Return precautions discussed and outpatient follow up given.      Final Clinical Impressions(s) / ED Diagnoses   Final diagnoses:  Fall, initial encounter  Contusion of right knee, initial encounter    ED Discharge Orders        Ordered    HYDROcodone-acetaminophen (NORCO/VICODIN) 5-325 MG tablet     10/10/17 1739       Marquel Spoto, Mardella Laymanicole, PA-C 10/10/17 2034    Arby BarrettePfeiffer, Marcy, MD 10/15/17 [phone]

## 2017-10-10 NOTE — Discharge Instructions (Signed)
Rest, Ice intermittently (in the first 24-48 hours), Gentle compression with an Ace wrap, and elevate (Limb above the level of the heart) °  °Take up to 800mg of ibuprofen (that is usually 4 over the counter pills)  3 times a day for 5 days. Take with food. ° °Please follow with your primary care doctor in the next 2 days for a check-up. They must obtain records for further management.  ° °Do not hesitate to return to the Emergency Department for any new, worsening or concerning symptoms.  ° °

## 2017-10-10 NOTE — ED Notes (Signed)
Pt verbalized understanding discharge instructions and denies any further needs or questions at this time. VS stable, ambulatory and steady gait.   

## 2017-10-10 NOTE — ED Triage Notes (Signed)
Per Pt, Pt is coming from home with fall that happened yesterday when his right knee gave out on him and her fell on it. Hx of recent knee replacement. Reports some pain to the right elbow as well. Pt ambulatory in ED.

## 2017-10-16 ENCOUNTER — Telehealth: Payer: Self-pay | Admitting: Family Medicine

## 2017-10-16 NOTE — Telephone Encounter (Signed)
Pt called to request a refill on  -FLUoxetine (PROZAC) 20 MG tablet please send to [facility] I think he still has refills Please follow up

## 2017-10-16 NOTE — Telephone Encounter (Signed)
Verified with pharmacy, he has refills. No refills needs to be ordered. Pharmacy to fill fluoxetine.

## 2017-11-03 ENCOUNTER — Emergency Department (HOSPITAL_COMMUNITY): Payer: Self-pay

## 2017-11-03 ENCOUNTER — Emergency Department (HOSPITAL_COMMUNITY)
Admission: EM | Admit: 2017-11-03 | Discharge: 2017-11-04 | Disposition: A | Discharge status: Home / Self Care | Payer: Self-pay | Attending: Emergency Medicine | Admitting: Emergency Medicine

## 2017-11-03 ENCOUNTER — Other Ambulatory Visit: Payer: Self-pay

## 2017-11-03 ENCOUNTER — Encounter (HOSPITAL_COMMUNITY): Payer: Self-pay

## 2017-11-03 DIAGNOSIS — R1084 Generalized abdominal pain: Secondary | ICD-10-CM | POA: Insufficient documentation

## 2017-11-03 DIAGNOSIS — I10 Essential (primary) hypertension: Secondary | ICD-10-CM | POA: Insufficient documentation

## 2017-11-03 DIAGNOSIS — Z7901 Long term (current) use of anticoagulants: Secondary | ICD-10-CM | POA: Insufficient documentation

## 2017-11-03 DIAGNOSIS — R197 Diarrhea, unspecified: Secondary | ICD-10-CM | POA: Insufficient documentation

## 2017-11-03 DIAGNOSIS — R112 Nausea with vomiting, unspecified: Secondary | ICD-10-CM | POA: Insufficient documentation

## 2017-11-03 DIAGNOSIS — Z79899 Other long term (current) drug therapy: Secondary | ICD-10-CM | POA: Insufficient documentation

## 2017-11-03 DIAGNOSIS — E119 Type 2 diabetes mellitus without complications: Secondary | ICD-10-CM | POA: Insufficient documentation

## 2017-11-03 DIAGNOSIS — Z7984 Long term (current) use of oral hypoglycemic drugs: Secondary | ICD-10-CM | POA: Insufficient documentation

## 2017-11-03 HISTORY — DX: Sickle-cell disease without crisis: D57.1

## 2017-11-03 LAB — CBC
HEMATOCRIT: 45.3 % (ref 39.0–52.0)
Hemoglobin: 16.1 g/dL (ref 13.0–17.0)
MCH: 29.2 pg (ref 26.0–34.0)
MCHC: 35.5 g/dL (ref 30.0–36.0)
MCV: 82.1 fL (ref 78.0–100.0)
Platelets: 205 10*3/uL (ref 150–400)
RBC: 5.52 MIL/uL (ref 4.22–5.81)
RDW: 13.9 % (ref 11.5–15.5)
WBC: 13.8 10*3/uL — AB (ref 4.0–10.5)

## 2017-11-03 LAB — BASIC METABOLIC PANEL
ANION GAP: 10 (ref 5–15)
BUN: 19 mg/dL (ref 6–20)
CO2: 22 mmol/L (ref 22–32)
Calcium: 9.3 mg/dL (ref 8.9–10.3)
Chloride: 105 mmol/L (ref 101–111)
Creatinine, Ser: 1.06 mg/dL (ref 0.61–1.24)
Glucose, Bld: 177 mg/dL — ABNORMAL HIGH (ref 65–99)
POTASSIUM: 4.5 mmol/L (ref 3.5–5.1)
SODIUM: 137 mmol/L (ref 135–145)

## 2017-11-03 LAB — LIPASE, BLOOD: Lipase: 33 U/L (ref 11–51)

## 2017-11-03 LAB — HEPATIC FUNCTION PANEL
ALT: 32 U/L (ref 17–63)
AST: 19 U/L (ref 15–41)
Albumin: 3.8 g/dL (ref 3.5–5.0)
Alkaline Phosphatase: 82 U/L (ref 38–126)
Bilirubin, Direct: 0.2 mg/dL (ref 0.1–0.5)
Indirect Bilirubin: 0.6 mg/dL (ref 0.3–0.9)
TOTAL PROTEIN: 7 g/dL (ref 6.5–8.1)
Total Bilirubin: 0.8 mg/dL (ref 0.3–1.2)

## 2017-11-03 LAB — I-STAT TROPONIN, ED: Troponin i, poc: 0 ng/mL (ref 0.00–0.08)

## 2017-11-03 MED ORDER — ONDANSETRON HCL 4 MG/2ML IJ SOLN
4.0000 mg | Freq: Once | INTRAMUSCULAR | Status: AC
  Administered 2017-11-03: 4 mg via INTRAVENOUS
  Filled 2017-11-03: qty 2

## 2017-11-03 MED ORDER — IOPAMIDOL (ISOVUE-300) INJECTION 61%
INTRAVENOUS | Status: AC
Start: 2017-11-03 — End: 2017-11-03
  Administered 2017-11-03: 100 mL
  Filled 2017-11-03: qty 100

## 2017-11-03 MED ORDER — MORPHINE SULFATE (PF) 4 MG/ML IV SOLN
4.0000 mg | Freq: Once | INTRAVENOUS | Status: AC
  Administered 2017-11-03: 4 mg via INTRAVENOUS
  Filled 2017-11-03: qty 1

## 2017-11-03 NOTE — ED Triage Notes (Signed)
Patient complains of abdominal pain, chest pain with vomiting and diarrhea since am. Alert and oriented, no SOB. NAD

## 2017-11-03 NOTE — ED Notes (Signed)
Lab to add on hepatic function panel and lipase to previously collected samples.

## 2017-11-03 NOTE — ED Provider Notes (Signed)
[facility] EMERGENCY DEPARTMENT Provider Note   CSN: [id number] Arrival date & time: 11/03/17  1657     History   Chief Complaint No chief complaint on file.   HPI David Dunlap is a 47 y.o. male.  HPI David Dunlap is a 47 y.o. male presents to emergency department with complaint of abdominal pain.  Patient states his pain started this morning.  He reports associated nausea, vomiting, diarrhea.  Reports multiple episodes of both.  States his diarrhea is watery.  Reports chills, denies any fever.  Reports associated cough.  He is pain is all over his abdomen radiating to the chest.  He has not taken any medications prior to coming denies any prior similar pain.  No sick contacts.  States that at this time nausea and vomiting has improved and he was able to keep a sandwich down.  He states his last episode of diarrhea was just prior to me coming into the room.  He states he feels like his abdomen is swollen.  Past Medical History:  Diagnosis Date  . Arthritis   . Depression   . Diabetes mellitus without complication (HCC) 05/2016   NEW ONSET    WITH FAMILY HISTORY  . DVT (deep venous thrombosis) (HCC)   . Essential hypertension   . Gallstones 05/2016  . GERD (gastroesophageal reflux disease)     Patient Active Problem List   Diagnosis Date Noted  . Obstructive sleep apnea 09/05/2017  . Total knee replacement status 04/17/2017  . GERD (gastroesophageal reflux disease) 11/27/2016  . Unilateral primary osteoarthritis, right knee 10/18/2016  . Neuropathy 07/25/2016  . Acute cholecystitis 06/05/2016  . Diabetes mellitus without complication (HCC) 05/18/2016  . Cholelithiasis 04/09/2016  . Major depressive disorder, single episode 04/09/2016  . Leg DVT (deep venous thromboembolism), chronic (HCC) 12/08/2014  . Family history of diabetes mellitus (DM) 12/08/2014  . Moderate major depression, single episode (HCC) 08/22/2011  . Constipation 11/22/2010    . ANKLE EDEMA 10/19/2010  . Essential hypertension 10/12/2010    Past Surgical History:  Procedure Laterality Date  . CHOLECYSTECTOMY N/A 06/07/2016   Procedure: LAPAROSCOPIC CHOLECYSTECTOMY WITH ATTEMPTED INTRAOPERATIVE CHOLANGIOGRAM;  Surgeon: Violeta Gelinas, MD;  Location: [facility] OR;  Service: General;  Laterality: N/A;  . LIGAMENT REPAIR     R forearm  . TOTAL KNEE ARTHROPLASTY Right 04/17/2017   Procedure: RIGHT TOTAL KNEE ARTHROPLASTY;  Surgeon: Tarry Kos, MD;  Location: [facility] OR;  Service: Orthopedics;  Laterality: Right;       Home Medications    Prior to Admission medications   Medication Sig Start Date End Date Taking? Authorizing Provider  atorvastatin (LIPITOR) 20 MG tablet Take 1 tablet (20 mg total) by mouth daily. 09/04/17   Hoy Register, MD  Blood Glucose Monitoring Suppl (TRUE METRIX METER) DEVI 1 each by Does not apply route 3 (three) times daily before meals. 07/25/16   Hoy Register, MD  cetirizine (ZYRTEC) 10 MG tablet Take 1 tablet (10 mg total) by mouth daily. 03/11/17   Hoy Register, MD  famotidine (PEPCID) 20 MG tablet Take 1 tablet (20 mg total) by mouth daily as needed for heartburn. 09/04/17 10/04/17  Hoy Register, MD  FLUoxetine (PROZAC) 20 MG tablet Take 2 tablets (40 mg total) by mouth daily. 09/04/17   Hoy Register, MD  gabapentin (NEURONTIN) 300 MG capsule Take 1 capsule (300 mg total) by mouth 3 (three) times daily. 09/04/17   Hoy Register, MD  glipiZIDE (GLUCOTROL) 5 MG tablet  Take 1 tablet (5 mg total) by mouth 2 (two) times daily before a meal. 09/04/17   Hoy Register, MD  glucose blood (TRUE METRIX BLOOD GLUCOSE TEST) test strip Used daily before meals 07/25/16   Hoy Register, MD  hydrochlorothiazide (HYDRODIURIL) 25 MG tablet Take 1 tablet (25 mg total) by mouth daily. 09/04/17   Hoy Register, MD  HYDROcodone-acetaminophen (NORCO/VICODIN) 5-325 MG tablet Take 1-2 tablets by mouth every 6 hours as needed for pain and/or cough.  10/10/17   Pisciotta, Joni Reining, PA-C  lisinopril (PRINIVIL,ZESTRIL) 10 MG tablet Take 1 tablet (10 mg total) by mouth daily. 09/04/17   Hoy Register, MD  metFORMIN (GLUCOPHAGE) 500 MG tablet Take 2 tablets (1,000 mg total) by mouth 2 (two) times daily with a meal. 09/04/17   Newlin, Odette Horns, MD  methocarbamol (ROBAXIN) 750 MG tablet TAKE 1 TABLET BY MOUTH TWICE A DAY AS NEEDED FOR MUSCLE SPASMS 05/29/17   Tarry Kos, MD  oxyCODONE (OXY IR/ROXICODONE) 5 MG immediate release tablet Take 1-3 tablets (5-15 mg total) by mouth every 4 (four) hours as needed. Patient not taking: Reported on 05/14/2017 04/17/17   Tarry Kos, MD  oxyCODONE (OXYCONTIN) 10 mg 12 hr tablet Take 1 tablet (10 mg total) by mouth every 12 (twelve) hours. Patient not taking: Reported on 05/14/2017 04/17/17   Tarry Kos, MD  oxyCODONE-acetaminophen (PERCOCET) 5-325 MG tablet Take 1-2 tablets by mouth 2 (two) times daily as needed for severe pain. Patient not taking: Reported on 05/14/2017 05/03/17   Tarry Kos, MD  rivaroxaban (XARELTO) 20 MG TABS tablet Take 1 tablet (20 mg total) by mouth daily with supper. 09/04/17   Hoy Register, MD  senna-docusate (SENOKOT S) 8.6-50 MG tablet Take 1 tablet by mouth at bedtime as needed. Patient not taking: Reported on 09/04/2017 04/17/17   Tarry Kos, MD  TRUEPLUS LANCETS 28G MISC Use daily before meals 07/25/16   Hoy Register, MD    Family History Family History  Problem Relation Age of Onset  . Diabetes Mother   . Diabetes Father   . Hypertension Sister     Social History Social History   Tobacco Use  . Smoking status: Never Smoker  . Smokeless tobacco: Never Used  Substance Use Topics  . Alcohol use: Yes    Alcohol/week: 0.6 - 1.2 oz    Types: 1 - 2 Cans of beer per week    Comment: doesn't drink all the drink  . Drug use: No    Comment: last time 3 months ago ( back in 1989)     Allergies   Patient has no known allergies.   Review of Systems Review of  Systems  Constitutional: Negative for chills and fever.  Respiratory: Negative for cough, chest tightness and shortness of breath.   Cardiovascular: Negative for chest pain, palpitations and leg swelling.  Gastrointestinal: Positive for abdominal pain, diarrhea, nausea and vomiting. Negative for abdominal distention.  Genitourinary: Negative for dysuria, frequency, hematuria and urgency.  Musculoskeletal: Negative for arthralgias, myalgias, neck pain and neck stiffness.  Skin: Negative for rash.  Allergic/Immunologic: Negative for immunocompromised state.  Neurological: Negative for dizziness, weakness, light-headedness, numbness and headaches.  All other systems reviewed and are negative.    Physical Exam Updated Vital Signs BP 125/73 (BP Location: Right Arm)   Pulse 83   Temp 98.8 F (37.1 C) (Oral)   Resp 16   SpO2 100%   Physical Exam  Constitutional: He is oriented to person, place, and  time. He appears well-developed and well-nourished. No distress.  HENT:  Head: Normocephalic and atraumatic.  Eyes: Conjunctivae are normal.  Neck: Neck supple.  Cardiovascular: Normal rate, regular rhythm and normal heart sounds.  Pulmonary/Chest: Effort normal. No respiratory distress. He has no wheezes. He has no rales.  Abdominal: Soft. Bowel sounds are normal. He exhibits no distension. There is no tenderness. There is no rebound.  Diffuse tenderness  Musculoskeletal: He exhibits no edema.  Neurological: He is alert and oriented to person, place, and time.  Skin: Skin is warm and dry.  Nursing note and vitals reviewed.    ED Treatments / Results  Labs (all labs ordered are listed, but only abnormal results are displayed) Labs Reviewed  BASIC METABOLIC PANEL - Abnormal; Notable for the following components:      Result Value   Glucose, Bld 177 (*)    All other components within normal limits  CBC - Abnormal; Notable for the following components:   WBC 13.8 (*)    All other  components within normal limits  HEPATIC FUNCTION PANEL  LIPASE, BLOOD  I-STAT TROPONIN, ED    EKG  EKG Interpretation None       Radiology Dg Chest 2 View  Result Date: 11/03/2017 CLINICAL DATA:  Chest pain. EXAM: CHEST  2 VIEW COMPARISON:  Chest x-ray dated November 28, 2016. FINDINGS: The heart size and mediastinal contours are within normal limits. Both lungs are clear. The visualized skeletal structures are unremarkable. IMPRESSION: No active cardiopulmonary disease. Electronically Signed   By: Obie DredgeWilliam T Derry M.D.   On: 11/03/2017 17:55    Procedures Procedures (including critical care time)  Medications Ordered in ED Medications  ondansetron (ZOFRAN) injection 4 mg (not administered)  morphine 4 MG/ML injection 4 mg (not administered)     Initial Impression / Assessment and Plan / ED Course  I have reviewed the triage vital signs and the nursing notes.  Pertinent labs & imaging results that were available during my care of the patient were reviewed by me and considered in my medical decision making (see chart for details).     Pt in ED with diffuse abdominal pain, nausea, vomiting, diarrhea. Sounds like his nausea is improving and he was able to eat with no vomiting. He is still having diarrhea, last episode right prior to me coming in. WBC is elevated at 13.8 with significant abdominal tenderness. Will give fluids, zofran, morphine for pain, CT scan ordered for further evaluation. Will also add LFTs and lipase.   12:32 AM CT scan with no surgical findings. Consistent with enteritis. Pt still having pain, however, symptoms improved. Drinking and eating. Will give bentyl for abdominal pain. Plan to dc home with antiemetics and bentyl. Follow up with pcp.   Vitals:   11/03/17 1707 11/03/17 1911 11/03/17 2130 11/03/17 2208  BP: 126/88 122/75 108/68 125/73  Pulse: 95 82 84 83  Resp: 18 15 (!) 21 16  Temp: 98.8 F (37.1 C)     TempSrc: Oral     SpO2: 94% 97% 96% 100%       Final Clinical Impressions(s) / ED Diagnoses   Final diagnoses:  Generalized abdominal pain  Nausea vomiting and diarrhea    ED Discharge Orders        Ordered    ondansetron (ZOFRAN) 4 MG tablet  Every 6 hours     11/04/17 0051    dicyclomine (BENTYL) 20 MG tablet  2 times daily     11/04/17 0051  Jaynie Crumble, PA-C 11/04/17 0054    Lorre Nick, MD 11/04/17 1745

## 2017-11-04 MED ORDER — DICYCLOMINE HCL 10 MG PO CAPS
10.0000 mg | ORAL_CAPSULE | Freq: Once | ORAL | Status: AC
  Administered 2017-11-04: 10 mg via ORAL
  Filled 2017-11-04: qty 1

## 2017-11-04 MED ORDER — ONDANSETRON HCL 4 MG PO TABS: 4.0000 mg | ORAL_TABLET | Freq: Four times a day (QID) | ORAL | 0 refills | Status: DC

## 2017-11-04 MED ORDER — DICYCLOMINE HCL 20 MG PO TABS: 20.0000 mg | ORAL_TABLET | Freq: Two times a day (BID) | ORAL | 0 refills | Status: DC

## 2017-11-04 NOTE — Discharge Instructions (Signed)
Take zofran as prescribed as needed for nausea and vomiting. Take bentyl for abdominal cramping. Advance diet as tolerate. Follow up with family doctor as needed. Return if worsening symptoms.

## 2017-11-12 ENCOUNTER — Other Ambulatory Visit: Payer: Self-pay

## 2017-11-12 ENCOUNTER — Encounter (HOSPITAL_COMMUNITY): Payer: Self-pay | Admitting: Emergency Medicine

## 2017-11-12 DIAGNOSIS — Z79899 Other long term (current) drug therapy: Secondary | ICD-10-CM | POA: Insufficient documentation

## 2017-11-12 DIAGNOSIS — I10 Essential (primary) hypertension: Secondary | ICD-10-CM | POA: Insufficient documentation

## 2017-11-12 DIAGNOSIS — E119 Type 2 diabetes mellitus without complications: Secondary | ICD-10-CM | POA: Insufficient documentation

## 2017-11-12 DIAGNOSIS — Z7984 Long term (current) use of oral hypoglycemic drugs: Secondary | ICD-10-CM | POA: Insufficient documentation

## 2017-11-12 DIAGNOSIS — R1084 Generalized abdominal pain: Secondary | ICD-10-CM | POA: Insufficient documentation

## 2017-11-12 LAB — URINALYSIS, ROUTINE W REFLEX MICROSCOPIC
BILIRUBIN URINE: NEGATIVE
Glucose, UA: 150 mg/dL — AB
HGB URINE DIPSTICK: NEGATIVE
Ketones, ur: NEGATIVE mg/dL
Leukocytes, UA: NEGATIVE
NITRITE: NEGATIVE
Protein, ur: NEGATIVE mg/dL
Specific Gravity, Urine: 1.03 (ref 1.005–1.030)
pH: 5 (ref 5.0–8.0)

## 2017-11-12 LAB — CBC
HCT: 46.1 % (ref 39.0–52.0)
Hemoglobin: 16.1 g/dL (ref 13.0–17.0)
MCH: 28.6 pg (ref 26.0–34.0)
MCHC: 34.9 g/dL (ref 30.0–36.0)
MCV: 82 fL (ref 78.0–100.0)
PLATELETS: 221 10*3/uL (ref 150–400)
RBC: 5.62 MIL/uL (ref 4.22–5.81)
RDW: 14.1 % (ref 11.5–15.5)
WBC: 15.9 10*3/uL — ABNORMAL HIGH (ref 4.0–10.5)

## 2017-11-12 NOTE — ED Triage Notes (Signed)
Pt reports gen abd pain onset yesterday. Constant, sharp, 8/10. Pt denies N/V, does report diarrhea.

## 2017-11-13 ENCOUNTER — Emergency Department (HOSPITAL_COMMUNITY)
Admission: EM | Admit: 2017-11-13 | Discharge: 2017-11-13 | Disposition: A | Discharge status: Home / Self Care | Payer: Self-pay | Attending: Emergency Medicine | Admitting: Emergency Medicine

## 2017-11-13 ENCOUNTER — Emergency Department (HOSPITAL_COMMUNITY): Payer: Self-pay

## 2017-11-13 DIAGNOSIS — R1084 Generalized abdominal pain: Secondary | ICD-10-CM

## 2017-11-13 LAB — COMPREHENSIVE METABOLIC PANEL
ALT: 29 U/L (ref 17–63)
ANION GAP: 11 (ref 5–15)
AST: 17 U/L (ref 15–41)
Albumin: 3.7 g/dL (ref 3.5–5.0)
Alkaline Phosphatase: 91 U/L (ref 38–126)
BUN: 16 mg/dL (ref 6–20)
CO2: 23 mmol/L (ref 22–32)
Calcium: 9 mg/dL (ref 8.9–10.3)
Chloride: 101 mmol/L (ref 101–111)
Creatinine, Ser: 1.16 mg/dL (ref 0.61–1.24)
GFR calc Af Amer: >60 mL/min (ref >60)
GFR calc non Af Amer: >60 mL/min (ref >60)
GLUCOSE: 186 mg/dL — AB (ref 65–99)
POTASSIUM: 4.3 mmol/L (ref 3.5–5.1)
Sodium: 135 mmol/L (ref 135–145)
Total Bilirubin: 0.7 mg/dL (ref 0.3–1.2)
Total Protein: 7.2 g/dL (ref 6.5–8.1)

## 2017-11-13 LAB — LIPASE, BLOOD: LIPASE: 26 U/L (ref 11–51)

## 2017-11-13 MED ORDER — ONDANSETRON HCL 4 MG/2ML IJ SOLN
4.0000 mg | Freq: Once | INTRAMUSCULAR | Status: AC
  Administered 2017-11-13: 4 mg via INTRAVENOUS
  Filled 2017-11-13: qty 2

## 2017-11-13 MED ORDER — SODIUM CHLORIDE 0.9 % IV BOLUS (SEPSIS)
500.0000 mL | Freq: Once | INTRAVENOUS | Status: AC
  Administered 2017-11-13: 500 mL via INTRAVENOUS

## 2017-11-13 MED ORDER — IOPAMIDOL (ISOVUE-300) INJECTION 61%
INTRAVENOUS | Status: AC
  Administered 2017-11-13: 100 mL
  Filled 2017-11-13: qty 100

## 2017-11-13 MED ORDER — MORPHINE SULFATE (PF) 4 MG/ML IV SOLN
4.0000 mg | Freq: Once | INTRAVENOUS | Status: AC
  Administered 2017-11-13: 4 mg via INTRAVENOUS
  Filled 2017-11-13: qty 1

## 2017-11-13 MED ORDER — DIPHENOXYLATE-ATROPINE 2.5-0.025 MG PO TABS: 1.0000 | ORAL_TABLET | Freq: Four times a day (QID) | ORAL | 0 refills | Status: DC | PRN

## 2017-11-13 NOTE — ED Notes (Signed)
ED Provider at bedside. 

## 2017-11-13 NOTE — ED Notes (Signed)
Pt returned from CT °

## 2017-11-13 NOTE — ED Provider Notes (Signed)
[facility] EMERGENCY DEPARTMENT Provider Note   CSN: [id number] Arrival date & time: 11/12/17  2312     History   Chief Complaint Chief Complaint  Patient presents with  . Abdominal Pain    HPI David Dunlap is a 47 y.o. male.  Patient presents to the emergency department for abdominal pain and back pain.  Patient reports that he started 2 days ago with diffuse abdominal pain.  That has slowly worsened and has been continuous.  Yesterday he started having back pain as well.  The back pain worsens when he bends or twists, denies any injury.  No radiation to the legs.  Abdominal pain is constant, moderate to severe.  No associated nausea or vomiting, has had diarrhea.  No rectal bleeding or melena.      Past Medical History:  Diagnosis Date  . Arthritis   . Depression   . Diabetes mellitus without complication (HCC) 05/2016   NEW ONSET    WITH FAMILY HISTORY  . DVT (deep venous thrombosis) (HCC)   . Essential hypertension   . Gallstones 05/2016  . GERD (gastroesophageal reflux disease)   . Sickle cell anemia [facility])     Patient Active Problem List   Diagnosis Date Noted  . Obstructive sleep apnea 09/05/2017  . Total knee replacement status 04/17/2017  . GERD (gastroesophageal reflux disease) 11/27/2016  . Unilateral primary osteoarthritis, right knee 10/18/2016  . Neuropathy 07/25/2016  . Acute cholecystitis 06/05/2016  . Diabetes mellitus without complication (HCC) 05/18/2016  . Cholelithiasis 04/09/2016  . Major depressive disorder, single episode 04/09/2016  . Leg DVT (deep venous thromboembolism), chronic (HCC) 12/08/2014  . Family history of diabetes mellitus (DM) 12/08/2014  . Moderate major depression, single episode (HCC) 08/22/2011  . Constipation 11/22/2010  . ANKLE EDEMA 10/19/2010  . Essential hypertension 10/12/2010    Past Surgical History:  Procedure Laterality Date  . CHOLECYSTECTOMY N/A 06/07/2016   Procedure:  LAPAROSCOPIC CHOLECYSTECTOMY WITH ATTEMPTED INTRAOPERATIVE CHOLANGIOGRAM;  Surgeon: Violeta Gelinas, MD;  Location: [facility] OR;  Service: General;  Laterality: N/A;  . LIGAMENT REPAIR     R forearm  . TOTAL KNEE ARTHROPLASTY Right 04/17/2017   Procedure: RIGHT TOTAL KNEE ARTHROPLASTY;  Surgeon: Tarry Kos, MD;  Location: [facility] OR;  Service: Orthopedics;  Laterality: Right;       Home Medications    Prior to Admission medications   Medication Sig Start Date End Date Taking? Authorizing Provider  atorvastatin (LIPITOR) 20 MG tablet Take 1 tablet (20 mg total) by mouth daily. 09/04/17  Yes Hoy Register, MD  cetirizine (ZYRTEC) 10 MG tablet Take 1 tablet (10 mg total) by mouth daily. 03/11/17  Yes Hoy Register, MD  dicyclomine (BENTYL) 20 MG tablet Take 1 tablet (20 mg total) by mouth 2 (two) times daily. 11/04/17  Yes Kirichenko, Tatyana, PA-C  famotidine (PEPCID) 20 MG tablet Take 20 mg by mouth daily as needed for heartburn or indigestion.   Yes [provider]  FLUoxetine (PROZAC) 20 MG tablet Take 2 tablets (40 mg total) by mouth daily. 09/04/17  Yes Hoy Register, MD  gabapentin (NEURONTIN) 300 MG capsule Take 1 capsule (300 mg total) by mouth 3 (three) times daily. 09/04/17  Yes Hoy Register, MD  glipiZIDE (GLUCOTROL) 5 MG tablet Take 1 tablet (5 mg total) by mouth 2 (two) times daily before a meal. 09/04/17  Yes Newlin, Enobong, MD  hydrochlorothiazide (HYDRODIURIL) 25 MG tablet Take 1 tablet (25 mg total) by mouth daily. 09/04/17  Yes Hoy RegisterNewlin, Enobong, MD  lisinopril (PRINIVIL,ZESTRIL) 10 MG tablet Take 1 tablet (10 mg total) by mouth daily. 09/04/17  Yes Hoy RegisterNewlin, Enobong, MD  metFORMIN (GLUCOPHAGE) 500 MG tablet Take 2 tablets (1,000 mg total) by mouth 2 (two) times daily with a meal. Patient taking differently: Take 500 mg by mouth 2 (two) times daily with a meal.  09/04/17  Yes Newlin, Enobong, MD  ondansetron (ZOFRAN) 4 MG tablet Take 1 tablet (4 mg total) by mouth every 6  (six) hours. 11/04/17  Yes Kirichenko, Tatyana, PA-C  rivaroxaban (XARELTO) 20 MG TABS tablet Take 1 tablet (20 mg total) by mouth daily with supper. Patient taking differently: Take 20 mg by mouth daily with breakfast.  09/04/17  Yes Newlin, Enobong, MD  Blood Glucose Monitoring Suppl (TRUE METRIX METER) DEVI 1 each by Does not apply route 3 (three) times daily before meals. 07/25/16   Hoy RegisterNewlin, Enobong, MD  glucose blood (TRUE METRIX BLOOD GLUCOSE TEST) test strip Used daily before meals 07/25/16   Hoy RegisterNewlin, Enobong, MD  HYDROcodone-acetaminophen (NORCO/VICODIN) 5-325 MG tablet Take 1-2 tablets by mouth every 6 hours as needed for pain and/or cough. Patient not taking: Reported on 11/03/2017 10/10/17   Pisciotta, Joni ReiningNicole, PA-C  methocarbamol (ROBAXIN) 750 MG tablet TAKE 1 TABLET BY MOUTH TWICE A DAY AS NEEDED FOR MUSCLE SPASMS Patient not taking: Reported on 11/03/2017 05/29/17   Tarry KosXu, Naiping M, MD  oxyCODONE (OXY IR/ROXICODONE) 5 MG immediate release tablet Take 1-3 tablets (5-15 mg total) by mouth every 4 (four) hours as needed. Patient not taking: Reported on 05/14/2017 04/17/17   Tarry KosXu, Naiping M, MD  oxyCODONE (OXYCONTIN) 10 mg 12 hr tablet Take 1 tablet (10 mg total) by mouth every 12 (twelve) hours. Patient not taking: Reported on 05/14/2017 04/17/17   Tarry KosXu, Naiping M, MD  oxyCODONE-acetaminophen (PERCOCET) 5-325 MG tablet Take 1-2 tablets by mouth 2 (two) times daily as needed for severe pain. Patient not taking: Reported on 05/14/2017 05/03/17   Tarry KosXu, Naiping M, MD  senna-docusate (SENOKOT S) 8.6-50 MG tablet Take 1 tablet by mouth at bedtime as needed. Patient not taking: Reported on 09/04/2017 04/17/17   Tarry KosXu, Naiping M, MD  TRUEPLUS LANCETS 28G MISC Use daily before meals 07/25/16   Hoy RegisterNewlin, Enobong, MD    Family History Family History  Problem Relation Age of Onset  . Diabetes Mother   . Diabetes Father   . Hypertension Sister     Social History Social History   Tobacco Use  . Smoking status: Never  Smoker  . Smokeless tobacco: Never Used  Substance Use Topics  . Alcohol use: Yes    Alcohol/week: 0.6 - 1.2 oz    Types: 1 - 2 Cans of beer per week    Comment: doesn't drink all the drink  . Drug use: No    Comment: last time 3 months ago ( back in 1989)     Allergies   Patient has no known allergies.   Review of Systems Review of Systems  Constitutional: Negative for fever.  Cardiovascular: Negative for chest pain.  Gastrointestinal: Positive for abdominal pain and diarrhea.  Musculoskeletal: Positive for back pain.  All other systems reviewed and are negative.    Physical Exam Updated Vital Signs BP 112/75   Pulse 87   Temp 98.5 F (36.9 C) (Oral)   Resp 18   Ht 5\' 9"  (1.753 m)   Wt 129.3 kg (285 lb)   SpO2 94%   BMI 42.09 kg/m   Physical  Exam  Constitutional: He is oriented to person, place, and time. He appears well-developed and well-nourished. No distress.  HENT:  Head: Normocephalic and atraumatic.  Right Ear: Hearing normal.  Left Ear: Hearing normal.  Nose: Nose normal.  Mouth/Throat: Oropharynx is clear and moist and mucous membranes are normal.  Eyes: Conjunctivae and EOM are normal. Pupils are equal, round, and reactive to light.  Neck: Normal range of motion. Neck supple.  Cardiovascular: Regular rhythm, S1 normal and S2 normal. Exam reveals no gallop and no friction rub.  No murmur heard. Pulmonary/Chest: Effort normal and breath sounds normal. No respiratory distress. He exhibits no tenderness.  Abdominal: Soft. Normal appearance and bowel sounds are normal. There is no hepatosplenomegaly. There is generalized tenderness. There is no rebound, no guarding, no tenderness at McBurney's point and negative Murphy's sign. No hernia.  Musculoskeletal: Normal range of motion.       Lumbar back: He exhibits tenderness.       Back:  Neurological: He is alert and oriented to person, place, and time. He has normal strength. No cranial nerve deficit or  sensory deficit. Coordination normal. GCS eye subscore is 4. GCS verbal subscore is 5. GCS motor subscore is 6.  Skin: Skin is warm, dry and intact. No rash noted. No cyanosis.  Psychiatric: He has a normal mood and affect. His speech is normal and behavior is normal. Thought content normal.  Nursing note and vitals reviewed.    ED Treatments / Results  Labs (all labs ordered are listed, but only abnormal results are displayed) Labs Reviewed  COMPREHENSIVE METABOLIC PANEL - Abnormal; Notable for the following components:      Result Value   Glucose, Bld 186 (*)    All other components within normal limits  CBC - Abnormal; Notable for the following components:   WBC 15.9 (*)    All other components within normal limits  URINALYSIS, ROUTINE W REFLEX MICROSCOPIC - Abnormal; Notable for the following components:   APPearance HAZY (*)    Glucose, UA 150 (*)    All other components within normal limits  LIPASE, BLOOD    EKG  EKG Interpretation None       Radiology Ct Abdomen Pelvis W Contrast  Result Date: 11/13/2017 CLINICAL DATA:  47 y/o M; sharp generalized abdominal pain since yesterday. Diarrhea. EXAM: CT ABDOMEN AND PELVIS WITH CONTRAST TECHNIQUE: Multidetector CT imaging of the abdomen and pelvis was performed using the standard protocol following bolus administration of intravenous contrast. CONTRAST:  ISOVUE-300 IOPAMIDOL (ISOVUE-300) INJECTION 61% COMPARISON:  11/03/2017 CT abdomen and pelvis. FINDINGS: Lower chest: No acute abnormality. Hepatobiliary: Hepatic steatosis. No focal liver abnormality is seen. Status post cholecystectomy. No biliary dilatation. Pancreas: Unremarkable. No pancreatic ductal dilatation or surrounding inflammatory changes. Spleen: Normal in size without focal abnormality. Adrenals/Urinary Tract: Adrenal glands are unremarkable. Kidneys are normal, without renal calculi, focal lesion, or hydronephrosis. Bladder is unremarkable. Stomach/Bowel:  Stomach is within normal limits. Appendix appears normal. No evidence of bowel wall thickening, distention, or inflammatory changes. Passage of fecalized small bowel contents. Vascular/Lymphatic: No significant vascular findings are present. No enlarged abdominal or pelvic lymph nodes. Reproductive: Prostate is unremarkable. Other: No abdominal wall hernia or abnormality. No abdominopelvic ascites. Musculoskeletal: No fracture is seen. Lumbar spondylosis with mild discogenic degenerative changes and predominant lower lumbar facet arthrosis. IMPRESSION: 1. No acute process identified. 2. Hepatic steatosis. 3. Mild lumbar spine spondylosis. Electronically Signed   By: Mitzi Hansen M.D.   On: 11/13/2017 04:48  Procedures Procedures (including critical care time)  Medications Ordered in ED Medications  sodium chloride 0.9 % bolus 500 mL (0 mLs Intravenous Stopped 11/13/17 0452)  morphine 4 MG/ML injection 4 mg (4 mg Intravenous Given 11/13/17 0348)  ondansetron (ZOFRAN) injection 4 mg (4 mg Intravenous Given 11/13/17 0348)  iopamidol (ISOVUE-300) 61 % injection (100 mLs  Contrast Given 11/13/17 0400)     Initial Impression / Assessment and Plan / ED Course  I have reviewed the triage vital signs and the nursing notes.  Pertinent labs & imaging results that were available during my care of the patient were reviewed by me and considered in my medical decision making (see chart for details).     Patient presents to the emergency department for evaluation of abdominal pain that has been ongoing for several days followed by onset of back pain.  Back pain is reproducible with palpation and movement, seems musculoskeletal in nature.  Abdominal exam revealed diffuse abdominal tenderness.  He did not have any guarding or signs of peritonitis.  He did have a leukocytosis but no fever.  Patient therefore underwent CT abdomen and pelvis for further evaluation.  No abnormality is noted.  Patient is  now eating and drinking without any difficulty.  He will be discharged, follow-up with primary care doctor.  Final Clinical Impressions(s) / ED Diagnoses   Final diagnoses:  Generalized abdominal pain    ED Discharge Orders    None       Pollina, Canary Brim, MD 11/13/17 [phone]

## 2017-11-13 NOTE — ED Notes (Signed)
Patient given a [place] sandwich, apple sauce, graham crackers and peanut butter with a sprite zero to drink.

## 2017-11-30 ENCOUNTER — Encounter (HOSPITAL_COMMUNITY): Payer: Self-pay | Admitting: *Deleted

## 2017-11-30 ENCOUNTER — Emergency Department (HOSPITAL_COMMUNITY)
Admission: EM | Admit: 2017-11-30 | Discharge: 2017-11-30 | Disposition: A | Discharge status: Home / Self Care | Payer: Self-pay | Attending: Emergency Medicine | Admitting: Emergency Medicine

## 2017-11-30 ENCOUNTER — Other Ambulatory Visit: Payer: Self-pay

## 2017-11-30 DIAGNOSIS — G8929 Other chronic pain: Secondary | ICD-10-CM | POA: Insufficient documentation

## 2017-11-30 DIAGNOSIS — M25562 Pain in left knee: Secondary | ICD-10-CM | POA: Insufficient documentation

## 2017-11-30 DIAGNOSIS — Z96651 Presence of right artificial knee joint: Secondary | ICD-10-CM | POA: Insufficient documentation

## 2017-11-30 DIAGNOSIS — M25561 Pain in right knee: Secondary | ICD-10-CM | POA: Insufficient documentation

## 2017-11-30 DIAGNOSIS — I10 Essential (primary) hypertension: Secondary | ICD-10-CM | POA: Insufficient documentation

## 2017-11-30 DIAGNOSIS — Z7984 Long term (current) use of oral hypoglycemic drugs: Secondary | ICD-10-CM | POA: Insufficient documentation

## 2017-11-30 DIAGNOSIS — M179 Osteoarthritis of knee, unspecified: Secondary | ICD-10-CM | POA: Insufficient documentation

## 2017-11-30 DIAGNOSIS — M170 Bilateral primary osteoarthritis of knee: Secondary | ICD-10-CM

## 2017-11-30 DIAGNOSIS — Z79899 Other long term (current) drug therapy: Secondary | ICD-10-CM | POA: Insufficient documentation

## 2017-11-30 DIAGNOSIS — E119 Type 2 diabetes mellitus without complications: Secondary | ICD-10-CM | POA: Insufficient documentation

## 2017-11-30 MED ORDER — TRAMADOL HCL 50 MG PO TABS: 50.0000 mg | ORAL_TABLET | Freq: Every evening | ORAL | 0 refills | Status: DC | PRN

## 2017-11-30 NOTE — Discharge Instructions (Signed)
Tramadol before bed as needed for pain. Call Dr. Roda ShuttersXu for appointment

## 2017-11-30 NOTE — ED Provider Notes (Signed)
[facility] Provider Note   CSN: [id number] Arrival date & time: 11/30/17  [id number]     History   Chief Complaint Chief Complaint  Patient presents with  . Knee Pain    HPI David Dunlap is a 47 y.o. male. Complaint is knee pain.  HPI 47 year old male.  History of known osteoarthritis.  Status post right TKA.  Known significant left OA.  Follows with Dr. Leighton RoachMichaelXu.  Is on Xarelto for DVTs.  Cannot take NSAIDs.  Progressive pain over the last few weeks.  No injury.  No fever.  No redness.  No other areas of joint pain.  No history of gout, or other acute arthropathies.  Past Medical History:  Diagnosis Date  . Arthritis   . Depression   . Diabetes mellitus without complication (HCC) 05/2016   NEW ONSET    WITH FAMILY HISTORY  . DVT (deep venous thrombosis) (HCC)   . Essential hypertension   . Gallstones 05/2016  . GERD (gastroesophageal reflux disease)   . Sickle cell anemia [facility](HCC)     Patient Active Problem List   Diagnosis Date Noted  . Obstructive sleep apnea 09/05/2017  . Total knee replacement status 04/17/2017  . GERD (gastroesophageal reflux disease) 11/27/2016  . Unilateral primary osteoarthritis, right knee 10/18/2016  . Neuropathy 07/25/2016  . Acute cholecystitis 06/05/2016  . Diabetes mellitus without complication (HCC) 05/18/2016  . Cholelithiasis 04/09/2016  . Major depressive disorder, single episode 04/09/2016  . Leg DVT (deep venous thromboembolism), chronic (HCC) 12/08/2014  . Family history of diabetes mellitus (DM) 12/08/2014  . Moderate major depression, single episode (HCC) 08/22/2011  . Constipation 11/22/2010  . ANKLE EDEMA 10/19/2010  . Essential hypertension 10/12/2010    Past Surgical History:  Procedure Laterality Date  . CHOLECYSTECTOMY N/A 06/07/2016   Procedure: LAPAROSCOPIC CHOLECYSTECTOMY WITH ATTEMPTED INTRAOPERATIVE CHOLANGIOGRAM;  Surgeon: Violeta GelinasBurke Thompson, MD;  Location: [facility] OR;  Service:  General;  Laterality: N/A;  . LIGAMENT REPAIR     R forearm  . TOTAL KNEE ARTHROPLASTY Right 04/17/2017   Procedure: RIGHT TOTAL KNEE ARTHROPLASTY;  Surgeon: Tarry KosXu, Naiping M, MD;  Location: [facility] OR;  Service: Orthopedics;  Laterality: Right;       Home Medications    Prior to Admission medications   Medication Sig Start Date End Date Taking? Authorizing Provider  atorvastatin (LIPITOR) 20 MG tablet Take 1 tablet (20 mg total) by mouth daily. 09/04/17   Hoy RegisterNewlin, Enobong, MD  Blood Glucose Monitoring Suppl (TRUE METRIX METER) DEVI 1 each by Does not apply route 3 (three) times daily before meals. 07/25/16   Hoy RegisterNewlin, Enobong, MD  cetirizine (ZYRTEC) 10 MG tablet Take 1 tablet (10 mg total) by mouth daily. 03/11/17   Hoy RegisterNewlin, Enobong, MD  dicyclomine (BENTYL) 20 MG tablet Take 1 tablet (20 mg total) by mouth 2 (two) times daily. 11/04/17   Kirichenko, Lemont Fillersatyana, PA-C  diphenoxylate-atropine (LOMOTIL) 2.5-0.025 MG tablet Take 1 tablet by mouth 4 (four) times daily as needed for diarrhea or loose stools. 11/13/17   Gilda CreasePollina, Christopher J, MD  famotidine (PEPCID) 20 MG tablet Take 20 mg by mouth daily as needed for heartburn or indigestion.    [provider]  FLUoxetine (PROZAC) 20 MG tablet Take 2 tablets (40 mg total) by mouth daily. 09/04/17   Hoy RegisterNewlin, Enobong, MD  gabapentin (NEURONTIN) 300 MG capsule Take 1 capsule (300 mg total) by mouth 3 (three) times daily. 09/04/17   Hoy RegisterNewlin, Enobong, MD  glipiZIDE (GLUCOTROL) 5 MG tablet  Take 1 tablet (5 mg total) by mouth 2 (two) times daily before a meal. 09/04/17   Hoy Register, MD  glucose blood (TRUE METRIX BLOOD GLUCOSE TEST) test strip Used daily before meals 07/25/16   Hoy Register, MD  hydrochlorothiazide (HYDRODIURIL) 25 MG tablet Take 1 tablet (25 mg total) by mouth daily. 09/04/17   Hoy Register, MD  lisinopril (PRINIVIL,ZESTRIL) 10 MG tablet Take 1 tablet (10 mg total) by mouth daily. 09/04/17   Hoy Register, MD  metFORMIN (GLUCOPHAGE)  500 MG tablet Take 2 tablets (1,000 mg total) by mouth 2 (two) times daily with a meal. Patient taking differently: Take 500 mg by mouth 2 (two) times daily with a meal.  09/04/17   Newlin, Enobong, MD  ondansetron (ZOFRAN) 4 MG tablet Take 1 tablet (4 mg total) by mouth every 6 (six) hours. 11/04/17   Kirichenko, Lemont Fillers, PA-C  rivaroxaban (XARELTO) 20 MG TABS tablet Take 1 tablet (20 mg total) by mouth daily with supper. Patient taking differently: Take 20 mg by mouth daily with breakfast.  09/04/17   Hoy Register, MD  traMADol (ULTRAM) 50 MG tablet Take 1 tablet (50 mg total) by mouth at bedtime as needed. 11/30/17   Rolland Porter, MD  TRUEPLUS LANCETS 28G MISC Use daily before meals 07/25/16   Hoy Register, MD    Family History Family History  Problem Relation Age of Onset  . Diabetes Mother   . Diabetes Father   . Hypertension Sister     Social History Social History   Tobacco Use  . Smoking status: Never Smoker  . Smokeless tobacco: Never Used  Substance Use Topics  . Alcohol use: Yes    Alcohol/week: 0.6 - 1.2 oz    Types: 1 - 2 Cans of beer per week    Comment: doesn't drink all the drink  . Drug use: No    Comment: last time 3 months ago ( back in 1989)     Allergies   Patient has no known allergies.   Review of Systems Review of Systems  Constitutional: Negative for appetite change, chills, diaphoresis, fatigue and fever.  HENT: Negative for mouth sores, sore throat and trouble swallowing.   Eyes: Negative for visual disturbance.  Respiratory: Negative for cough, chest tightness, shortness of breath and wheezing.   Cardiovascular: Negative for chest pain.  Gastrointestinal: Negative for abdominal distention, abdominal pain, diarrhea, nausea and vomiting.  Endocrine: Negative for polydipsia, polyphagia and polyuria.  Genitourinary: Negative for dysuria, frequency and hematuria.  Musculoskeletal: Positive for arthralgias. Negative for gait problem.  Skin:  Negative for color change, pallor and rash.  Neurological: Negative for dizziness, syncope, light-headedness and headaches.  Hematological: Does not bruise/bleed easily.  Psychiatric/Behavioral: Negative for behavioral problems and confusion.     Physical Exam Updated Vital Signs BP 124/84   Pulse 80   Temp 98.3 F (36.8 C)   Resp 17   Ht 5\' 9"  (1.753 m)   Wt 129.3 kg (285 lb)   SpO2 99%   BMI 42.09 kg/m   Physical Exam  Constitutional: He is oriented to person, place, and time. He appears well-developed and well-nourished. No distress.  HENT:  Head: Normocephalic.  Eyes: Conjunctivae are normal. Pupils are equal, round, and reactive to light. No scleral icterus.  Neck: Normal range of motion. Neck supple. No thyromegaly present.  Cardiovascular: Normal rate and regular rhythm. Exam reveals no gallop and no friction rub.  No murmur heard. Pulmonary/Chest: Effort normal and breath sounds normal. No  respiratory distress. He has no wheezes. He has no rales.  Abdominal: Soft. Bowel sounds are normal. He exhibits no distension. There is no tenderness. There is no rebound.  Musculoskeletal: Normal range of motion.  Full range of motion.  No effusions to the knees.  No specifically medial or lateral joint line tenderness.  Neurological: He is alert and oriented to person, place, and time.  Skin: Skin is warm and dry. No rash noted.  Psychiatric: He has a normal mood and affect. His behavior is normal.     ED Treatments / Results  Labs (all labs ordered are listed, but only abnormal results are displayed) Labs Reviewed - No data to display  EKG  EKG Interpretation None       Radiology No results found.  Procedures Procedures (including critical care time)  Medications Ordered in ED Medications - No data to display   Initial Impression / Assessment and Plan / ED Course  I have reviewed the triage vital signs and the nursing notes.  Pertinent labs & imaging  results that were available during my care of the patient were reviewed by me and considered in my medical decision making (see chart for details).    I have encouraged him to follow-up with his orthopedist.  Will offer tramadol and a short course to take at night only.  Tylenol or ice as needed for acute episodes.  Final Clinical Impressions(s) / ED Diagnoses   Final diagnoses:  Chronic pain of both knees  Osteoarthritis of both knees, unspecified osteoarthritis type    ED Discharge Orders        Ordered    traMADol (ULTRAM) 50 MG tablet  At bedtime PRN     11/30/17 0728       Rolland Porter, MD 11/30/17 [phone]

## 2017-11-30 NOTE — ED Triage Notes (Signed)
Bi-lateral, knee pain all day  No known injury

## 2017-12-04 ENCOUNTER — Encounter: Payer: Self-pay | Admitting: Family Medicine

## 2017-12-04 ENCOUNTER — Ambulatory Visit: Payer: Self-pay | Attending: Family Medicine | Admitting: Family Medicine

## 2017-12-04 VITALS — BP 129/88 | HR 79 | Temp 97.4°F | Ht 69.0 in | Wt 284.8 lb

## 2017-12-04 DIAGNOSIS — Z7984 Long term (current) use of oral hypoglycemic drugs: Secondary | ICD-10-CM | POA: Insufficient documentation

## 2017-12-04 DIAGNOSIS — N528 Other male erectile dysfunction: Secondary | ICD-10-CM

## 2017-12-04 DIAGNOSIS — N529 Male erectile dysfunction, unspecified: Secondary | ICD-10-CM | POA: Insufficient documentation

## 2017-12-04 DIAGNOSIS — I82599 Chronic embolism and thrombosis of other specified deep vein of unspecified lower extremity: Secondary | ICD-10-CM | POA: Insufficient documentation

## 2017-12-04 DIAGNOSIS — Z9889 Other specified postprocedural states: Secondary | ICD-10-CM | POA: Insufficient documentation

## 2017-12-04 DIAGNOSIS — E1169 Type 2 diabetes mellitus with other specified complication: Secondary | ICD-10-CM

## 2017-12-04 DIAGNOSIS — G4733 Obstructive sleep apnea (adult) (pediatric): Secondary | ICD-10-CM | POA: Insufficient documentation

## 2017-12-04 DIAGNOSIS — Z7902 Long term (current) use of antithrombotics/antiplatelets: Secondary | ICD-10-CM | POA: Insufficient documentation

## 2017-12-04 DIAGNOSIS — K219 Gastro-esophageal reflux disease without esophagitis: Secondary | ICD-10-CM | POA: Insufficient documentation

## 2017-12-04 DIAGNOSIS — Z79899 Other long term (current) drug therapy: Secondary | ICD-10-CM | POA: Insufficient documentation

## 2017-12-04 DIAGNOSIS — Z96651 Presence of right artificial knee joint: Secondary | ICD-10-CM | POA: Insufficient documentation

## 2017-12-04 DIAGNOSIS — E119 Type 2 diabetes mellitus without complications: Secondary | ICD-10-CM

## 2017-12-04 DIAGNOSIS — E1140 Type 2 diabetes mellitus with diabetic neuropathy, unspecified: Secondary | ICD-10-CM | POA: Insufficient documentation

## 2017-12-04 DIAGNOSIS — D571 Sickle-cell disease without crisis: Secondary | ICD-10-CM | POA: Insufficient documentation

## 2017-12-04 DIAGNOSIS — F322 Major depressive disorder, single episode, severe without psychotic features: Secondary | ICD-10-CM | POA: Insufficient documentation

## 2017-12-04 DIAGNOSIS — I10 Essential (primary) hypertension: Secondary | ICD-10-CM | POA: Insufficient documentation

## 2017-12-04 DIAGNOSIS — Z9049 Acquired absence of other specified parts of digestive tract: Secondary | ICD-10-CM | POA: Insufficient documentation

## 2017-12-04 DIAGNOSIS — I82509 Chronic embolism and thrombosis of unspecified deep veins of unspecified lower extremity: Secondary | ICD-10-CM

## 2017-12-04 DIAGNOSIS — G629 Polyneuropathy, unspecified: Secondary | ICD-10-CM

## 2017-12-04 LAB — POCT GLYCOSYLATED HEMOGLOBIN (HGB A1C): Hemoglobin A1C: 7.7

## 2017-12-04 LAB — GLUCOSE, POCT (MANUAL RESULT ENTRY): POC GLUCOSE: 126 mg/dL — AB (ref 70–99)

## 2017-12-04 MED ORDER — ATORVASTATIN CALCIUM 20 MG PO TABS: 20.0000 mg | ORAL_TABLET | Freq: Every day | ORAL | 5 refills | Status: DC

## 2017-12-04 MED ORDER — GABAPENTIN 300 MG PO CAPS: 300.0000 mg | ORAL_CAPSULE | Freq: Three times a day (TID) | ORAL | 5 refills | Status: DC

## 2017-12-04 MED ORDER — HYDROCHLOROTHIAZIDE 25 MG PO TABS: 25.0000 mg | ORAL_TABLET | Freq: Every day | ORAL | 5 refills | Status: DC

## 2017-12-04 MED ORDER — METFORMIN HCL 500 MG PO TABS: 1000.0000 mg | ORAL_TABLET | Freq: Two times a day (BID) | ORAL | 5 refills | Status: DC

## 2017-12-04 MED ORDER — LISINOPRIL 10 MG PO TABS: 10.0000 mg | ORAL_TABLET | Freq: Every day | ORAL | 5 refills | Status: DC

## 2017-12-04 MED ORDER — FLUOXETINE HCL 20 MG PO TABS: 40.0000 mg | ORAL_TABLET | Freq: Every day | ORAL | 5 refills | Status: DC

## 2017-12-04 MED ORDER — GLIPIZIDE 5 MG PO TABS: 5.0000 mg | ORAL_TABLET | Freq: Two times a day (BID) | ORAL | 5 refills | Status: DC

## 2017-12-04 MED ORDER — SILDENAFIL CITRATE 50 MG PO TABS: 50.0000 mg | ORAL_TABLET | Freq: Every day | ORAL | 2 refills | Status: DC | PRN

## 2017-12-04 MED FILL — traMADol HCL 50 MG TABS: 50 | 15 days supply | Qty: 15 | Fill #0

## 2017-12-04 NOTE — Patient Instructions (Signed)

## 2017-12-04 NOTE — Progress Notes (Signed)
Subjective:  Patient ID: David Dunlap, male    DOB: 01/28/1971  Age: 47 y.o. MRN: [id number]  CC: Diabetes; Hypertension; and Erectile Dysfunction   HPI [name] is a  47 year old male with a history of type 2 diabetes mellitus (A1c 7.7) hypertension, chronic PE, depression, right total knee replacement in 04/2017 obstructive sleep apnea who presents today for a follow-up visit.  He complains of bilateral knee pain and swelling of his right knee for which he has had ED visits in the last 2 months one of which was after a fall in 09/2017.  Right knee x-ray at the time revealed no acute abnormality. He has an upcoming appointment with his orthopedic in 5 days.  Tramadol was prescribed by the ED and he will be picking that up from the pharmacy today. As per the patient he was informed he would eventually need a left knee replacement.  He complains of erectile dysfunction  over the last few months and is requesting treatment for this.  With regards to his diabetes he has been compliant with his medications and tolerates his antihypertensives with no adverse effects.  His neuropathy is controlled; he denies visual symptoms or hypoglycemia. He denies bruising or GI bleed with Xarelto.  Past Medical History:  Diagnosis Date  . Arthritis   . Depression   . Diabetes mellitus without complication ([place]) 29/5284   NEW ONSET    WITH FAMILY HISTORY  . DVT (deep venous thrombosis) ([place])   . Essential hypertension   . Gallstones 05/2016  . GERD (gastroesophageal reflux disease)   . Sickle cell anemia (HCC)     Past Surgical History:  Procedure Laterality Date  . CHOLECYSTECTOMY N/A 06/07/2016   Procedure: LAPAROSCOPIC CHOLECYSTECTOMY WITH ATTEMPTED INTRAOPERATIVE CHOLANGIOGRAM;  Surgeon: Georganna Skeans, MD;  Location: [facility];  Service: General;  Laterality: N/A;  . LIGAMENT REPAIR     R forearm  . TOTAL KNEE ARTHROPLASTY Right 04/17/2017   Procedure: RIGHT TOTAL KNEE ARTHROPLASTY;   Surgeon: Leandrew Koyanagi, MD;  Location: [facility];  Service: Orthopedics;  Laterality: Right;    No Known Allergies   Outpatient Medications Prior to Visit  Medication Sig Dispense Refill  . Blood Glucose Monitoring Suppl (TRUE METRIX METER) DEVI 1 each by Does not apply route 3 (three) times daily before meals. 1 Device 0  . cetirizine (ZYRTEC) 10 MG tablet Take 1 tablet (10 mg total) by mouth daily. 30 tablet 1  . dicyclomine (BENTYL) 20 MG tablet Take 1 tablet (20 mg total) by mouth 2 (two) times daily. 20 tablet 0  . diphenoxylate-atropine (LOMOTIL) 2.5-0.025 MG tablet Take 1 tablet by mouth 4 (four) times daily as needed for diarrhea or loose stools. 20 tablet 0  . famotidine (PEPCID) 20 MG tablet Take 20 mg by mouth daily as needed for heartburn or indigestion.    Marland Kitchen glucose blood (TRUE METRIX BLOOD GLUCOSE TEST) test strip Used daily before meals 100 each 12  . ondansetron (ZOFRAN) 4 MG tablet Take 1 tablet (4 mg total) by mouth every 6 (six) hours. 12 tablet 0  . rivaroxaban (XARELTO) 20 MG TABS tablet Take 1 tablet (20 mg total) by mouth daily with supper. (Patient taking differently: Take 20 mg by mouth daily with breakfast. ) 90 tablet 3  . traMADol (ULTRAM) 50 MG tablet Take 1 tablet (50 mg total) by mouth at bedtime as needed. 15 tablet 0  . TRUEPLUS LANCETS 28G MISC Use daily before meals 30 each 12  .  atorvastatin (LIPITOR) 20 MG tablet Take 1 tablet (20 mg total) by mouth daily. 30 tablet 5  . FLUoxetine (PROZAC) 20 MG tablet Take 2 tablets (40 mg total) by mouth daily. 60 tablet 5  . gabapentin (NEURONTIN) 300 MG capsule Take 1 capsule (300 mg total) by mouth 3 (three) times daily. 90 capsule 5  . glipiZIDE (GLUCOTROL) 5 MG tablet Take 1 tablet (5 mg total) by mouth 2 (two) times daily before a meal. 60 tablet 5  . hydrochlorothiazide (HYDRODIURIL) 25 MG tablet Take 1 tablet (25 mg total) by mouth daily. 30 tablet 5  . lisinopril (PRINIVIL,ZESTRIL) 10 MG tablet Take 1 tablet (10 mg  total) by mouth daily. 30 tablet 5  . metFORMIN (GLUCOPHAGE) 500 MG tablet Take 2 tablets (1,000 mg total) by mouth 2 (two) times daily with a meal. (Patient taking differently: Take 500 mg by mouth 2 (two) times daily with a meal. ) 120 tablet 5   No facility-administered medications prior to visit.     ROS Review of Systems  Constitutional: Negative for activity change and appetite change.  HENT: Negative for sinus pressure and sore throat.   Eyes: Negative for visual disturbance.  Respiratory: Negative for cough, chest tightness and shortness of breath.   Cardiovascular: Negative for chest pain and leg swelling.  Gastrointestinal: Negative for abdominal distention, abdominal pain, constipation and diarrhea.  Endocrine: Negative.   Genitourinary: Negative for dysuria.  Musculoskeletal:       See hpi  Skin: Negative for rash.  Allergic/Immunologic: Negative.   Neurological: Negative for weakness, light-headedness and numbness.  Psychiatric/Behavioral: Negative for dysphoric mood and suicidal ideas.    Objective:  BP 129/88   Pulse 79   Temp (!) 97.4 F (36.3 C) (Oral)   Ht _0  (1.753 m)   Wt 284 lb 12.8 oz (129.2 kg)   SpO2 91%   BMI 42.06 kg/m   BP/Weight 12/04/2017 11/30/2017 0/80/2233  Systolic BP 612 244 975  Diastolic BP 88 84 77  Wt. (Lbs) 284.8 285 -  BMI 42.06 42.09 42.09      Physical Exam Constitutional: normal appearing,  Eyes: PERRLA HEENT: Head is atraumatic, normal sinuses, normal oropharynx, normal appearing tonsils and palate, tympanic membrane is normal bilaterally. Neck: normal range of motion, no thyromegaly, no JVD Cardiovascular: normal rate and rhythm, normal heart sounds, no murmurs, rub or gallop, no pedal edema Respiratory: clear to auscultation bilaterally, no wheezes, no rales, no rhonchi Abdomen: soft, not tender to palpation, normal bowel sounds, no enlarged organs Extremities: Healed vertical surgical scar in the right knee, right  knee edema.  Crepitus some slight tenderness on range of motion of both knees Skin: warm and dry, no lesions. Neurological: alert, oriented x3, cranial nerves I-XII grossly intact , normal motor strength, normal sensation. Psychological: normal mood.   CMP Latest Ref Rng & Units 11/12/2017 11/03/2017 06/12/2017  Glucose 65 - 99 mg/dL 186(H) 177(H) 163(H)  BUN 6 - 20 mg/dL _1 Creatinine 0.61 - 1.24 mg/dL 1.16 1.06 1.07  Sodium 135 - 145 mmol/L 135 137 141  Potassium 3.5 - 5.1 mmol/L 4.3 4.5 4.3  Chloride 101 - 111 mmol/L 101 105 100  CO2 22 - 32 mmol/L _2 Calcium 8.9 - 10.3 mg/dL 9.0 9.3 9.3  Total Protein 6.5 - 8.1 g/dL 7.2 7.0 7.1  Total Bilirubin 0.3 - 1.2 mg/dL 0.7 0.8 0.3  Alkaline Phos 38 - 126 U/L 91 82 101  AST 15 -  41 U/L _0 ALT 17 - 63 U/L 29 32 22    Lipid Panel     Component Value Date/Time   CHOL 136 06/12/2017 0943   TRIG 111 06/12/2017 0943   HDL 37 (L) 06/12/2017 0943   CHOLHDL 3.7 06/12/2017 0943   CHOLHDL 4.6 12/08/2014 0942   VLDL 24 12/08/2014 0942   LDLCALC 77 06/12/2017 0943     Lab Results  Component Value Date   HGBA1C 7.7 12/04/2017    Assessment & Plan:   1. Type 2 diabetes mellitus with other specified complication, without long-term current use of insulin (HCC) A1c of 7.7- not fully optimized Consider increasing glipizide dose at next visit if A1c trends up Diabetic diet, lifestyle modification - POCT glucose (manual entry) - POCT glycosylated hemoglobin (Hb A1C) - glipiZIDE (GLUCOTROL) 5 MG tablet; Take 1 tablet (5 mg total) by mouth 2 (two) times daily before a meal.  Dispense: 60 tablet; Refill: 5 - atorvastatin (LIPITOR) 20 MG tablet; Take 1 tablet (20 mg total) by mouth daily.  Dispense: 30 tablet; Refill: 5 - metFORMIN (GLUCOPHAGE) 500 MG tablet; Take 2 tablets (1,000 mg total) by mouth 2 (two) times daily with a meal.  Dispense: 120 tablet; Refill: 5 - CMP14+EGFR - Lipid panel - Microalbumin/Creatinine Ratio,  Urine  2. Severe single current episode of major depressive disorder, without psychotic features (HCC) Stable - FLUoxetine (PROZAC) 20 MG tablet; Take 2 tablets (40 mg total) by mouth daily.  Dispense: 60 tablet; Refill: 5  3. Neuropathy Controlled - gabapentin (NEURONTIN) 300 MG capsule; Take 1 capsule (300 mg total) by mouth 3 (three) times daily.  Dispense: 90 capsule; Refill: 5  4. Essential hypertension Controlled - hydrochlorothiazide (HYDRODIURIL) 25 MG tablet; Take 1 tablet (25 mg total) by mouth daily.  Dispense: 30 tablet; Refill: 5 - lisinopril (PRINIVIL,ZESTRIL) 10 MG tablet; Take 1 tablet (10 mg total) by mouth daily.  Dispense: 30 tablet; Refill: 5  5. 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 Status post total right knee replacement Persisting right knee pain now with left knee symptoms Might need left knee replacement as well Has upcoming appointment with his orthopedic in 5 days  7. Other male erectile dysfunction - sildenafil (VIAGRA) 50 MG tablet; Take 1 tablet (50 mg total) by mouth daily as needed for erectile dysfunction. At least 24 hours between doses  Dispense: 30 tablet; Refill: 2   Meds ordered this encounter  Medications  . sildenafil (VIAGRA) 50 MG tablet    Sig: Take 1 tablet (50 mg total) by mouth daily as needed for erectile dysfunction. At least 24 hours between doses    Dispense:  30 tablet    Refill:  2  . glipiZIDE (GLUCOTROL) 5 MG tablet    Sig: Take 1 tablet (5 mg total) by mouth 2 (two) times daily before a meal.    Dispense:  60 tablet    Refill:  5  . FLUoxetine (PROZAC) 20 MG tablet    Sig: Take 2 tablets (40 mg total) by mouth daily.    Dispense:  60 tablet    Refill:  5  . gabapentin (NEURONTIN) 300 MG capsule    Sig: Take 1 capsule (300 mg total) by mouth 3 (three) times daily.    Dispense:  90 capsule    Refill:  5  . hydrochlorothiazide  (HYDRODIURIL) 25 MG tablet    Sig: Take  1 tablet (25 mg total) by mouth daily.    Dispense:  30 tablet    Refill:  5  . atorvastatin (LIPITOR) 20 MG tablet    Sig: Take 1 tablet (20 mg total) by mouth daily.    Dispense:  30 tablet    Refill:  5  . lisinopril (PRINIVIL,ZESTRIL) 10 MG tablet    Sig: Take 1 tablet (10 mg total) by mouth daily.    Dispense:  30 tablet    Refill:  5  . metFORMIN (GLUCOPHAGE) 500 MG tablet    Sig: Take 2 tablets (1,000 mg total) by mouth 2 (two) times daily with a meal.    Dispense:  120 tablet    Refill:  5    Follow-up: Return in about 3 months (around 03/06/2018) for Follow-up of chronic medical conditions.   Charlott Rakes MD

## 2017-12-05 ENCOUNTER — Telehealth: Payer: Self-pay

## 2017-12-05 LAB — CMP14+EGFR
A/G RATIO: 1.5 (ref 1.2–2.2)
ALK PHOS: 90 IU/L (ref 39–117)
ALT: 37 IU/L (ref 0–44)
AST: 10 IU/L (ref 0–40)
Albumin: 4.5 g/dL (ref 3.5–5.5)
BUN/Creatinine Ratio: 11 (ref 9–20)
BUN: 12 mg/dL (ref 6–24)
Bilirubin Total: 0.7 mg/dL (ref 0.0–1.2)
CALCIUM: 9.4 mg/dL (ref 8.7–10.2)
CHLORIDE: 96 mmol/L (ref 96–106)
CO2: 25 mmol/L (ref 20–29)
Creatinine, Ser: 1.06 mg/dL (ref 0.76–1.27)
GFR calc Af Amer: 95 mL/min/{1.73_m2} (ref >59)
GFR, EST NON AFRICAN AMERICAN: 83 mL/min/{1.73_m2} (ref >59)
Globulin, Total: 3 g/dL (ref 1.5–4.5)
Glucose: 99 mg/dL (ref 65–99)
POTASSIUM: 4.6 mmol/L (ref 3.5–5.2)
SODIUM: 136 mmol/L (ref 134–144)
Total Protein: 7.5 g/dL (ref 6.0–8.5)

## 2017-12-05 LAB — LIPID PANEL
CHOLESTEROL TOTAL: 121 mg/dL (ref 100–199)
Chol/HDL Ratio: 3.2 ratio (ref 0.0–5.0)
HDL: 38 mg/dL — AB (ref >39)
LDL Calculated: 61 mg/dL (ref 0–99)
TRIGLYCERIDES: 108 mg/dL (ref 0–149)
VLDL Cholesterol Cal: 22 mg/dL (ref 5–40)

## 2017-12-05 NOTE — Telephone Encounter (Signed)
Patient was called and informed of normal lab results. 

## 2017-12-09 ENCOUNTER — Ambulatory Visit (INDEPENDENT_AMBULATORY_CARE_PROVIDER_SITE_OTHER): Payer: Self-pay | Admitting: Orthopaedic Surgery

## 2017-12-09 ENCOUNTER — Ambulatory Visit (INDEPENDENT_AMBULATORY_CARE_PROVIDER_SITE_OTHER): Payer: Self-pay

## 2017-12-09 ENCOUNTER — Encounter (INDEPENDENT_AMBULATORY_CARE_PROVIDER_SITE_OTHER): Payer: Self-pay | Admitting: Orthopaedic Surgery

## 2017-12-09 DIAGNOSIS — M25562 Pain in left knee: Secondary | ICD-10-CM

## 2017-12-09 DIAGNOSIS — Z96651 Presence of right artificial knee joint: Secondary | ICD-10-CM

## 2017-12-09 NOTE — Progress Notes (Signed)
Post-Op Visit Note   Patient: David Dunlap           Date of Birth: 1970/11/21           MRN: [id number] Visit Date: 12/09/2017 PCP: Hoy RegisterNewlin, Enobong, MD   Assessment & Plan:  Chief Complaint:  Chief Complaint  Patient presents with  . Left Knee - Pain  . Right Knee - Pain   Visit Diagnoses:  1. S/P TKR (total knee replacement), right     Plan: He comes in for follow-up.  8 months status post right press-fit total knee replacement, date of surgery 04/17/2017.  He has been complaining of pain since surgery.  This is gradually, but slowly improving.  The pain he has is to the entire knee.  He describes this is constant sharp pains worse with walking as well as going up and down stairs.  He does note that he has night pain as well.  He was seen in the ED recently where he was given tramadol.  This minimally improves his pain.  No numbness tingling burning.  No new injury or change in activity.  Examination of the right knee shows well healed surgical incision without evidence of infection.  Range of motion from 0-125 degrees.  He is stable valgus and varus stress.  No patella tracking.  He does have tenderness to the medial and lateral joint line as well as lateral patella facet. no calf pain.  Of note, he is on Xarelto for previous blood clots and is unable to take anti-inflammatories.  On previous x-rays from January, he had some increased lucency to the medial tibial plateau.  This has improved based on today's x-ray.  He will continue working on strengthening the right knee.  He will follow-up with us in 4 months time.  If he continues to have pain to the lateral patella facet, we may propose surgical intervention to remove the spur and increase the size of the component.  He will call with concerns or questions in the meantime.  Follow-Up Instructions: Return in about 4 months (around 04/10/2018).   Orders:  Orders Placed This Encounter  Procedures  . XR KNEE 3 VIEW RIGHT  . XR  KNEE 3 VIEW LEFT   No orders of the defined types were placed in this encounter.   Imaging: Xr Knee 3 View Left  Result Date: 12/09/2017 Marked bone-on-bone medial patellofemoral compartments with significant peritubular spurring  Xr Knee 3 View Right  Result Date: 12/09/2017 Well-seated prosthesis with lateral facet osteophyte   PMFS History: Patient Active Problem List   Diagnosis Date Noted  . Obstructive sleep apnea 09/05/2017  . S/P TKR (total knee replacement), right 04/17/2017  . GERD (gastroesophageal reflux disease) 11/27/2016  . Unilateral primary osteoarthritis, right knee 10/18/2016  . Neuropathy 07/25/2016  . Acute cholecystitis 06/05/2016  . Diabetes mellitus without complication (HCC) 05/18/2016  . Cholelithiasis 04/09/2016  . Major depressive disorder, single episode 04/09/2016  . Leg DVT (deep venous thromboembolism), chronic (HCC) 12/08/2014  . Family history of diabetes mellitus (DM) 12/08/2014  . Moderate major depression, single episode (HCC) 08/22/2011  . Constipation 11/22/2010  . ANKLE EDEMA 10/19/2010  . Essential hypertension 10/12/2010   Past Medical History:  Diagnosis Date  . Arthritis   . Depression   . Diabetes mellitus without complication (HCC) 05/2016   NEW ONSET    WITH FAMILY HISTORY  . DVT (deep venous thrombosis) (HCC)   . Essential hypertension   . Gallstones 05/2016  .  GERD (gastroesophageal reflux disease)   . Sickle cell anemia (HCC)     Family History  Problem Relation Age of Onset  . Diabetes Mother   . Diabetes Father   . Hypertension Sister     Past Surgical History:  Procedure Laterality Date  . CHOLECYSTECTOMY N/A 06/07/2016   Procedure: LAPAROSCOPIC CHOLECYSTECTOMY WITH ATTEMPTED INTRAOPERATIVE CHOLANGIOGRAM;  Surgeon: Violeta Gelinas, MD;  [facility];  Service: General;  Laterality: N/A;  . LIGAMENT REPAIR     R forearm  . TOTAL KNEE ARTHROPLASTY Right 04/17/2017   Procedure: RIGHT TOTAL KNEE ARTHROPLASTY;   Surgeon: Tarry Kos, MD;  [facility];  Service: Orthopedics;  Laterality: Right;   Social History   Occupational History  . Not on file  Tobacco Use  . Smoking status: Never Smoker  . Smokeless tobacco: Never Used  Substance and Sexual Activity  . Alcohol use: Yes    Alcohol/week: 0.6 - 1.2 oz    Types: 1 - 2 Cans of beer per week    Comment: doesn't drink all the drink  . Drug use: No    Types: Marijuana    Comment: last time 3 months ago ( back in 1989)  . Sexual activity: Not on file

## 2017-12-18 ENCOUNTER — Telehealth: Payer: Self-pay | Admitting: *Deleted

## 2017-12-18 NOTE — Telephone Encounter (Signed)
Porsha called from the irc and stated the patient would like all of his medications to be sent to the [facility] due to the patient not being able to get to [facility] to get medications.

## 2017-12-18 NOTE — Telephone Encounter (Signed)
[facility] was called and informed that [facility] would have to come get medications or medications can be sent to a nearby pharmacy.

## 2017-12-19 ENCOUNTER — Telehealth: Payer: Self-pay

## 2017-12-19 MED FILL — XARELTO 20 MG TABLET: 20 | 30 days supply | Qty: 30 | Fill #1

## 2017-12-19 MED FILL — GABAPENTIN 300 MG CAPSULE: 300 | 30 days supply | Qty: 90 | Fill #0

## 2017-12-19 MED FILL — HYDROCHLOROTHIAZIDE 25 MG T: 25 | 30 days supply | Qty: 30 | Fill #0

## 2017-12-19 MED FILL — ATORVASTATIN 20 MG TABLET: 20 | 30 days supply | Qty: 30 | Fill #0

## 2017-12-19 MED FILL — glipiZIDE 5 MG TABS: 5 | 30 days supply | Qty: 60 | Fill #0

## 2017-12-19 MED FILL — LISINOPRIL 10 MG TABS: 10 | 30 days supply | Qty: 30 | Fill #0

## 2017-12-19 MED FILL — FLUoxetine HCL 20 MG CAPS: 20 | 30 days supply | Qty: 60 | Fill #0

## 2017-12-19 MED FILL — metFORMIN HCL 500 MG TABS: 500 | 30 days supply | Qty: 120 | Fill #0

## 2017-12-19 NOTE — Telephone Encounter (Signed)
[facility] along with Lattie Hawharlotte Evans, RN/IRC.  They explained that the patient is [facility] and was informed that the cost of the medications is $44 and he does not have $44.  Explained that this CM will check with the pharmacy to see if the patient is allowed to put the medication charges on an account and pay off when he is able.  This CM spoke to [facility] Inchayla Smith, [facility] Pharmacy Tech and Georgiana ShoreLuke Van BurlingtonAusdell, [place] and was informed that the patient has been told multiple times since December 2018, the most recent time being 12/05/17, that he needs to apply for the [facility] and he has not done so. He was told the last time that he picked up medications that it was the " last time" that he would be given the medications without the [employer] Card.  They said that he needs to make some payment towards the $44 charge in order to  receive his medications. The xarelto however is no charge. He also needs to make an appointment for a [employer] Card.   [facility] for 12/30/17 @ 1430.  Call placed to Lattie Hawharlotte Evans, RN and informed her of the response from the pharmacy noting that the patient needs to make a payment towards his medication charges. He can however get the xarelto for no charge.  Claris GowerCharlotte stated that she would inform him of the information from the pharmacy as well as the financial counseling appointment. She also noted that she has given the patient bus passes to get to the clinic as well as the paperwork for the [facility] application.

## 2017-12-19 NOTE — Congregational Nurse Program (Signed)
Congregational Nurse Program Note  Date of Encounter: 12/19/2017  Past Medical History: Past Medical History:  Diagnosis Date  . Arthritis   . Depression   . Diabetes mellitus without complication (HCC) 05/2016   NEW ONSET    WITH FAMILY HISTORY  . DVT (deep venous thrombosis) (HCC)   . Essential hypertension   . Gallstones 05/2016  . GERD (gastroesophageal reflux disease)   . Sickle cell anemia (HCC)     Encounter Details: CNP Questionnaire - 12/19/17 1813      Questionnaire   Patient Status  Not Applicable    Race  Black or African American    Location Patient Served At  Not Applicable    Insurance  Not Applicable    Uninsured  Uninsured (NEW 1x/quarter)    Food  Yes, have food insecurities;Within past 12 months, worried food would run out with no money to buy more;Within past 12 months, food ran out with no money to buy more    Housing/Utilities  No permanent housing    Transportation  Yes, need transportation assistance;Provided transportation assistance (bus pass, taxi voucher, etc.)    Interpersonal Safety  Yes, feel physically and emotionally safe where you currently live    Medication  Yes, have medication insecurities    Medical Provider  Yes    Referrals  [facility];Primary Care Provider/Clinic    ED Visit Averted  Not Applicable    Life-Saving Intervention Made  Not Applicable      Clinical Intake - 12/04/17 1206      Pre-visit preparation   Pre-visit preparation completed  Yes      Pain   Pain   0-10    Pain Score  9     Pain Location  Knee    Pain Orientation  Left;Right      Nutrition Screen   Diabetes  Yes    CBG done?  Yes    CBG resulted in Enter/ Edit results?  Yes    Did pt. bring in CBG monitor from home?  No      Functional Status   Activities of Daily Living  Independent    Ambulation  Independent    Medication Administration  Independent      Abuse/Neglect   Do you feel unsafe in your current relationship?  No    Do you  feel physically threatened by others?  No    Anyone hurting you at home, work, or school?  No    Unable to ask?  No      Web designerLanguage Assistant   Interpreter Needed?  No      Client [facility].  Client states he owes a $46 co-pay and he cannot pay that.  [facility].  Client was informed in December that they would fill the medications without his paying this last time, but he had to come in and complete the financial assistance paperwork.  Client has not done so.  The case manager reported the client needed to "pay something, even if it were just a dollar".  Client was given an appointment to talk with the financial counselor on April 15.  Informed client of above.  [facility].

## 2017-12-30 ENCOUNTER — Ambulatory Visit: Payer: Self-pay

## 2018-01-13 ENCOUNTER — Ambulatory Visit: Payer: Self-pay | Attending: Family Medicine

## 2018-01-13 MED FILL — glipiZIDE 5 MG TABS: 5 | 30 days supply | Qty: 60 | Fill #1

## 2018-01-13 MED FILL — LISINOPRIL 10 MG TABS: 10 | 30 days supply | Qty: 30 | Fill #1

## 2018-01-13 MED FILL — !VIAGRA 50 MG TABLET: 50 MG | 30 days supply | Qty: 10 | Fill #0

## 2018-01-13 MED FILL — metFORMIN HCL 500 MG TABS: 500 | 30 days supply | Qty: 120 | Fill #1

## 2018-01-13 MED FILL — HYDROCHLOROTHIAZIDE 25 MG T: 25 | 30 days supply | Qty: 30 | Fill #1

## 2018-01-30 ENCOUNTER — Emergency Department (HOSPITAL_COMMUNITY): Payer: Self-pay

## 2018-01-30 ENCOUNTER — Other Ambulatory Visit: Payer: Self-pay

## 2018-01-30 ENCOUNTER — Encounter (HOSPITAL_COMMUNITY): Payer: Self-pay | Admitting: Emergency Medicine

## 2018-01-30 ENCOUNTER — Emergency Department (HOSPITAL_COMMUNITY)
Admission: EM | Admit: 2018-01-30 | Discharge: 2018-01-30 | Disposition: A | Discharge status: Home / Self Care | Payer: Self-pay | Attending: Emergency Medicine | Admitting: Emergency Medicine

## 2018-01-30 DIAGNOSIS — M545 Low back pain: Secondary | ICD-10-CM | POA: Insufficient documentation

## 2018-01-30 DIAGNOSIS — M25561 Pain in right knee: Secondary | ICD-10-CM | POA: Insufficient documentation

## 2018-01-30 DIAGNOSIS — I10 Essential (primary) hypertension: Secondary | ICD-10-CM | POA: Insufficient documentation

## 2018-01-30 DIAGNOSIS — M25532 Pain in left wrist: Secondary | ICD-10-CM | POA: Insufficient documentation

## 2018-01-30 DIAGNOSIS — M549 Dorsalgia, unspecified: Secondary | ICD-10-CM

## 2018-01-30 DIAGNOSIS — M25562 Pain in left knee: Secondary | ICD-10-CM | POA: Insufficient documentation

## 2018-01-30 DIAGNOSIS — R51 Headache: Secondary | ICD-10-CM | POA: Insufficient documentation

## 2018-01-30 DIAGNOSIS — Z79899 Other long term (current) drug therapy: Secondary | ICD-10-CM | POA: Insufficient documentation

## 2018-01-30 DIAGNOSIS — Z96651 Presence of right artificial knee joint: Secondary | ICD-10-CM | POA: Insufficient documentation

## 2018-01-30 DIAGNOSIS — M542 Cervicalgia: Secondary | ICD-10-CM | POA: Insufficient documentation

## 2018-01-30 DIAGNOSIS — W19XXXA Unspecified fall, initial encounter: Secondary | ICD-10-CM

## 2018-01-30 DIAGNOSIS — E119 Type 2 diabetes mellitus without complications: Secondary | ICD-10-CM | POA: Insufficient documentation

## 2018-01-30 MED ORDER — HYDROCODONE-ACETAMINOPHEN 5-325 MG PO TABS
2.0000 | ORAL_TABLET | Freq: Once | ORAL | Status: AC
  Administered 2018-01-30: 2 via ORAL
  Filled 2018-01-30: qty 2

## 2018-01-30 MED ORDER — ACETAMINOPHEN 500 MG PO TABS: 500.0000 mg | ORAL_TABLET | Freq: Four times a day (QID) | ORAL | 0 refills | Status: DC | PRN

## 2018-01-30 MED ORDER — CYCLOBENZAPRINE HCL 10 MG PO TABS: 10.0000 mg | ORAL_TABLET | Freq: Two times a day (BID) | ORAL | 0 refills | Status: DC | PRN

## 2018-01-30 NOTE — Progress Notes (Signed)
Orthopedic Tech Progress Note Patient Details:  David Dunlap Sep 09, 1971 [id number]  Ortho Devices Type of Ortho Device: Thumb velcro splint Ortho Device/Splint Location: LUE Ortho Device/Splint Interventions: Ordered, Application   Post Interventions Patient Tolerated: Well Instructions Provided: Care of device   [name] 01/30/2018, 5:32 PM

## 2018-01-30 NOTE — Discharge Instructions (Signed)
Please follow-up with a hand doctor in 7 to 10 days for repeat x-ray to rule out missed fracture.  Wear your splint at all times, except when bathing.  Use Flexeril twice daily as needed for muscle pain or spasms.  You can take Tylenol as needed over-the-counter.  Use ice on your hurting muscle alternating 20 minutes on, 20 minutes off.  Please follow-up with your doctor if your symptoms are not improving.

## 2018-01-30 NOTE — ED Triage Notes (Addendum)
Went to sit down  And chair rolled out from under him yesterday about 430  , hit head on floor and hit his back and left side  And his neck is sore ,  No loc  He states he is able to walk haS 2 YOUNG BOYS WITH HIM

## 2018-01-30 NOTE — ED Provider Notes (Signed)
Patient care transferred at end of shift from [facility], PA-C pending CT head and cervical spine.  Patient presented with a mechanical fall and is on chronic anticoagulant, no LOC.  All other imaging was negative.  Plan to discharge home with symptomatic relief if CT head and neck negative.  CT head and neck without acute abnormality.  He was discharged home with previous stated plan.  Symptomatic relief and close follow-up with Ortho to rule out scaphoid fracture.  Patient is wearing a splint and neurovascularly intact.  Discussed strict return precautions and patient understands and agrees with plan.    Georgiana Shore, PA-C 01/30/18 1846    Tilden Fossa, MD 01/31/18 [phone]

## 2018-01-30 NOTE — ED Provider Notes (Signed)
[facility] EMERGENCY DEPARTMENT Provider Note   CSN: [id number] Arrival date & time: 01/30/18  1341     History   Chief Complaint No chief complaint on file.   HPI David Dunlap is a 47 y.o. male with history of diabetes, DVT anticoagulated on Eliquis, hypertension who presents following fall.  He reports he fell backwards on a chair.  He hit his head on the ground.  He did not lose consciousness, but has had some dizziness since the fall yesterday at 4:30 PM.  He fell back on his left wrist and has also had pain in his neck, right-sided low back, and bilateral knees.  He has not taken any medications at home for his symptoms.  He denies any chest pain, shortness of breath, abdominal pain, nausea, vomiting, numbness or tingling.  He has had mild headache.    HPI  Past Medical History:  Diagnosis Date  . Arthritis   . Depression   . Diabetes mellitus without complication (HCC) 05/2016   NEW ONSET    WITH FAMILY HISTORY  . DVT (deep venous thrombosis) (HCC)   . Essential hypertension   . Gallstones 05/2016  . GERD (gastroesophageal reflux disease)   . Sickle cell anemia [facility])     Patient Active Problem List   Diagnosis Date Noted  . Obstructive sleep apnea 09/05/2017  . S/P TKR (total knee replacement), right 04/17/2017  . GERD (gastroesophageal reflux disease) 11/27/2016  . Unilateral primary osteoarthritis, right knee 10/18/2016  . Neuropathy 07/25/2016  . Acute cholecystitis 06/05/2016  . Diabetes mellitus without complication (HCC) 05/18/2016  . Cholelithiasis 04/09/2016  . Major depressive disorder, single episode 04/09/2016  . Leg DVT (deep venous thromboembolism), chronic (HCC) 12/08/2014  . Family history of diabetes mellitus (DM) 12/08/2014  . Moderate major depression, single episode (HCC) 08/22/2011  . Constipation 11/22/2010  . ANKLE EDEMA 10/19/2010  . Essential hypertension 10/12/2010    Past Surgical History:  Procedure  Laterality Date  . CHOLECYSTECTOMY N/A 06/07/2016   Procedure: LAPAROSCOPIC CHOLECYSTECTOMY WITH ATTEMPTED INTRAOPERATIVE CHOLANGIOGRAM;  Surgeon: Violeta Gelinas, MD;  Location: [facility] OR;  Service: General;  Laterality: N/A;  . LIGAMENT REPAIR     R forearm  . TOTAL KNEE ARTHROPLASTY Right 04/17/2017   Procedure: RIGHT TOTAL KNEE ARTHROPLASTY;  Surgeon: Tarry Kos, MD;  Location: [facility] OR;  Service: Orthopedics;  Laterality: Right;        Home Medications    Prior to Admission medications   Medication Sig Start Date End Date Taking? Authorizing Provider  acetaminophen (TYLENOL) 500 MG tablet Take 1 tablet (500 mg total) by mouth every 6 (six) hours as needed. 01/30/18   Anice Wilshire, Waylan Boga, PA-C  atorvastatin (LIPITOR) 20 MG tablet Take 1 tablet (20 mg total) by mouth daily. 12/04/17   Hoy Register, MD  Blood Glucose Monitoring Suppl (TRUE METRIX METER) DEVI 1 each by Does not apply route 3 (three) times daily before meals. 07/25/16   Hoy Register, MD  cetirizine (ZYRTEC) 10 MG tablet Take 1 tablet (10 mg total) by mouth daily. 03/11/17   Hoy Register, MD  cyclobenzaprine (FLEXERIL) 10 MG tablet Take 1 tablet (10 mg total) by mouth 2 (two) times daily as needed for muscle spasms. 01/30/18   Quanika Solem, Waylan Boga, PA-C  dicyclomine (BENTYL) 20 MG tablet Take 1 tablet (20 mg total) by mouth 2 (two) times daily. 11/04/17   Kirichenko, Tatyana, PA-C  diphenoxylate-atropine (LOMOTIL) 2.5-0.025 MG tablet Take 1 tablet by mouth 4 (four)  times daily as needed for diarrhea or loose stools. 11/13/17   Gilda Crease, MD  famotidine (PEPCID) 20 MG tablet Take 20 mg by mouth daily as needed for heartburn or indigestion.    [provider]  FLUoxetine (PROZAC) 20 MG tablet Take 2 tablets (40 mg total) by mouth daily. 12/04/17   Hoy Register, MD  gabapentin (NEURONTIN) 300 MG capsule Take 1 capsule (300 mg total) by mouth 3 (three) times daily. 12/04/17   Hoy Register, MD  glipiZIDE (GLUCOTROL)  5 MG tablet Take 1 tablet (5 mg total) by mouth 2 (two) times daily before a meal. 12/04/17   Hoy Register, MD  glucose blood (TRUE METRIX BLOOD GLUCOSE TEST) test strip Used daily before meals 07/25/16   Hoy Register, MD  hydrochlorothiazide (HYDRODIURIL) 25 MG tablet Take 1 tablet (25 mg total) by mouth daily. 12/04/17   Hoy Register, MD  lisinopril (PRINIVIL,ZESTRIL) 10 MG tablet Take 1 tablet (10 mg total) by mouth daily. 12/04/17   Hoy Register, MD  metFORMIN (GLUCOPHAGE) 500 MG tablet Take 2 tablets (1,000 mg total) by mouth 2 (two) times daily with a meal. 12/04/17   Newlin, Enobong, MD  ondansetron (ZOFRAN) 4 MG tablet Take 1 tablet (4 mg total) by mouth every 6 (six) hours. 11/04/17   Kirichenko, Lemont Fillers, PA-C  rivaroxaban (XARELTO) 20 MG TABS tablet Take 1 tablet (20 mg total) by mouth daily with supper. Patient taking differently: Take 20 mg by mouth daily with breakfast.  09/04/17   Hoy Register, MD  sildenafil (VIAGRA) 50 MG tablet Take 1 tablet (50 mg total) by mouth daily as needed for erectile dysfunction. At least 24 hours between doses 12/04/17   Hoy Register, MD  traMADol (ULTRAM) 50 MG tablet Take 1 tablet (50 mg total) by mouth at bedtime as needed. 11/30/17   Rolland Porter, MD  TRUEPLUS LANCETS 28G MISC Use daily before meals 07/25/16   Hoy Register, MD    Family History Family History  Problem Relation Age of Onset  . Diabetes Mother   . Diabetes Father   . Hypertension Sister     Social History Social History   Tobacco Use  . Smoking status: Never Smoker  . Smokeless tobacco: Never Used  Substance Use Topics  . Alcohol use: Yes    Alcohol/week: 0.6 - 1.2 oz    Types: 1 - 2 Cans of beer per week    Comment: doesn't drink all the drink  . Drug use: No    Types: Marijuana    Comment: last time 3 months ago ( back in 1989)     Allergies   Patient has no known allergies.   Review of Systems Review of Systems  Constitutional: Negative for chills  and fever.  HENT: Negative for facial swelling and sore throat.   Respiratory: Negative for shortness of breath.   Cardiovascular: Negative for chest pain.  Gastrointestinal: Negative for abdominal pain, nausea and vomiting.  Genitourinary: Negative for dysuria.  Musculoskeletal: Positive for arthralgias, back pain, joint swelling and myalgias.  Skin: Negative for rash and wound.  Neurological: Positive for dizziness and headaches.  Psychiatric/Behavioral: The patient is not nervous/anxious.      Physical Exam Updated Vital Signs BP (!) 168/87 (BP Location: Right Arm)   Pulse 85   Temp 97.9 F (36.6 C) (Oral)   Ht  (1.753 m)   Wt 131.5 kg (290 lb)   SpO2 97%   BMI 42.83 kg/m   Physical Exam  Constitutional: He appears well-developed and well-nourished. No distress.  HENT:  Head: Normocephalic and atraumatic.  Mouth/Throat: Oropharynx is clear and moist. No oropharyngeal exudate.  Eyes: Pupils are equal, round, and reactive to light. Conjunctivae and EOM are normal. Right eye exhibits no discharge. Left eye exhibits no discharge. No scleral icterus.  Neck: Normal range of motion. Neck supple. No thyromegaly present.  Cardiovascular: Normal rate, regular rhythm, normal heart sounds and intact distal pulses. Exam reveals no gallop and no friction rub.  No murmur heard. Pulmonary/Chest: Effort normal and breath sounds normal. No stridor. No respiratory distress. He has no wheezes. He has no rales.  Abdominal: Soft. Bowel sounds are normal. He exhibits no distension. There is no tenderness. There is no rebound and no guarding.  Musculoskeletal: He exhibits no edema.  Midline cervical, thoracic, or lumbar tenderness, as well as right and left sided paraspinal tenderness Left wrist pain on palpation of the ulnar and lateral aspect as well as anatomical snuffbox tenderness, pain with extension, no hand tenderness, normal sensation, cap refill less than 2 seconds Mild edema to  bilateral knees, tenderness on palpation, no ecchymosis or notable edema  Lymphadenopathy:    He has no cervical adenopathy.  Neurological: He is alert. Coordination normal.  CN 3-12 intact; normal sensation throughout; 5/5 strength in all 4 extremities; equal bilateral grip strength  Skin: Skin is warm and dry. No rash noted. He is not diaphoretic. No pallor.  Psychiatric: He has a normal mood and affect.  Nursing note and vitals reviewed.    ED Treatments / Results  Labs (all labs ordered are listed, but only abnormal results are displayed) Labs Reviewed - No data to display  EKG None  Radiology Dg Thoracic Spine 2 View  Result Date: 01/30/2018 CLINICAL DATA:  47 year old male status post fall backwards on the floor while trying to sit down yesterday. Pain. EXAM: THORACIC SPINE 2 VIEWS COMPARISON:  Chest radiographs 11/03/2017 and earlier, chest CTA 03/22/2016. FINDINGS: Normal thoracic segmentation. Stable thoracic vertebral height and alignment. Cervicothoracic junction alignment is within normal limits. Preserved disc spaces. Mild chronic endplate spurring. The posterior ribs appear intact. Stable and negative visible thoracic visceral contours. IMPRESSION: No acute osseous abnormality identified in the thoracic spine. Electronically Signed   By: Odessa Fleming M.D.   On: 01/30/2018 16:57   Dg Lumbar Spine Complete  Result Date: 01/30/2018 CLINICAL DATA:  47 year old male status post fall backwards on the floor while trying to sit down yesterday. Pain. EXAM: LUMBAR SPINE - COMPLETE 4+ VIEW COMPARISON:  Thoracic spine radiographs today reported separately. CT Abdomen and Pelvis 11/13/2017. FINDINGS: Normal lumbar segmentation. Stable vertebral height and alignment. Mild retrolisthesis of L5 on S1 and mild straightening of lumbar lordosis. No pars fracture. Moderate to severe bilateral L3-L4 facet hypertrophy greater on the left. The sacral ala and SI joints appear intact. The visible pelvis  and lower thoracic levels appear intact. Stable cholecystectomy clips. IMPRESSION: 1.  No acute osseous abnormality identified in the lumbar spine. 2. Chronic moderate to severe L3-L4 facet arthropathy. Electronically Signed   By: Odessa Fleming M.D.   On: 01/30/2018 16:59   Dg Wrist Complete Left  Result Date: 01/30/2018 CLINICAL DATA:  47 year old male status post fall backwards on the floor while trying to sit down yesterday. Pain. EXAM: LEFT WRIST - COMPLETE 3+ VIEW COMPARISON:  None. FINDINGS: Moderate generalized osteophytosis about the left wrist. Possible chronic deformity of the ulnar and radial styloids. Severe joint space loss and subchondral sclerosis  at the [facility]st [facility] joint. Other joint spaces appear preserved. Carpal bone alignment preserved. The scaphoid appears intact. No acute osseous abnormality identified. IMPRESSION: Age advanced degenerative changes about the left wrist, perhaps in part posttraumatic. No acute osseous abnormality identified. Electronically Signed   By: Odessa Fleming M.D.   On: 01/30/2018 17:03   Dg Knee Complete 4 Views Left  Result Date: 01/30/2018 CLINICAL DATA:  47 year old male status post fall backwards on the floor while trying to sit down yesterday. Pain. EXAM: LEFT KNEE - COMPLETE 4+ VIEW COMPARISON:  Left knee series 10/16/2016. FINDINGS: Moderate medial compartment joint space loss and osteophytosis redemonstrated. Lateral and patellofemoral joint spaces appears stable and preserved. No joint effusion. Patella intact. No acute osseous abnormality identified. IMPRESSION: Stable, moderate for age medial compartment joint degeneration. Electronically Signed   By: Odessa Fleming M.D.   On: 01/30/2018 17:02   Dg Knee Complete 4 Views Right  Result Date: 01/30/2018 CLINICAL DATA:  47 year old male status post fall backwards on the floor while trying to sit down yesterday. Pain. EXAM: RIGHT KNEE - COMPLETE 4+ VIEW COMPARISON:  Right knee series 10/10/2017 and earlier. FINDINGS: Chronic  right total knee arthroplasty. Hardware appears stable and intact. Dystrophic calcifications and osteophytosis about the knee are stable. No acute osseous abnormality identified. IMPRESSION: Stable, prior right total knee arthroplasty. Electronically Signed   By: Odessa Fleming M.D.   On: 01/30/2018 17:01    Procedures Procedures (including critical care time)  Medications Ordered in ED Medications  HYDROcodone-acetaminophen (NORCO/VICODIN) 5-325 MG per tablet 2 tablet (has no administration in time range)     Initial Impression / Assessment and Plan / ED Course  I have reviewed the triage vital signs and the nursing notes.  Pertinent labs & imaging results that were available during my care of the patient were reviewed by me and considered in my medical decision making (see chart for details).     Patient with back pain, bilateral knee pain, left wrist pain after fall.  X-rays are negative for acute fracture.  Considering anatomical snuffbox tenderness will place thumb spica and have patient follow-up to hand for repeat x-ray.  Normal neuro exam without focal deficits.  CT head and C-spine are pending at shift change. At shift change, patient care transferred to Mathews Robinsons, PA-C for continued evaluation, follow up of CTs and determination of disposition. Anticipate discharge if CT is negative.  Plan to discharge home with Flexeril and Tylenol.  Follow-up to PCP and hand as discussed.   Final Clinical Impressions(s) / ED Diagnoses   Final diagnoses:  Fall, initial encounter  Left wrist pain  Acute midline back pain, unspecified back location  Acute pain of both knees    ED Discharge Orders        Ordered    cyclobenzaprine (FLEXERIL) 10 MG tablet  2 times daily PRN     01/30/18 1711    acetaminophen (TYLENOL) 500 MG tablet  Every 6 hours PRN     01/30/18 1711       Emi Holes, PA-C 01/30/18 1718    Azalia Bilis, MD 01/31/18 1724

## 2018-01-30 NOTE — ED Notes (Signed)
Patient transported to X-ray 

## 2018-02-11 ENCOUNTER — Other Ambulatory Visit: Payer: Self-pay | Admitting: Family Medicine

## 2018-02-11 DIAGNOSIS — R059 Cough, unspecified: Secondary | ICD-10-CM

## 2018-02-11 DIAGNOSIS — R05 Cough: Secondary | ICD-10-CM

## 2018-02-11 MED FILL — ?FAMOTIDINE 20 MG TABLET: 20 | 30 days supply | Qty: 30 | Fill #1

## 2018-02-11 MED FILL — LISINOPRIL 10 MG TABS: 10 | 30 days supply | Qty: 30 | Fill #2

## 2018-02-11 MED FILL — metFORMIN HCL 500 MG TABS: 500 | 30 days supply | Qty: 120 | Fill #2

## 2018-02-11 MED FILL — glipiZIDE 5 MG TABS: 5 | 30 days supply | Qty: 60 | Fill #2

## 2018-02-11 MED FILL — $Viagra 50mg tablet: 50 | 30 days supply | Qty: 10 | Fill #1

## 2018-02-11 MED FILL — ?CETIRIZINE HCL 10 MG TABLE: 10 | 30 days supply | Qty: 30 | Fill #0

## 2018-02-11 MED FILL — GABAPENTIN 300 MG CAPSULE: 300 | 30 days supply | Qty: 90 | Fill #1

## 2018-02-11 MED FILL — XARELTO 20 MG TABLET: 20 | 30 days supply | Qty: 30 | Fill #2

## 2018-02-11 MED FILL — HYDROCHLOROTHIAZIDE 25 MG T: 25 | 30 days supply | Qty: 30 | Fill #2

## 2018-03-06 ENCOUNTER — Ambulatory Visit: Payer: Self-pay | Admitting: Family Medicine

## 2018-04-10 ENCOUNTER — Ambulatory Visit (INDEPENDENT_AMBULATORY_CARE_PROVIDER_SITE_OTHER): Payer: Self-pay | Admitting: Orthopaedic Surgery

## 2018-04-10 ENCOUNTER — Ambulatory Visit (INDEPENDENT_AMBULATORY_CARE_PROVIDER_SITE_OTHER): Payer: Self-pay

## 2018-04-10 ENCOUNTER — Encounter (INDEPENDENT_AMBULATORY_CARE_PROVIDER_SITE_OTHER): Payer: Self-pay | Admitting: Orthopaedic Surgery

## 2018-04-10 DIAGNOSIS — M25562 Pain in left knee: Secondary | ICD-10-CM

## 2018-04-10 DIAGNOSIS — M1712 Unilateral primary osteoarthritis, left knee: Secondary | ICD-10-CM

## 2018-04-10 DIAGNOSIS — M25561 Pain in right knee: Secondary | ICD-10-CM

## 2018-04-10 DIAGNOSIS — G8929 Other chronic pain: Secondary | ICD-10-CM

## 2018-04-10 DIAGNOSIS — M1711 Unilateral primary osteoarthritis, right knee: Secondary | ICD-10-CM

## 2018-04-10 MED ORDER — TRAMADOL HCL 50 MG PO TABS: ORAL_TABLET | ORAL | 0 refills | Status: DC

## 2018-04-10 NOTE — Progress Notes (Signed)
Office Visit Note   Patient: David Dunlap           Date of Birth: December 28, 1970           MRN: [id number] Visit Date: 04/10/2018              Requested by: Charlott Rakes, MD [place],  [id number] PCP: Marliss Coots, NP   Assessment & Plan: Visit Diagnoses:  1. Unilateral primary osteoarthritis, left knee   2. Chronic pain of both knees   3. Unilateral primary osteoarthritis, right knee     Plan: Impression is 1 year status post right total knee replacement and advanced degenerative joint disease left knee.  In terms of the right knee x-rays are reassuring for no complications.  I think he is probably overcompensating due to his left knee and the fact that he has quadriceps weakness and he is overweight.  With the left knee we discussed conservative versus surgical treatment and he elects to proceed with a left total knee replacement.  He understands risks and benefits and postoperative rehab and recovery.  We did discuss the fact that his BMI is 42 and weight loss was discussed.  Patient is willing to accept the risks and proceed with scheduling for total knee replacement.  Tramadol was refilled.  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  He will have to stop this 3 days in advance and then we will resume it postoperatively.  He did not have any complications with the prior surgery. Total face to face encounter time was greater than 25 minutes and over half of this time was spent in counseling and/or coordination of care. The patient meets the AMA guidelines for Morbid (severe) obesity with a BMI > 40.0 and I have recommended weight loss.   Follow-Up Instructions: Return if symptoms worsen or fail to improve.   Orders:  Orders Placed This Encounter  Procedures  . XR KNEE 3 VIEW RIGHT  . XR KNEE 3 VIEW LEFT   Meds ordered this encounter  Medications  . traMADol (ULTRAM) 50 MG tablet    Sig: TAKE 1-2 TABS PO Q6-8 HOURS PRN PAIN   Dispense:  30 tablet    Refill:  0      Procedures: No procedures performed   Clinical Data: No additional findings.   Subjective: Chief Complaint  Patient presents with  . Right Knee - Pain  . Left Knee - Pain    Kit is a very pleasant 47 year old gentleman who comes in for follow-up of bilateral knee pain.  He is 1 year status post uncemented right total knee replacement.  He still has some pain around the patellar region.  He says it is worse with using stairs.  He endorses swelling.  His left knee is causing him chronic debilitating pain with normal ambulation and ADLs.   Review of Systems  Constitutional: Negative.   All other systems reviewed and are negative.    Objective: Vital Signs: There were no vitals taken for this visit.  Physical Exam  Constitutional: He is oriented to person, place, and time. He appears well-developed and well-nourished.  Pulmonary/Chest: Effort normal.  Abdominal: Soft.  Neurological: He is alert and oriented to person, place, and time.  Skin: Skin is warm.  Psychiatric: He has a normal mood and affect. His behavior is normal. Judgment and thought content normal.  Nursing note and vitals reviewed.   Ortho Exam Right knee  exam shows a fully healed surgical scar.  He has normal range of motion.  Patella tracking is normal.  Collaterals and cruciates are stable.  Left knee exam shows no joint effusion.  Positive patellofemoral crepitus.  Collaterals and cruciates are stable. Specialty Comments:  No specialty comments available.  Imaging: Xr Knee 3 View Left  Result Date: 04/10/2018 Advanced degenerative joint disease with significant joint space narrowing.  Xr Knee 3 View Right  Result Date: 04/10/2018 Stable right total knee replacement without any evidence of complication    PMFS History: Patient Active Problem List   Diagnosis Date Noted  . Unilateral primary osteoarthritis, left knee 04/10/2018  . Obstructive sleep  apnea 09/05/2017  . S/P TKR (total knee replacement), right 04/17/2017  . GERD (gastroesophageal reflux disease) 11/27/2016  . Unilateral primary osteoarthritis, right knee 10/18/2016  . Neuropathy 07/25/2016  . Acute cholecystitis 06/05/2016  . Diabetes mellitus without complication ([place]) 91/91/6606  . Cholelithiasis 04/09/2016  . Major depressive disorder, single episode 04/09/2016  . Leg DVT (deep venous thromboembolism), chronic ([place]) 12/08/2014  . Family history of diabetes mellitus (DM) 12/08/2014  . Moderate major depression, single episode ([place]) 08/22/2011  . Constipation 11/22/2010  . ANKLE EDEMA 10/19/2010  . Essential hypertension 10/12/2010   Past Medical History:  Diagnosis Date  . Arthritis   . Depression   . Diabetes mellitus without complication ([place]) 00/4599   NEW ONSET    WITH FAMILY HISTORY  . DVT (deep venous thrombosis) ([place])   . Essential hypertension   . Gallstones 05/2016  . GERD (gastroesophageal reflux disease)   . Sickle cell anemia (HCC)     Family History  Problem Relation Age of Onset  . Diabetes Mother   . Diabetes Father   . Hypertension Sister     Past Surgical History:  Procedure Laterality Date  . CHOLECYSTECTOMY N/A 06/07/2016   Procedure: LAPAROSCOPIC CHOLECYSTECTOMY WITH ATTEMPTED INTRAOPERATIVE CHOLANGIOGRAM;  Surgeon: Georganna Skeans, MD;  Location: [facility];  Service: General;  Laterality: N/A;  . LIGAMENT REPAIR     R forearm  . TOTAL KNEE ARTHROPLASTY Right 04/17/2017   Procedure: RIGHT TOTAL KNEE ARTHROPLASTY;  Surgeon: Leandrew Koyanagi, MD;  Location: [facility];  Service: Orthopedics;  Laterality: Right;   Social History   Occupational History  . Not on file  Tobacco Use  . Smoking status: Never Smoker  . Smokeless tobacco: Never Used  Substance and Sexual Activity  . Alcohol use: Yes    Alcohol/week: 0.6 - 1.2 oz    Types: 1 - 2 Cans of beer per week    Comment: doesn't drink all the drink  . Drug use: No    Types: Marijuana     Comment: last time 3 months ago ( back in 1989)  . Sexual activity: Not on file

## 2018-04-16 MED FILL — ?METFORMIN HCL 500MG TABS: 500 | 30 days supply | Qty: 120 | Fill #3

## 2018-04-16 MED FILL — XARELTO 20 MG TABLET: 20 | 30 days supply | Qty: 30 | Fill #3

## 2018-04-16 MED FILL — ?CETIRIZINE HCL 10 MG TABLE: 10 | 30 days supply | Qty: 30 | Fill #1

## 2018-04-16 MED FILL — $Viagra 50mg tablet: 50 | 30 days supply | Qty: 10 | Fill #2

## 2018-04-16 MED FILL — LISINOPRIL 10 MG TABS: 10 | 30 days supply | Qty: 30 | Fill #3

## 2018-04-16 MED FILL — ?ATORVASTATIN 20 MG TABLET: 20 | 30 days supply | Qty: 30 | Fill #1

## 2018-04-16 MED FILL — ?GLIPIZIDE 5MG TABLET: 5 | 30 days supply | Qty: 60 | Fill #3

## 2018-04-16 MED FILL — GABAPENTIN 300 MG CAPSULE: 300 | 30 days supply | Qty: 90 | Fill #2

## 2018-04-17 NOTE — Pre-Procedure Instructions (Addendum)
David Dunlap  04/17/2018      [facility] [place], [place] [address] [place] [address] Phone: [phone] Fax: [phone]    Your procedure is scheduled on  Monday  04/28/18  Report to [facility] Admitting at 1050 A.M.  Call this number if you have problems the morning of surgery:  [phone]   Remember:  Do not eat or drink after midnight.     Take these medicines the morning of surgery with A SIP OF WATER- FLUOXETINE (PROZAC), GABAPENTIN, TRAMADOL   7 days prior to surgery STOP taking any Aspirin(unless otherwise instructed by your surgeon), Aleve, Naproxen, Ibuprofen, Motrin, Advil, Goody's, BC's, all herbal medications, fish oil, and all vitamins    How to Manage Your Diabetes Before and After Surgery  Why is it important to control my blood sugar before and after surgery? . Improving blood sugar levels before and after surgery helps healing and can limit problems. . A way of improving blood sugar control is eating a healthy diet by: o  Eating less sugar and carbohydrates o  Increasing activity/exercise o  Talking with your doctor about reaching your blood sugar goals . High blood sugars (greater than 180 mg/dL) can raise your risk of infections and slow your recovery, so you will need to focus on controlling your diabetes during the weeks before surgery. . Make sure that the doctor who takes care of your diabetes knows about your planned surgery including the date and location.  How do I manage my blood sugar before surgery? . Check your blood sugar at least 4 times a day, starting 2 days before surgery, to make sure that the level is not too high or low. o Check your blood sugar the morning of your surgery when you wake up and every 2 hours until you get to the Short Stay unit. . If your blood sugar is less than 70 mg/dL, you will need to treat for low blood sugar: o Do not take insulin. o Treat  a low blood sugar (less than 70 mg/dL) with  cup of clear juice (cranberry or apple), 4 glucose tablets, OR glucose gel. Recheck blood sugar in 15 minutes after treatment (to make sure it is greater than 70 mg/dL). If your blood sugar is not greater than 70 mg/dL on recheck, call [phone] o  for further instructions. . Report your blood sugar to the short stay nurse when you get to Short Stay.  . If you are admitted to the hospital after surgery: o Your blood sugar will be checked by the staff and you will probably be given insulin after surgery (instead of oral diabetes medicines) to make sure you have good blood sugar levels. o The goal for blood sugar control after surgery is 80-180 mg/dL.         WHAT DO I DO ABOUT MY DIABETES MEDICATION?   Marland Kitchen Do not take oral diabetes medicines (pills) the morning of surgery.        Do not wear jewelry, make-up or nail polish.  Do not wear lotions, powders, or perfumes, or deodorant.  Do not shave 48 hours prior to surgery.  Men may shave face and neck.  Do not bring valuables to the hospital.  [facility] is not responsible for any belongings or valuables.  Contacts, dentures or bridgework may not be worn into surgery.  Leave your suitcase in the car.  After surgery  it may be brought to your room.  For patients admitted to the hospital, discharge time will be determined by your treatment team.  Patients discharged the day of surgery will not be allowed to drive home.   Name and phone number of your driver:    Special instructions:  David Dunlap - Preparing for Surgery  Before surgery, you can play an important role.  Because skin is not sterile, your skin needs to be as free of germs as possible.  You can reduce the number of germs on you skin by washing with CHG (chlorahexidine gluconate) soap before surgery.  CHG is an antiseptic cleaner which kills germs and bonds with the skin to continue killing germs even after washing.  Oral  Hygiene is also important in reducing the risk of infection.  Remember to brush your teeth with your regular toothpaste the morning of surgery.  Please DO NOT use if you have an allergy to CHG or antibacterial soaps.  If your skin becomes reddened/irritated stop using the CHG and inform your nurse when you arrive at Short Stay.  Do not shave (including legs and underarms) for at least 48 hours prior to the first CHG shower.  You may shave your face.  Please follow these instructions carefully:   1.  Shower with CHG Soap the night before surgery and the morning of Surgery.  2.  If you choose to wash your hair, wash your hair first as usual with your normal shampoo.  3.  After you shampoo, rinse your hair and body thoroughly to remove the shampoo. 4.  Use CHG as you would any other liquid soap.  You can apply chg directly to the skin and wash gently with a      scrungie or washcloth.           5.  Apply the CHG Soap to your body ONLY FROM THE NECK DOWN.   Do not use on open wounds or open sores. Avoid contact with your eyes, ears, mouth and genitals (private parts).  Wash genitals (private parts) with your normal soap.  6.  Wash thoroughly, paying special attention to the area where your surgery will be performed.  7.  Thoroughly rinse your body with warm water from the neck down.  8.  DO NOT shower/wash with your normal soap after using and rinsing off the CHG Soap.  9.  Pat yourself dry with a clean towel.            10.  Wear clean pajamas.            11.  Place clean sheets on your bed the night of your first shower and do not sleep with pets.  Day of Surgery  Do not apply any lotions/deoderants the morning of surgery.   Please wear clean clothes to the hospital/surgery center. Remember to brush your teeth with toothpaste.     Please read over the following fact sheets that you were given. Pain Booklet, MRSA Information and Surgical Site Infection Prevention

## 2018-04-18 ENCOUNTER — Encounter (HOSPITAL_COMMUNITY): Payer: Self-pay

## 2018-04-18 ENCOUNTER — Other Ambulatory Visit: Payer: Self-pay

## 2018-04-18 ENCOUNTER — Encounter (HOSPITAL_COMMUNITY)
Admission: RE | Admit: 2018-04-18 | Discharge: 2018-04-18 | Disposition: A | Discharge status: Home / Self Care | Payer: Self-pay | Source: Ambulatory Visit | Attending: Orthopaedic Surgery | Admitting: Orthopaedic Surgery

## 2018-04-18 DIAGNOSIS — E119 Type 2 diabetes mellitus without complications: Secondary | ICD-10-CM | POA: Insufficient documentation

## 2018-04-18 DIAGNOSIS — Z79899 Other long term (current) drug therapy: Secondary | ICD-10-CM | POA: Insufficient documentation

## 2018-04-18 DIAGNOSIS — Z01812 Encounter for preprocedural laboratory examination: Secondary | ICD-10-CM | POA: Insufficient documentation

## 2018-04-18 DIAGNOSIS — K219 Gastro-esophageal reflux disease without esophagitis: Secondary | ICD-10-CM | POA: Insufficient documentation

## 2018-04-18 DIAGNOSIS — F329 Major depressive disorder, single episode, unspecified: Secondary | ICD-10-CM | POA: Insufficient documentation

## 2018-04-18 DIAGNOSIS — D571 Sickle-cell disease without crisis: Secondary | ICD-10-CM | POA: Insufficient documentation

## 2018-04-18 DIAGNOSIS — M1712 Unilateral primary osteoarthritis, left knee: Secondary | ICD-10-CM | POA: Insufficient documentation

## 2018-04-18 DIAGNOSIS — I10 Essential (primary) hypertension: Secondary | ICD-10-CM | POA: Insufficient documentation

## 2018-04-18 DIAGNOSIS — Z7901 Long term (current) use of anticoagulants: Secondary | ICD-10-CM | POA: Insufficient documentation

## 2018-04-18 DIAGNOSIS — I82502 Chronic embolism and thrombosis of unspecified deep veins of left lower extremity: Secondary | ICD-10-CM | POA: Insufficient documentation

## 2018-04-18 DIAGNOSIS — G4730 Sleep apnea, unspecified: Secondary | ICD-10-CM | POA: Insufficient documentation

## 2018-04-18 DIAGNOSIS — Z7984 Long term (current) use of oral hypoglycemic drugs: Secondary | ICD-10-CM | POA: Insufficient documentation

## 2018-04-18 HISTORY — DX: Sleep apnea, unspecified: G47.30

## 2018-04-18 LAB — CBC WITH DIFFERENTIAL/PLATELET
ABS IMMATURE GRANULOCYTES: 0.1 10*3/uL (ref 0.0–0.1)
BASOS ABS: 0 10*3/uL (ref 0.0–0.1)
BASOS PCT: 0 %
Eosinophils Absolute: 0 10*3/uL (ref 0.0–0.7)
Eosinophils Relative: 0 %
HCT: 48.2 % (ref 39.0–52.0)
HEMOGLOBIN: 16.9 g/dL (ref 13.0–17.0)
Immature Granulocytes: 1 %
LYMPHS PCT: 20 %
Lymphs Abs: 2.4 10*3/uL (ref 0.7–4.0)
MCH: 28.7 pg (ref 26.0–34.0)
MCHC: 35.1 g/dL (ref 30.0–36.0)
MCV: 82 fL (ref 78.0–100.0)
MONO ABS: 1.1 10*3/uL — AB (ref 0.1–1.0)
Monocytes Relative: 9 %
NEUTROS ABS: 8.2 10*3/uL — AB (ref 1.7–7.7)
Neutrophils Relative %: 70 %
PLATELETS: 185 10*3/uL (ref 150–400)
RBC: 5.88 MIL/uL — AB (ref 4.22–5.81)
RDW: 12.9 % (ref 11.5–15.5)
WBC: 11.8 10*3/uL — AB (ref 4.0–10.5)

## 2018-04-18 LAB — HEMOGLOBIN A1C
HEMOGLOBIN A1C: 7.6 % — AB (ref 4.8–5.6)
MEAN PLASMA GLUCOSE: 171.42 mg/dL

## 2018-04-18 LAB — COMPREHENSIVE METABOLIC PANEL
ALT: 39 U/L (ref 0–44)
AST: 19 U/L (ref 15–41)
Albumin: 3.8 g/dL (ref 3.5–5.0)
Alkaline Phosphatase: 65 U/L (ref 38–126)
Anion gap: 9 (ref 5–15)
BUN: 10 mg/dL (ref 6–20)
CHLORIDE: 101 mmol/L (ref 98–111)
CO2: 25 mmol/L (ref 22–32)
CREATININE: 1.1 mg/dL (ref 0.61–1.24)
Calcium: 9.1 mg/dL (ref 8.9–10.3)
Glucose, Bld: 158 mg/dL — ABNORMAL HIGH (ref 70–99)
POTASSIUM: 4.8 mmol/L (ref 3.5–5.1)
Sodium: 135 mmol/L (ref 135–145)
Total Bilirubin: 1.5 mg/dL — ABNORMAL HIGH (ref 0.3–1.2)
Total Protein: 7.1 g/dL (ref 6.5–8.1)

## 2018-04-18 LAB — PROTIME-INR
INR: 1.09
PROTHROMBIN TIME: 14 s (ref 11.4–15.2)

## 2018-04-18 LAB — GLUCOSE, CAPILLARY: GLUCOSE-CAPILLARY: 157 mg/dL — AB (ref 70–99)

## 2018-04-18 LAB — TYPE AND SCREEN
ABO/RH(D): A POS
ANTIBODY SCREEN: NEGATIVE

## 2018-04-18 LAB — APTT: APTT: 29 s (ref 24–36)

## 2018-04-18 LAB — SURGICAL PCR SCREEN
MRSA, PCR: NEGATIVE
STAPHYLOCOCCUS AUREUS: NEGATIVE

## 2018-04-18 NOTE — Progress Notes (Signed)
PCP  Lavinia SharpsMary Ann Placey  NP  Pt. Has cardiac clearance note from 02-14-17 for Left knee replacement done 04-17-2017.   Epic Note 04-08-2017 by [facility]ison Zelenak  [facility]   ECHO  03-14-2017

## 2018-04-18 NOTE — Pre-Procedure Instructions (Signed)
David Dunlap  04/18/2018      [facility] [place] [place] [address] Phone: [phone] Fax: [phone]    Your procedure is scheduled on  Monday  04/28/18  [facility] A.M.  Call this number if you have problems the morning of surgery:  [phone]   Remember:  Do not eat or drink after midnight.     Take these medicines the morning of surgery with A SIP OF WATER- FLUOXETINE (PROZAC), GABAPENTIN, TRAMADOL   STOP XARELTO 3 DAYS PRIOR TO SURGERY. Last dose on 04-25-2018   7 days prior to surgery STOP taking any Aspirin(unless otherwise instructed by your surgeon), Aleve, Naproxen, Ibuprofen, Motrin, Advil, Goody's, BC's, all herbal medications, fish oil, and all vitamins    How to Manage Your Diabetes Before and After Surgery  Why is it important to control my blood sugar before and after surgery? . Improving blood sugar levels before and after surgery helps healing and can limit problems. . A way of improving blood sugar control is eating a healthy diet by: o  Eating less sugar and carbohydrates o  Increasing activity/exercise o  Talking with your doctor about reaching your blood sugar goals . High blood sugars (greater than 180 mg/dL) can raise your risk of infections and slow your recovery, so you will need to focus on controlling your diabetes during the weeks before surgery. . Make sure that the doctor who takes care of your diabetes knows about your planned surgery including the date and location.  How do I manage my blood sugar before surgery? . Check your blood sugar at least 4 times a day, starting 2 days before surgery, to make sure that the level is not too high or low. o Check your blood sugar the morning of your surgery when you wake up and every 2 hours until you get to the Short Stay unit. . If your blood sugar is less than 70 mg/dL, you will  need to treat for low blood sugar: o Do not take insulin. o Treat a low blood sugar (less than 70 mg/dL) with  cup of clear juice (cranberry or apple), 4 glucose tablets, OR glucose gel. Recheck blood sugar in 15 minutes after treatment (to make sure it is greater than 70 mg/dL). If your blood sugar is not greater than 70 mg/dL on recheck, call [phone] o  for further instructions. . Report your blood sugar to the short stay nurse when you get to Short Stay.  . If you are admitted to the hospital after surgery: o Your blood sugar will be checked by the staff and you will probably be given insulin after surgery (instead of oral diabetes medicines) to make sure you have good blood sugar levels. o The goal for blood sugar control after surgery is 80-180 mg/dL.         WHAT DO I DO ABOUT MY DIABETES MEDICATION?   Marland Kitchen. Do not take oral diabetes medicines (pills) the morning of surgery.        Do not wear jewelry, make-up or nail polish.  Do not wear lotions, powders, or perfumes, or deodorant.  Do not shave 48 hours prior to surgery.  Men may shave face and neck.  Do not bring valuables to the hospital.  [facility] any belongings or valuables.  Contacts, dentures or bridgework may not be  worn into surgery.  Leave your suitcase in the car.  After surgery it may be brought to your room.  For patients admitted to the hospital, discharge time will be determined by your treatment team.  Patients discharged the day of surgery will not be allowed to drive home.   Name and phone number of your driver:    Special instructions:  [place] - Preparing for Surgery  Before surgery, you can play an important role.  Because skin is not sterile, your skin needs to be as free of germs as possible.  You can reduce the number of germs on you skin by washing with CHG (chlorahexidine gluconate) soap before surgery.  CHG is an antiseptic cleaner which kills germs and bonds  with the skin to continue killing germs even after washing.  Oral Hygiene is also important in reducing the risk of infection.  Remember to brush your teeth with your regular toothpaste the morning of surgery.  Please DO NOT use if you have an allergy to CHG or antibacterial soaps.  If your skin becomes reddened/irritated stop using the CHG and inform your nurse when you arrive at Short Stay.  Do not shave (including legs and underarms) for at least 48 hours prior to the first CHG shower.  You may shave your face.  Please follow these instructions carefully:   1.  Shower with CHG Soap the night before surgery and the morning of Surgery.  2.  If you choose to wash your hair, wash your hair first as usual with your normal shampoo.  3.  After you shampoo, rinse your hair and body thoroughly to remove the shampoo. 4.  Use CHG as you would any other liquid soap.  You can apply chg directly to the skin and wash gently with a      scrungie or washcloth.           5.  Apply the CHG Soap to your body ONLY FROM THE NECK DOWN.   Do not use on open wounds or open sores. Avoid contact with your eyes, ears, mouth and genitals (private parts).  Wash genitals (private parts) with your normal soap.  6.  Wash thoroughly, paying special attention to the area where your surgery will be performed.  7.  Thoroughly rinse your body with warm water from the neck down.  8.  DO NOT shower/wash with your normal soap after using and rinsing off the CHG Soap.  9.  Pat yourself dry with a clean towel.            10.  Wear clean pajamas.            11.  Place clean sheets on your bed the night of your first shower and do not sleep with pets.  Day of Surgery  Do not apply any lotions/deoderants the morning of surgery.   Please wear clean clothes to the hospital/surgery center. Remember to brush your teeth with toothpaste.     Please read over the following fact sheets that you were given. Pain Booklet, MRSA  Information and Surgical Site Infection Prevention

## 2018-04-21 NOTE — Progress Notes (Signed)
Anesthesia Chart Review:  Case:  [id number] Date/Time:  04/28/18 1236   Procedure:  LEFT TOTAL KNEE ARTHROPLASTY (Left Knee)   Anesthesia type:  Spinal   Pre-op diagnosis:  left knee degenerative joint disease   [facility] ROOM 04 / [facility]   Surgeon:  Tarry KosXu, Naiping M, MD      DISCUSSION: 47 yo male never smoker for above procedure. Pertinent hx includes HTN, DM2, RLE DVT 03/18/14 (chronic right PT vein DVT 11/17/14 Duplex), LLE DVT 11/17/14 (chronic left peroneal vein thrombosis with resolution of left PT vein DVT 10/28/15 Duplex), GERD, arthritis, depression, cholecystectomy 06/07/16, Right TKA 04/17/2017. BMI is consistent with morbid obesity. He is on Xarelto for chronic DVT.  Pt saw cardiology Manson PasseyBhavinkumar Bhagat, PA on 02/14/17 solely for cardiac clearance for Right TKA on 04/17/2017. He underwent Echo 03/14/17 and based on results Bhagat stated "Echocardiogram showed normal pumping function of the heart with mild stiffness and hypertrophy of left ventricle. No valvular abnormality. He is clear for surgery with low to moderate risk given history of DVT and diabetes per Nedra HaiLee criteria." At that time he was not scheduled for followup.  Per Dr. Roda ShuttersXu will stop xarelto 3d preop.  It does not appear the pt has had any significant change to his health since previously cleared by cardiology for right TKA on 04/17/2017 which was uncomplicated. Anticipate he can proceed with surgery as planned barring acute status change.  VS: BP (!) 131/92   Pulse 78   Temp 36.6 C   Resp 20   Ht 5\' 9"  (1.753 m)   Wt 283 lb 1.6 oz (128.4 kg)   SpO2 98%   BMI 41.81 kg/m   PROVIDERS: Hoy Registerewlin, [place] is PCP last seen 12/04/2017   LABS: Labs reviewed: Acceptable for surgery. (all labs ordered are listed, but only abnormal results are displayed)  Labs Reviewed  GLUCOSE, CAPILLARY - Abnormal; Notable for the following components:      Result Value   Glucose-Capillary 157 (*)    All other components within normal limits   CBC WITH DIFFERENTIAL/PLATELET - Abnormal; Notable for the following components:   WBC 11.8 (*)    RBC 5.88 (*)    Neutro Abs 8.2 (*)    Monocytes Absolute 1.1 (*)    All other components within normal limits  COMPREHENSIVE METABOLIC PANEL - Abnormal; Notable for the following components:   Glucose, Bld 158 (*)    Total Bilirubin 1.5 (*)    All other components within normal limits  HEMOGLOBIN A1C - Abnormal; Notable for the following components:   Hgb A1c MFr Bld 7.6 (*)    All other components within normal limits  SURGICAL PCR SCREEN  APTT  PROTIME-INR  TYPE AND SCREEN     IMAGES: CHEST  2 VIEW 11/03/2017  COMPARISON:  Chest x-ray dated November 28, 2016.  FINDINGS: The heart size and mediastinal contours are within normal limits. Both lungs are clear. The visualized skeletal structures are unremarkable.  IMPRESSION: No active cardiopulmonary disease.    EKG: 11/04/2017: NSR. Minimal voltage criteria for LVH  CV: Echo 03/14/17:  Study Conclusions - Left ventricle: The cavity size was normal. There was mild concentric hypertrophy. Systolic function was normal. Wall motion was normal; there were no regional wall motion abnormalities. Doppler parameters are consistent with abnormal left ventricular relaxation (grade 1 diastolic dysfunction). There was no evidence of elevated ventricular filling pressure by Doppler parameters. - Aortic valve: There was no regurgitation. - Aortic root: The  aortic root was normal in size. - Left atrium: The atrium was at the upper limits of normal in size. - Right ventricle: Systolic function was normal. - Tricuspid valve: There was trivial regurgitation. - Pulmonary arteries: Systolic pressure was within the normal range. - Inferior vena cava: The vessel was normal in size. - Pericardium, extracardiac: There was no pericardial effusion.   Past Medical History:  Diagnosis Date  . Arthritis   . Depression   .  Diabetes mellitus without complication (HCC) 05/2016   NEW ONSET    WITH FAMILY HISTORY  . DVT (deep venous thrombosis) ([facility])   . Essential hypertension   . Gallstones 05/2016  . GERD (gastroesophageal reflux disease)   . Sickle cell anemia (HCC)    Sickle trait  . Sleep apnea    Cpap    Past Surgical History:  Procedure Laterality Date  . CHOLECYSTECTOMY N/A 06/07/2016   Procedure: LAPAROSCOPIC CHOLECYSTECTOMY WITH ATTEMPTED INTRAOPERATIVE CHOLANGIOGRAM;  Surgeon: Violeta Gelinas, MD;  [facility];  Service: General;  Laterality: N/A;  . LIGAMENT REPAIR     R forearm  . TOTAL KNEE ARTHROPLASTY Right 04/17/2017   Procedure: RIGHT TOTAL KNEE ARTHROPLASTY;  Surgeon: Tarry Kos, MD;  [facility];  Service: Orthopedics;  Laterality: Right;    MEDICATIONS: . acetaminophen (TYLENOL) 500 MG tablet  . atorvastatin (LIPITOR) 20 MG tablet  . Blood Glucose Monitoring Suppl (TRUE METRIX METER) DEVI  . cetirizine (ZYRTEC) 10 MG tablet  . cyclobenzaprine (FLEXERIL) 10 MG tablet  . dicyclomine (BENTYL) 20 MG tablet  . diphenoxylate-atropine (LOMOTIL) 2.5-0.025 MG tablet  . FLUoxetine (PROZAC) 20 MG tablet  . gabapentin (NEURONTIN) 300 MG capsule  . glipiZIDE (GLUCOTROL) 5 MG tablet  . glucose blood (TRUE METRIX BLOOD GLUCOSE TEST) test strip  . hydrochlorothiazide (HYDRODIURIL) 25 MG tablet  . lisinopril (PRINIVIL,ZESTRIL) 10 MG tablet  . metFORMIN (GLUCOPHAGE) 500 MG tablet  . ondansetron (ZOFRAN) 4 MG tablet  . rivaroxaban (XARELTO) 20 MG TABS tablet  . sildenafil (VIAGRA) 50 MG tablet  . traMADol (ULTRAM) 50 MG tablet  . TRUEPLUS LANCETS 28G MISC   No current facility-administered medications for this encounter.     Zannie Cove [facility] Short Stay Center/Anesthesiology Phone [phone] 04/21/2018 11:54 AM

## 2018-04-25 MED ORDER — CEFAZOLIN SODIUM 10 G IJ SOLR
3.0000 g | INTRAMUSCULAR | Status: AC
  Administered 2018-04-28: 3 g via INTRAVENOUS
  Filled 2018-04-25: qty 3

## 2018-04-25 MED ORDER — TRANEXAMIC ACID 1000 MG/10ML IV SOLN
2000.0000 mg | INTRAVENOUS | Status: AC
  Administered 2018-04-28: 2000 mg via TOPICAL
  Filled 2018-04-25: qty 20

## 2018-04-25 MED ORDER — TRANEXAMIC ACID 1000 MG/10ML IV SOLN
1000.0000 mg | INTRAVENOUS | Status: AC
  Administered 2018-04-28: 1000 mg via INTRAVENOUS
  Filled 2018-04-25: qty 1100

## 2018-04-28 ENCOUNTER — Encounter (HOSPITAL_COMMUNITY): Payer: Self-pay | Admitting: *Deleted

## 2018-04-28 ENCOUNTER — Encounter (HOSPITAL_COMMUNITY)
Admission: RE | Disposition: A | Discharge status: Skilled Nursing Facility | Payer: Self-pay | Source: Ambulatory Visit | Attending: Orthopaedic Surgery

## 2018-04-28 ENCOUNTER — Inpatient Hospital Stay (HOSPITAL_COMMUNITY): Payer: Self-pay | Admitting: Physician Assistant

## 2018-04-28 ENCOUNTER — Inpatient Hospital Stay (HOSPITAL_COMMUNITY): Payer: Self-pay | Admitting: Anesthesiology

## 2018-04-28 ENCOUNTER — Inpatient Hospital Stay (HOSPITAL_COMMUNITY): Payer: Self-pay

## 2018-04-28 ENCOUNTER — Inpatient Hospital Stay (HOSPITAL_COMMUNITY)
Admission: RE | Admit: 2018-04-28 | Discharge: 2018-05-02 | DRG: 470 | Disposition: A | Discharge status: Skilled Nursing Facility | Payer: Self-pay | Source: Ambulatory Visit | Attending: Orthopaedic Surgery | Admitting: Orthopaedic Surgery

## 2018-04-28 ENCOUNTER — Other Ambulatory Visit: Payer: Self-pay

## 2018-04-28 DIAGNOSIS — Z9089 Acquired absence of other organs: Secondary | ICD-10-CM

## 2018-04-28 DIAGNOSIS — I10 Essential (primary) hypertension: Secondary | ICD-10-CM | POA: Diagnosis present

## 2018-04-28 DIAGNOSIS — Z6841 Body Mass Index (BMI) 40.0 and over, adult: Secondary | ICD-10-CM

## 2018-04-28 DIAGNOSIS — Z7901 Long term (current) use of anticoagulants: Secondary | ICD-10-CM

## 2018-04-28 DIAGNOSIS — E119 Type 2 diabetes mellitus without complications: Secondary | ICD-10-CM | POA: Diagnosis present

## 2018-04-28 DIAGNOSIS — F329 Major depressive disorder, single episode, unspecified: Secondary | ICD-10-CM | POA: Diagnosis present

## 2018-04-28 DIAGNOSIS — Z833 Family history of diabetes mellitus: Secondary | ICD-10-CM

## 2018-04-28 DIAGNOSIS — Z96659 Presence of unspecified artificial knee joint: Secondary | ICD-10-CM

## 2018-04-28 DIAGNOSIS — Z96652 Presence of left artificial knee joint: Secondary | ICD-10-CM

## 2018-04-28 DIAGNOSIS — M1712 Unilateral primary osteoarthritis, left knee: Principal | ICD-10-CM

## 2018-04-28 DIAGNOSIS — K219 Gastro-esophageal reflux disease without esophagitis: Secondary | ICD-10-CM | POA: Diagnosis present

## 2018-04-28 DIAGNOSIS — G4730 Sleep apnea, unspecified: Secondary | ICD-10-CM | POA: Diagnosis present

## 2018-04-28 DIAGNOSIS — Z8249 Family history of ischemic heart disease and other diseases of the circulatory system: Secondary | ICD-10-CM

## 2018-04-28 DIAGNOSIS — D571 Sickle-cell disease without crisis: Secondary | ICD-10-CM | POA: Diagnosis present

## 2018-04-28 DIAGNOSIS — Z86718 Personal history of other venous thrombosis and embolism: Secondary | ICD-10-CM

## 2018-04-28 HISTORY — PX: TOTAL KNEE ARTHROPLASTY: SHX125

## 2018-04-28 LAB — GLUCOSE, CAPILLARY
GLUCOSE-CAPILLARY: 193 mg/dL — AB (ref 70–99)
Glucose-Capillary: 194 mg/dL — ABNORMAL HIGH (ref 70–99)
Glucose-Capillary: 181 mg/dL — ABNORMAL HIGH (ref 70–99)
Glucose-Capillary: 306 mg/dL — ABNORMAL HIGH (ref 70–99)

## 2018-04-28 SURGERY — ARTHROPLASTY, KNEE, TOTAL
Anesthesia: Regional | Site: Knee | Laterality: Left

## 2018-04-28 MED ORDER — SODIUM CHLORIDE 0.9 % IR SOLN
Status: DC | PRN
  Administered 2018-04-28: 3000 mL

## 2018-04-28 MED ORDER — ONDANSETRON HCL 4 MG PO TABS
4.0000 mg | ORAL_TABLET | Freq: Four times a day (QID) | ORAL | Status: DC | PRN
Start: 2018-04-28 — End: 2018-05-03

## 2018-04-28 MED ORDER — RIVAROXABAN 20 MG PO TABS
20.0000 mg | ORAL_TABLET | Freq: Every day | ORAL | Status: DC
  Administered 2018-04-28 – 2018-05-02 (×5): 20 mg via ORAL
  Filled 2018-04-28 (×5): qty 1

## 2018-04-28 MED ORDER — ONDANSETRON HCL 4 MG/2ML IJ SOLN: 4.0000 mg | Freq: Four times a day (QID) | INTRAMUSCULAR | Status: DC | PRN

## 2018-04-28 MED ORDER — MIDAZOLAM HCL 2 MG/2ML IJ SOLN
INTRAMUSCULAR | Status: AC
  Administered 2018-04-28: 1 mg via INTRAVENOUS
  Filled 2018-04-28: qty 2

## 2018-04-28 MED ORDER — PHENYLEPHRINE HCL 10 MG/ML IJ SOLN
INTRAMUSCULAR | Status: DC | PRN
  Administered 2018-04-28 (×4): 80 ug via INTRAVENOUS

## 2018-04-28 MED ORDER — PHENYLEPHRINE 40 MCG/ML (10ML) SYRINGE FOR IV PUSH (FOR BLOOD PRESSURE SUPPORT)
PREFILLED_SYRINGE | INTRAVENOUS | Status: AC
  Filled 2018-04-28: qty 10

## 2018-04-28 MED ORDER — PHENOL 1.4 % MT LIQD: 1.0000 | OROMUCOSAL | Status: DC | PRN

## 2018-04-28 MED ORDER — ATORVASTATIN CALCIUM 20 MG PO TABS
20.0000 mg | ORAL_TABLET | Freq: Every day | ORAL | Status: DC
  Administered 2018-04-28 – 2018-05-02 (×5): 20 mg via ORAL
  Filled 2018-04-28 (×5): qty 1

## 2018-04-28 MED ORDER — LIDOCAINE 2% (20 MG/ML) 5 ML SYRINGE
INTRAMUSCULAR | Status: AC
  Filled 2018-04-28: qty 5

## 2018-04-28 MED ORDER — SULFAMETHOXAZOLE-TRIMETHOPRIM 800-160 MG PO TABS: 1.0000 | ORAL_TABLET | Freq: Two times a day (BID) | ORAL | 0 refills | Status: DC

## 2018-04-28 MED ORDER — SENNOSIDES-DOCUSATE SODIUM 8.6-50 MG PO TABS: 1.0000 | ORAL_TABLET | Freq: Every evening | ORAL | 1 refills | Status: DC | PRN

## 2018-04-28 MED ORDER — MIDAZOLAM HCL 5 MG/5ML IJ SOLN
INTRAMUSCULAR | Status: DC | PRN
  Administered 2018-04-28: 1 mg via INTRAVENOUS

## 2018-04-28 MED ORDER — ALUM & MAG HYDROXIDE-SIMETH 200-200-20 MG/5ML PO SUSP: 30.0000 mL | ORAL | Status: DC | PRN

## 2018-04-28 MED ORDER — LISINOPRIL 10 MG PO TABS
10.0000 mg | ORAL_TABLET | Freq: Every day | ORAL | Status: DC
  Administered 2018-04-29 – 2018-05-02 (×4): 10 mg via ORAL
  Filled 2018-04-28 (×4): qty 1

## 2018-04-28 MED ORDER — SODIUM CHLORIDE 0.9% FLUSH
INTRAVENOUS | Status: DC | PRN
  Administered 2018-04-28: 40 mL

## 2018-04-28 MED ORDER — ONDANSETRON HCL 4 MG/2ML IJ SOLN
INTRAMUSCULAR | Status: AC
  Filled 2018-04-28: qty 2

## 2018-04-28 MED ORDER — FENTANYL CITRATE (PF) 250 MCG/5ML IJ SOLN
INTRAMUSCULAR | Status: AC
  Filled 2018-04-28: qty 5

## 2018-04-28 MED ORDER — FLUOXETINE HCL 20 MG PO CAPS
40.0000 mg | ORAL_CAPSULE | Freq: Every day | ORAL | Status: DC
  Administered 2018-04-29 – 2018-05-02 (×4): 40 mg via ORAL
  Filled 2018-04-28 (×5): qty 2

## 2018-04-28 MED ORDER — OXYCODONE HCL 5 MG PO TABS: 5.0000 mg | ORAL_TABLET | ORAL | 0 refills | Status: DC | PRN

## 2018-04-28 MED ORDER — BUPIVACAINE IN DEXTROSE 0.75-8.25 % IT SOLN
INTRATHECAL | Status: DC | PRN
  Administered 2018-04-28: 2 mL via INTRATHECAL

## 2018-04-28 MED ORDER — DOCUSATE SODIUM 100 MG PO CAPS
100.0000 mg | ORAL_CAPSULE | Freq: Two times a day (BID) | ORAL | Status: DC
  Administered 2018-04-28 – 2018-05-02 (×5): 100 mg via ORAL
  Filled 2018-04-28 (×7): qty 1

## 2018-04-28 MED ORDER — ROPIVACAINE HCL 7.5 MG/ML IJ SOLN
INTRAMUSCULAR | Status: DC | PRN
  Administered 2018-04-28 (×2): 20 mL via PERINEURAL

## 2018-04-28 MED ORDER — CYCLOBENZAPRINE HCL 10 MG PO TABS: 10.0000 mg | ORAL_TABLET | Freq: Two times a day (BID) | ORAL | Status: DC | PRN

## 2018-04-28 MED ORDER — LORATADINE 10 MG PO TABS
10.0000 mg | ORAL_TABLET | Freq: Every day | ORAL | Status: DC
  Administered 2018-04-29 – 2018-05-02 (×4): 10 mg via ORAL
  Filled 2018-04-28 (×5): qty 1

## 2018-04-28 MED ORDER — SODIUM CHLORIDE 0.9 % IV SOLN
INTRAVENOUS | Status: DC
  Administered 2018-04-28 (×2): via INTRAVENOUS

## 2018-04-28 MED ORDER — CEFAZOLIN SODIUM-DEXTROSE 2-4 GM/100ML-% IV SOLN
2.0000 g | Freq: Four times a day (QID) | INTRAVENOUS | Status: AC
  Administered 2018-04-28 – 2018-04-29 (×3): 2 g via INTRAVENOUS
  Filled 2018-04-28 (×3): qty 100

## 2018-04-28 MED ORDER — CHLORHEXIDINE GLUCONATE 4 % EX LIQD: 60.0000 mL | Freq: Once | CUTANEOUS | Status: DC

## 2018-04-28 MED ORDER — SODIUM CHLORIDE 0.9 % IV SOLN
INTRAVENOUS | Status: DC | PRN
  Administered 2018-04-28: 40 ug/min via INTRAVENOUS

## 2018-04-28 MED ORDER — CELECOXIB 200 MG PO CAPS
200.0000 mg | ORAL_CAPSULE | Freq: Two times a day (BID) | ORAL | Status: DC
  Administered 2018-04-29 – 2018-05-02 (×7): 200 mg via ORAL
  Filled 2018-04-28 (×7): qty 1

## 2018-04-28 MED ORDER — DEXAMETHASONE SODIUM PHOSPHATE 10 MG/ML IJ SOLN
INTRAMUSCULAR | Status: AC
  Filled 2018-04-28: qty 1

## 2018-04-28 MED ORDER — OXYCODONE HCL ER 10 MG PO T12A
10.0000 mg | EXTENDED_RELEASE_TABLET | Freq: Two times a day (BID) | ORAL | Status: DC
  Administered 2018-04-28 – 2018-05-02 (×9): 10 mg via ORAL
  Filled 2018-04-28 (×9): qty 1

## 2018-04-28 MED ORDER — VANCOMYCIN HCL 1000 MG IV SOLR
INTRAVENOUS | Status: DC | PRN
  Administered 2018-04-28: 1000 mg via TOPICAL

## 2018-04-28 MED ORDER — 0.9 % SODIUM CHLORIDE (POUR BTL) OPTIME
TOPICAL | Status: DC | PRN
  Administered 2018-04-28: 1000 mL

## 2018-04-28 MED ORDER — PROPOFOL 10 MG/ML IV BOLUS
INTRAVENOUS | Status: AC
  Filled 2018-04-28: qty 20

## 2018-04-28 MED ORDER — MIDAZOLAM HCL 2 MG/2ML IJ SOLN
1.0000 mg | Freq: Once | INTRAMUSCULAR | Status: AC
  Administered 2018-04-28: 1 mg via INTRAVENOUS

## 2018-04-28 MED ORDER — POLYETHYLENE GLYCOL 3350 17 G PO PACK: 17.0000 g | PACK | Freq: Every day | ORAL | Status: DC | PRN

## 2018-04-28 MED ORDER — FENTANYL CITRATE (PF) 100 MCG/2ML IJ SOLN
INTRAMUSCULAR | Status: DC | PRN
  Administered 2018-04-28: 50 ug via INTRAVENOUS

## 2018-04-28 MED ORDER — KETOROLAC TROMETHAMINE 15 MG/ML IJ SOLN
30.0000 mg | Freq: Four times a day (QID) | INTRAMUSCULAR | Status: AC
  Administered 2018-04-28 – 2018-04-29 (×3): 30 mg via INTRAVENOUS
  Filled 2018-04-28 (×3): qty 2

## 2018-04-28 MED ORDER — ACETAMINOPHEN 325 MG PO TABS: 325.0000 mg | ORAL_TABLET | Freq: Four times a day (QID) | ORAL | Status: DC | PRN

## 2018-04-28 MED ORDER — METOCLOPRAMIDE HCL 5 MG/ML IJ SOLN: 5.0000 mg | Freq: Three times a day (TID) | INTRAMUSCULAR | Status: DC | PRN

## 2018-04-28 MED ORDER — MENTHOL 3 MG MT LOZG: 1.0000 | LOZENGE | OROMUCOSAL | Status: DC | PRN

## 2018-04-28 MED ORDER — HYDROMORPHONE HCL 1 MG/ML IJ SOLN
0.2500 mg | INTRAMUSCULAR | Status: DC | PRN
  Administered 2018-04-28 (×2): 0.5 mg via INTRAVENOUS

## 2018-04-28 MED ORDER — DIPHENOXYLATE-ATROPINE 2.5-0.025 MG PO TABS: 1.0000 | ORAL_TABLET | Freq: Four times a day (QID) | ORAL | Status: DC | PRN

## 2018-04-28 MED ORDER — GLIPIZIDE 5 MG PO TABS
5.0000 mg | ORAL_TABLET | Freq: Two times a day (BID) | ORAL | Status: DC
  Administered 2018-04-28 – 2018-05-02 (×9): 5 mg via ORAL
  Filled 2018-04-28 (×9): qty 1

## 2018-04-28 MED ORDER — METHOCARBAMOL 1000 MG/10ML IJ SOLN
500.0000 mg | Freq: Four times a day (QID) | INTRAVENOUS | Status: DC | PRN
  Filled 2018-04-28: qty 5

## 2018-04-28 MED ORDER — INSULIN ASPART 100 UNIT/ML ~~LOC~~ SOLN
0.0000 [IU] | Freq: Three times a day (TID) | SUBCUTANEOUS | Status: DC
  Administered 2018-04-28: 15 [IU] via SUBCUTANEOUS
  Administered 2018-04-29: 11 [IU] via SUBCUTANEOUS
  Administered 2018-04-29: 20 [IU] via SUBCUTANEOUS
  Administered 2018-04-29 – 2018-04-30 (×2): 4 [IU] via SUBCUTANEOUS
  Administered 2018-04-30: 11 [IU] via SUBCUTANEOUS
  Administered 2018-04-30: 7 [IU] via SUBCUTANEOUS
  Administered 2018-05-01: 4 [IU] via SUBCUTANEOUS
  Administered 2018-05-01: 7 [IU] via SUBCUTANEOUS
  Administered 2018-05-01 – 2018-05-02 (×2): 4 [IU] via SUBCUTANEOUS
  Administered 2018-05-02: 3 [IU] via SUBCUTANEOUS
  Administered 2018-05-02: 7 [IU] via SUBCUTANEOUS

## 2018-04-28 MED ORDER — CELECOXIB 200 MG PO CAPS: 200.0000 mg | ORAL_CAPSULE | Freq: Two times a day (BID) | ORAL | Status: DC

## 2018-04-28 MED ORDER — SORBITOL 70 % SOLN: 30.0000 mL | Freq: Every day | Status: DC | PRN

## 2018-04-28 MED ORDER — FENTANYL CITRATE (PF) 100 MCG/2ML IJ SOLN
INTRAMUSCULAR | Status: AC
  Administered 2018-04-28: 50 ug via INTRAVENOUS
  Filled 2018-04-28: qty 2

## 2018-04-28 MED ORDER — PROMETHAZINE HCL 25 MG PO TABS: 25.0000 mg | ORAL_TABLET | Freq: Four times a day (QID) | ORAL | 1 refills | Status: DC | PRN

## 2018-04-28 MED ORDER — INSULIN ASPART 100 UNIT/ML ~~LOC~~ SOLN
0.0000 [IU] | Freq: Every day | SUBCUTANEOUS | Status: DC
  Administered 2018-04-29 – 2018-05-01 (×3): 2 [IU] via SUBCUTANEOUS

## 2018-04-28 MED ORDER — TRANEXAMIC ACID 1000 MG/10ML IV SOLN
1000.0000 mg | Freq: Once | INTRAVENOUS | Status: AC
  Administered 2018-04-28: 1000 mg via INTRAVENOUS
  Filled 2018-04-28: qty 10

## 2018-04-28 MED ORDER — OXYCODONE HCL ER 10 MG PO T12A: 10.0000 mg | EXTENDED_RELEASE_TABLET | Freq: Two times a day (BID) | ORAL | 0 refills | Status: AC

## 2018-04-28 MED ORDER — MIDAZOLAM HCL 2 MG/2ML IJ SOLN
INTRAMUSCULAR | Status: AC
  Filled 2018-04-28: qty 2

## 2018-04-28 MED ORDER — DEXAMETHASONE SODIUM PHOSPHATE 10 MG/ML IJ SOLN
10.0000 mg | Freq: Once | INTRAMUSCULAR | Status: AC
  Administered 2018-04-29: 10 mg via INTRAVENOUS
  Filled 2018-04-28: qty 1

## 2018-04-28 MED ORDER — DEXAMETHASONE SODIUM PHOSPHATE 10 MG/ML IJ SOLN
INTRAMUSCULAR | Status: DC | PRN
  Administered 2018-04-28: 4 mg via INTRAVENOUS

## 2018-04-28 MED ORDER — LACTATED RINGERS IV SOLN
INTRAVENOUS | Status: DC
  Administered 2018-04-28: 50 mL/h via INTRAVENOUS

## 2018-04-28 MED ORDER — METHOCARBAMOL 750 MG PO TABS: 750.0000 mg | ORAL_TABLET | Freq: Two times a day (BID) | ORAL | 0 refills | Status: DC | PRN

## 2018-04-28 MED ORDER — METHOCARBAMOL 500 MG PO TABS
500.0000 mg | ORAL_TABLET | Freq: Four times a day (QID) | ORAL | Status: DC | PRN
  Administered 2018-04-28 – 2018-05-02 (×3): 500 mg via ORAL
  Filled 2018-04-28 (×3): qty 1

## 2018-04-28 MED ORDER — GABAPENTIN 300 MG PO CAPS: 300.0000 mg | ORAL_CAPSULE | Freq: Three times a day (TID) | ORAL | Status: DC | PRN

## 2018-04-28 MED ORDER — ACETAMINOPHEN 500 MG PO TABS
1000.0000 mg | ORAL_TABLET | Freq: Four times a day (QID) | ORAL | Status: AC
  Administered 2018-04-29 (×2): 1000 mg via ORAL
  Filled 2018-04-28 (×3): qty 2

## 2018-04-28 MED ORDER — METOCLOPRAMIDE HCL 5 MG PO TABS: 5.0000 mg | ORAL_TABLET | Freq: Three times a day (TID) | ORAL | Status: DC | PRN

## 2018-04-28 MED ORDER — HYDROMORPHONE HCL 1 MG/ML IJ SOLN: 0.5000 mg | INTRAMUSCULAR | Status: DC | PRN

## 2018-04-28 MED ORDER — OXYCODONE HCL 5 MG PO TABS
10.0000 mg | ORAL_TABLET | ORAL | Status: DC | PRN
  Administered 2018-04-28 – 2018-05-01 (×3): 10 mg via ORAL
  Administered 2018-05-02: 15 mg via ORAL
  Filled 2018-04-28: qty 2
  Filled 2018-04-28: qty 3

## 2018-04-28 MED ORDER — OXYCODONE HCL 5 MG PO TABS
5.0000 mg | ORAL_TABLET | ORAL | Status: DC | PRN
  Administered 2018-04-29 – 2018-05-02 (×5): 10 mg via ORAL
  Filled 2018-04-28 (×7): qty 2

## 2018-04-28 MED ORDER — VANCOMYCIN HCL 1000 MG IV SOLR
INTRAVENOUS | Status: AC
  Filled 2018-04-28: qty 1000

## 2018-04-28 MED ORDER — ONDANSETRON HCL 4 MG/2ML IJ SOLN
INTRAMUSCULAR | Status: DC | PRN
  Administered 2018-04-28: 4 mg via INTRAVENOUS

## 2018-04-28 MED ORDER — HYDROMORPHONE HCL 1 MG/ML IJ SOLN
INTRAMUSCULAR | Status: AC
  Filled 2018-04-28: qty 1

## 2018-04-28 MED ORDER — MAGNESIUM CITRATE PO SOLN
1.0000 | Freq: Once | ORAL | Status: DC | PRN
Start: 2018-04-28 — End: 2018-05-03

## 2018-04-28 MED ORDER — GABAPENTIN 300 MG PO CAPS
300.0000 mg | ORAL_CAPSULE | Freq: Three times a day (TID) | ORAL | Status: DC
  Administered 2018-04-28 – 2018-05-02 (×14): 300 mg via ORAL
  Filled 2018-04-28 (×14): qty 1

## 2018-04-28 MED ORDER — DIPHENHYDRAMINE HCL 12.5 MG/5ML PO ELIX: 25.0000 mg | ORAL_SOLUTION | ORAL | Status: DC | PRN

## 2018-04-28 MED ORDER — DICYCLOMINE HCL 20 MG PO TABS
20.0000 mg | ORAL_TABLET | Freq: Two times a day (BID) | ORAL | Status: DC
  Administered 2018-04-28 – 2018-05-02 (×9): 20 mg via ORAL
  Filled 2018-04-28 (×9): qty 1

## 2018-04-28 MED ORDER — FENTANYL CITRATE (PF) 100 MCG/2ML IJ SOLN
50.0000 ug | Freq: Once | INTRAMUSCULAR | Status: AC
  Administered 2018-04-28: 50 ug via INTRAVENOUS

## 2018-04-28 MED ORDER — BUPIVACAINE LIPOSOME 1.3 % IJ SUSP
INTRAMUSCULAR | Status: DC | PRN
  Administered 2018-04-28: 20 mL

## 2018-04-28 MED ORDER — PROPOFOL 500 MG/50ML IV EMUL
INTRAVENOUS | Status: DC | PRN
  Administered 2018-04-28: 80 ug/kg/min via INTRAVENOUS

## 2018-04-28 SURGICAL SUPPLY — 75 items
ALCOHOL ISOPROPYL (RUBBING) (MISCELLANEOUS) ×3 IMPLANT
BAG DECANTER FOR FLEXI CONT (MISCELLANEOUS) ×3 IMPLANT
BANDAGE ESMARK 6X9 LF (GAUZE/BANDAGES/DRESSINGS) ×1 IMPLANT
BEARIN INSERT TIBIAL SZ5 9 (Orthopedic Implant) ×2 IMPLANT
BEARIN INSERT TIBIAL SZ5 9MM (Orthopedic Implant) ×1 IMPLANT
BEARING INSERT TIBIAL SZ5 9 (Orthopedic Implant) ×1 IMPLANT
BLADE SAW SGTL 13.0X1.19X90.0M (BLADE) ×3 IMPLANT
BNDG ELASTIC 6X10 VLCR STRL LF (GAUZE/BANDAGES/DRESSINGS) ×3 IMPLANT
BNDG ELASTIC 6X15 VLCR STRL LF (GAUZE/BANDAGES/DRESSINGS) ×3 IMPLANT
BNDG ESMARK 6X9 LF (GAUZE/BANDAGES/DRESSINGS) ×3
BOWL SMART MIX CTS (DISPOSABLE) ×3 IMPLANT
CLOSURE STERI-STRIP 1/2X4 (GAUZE/BANDAGES/DRESSINGS) ×1
CLSR STERI-STRIP ANTIMIC 1/2X4 (GAUZE/BANDAGES/DRESSINGS) ×2 IMPLANT
COVER SURGICAL LIGHT HANDLE (MISCELLANEOUS) ×3 IMPLANT
CUFF TOURNIQUET SINGLE 34IN LL (TOURNIQUET CUFF) ×3 IMPLANT
CUFF TOURNIQUET SINGLE 44IN (TOURNIQUET CUFF) IMPLANT
DRAPE EXTREMITY T 121X128X90 (DRAPE) ×3 IMPLANT
DRAPE HALF SHEET 40X57 (DRAPES) ×3 IMPLANT
DRAPE INCISE IOBAN 66X45 STRL (DRAPES) IMPLANT
DRAPE ORTHO SPLIT 77X108 STRL (DRAPES) ×4
DRAPE POUCH INSTRU U-SHP 10X18 (DRAPES) ×3 IMPLANT
DRAPE SURG ORHT 6 SPLT 77X108 (DRAPES) ×2 IMPLANT
DRAPE U-SHAPE 47X51 STRL (DRAPES) ×6 IMPLANT
DURAPREP 26ML APPLICATOR (WOUND CARE) ×6 IMPLANT
ELECT CAUTERY BLADE 6.4 (BLADE) ×3 IMPLANT
ELECT REM PT RETURN 9FT ADLT (ELECTROSURGICAL) ×3
ELECTRODE REM PT RTRN 9FT ADLT (ELECTROSURGICAL) ×1 IMPLANT
FEMORAL POSTERIOR SZ5 LFT (Femur) ×1 IMPLANT
GAUZE SPONGE 4X4 12PLY STRL LF (GAUZE/BANDAGES/DRESSINGS) ×3 IMPLANT
GAUZE XEROFORM 1X8 LF (GAUZE/BANDAGES/DRESSINGS) ×3 IMPLANT
GLOVE BIOGEL PI IND STRL 7.0 (GLOVE) ×1 IMPLANT
GLOVE BIOGEL PI INDICATOR 7.0 (GLOVE) ×2
GLOVE ECLIPSE 7.0 STRL STRAW (GLOVE) ×9 IMPLANT
GLOVE SKINSENSE NS SZ7.5 (GLOVE) ×2
GLOVE SKINSENSE STRL SZ7.5 (GLOVE) ×1 IMPLANT
GLOVE SURG SYN 7.5  E (GLOVE) ×8
GLOVE SURG SYN 7.5 E (GLOVE) ×4 IMPLANT
GOWN STRL REIN XL XLG (GOWN DISPOSABLE) ×3 IMPLANT
GOWN STRL REUS W/ TWL LRG LVL3 (GOWN DISPOSABLE) ×1 IMPLANT
GOWN STRL REUS W/TWL LRG LVL3 (GOWN DISPOSABLE) ×2
HANDPIECE INTERPULSE COAX TIP (DISPOSABLE) ×2
HOOD PEEL AWAY FLYTE STAYCOOL (MISCELLANEOUS) ×6 IMPLANT
KIT BASIN OR (CUSTOM PROCEDURE TRAY) ×3 IMPLANT
KIT TURNOVER KIT B (KITS) ×3 IMPLANT
KNEE PATELLA ASYMMETRIC 10X32 (Knees) ×3 IMPLANT
KNEE TIBIAL COMPONENT SZ5 (Knees) ×3 IMPLANT
MANIFOLD NEPTUNE II (INSTRUMENTS) ×3 IMPLANT
MARKER SKIN DUAL TIP RULER LAB (MISCELLANEOUS) ×3 IMPLANT
NEEDLE SPNL 18GX3.5 QUINCKE PK (NEEDLE) ×6 IMPLANT
NS IRRIG 1000ML POUR BTL (IV SOLUTION) ×3 IMPLANT
PACK TOTAL JOINT (CUSTOM PROCEDURE TRAY) ×3 IMPLANT
PAD ABD 8X10 STRL (GAUZE/BANDAGES/DRESSINGS) ×6 IMPLANT
PAD ARMBOARD 7.5X6 YLW CONV (MISCELLANEOUS) ×6 IMPLANT
PAD CAST 4YDX4 CTTN HI CHSV (CAST SUPPLIES) ×1 IMPLANT
PADDING CAST COTTON 4X4 STRL (CAST SUPPLIES) ×2
PADDING CAST COTTON 6X4 STRL (CAST SUPPLIES) ×3 IMPLANT
POSTERIOR FEMORAL SZ5 LFT (Femur) ×3 IMPLANT
SAW OSC TIP CART 19.5X105X1.3 (SAW) ×3 IMPLANT
SET HNDPC FAN SPRY TIP SCT (DISPOSABLE) ×1 IMPLANT
STAPLER VISISTAT 35W (STAPLE) IMPLANT
SUCTION FRAZIER HANDLE 10FR (MISCELLANEOUS) ×2
SUCTION TUBE FRAZIER 10FR DISP (MISCELLANEOUS) ×1 IMPLANT
SUT ETHILON 2 0 FS 18 (SUTURE) IMPLANT
SUT MNCRL AB 4-0 PS2 18 (SUTURE) IMPLANT
SUT VIC AB 0 CT1 27 (SUTURE) ×4
SUT VIC AB 0 CT1 27XBRD ANBCTR (SUTURE) ×2 IMPLANT
SUT VIC AB 1 CTX 27 (SUTURE) ×9 IMPLANT
SUT VIC AB 2-0 CT1 27 (SUTURE) ×6
SUT VIC AB 2-0 CT1 TAPERPNT 27 (SUTURE) ×3 IMPLANT
SYR 50ML LL SCALE MARK (SYRINGE) ×6 IMPLANT
TOWEL OR 17X24 6PK STRL BLUE (TOWEL DISPOSABLE) ×3 IMPLANT
TOWEL OR 17X26 10 PK STRL BLUE (TOWEL DISPOSABLE) ×3 IMPLANT
TRAY CATH 16FR W/PLASTIC CATH (SET/KITS/TRAYS/PACK) IMPLANT
UNDERPAD 30X30 (UNDERPADS AND DIAPERS) ×3 IMPLANT
WRAP KNEE MAXI GEL POST OP (GAUZE/BANDAGES/DRESSINGS) ×3 IMPLANT

## 2018-04-28 NOTE — Transfer of Care (Signed)
Immediate Anesthesia Transfer of Care Note  Patient: David Dunlap  Procedure(s) Performed: LEFT TOTAL KNEE ARTHROPLASTY (Left Knee)  Patient Location: PACU  Anesthesia Type:MAC and Spinal  Level of Consciousness: awake, alert  and oriented  Airway & Oxygen Therapy: Patient Spontanous Breathing and Patient connected to face mask oxygen  Post-op Assessment: Report given to RN and Post -op Vital signs reviewed and stable  Post vital signs: Reviewed and stable  Last Vitals:  Vitals Value Taken Time  BP 116/68 04/28/2018  1:10 PM  Temp    Pulse 87 04/28/2018  1:11 PM  Resp 19 04/28/2018  1:11 PM  SpO2 100 % 04/28/2018  1:11 PM  Vitals shown include unvalidated device data.  Last Pain:  Vitals:   04/28/18 1035  TempSrc:   PainSc: 7          Complications: No apparent anesthesia complications

## 2018-04-28 NOTE — Progress Notes (Signed)
Orthopedic Tech Progress Note Patient Details:  [name] Birkeland [birth year] [id number]  CPM Left Knee CPM Left Knee: On Left Knee Flexion (Degrees): 90 Left Knee Extension (Degrees): 0 Additional Comments: trapeze bar patient helper Viewed order from doctor's order list Post Interventions Patient Tolerated: Well Instructions Provided: Care of device  Nikki DomCrawford, Rehanna Oloughlin 04/28/2018, 1:55 PM

## 2018-04-28 NOTE — Anesthesia Procedure Notes (Addendum)
Spinal  Patient location during procedure: OR Start time: 04/28/2018 10:36 AM End time: 04/28/2018 10:46 AM Staffing Anesthesiologist: Elmer PickerWoodrum, Trameka Dorough L, MD Performed: anesthesiologist  Preanesthetic Checklist Completed: patient identified, surgical consent, pre-op evaluation, timeout performed, IV checked, risks and benefits discussed and monitors and equipment checked Spinal Block Patient position: sitting Prep: DuraPrep Patient monitoring: heart rate, continuous pulse ox and blood pressure Approach: midline Location: L3-4 Injection technique: single-shot Needle Needle type: Pencan  Needle gauge: 24 G Needle length: 9 cm Assessment Sensory level: T6 Additional Notes Discussed risks and benefits of and difference between general anesthesia and spinal anesthesia. Discussed risks of spinal including headache, backache, failure, bleeding, infection, and nerve damage. Patient consents to spinal. Questions answered. Coagulation studies and platelet count acceptable.

## 2018-04-28 NOTE — Evaluation (Signed)
Physical Therapy Evaluation Patient Details Name: David Dunlap MRN: [id number] DOB: October 11, 1970 Today's Date: 04/28/2018   History of Present Illness  Nobuo Nunziata is a 47 y.o M s/p L TKR. PMH includes R TKR, Sleep Apnea w/ CPAP, HTN, PVD + DVT, GERD, T2DM, Arthritis, Depression, Sickle Cell Anemia, Cholecystectomy.   Clinical Impression  Pt presents with problems above and deficits below. Pt was educated on precautions and supine HEP. Pt performed bed mobility with Supervision. Pt performed sit<>stand transfers and ambulated with RW with MinG-MinA. Further mobility was deferred due to pain. Pt reports he may be homeless upon d/c. Given deficits, and lack of home environment, may benefit from short-term SNF. Will continue to follow acutely to support independence, mobility, and safety.     Follow Up Recommendations Follow surgeon's recommendation for DC plan and follow-up therapies;Supervision for mobility/OOB    Equipment Recommendations  Rolling walker with 5" wheels;3in1 (PT)    Recommendations for Other Services OT consult     Precautions / Restrictions Precautions Precautions: Knee;Fall Precaution Booklet Issued: Yes (comment) Precaution Comments: Pt educated on precautions and supine HEP Restrictions Weight Bearing Restrictions: Yes LLE Weight Bearing: Weight bearing as tolerated      Mobility  Bed Mobility Overal bed mobility: Needs Assistance Bed Mobility: Supine to Sit     Supine to sit: HOB elevated;Supervision     General bed mobility comments: Pt required increased time to sit up at EOB. Pt was able to move L LE without physical assist, but reported increased pain with mobility. Pt was able to adjust hips at EOB. Pt had difficulty but required no physical assist.   Transfers Overall transfer level: Needs assistance Equipment used: Rolling walker (2 wheeled) Transfers: Sit to/from Stand Sit to Stand: Min assist         General transfer comment: Pt  required MinA for sit<>stand transfers. Pt required MinA for steadying once standing. Pt reported no dizziness with standing. Pt required cues for hand placement and sequencing.  Ambulation/Gait Ambulation/Gait assistance: Min guard Gait Distance (Feet): 20 [id number]) Assistive device: Rolling walker (2 wheeled) Gait Pattern/deviations: Step-to pattern;Decreased stride length;Decreased weight shift to left;Decreased stance time - left;Decreased step length - right;Antalgic;Wide base of support Gait velocity: Decreased   General Gait Details: Pt had antalgic gait and reduced weight shift to the L. Pt grimaced whenever putting weight on L LE. Pt had step-to pattern, and required increased time for ambulation with RW and MinG.   Stairs            Wheelchair Mobility    Modified Rankin (Stroke Patients Only)       Balance Overall balance assessment: Needs assistance Sitting-balance support: Feet supported;Single extremity supported Sitting balance-Leahy Scale: Poor Sitting balance - Comments: Pt was able to scoot hips to EOB, but relied on Unilateral UE support. Pt relied on Unilateral UE support when PT put on gait belt.    Standing balance support: No upper extremity supported;Bilateral upper extremity supported Standing balance-Leahy Scale: Fair Standing balance comment: Pt reliant on RW for balance during dynamic activities. Pt was able to toilet without UE support and wash hands while static standing, but swayed during static standing.                             Pertinent Vitals/Pain Pain Assessment: 0-10 Pain Score: 8  Pain Location: L knee Pain Descriptors / Indicators: Guarding;Grimacing;Sharp Pain Intervention(s): Monitored during session;Limited activity within patient's tolerance;Repositioned  Home Living Family/patient expects to be discharged to:: Unsure Living Arrangements: Other (Comment)(Unsure)               Additional Comments: Pt  reports he was living with friends, and that after today, he no longer has a home.     Prior Function Level of Independence: Independent;Independent with assistive device(s)         Comments: Pt reports he would ocassinally use a cane.      Hand Dominance   Dominant Hand: Right    Extremity/Trunk Assessment   Upper Extremity Assessment Upper Extremity Assessment: Defer to OT evaluation    Lower Extremity Assessment Lower Extremity Assessment: LLE deficits/detail;RLE deficits/detail RLE Deficits / Details: Pt reports some tingling and decreased sensation in R foot that's been baseline since last R TKR.  RLE Sensation: decreased light touch LLE Deficits / Details: Pt presents with pain and deficits as expected s/p L TKR. Pt reports cramping in quads after CPM session.  LLE Sensation: WNL    Cervical / Trunk Assessment Cervical / Trunk Assessment: Normal  Communication   Communication: No difficulties  Cognition Arousal/Alertness: Awake/alert Behavior During Therapy: WFL for tasks assessed/performed Overall Cognitive Status: Within Functional Limits for tasks assessed                                        General Comments      Exercises Total Joint Exercises Ankle Circles/Pumps: AROM;Both;20 reps;Supine;Limitations Ankle Circles/Pumps Limitations: Pt had difficulty coordinating L LE toes into dorsiflexion.  Quad Sets: AROM;Left;10 reps;Supine Heel Slides: AROM;Left;10 reps;Supine   Assessment/Plan    PT Assessment Patient needs continued PT services  PT Problem List Decreased strength;Decreased range of motion;Decreased activity tolerance;Decreased balance;Decreased mobility;Decreased knowledge of use of DME;Decreased knowledge of precautions;Pain;Impaired sensation       PT Treatment Interventions DME instruction;Gait training;Stair training;Functional mobility training;Therapeutic activities;Therapeutic exercise;Balance training;Patient/family  education    PT Goals (Current goals can be found in the Care Plan section)  Acute Rehab PT Goals Patient Stated Goal: To get back to walking without pain PT Goal Formulation: With patient Time For Goal Achievement: 05/12/18 Potential to Achieve Goals: Good    Frequency 7X/week   Barriers to discharge Other (comment) unsure of pt's home environment     Co-evaluation               AM-PAC PT "6 Clicks" Daily Activity  Outcome Measure Difficulty turning over in bed (including adjusting bedclothes, sheets and blankets)?: A Little Difficulty moving from lying on back to sitting on the side of the bed? : A Little Difficulty sitting down on and standing up from a chair with arms (e.g., wheelchair, bedside commode, etc,.)?: Unable Help needed moving to and from a bed to chair (including a wheelchair)?: A Little Help needed walking in hospital room?: A Little Help needed climbing 3-5 steps with a railing? : A Lot 6 Click Score: 15    End of Session Equipment Utilized During Treatment: Gait belt Activity Tolerance: Patient limited by pain Patient left: in chair;with call bell/phone within reach Nurse Communication: Mobility status PT Visit Diagnosis: Unsteadiness on feet (R26.81);Other abnormalities of gait and mobility (R26.89);Muscle weakness (generalized) (M62.81);Difficulty in walking, not elsewhere classified (R26.2);Pain Pain - Right/Left: Left Pain - part of body: Knee    Time: [id number] PT Time Calculation (min) (ACUTE ONLY): 29 min   Charges:   PT Evaluation $PT  Eval Low Complexity: 1 Low PT Treatments $Therapeutic Activity: 8-22 mins   Demetria PoreJulia Demaris Leavell, S-DPT Acute Care Rehab Student [phone]     04/28/2018, 6:12 PM

## 2018-04-28 NOTE — Anesthesia Procedure Notes (Signed)
Anesthesia Regional Block: Adductor canal block   Pre-Anesthetic Checklist: ,, timeout performed, Correct Patient, Correct Site, Correct Laterality, Correct Procedure, Correct Position, site marked, Risks and benefits discussed,  Surgical consent,  Pre-op evaluation,  At surgeon's request and post-op pain management  Laterality: Left  Prep: Maximum Sterile Barrier Precautions used, chloraprep       Needles:  Injection technique: Single-shot  Needle Type: Echogenic Stimulator Needle     Needle Length: 9cm  Needle Gauge: 22     Additional Needles:   Procedures:,,,, ultrasound used (permanent image in chart),,,,   Nerve Stimulator or Paresthesia:  Response: Patellar respose,   Additional Responses:   Narrative:  Start time: 04/28/2018 10:23 AM End time: 04/28/2018 10:33 AM Injection made incrementally with aspirations every 5 mL.  Performed by: Personally  Anesthesiologist: Elmer PickerWoodrum, Chelsey L, MD  Additional Notes: Monitors applied. No increased pain on injection. No increased resistance to injection. Injection made in 5cc increments. Good needle visualization. Patient tolerated procedure well.

## 2018-04-28 NOTE — OR Nursing (Signed)
1330:  In&out cath=1000cc cyu, per protocol, no trauma.

## 2018-04-28 NOTE — Anesthesia Preprocedure Evaluation (Addendum)
Anesthesia Evaluation  Patient identified by MRN, date of birth, ID band Patient awake    Reviewed: Allergy & Precautions, NPO status , Patient's Chart, lab work & pertinent test results  Airway Mallampati: II  TM Distance: >3 FB Neck ROM: Full    Dental  (+) Teeth Intact   Pulmonary sleep apnea and Continuous Positive Airway Pressure Ventilation ,    breath sounds clear to auscultation       Cardiovascular hypertension, Pt. on medications + Peripheral Vascular Disease and + DVT   Rhythm:Regular Rate:Normal     Neuro/Psych Depression negative neurological ROS     GI/Hepatic Neg liver ROS, GERD  Medicated and Controlled,  Endo/Other  diabetes, Well Controlled, Type 2, Oral Hypoglycemic Agents  Renal/GU negative Renal ROS  negative genitourinary   Musculoskeletal  (+) Arthritis ,   Abdominal   Peds negative pediatric ROS (+)  Hematology  (+) Sickle cell trait ,   Anesthesia Other Findings On xarelto, last dose 04/24/18  Reproductive/Obstetrics negative OB ROS                          Anesthesia Physical Anesthesia Plan  ASA: III  Anesthesia Plan: Regional and Spinal   Post-op Pain Management:    Induction: Intravenous  PONV Risk Score and Plan: 1 and Propofol infusion, Dexamethasone, Ondansetron and Midazolam  Airway Management Planned: Natural Airway and Simple Face Mask  Additional Equipment:   Intra-op Plan:   Post-operative Plan:   Informed Consent: I have reviewed the patients History and Physical, chart, labs and discussed the procedure including the risks, benefits and alternatives for the proposed anesthesia with the patient or authorized representative who has indicated his/her understanding and acceptance.   Dental advisory given  Plan Discussed with: CRNA  Anesthesia Plan Comments:         Anesthesia Quick Evaluation

## 2018-04-28 NOTE — Op Note (Signed)
Total Knee Arthroplasty Procedure Note  Preoperative diagnosis: Left knee osteoarthritis  Postoperative diagnosis:same  Operative procedure: Left total knee arthroplasty. CPT [phone]  Surgeon: N. Glee ArvinMichael Terron Merfeld, MD  Assist: Oneal GroutMary Lindsey Stanbery, PA-C; necessary for the timely completion of procedure and due to complexity of procedure.  Anesthesia: Spinal, regional  Tourniquet time: 60 mins  Implants used: [employer] Triatholon Uncemented Femur: PS 5 Tibia: 5 Patella: 32 mm Polyethylene: 9 mm  Indication: David Dunlap is a 47 y.o. year old male with a history of knee pain. Having failed conservative management, the patient elected to proceed with a total knee arthroplasty.  We have reviewed the risk and benefits of the surgery and they elected to proceed after voicing understanding.  Procedure:  After informed consent was obtained and understanding of the risk were voiced including but not limited to bleeding, infection, damage to surrounding structures including nerves and vessels, blood clots, leg length inequality and the failure to achieve desired results, the operative extremity was marked with verbal confirmation of the patient in the holding area.   The patient was then brought to the operating room and transported to the operating room table in the supine position.  A tourniquet was applied to the operative extremity around the upper thigh. The operative limb was then prepped and draped in the usual sterile fashion and preoperative antibiotics were administered.  A time out was performed prior to the start of surgery confirming the correct extremity, preoperative antibiotic administration, as well as team members, implants and instruments available for the case. Correct surgical site was also confirmed with preoperative radiographs. The limb was then elevated for exsanguination and the tourniquet was inflated. A midline incision was made and a standard medial parapatellar  approach was performed.  The patella was prepared and sized to a 32 mm.  A cover was placed on the patella for protection from retractors.  We then turned our attention to the femur. Posterior cruciate ligament was sacrificed. Start site was drilled in the femur and the intramedullary distal femoral cutting guide was placed, set at 5 degrees valgus, taking 11 mm of distal resection. The distal cut was made. Osteophytes were then removed. Next, the proximal tibial cutting guide was placed with appropriate slope, varus/valgus alignment and depth of resection. The proximal tibial cut was made. Gap blocks were then used to assess the extension gap and alignment, and appropriate soft tissue releases were performed. Attention was turned back to the femur, which was sized using the sizing guide to a size 5. Appropriate rotation of the femoral component was determined using epicondylar axis, Whiteside's line, and assessing the flexion gap under ligament tension. The appropriate size 4-in-1 cutting block was placed and cuts were made. Posterior femoral osteophytes and uncapped bone were then removed with the curved osteotome. The tibia was sized for a size 5 component. The femoral box-cutting guide was placed and prepared for a PS femoral component. Trial components were placed, and stability was checked in full extension, mid-flexion, and deep flexion. Proper tibial rotation was determined and marked.  The patella tracked well without a lateral release. Trial components were then removed and tibial preparation performed. A posterior capsular injection comprising of 20 cc of 1.3% exparel and 40 cc of normal saline was performed for postoperative pain control. The bony surfaces were irrigated with a pulse lavage and then dried. The final components sized above were malleted into place. The stability of the construct was re-evaluated throughout a range of motion and  found to be acceptable. The trial liner was removed, the  knee was copiously irrigated, and the knee was re-evaluated for any excess bone debris. The real polyethylene liner, 9 mm thick, was inserted and checked to ensure the locking mechanism had engaged appropriately. The tourniquet was deflated and hemostasis was achieved. The wound was irrigated with normal saline.  One gram of vancomycin powder was placed in the surgical bed.  A drain was not placed.  Capsular closure was performed with a #1 vicryl, subcutaneous fat closed with a 0 vicryl suture, then subcutaneous tissue closed with interrupted 2.0 vicryl suture. The skin was then closed with a 3.0 monocryl. A sterile dressing was applied.  The patient was awakened in the operating room and taken to recovery in stable condition. All sponge, needle, and instrument counts were correct at the end of the case.  Position: supine  Complications: none.  Time Out: performed   Drains/Packing: none  Estimated blood loss: minimal  Returned to Recovery Room: in good condition.   Antibiotics: yes   Mechanical VTE (DVT) Prophylaxis: sequential compression devices, TED thigh-high  Chemical VTE (DVT) Prophylaxis: xarelto  Fluid Replacement  Crystalloid: see anesthesia record Blood: none  FFP: none   Specimens Removed: 1 to pathology   Sponge and Instrument Count Correct? yes   PACU: portable radiograph - knee AP and Lateral   Admission: inpatient status  Plan/RTC: Return in 2 weeks for wound check.   Weight Bearing/Load Lower Extremity: full   N. Glee ArvinMichael Ivory Bail, MD [facility] [phone] 12:26 PM

## 2018-04-28 NOTE — H&P (Signed)
PREOPERATIVE H&P  Chief Complaint: left knee degenerative joint disease  HPI: David Dunlap is a 47 y.o. male who presents for surgical treatment of left knee degenerative joint disease.  He denies any changes in medical history.  Past Medical History:  Diagnosis Date  . Arthritis   . Depression   . Diabetes mellitus without complication (HCC) 05/2016   NEW ONSET    WITH FAMILY HISTORY  . DVT (deep venous thrombosis) (HCC)   . Essential hypertension   . Gallstones 05/2016  . GERD (gastroesophageal reflux disease)   . Sickle cell anemia (HCC)    Sickle trait  . Sleep apnea    Cpap   Past Surgical History:  Procedure Laterality Date  . CHOLECYSTECTOMY N/A 06/07/2016   Procedure: LAPAROSCOPIC CHOLECYSTECTOMY WITH ATTEMPTED INTRAOPERATIVE CHOLANGIOGRAM;  Surgeon: Violeta GelinasBurke Thompson, MD;  Location: [facility] OR;  Service: General;  Laterality: N/A;  . LIGAMENT REPAIR     R forearm  . TOTAL KNEE ARTHROPLASTY Right 04/17/2017   Procedure: RIGHT TOTAL KNEE ARTHROPLASTY;  Surgeon: Tarry KosXu, Naiping M, MD;  Location: [facility] OR;  Service: Orthopedics;  Laterality: Right;   Social History   Socioeconomic History  . Marital status: Single    Spouse name: Not on file  . Number of children: Not on file  . Years of education: Not on file  . Highest education level: Not on file  Occupational History  . Not on file  Social Needs  . Financial resource strain: Not on file  . Food insecurity:    Worry: Not on file    Inability: Not on file  . Transportation needs:    Medical: Not on file    Non-medical: Not on file  Tobacco Use  . Smoking status: Never Smoker  . Smokeless tobacco: Never Used  Substance and Sexual Activity  . Alcohol use: Yes    Alcohol/week: 1.0 - 2.0 standard drinks    Types: 1 - 2 Cans of beer per week    Comment: doesn't drink all the drink  . Drug use: No    Types: Marijuana    Comment: last time 1989  . Sexual activity: Not on file  Lifestyle  . Physical activity:    Days per week: Not on file    Minutes per session: Not on file  . Stress: Not on file  Relationships  . Social connections:    Talks on phone: Not on file    Gets together: Not on file    Attends religious service: Not on file    Active member of club or organization: Not on file    Attends meetings of clubs or organizations: Not on file    Relationship status: Not on file  Other Topics Concern  . Not on file  Social History Narrative  . Not on file   Family History  Problem Relation Age of Onset  . Diabetes Mother   . Diabetes Father   . Hypertension Sister    No Known Allergies Prior to Admission medications   Medication Sig Start Date End Date Taking? Authorizing Provider  atorvastatin (LIPITOR) 20 MG tablet Take 1 tablet (20 mg total) by mouth daily. 12/04/17  Yes Newlin, Odette HornsEnobong, MD  cetirizine (ZYRTEC) 10 MG tablet TAKE 1 TABLET BY MOUTH DAILY. 02/11/18  Yes Hoy RegisterNewlin, Enobong, MD  cyclobenzaprine (FLEXERIL) 10 MG tablet Take 1 tablet (10 mg total) by mouth 2 (two) times daily as needed for muscle spasms. 01/30/18  Yes Law, Waylan BogaAlexandra M, PA-C  dicyclomine (BENTYL) 20 MG tablet Take 1 tablet (20 mg total) by mouth 2 (two) times daily. 11/04/17  Yes Kirichenko, Tatyana, PA-C  FLUoxetine (PROZAC) 20 MG tablet Take 2 tablets (40 mg total) by mouth daily. 12/04/17  Yes Hoy RegisterNewlin, Enobong, MD  gabapentin (NEURONTIN) 300 MG capsule Take 1 capsule (300 mg total) by mouth 3 (three) times daily. Patient taking differently: Take 300 mg by mouth 3 (three) times daily as needed (pain).  12/04/17  Yes Newlin, Odette HornsEnobong, MD  glipiZIDE (GLUCOTROL) 5 MG tablet Take 1 tablet (5 mg total) by mouth 2 (two) times daily before a meal. Patient taking differently: Take 5 mg by mouth daily.  12/04/17  Yes Hoy RegisterNewlin, Enobong, MD  hydrochlorothiazide (HYDRODIURIL) 25 MG tablet Take 1 tablet (25 mg total) by mouth daily. 12/04/17  Yes Hoy RegisterNewlin, Enobong, MD  lisinopril (PRINIVIL,ZESTRIL) 10 MG tablet Take 1 tablet (10 mg  total) by mouth daily. 12/04/17  Yes Hoy RegisterNewlin, Enobong, MD  metFORMIN (GLUCOPHAGE) 500 MG tablet Take 2 tablets (1,000 mg total) by mouth 2 (two) times daily with a meal. 12/04/17  Yes Newlin, Enobong, MD  rivaroxaban (XARELTO) 20 MG TABS tablet Take 1 tablet (20 mg total) by mouth daily with supper. Patient taking differently: Take 20 mg by mouth daily.  09/04/17  Yes Hoy RegisterNewlin, Enobong, MD  sildenafil (VIAGRA) 50 MG tablet Take 1 tablet (50 mg total) by mouth daily as needed for erectile dysfunction. At least 24 hours between doses 12/04/17  Yes Newlin, Enobong, MD  traMADol (ULTRAM) 50 MG tablet TAKE 1-2 TABS PO Q6-8 HOURS PRN PAIN Patient taking differently: Take 50-100 mg by mouth every 6 (six) hours as needed for severe pain.  04/10/18  Yes Tarry KosXu, Naiping M, MD  acetaminophen (TYLENOL) 500 MG tablet Take 1 tablet (500 mg total) by mouth every 6 (six) hours as needed. Patient not taking: Reported on 04/17/2018 01/30/18   Emi HolesLaw, Alexandra M, PA-C  Blood Glucose Monitoring Suppl (TRUE METRIX METER) DEVI 1 each by Does not apply route 3 (three) times daily before meals. 07/25/16   Hoy RegisterNewlin, Enobong, MD  diphenoxylate-atropine (LOMOTIL) 2.5-0.025 MG tablet Take 1 tablet by mouth 4 (four) times daily as needed for diarrhea or loose stools. 11/13/17   Gilda CreasePollina, Christopher J, MD  glucose blood (TRUE METRIX BLOOD GLUCOSE TEST) test strip Used daily before meals 07/25/16   Hoy RegisterNewlin, Enobong, MD  ondansetron (ZOFRAN) 4 MG tablet Take 1 tablet (4 mg total) by mouth every 6 (six) hours. Patient not taking: Reported on 04/17/2018 11/04/17   Jaynie CrumbleKirichenko, Tatyana, PA-C  TRUEPLUS LANCETS 28G MISC Use daily before meals 07/25/16   Hoy RegisterNewlin, Enobong, MD     Positive ROS: All other systems have been reviewed and were otherwise negative with the exception of those mentioned in the HPI and as above.  Physical Exam: General: Alert, no acute distress Cardiovascular: No pedal edema Respiratory: No cyanosis, no use of accessory  musculature GI: abdomen soft Skin: No lesions in the area of chief complaint Neurologic: Sensation intact distally Psychiatric: Patient is competent for consent with normal mood and affect Lymphatic: no lymphedema  MUSCULOSKELETAL: exam stable  Assessment: left knee degenerative joint disease  Plan: Plan for Procedure(s): LEFT TOTAL KNEE ARTHROPLASTY  The risks benefits and alternatives were discussed with the patient including but not limited to the risks of nonoperative treatment, versus surgical intervention including infection, bleeding, nerve injury,  blood clots, cardiopulmonary complications, morbidity, mortality, among others, and they were willing to proceed.   Preoperative templating of the  joint replacement has been completed, documented, and submitted to the Operating Room personnel in order to optimize intra-operative equipment management.  Anticipated LOS equal to or greater than 2 midnights due to - Age 49 and older with one or more of the following:  - Obesity  - Expected need for hospital services (PT, OT, Nursing) required for safe  discharge  - Anticipated need for postoperative skilled nursing care or inpatient rehab  - Active co-morbidities: DVT/VTE  Glee Arvin, MD   04/28/2018 10:21 AM

## 2018-04-28 NOTE — Progress Notes (Signed)
RT set up CPAP and placed on patient. Patient tolerating well at this time. RT will monitor as needed.  

## 2018-04-28 NOTE — Anesthesia Postprocedure Evaluation (Signed)
Anesthesia Post Note  Patient: David Dunlap  Procedure(s) Performed: LEFT TOTAL KNEE ARTHROPLASTY (Left Knee)     Patient location during evaluation: PACU Anesthesia Type: Spinal Level of consciousness: oriented and awake and alert Pain management: pain level controlled Vital Signs Assessment: post-procedure vital signs reviewed and stable Respiratory status: spontaneous breathing, respiratory function stable and patient connected to nasal cannula oxygen Cardiovascular status: blood pressure returned to baseline and stable Postop Assessment: no headache, no backache and no apparent nausea or vomiting Anesthetic complications: no    Last Vitals:  Vitals:   04/28/18 1409 04/28/18 1442  BP: (!) 121/93 (!) 134/93  Pulse: 66 66  Resp: 14 15  Temp: (!) 36.2 C (!) 36.4 C  SpO2: 99% 99%    Last Pain:  Vitals:   04/28/18 1641  TempSrc:   PainSc: 4                  Chelsey L Woodrum

## 2018-04-29 ENCOUNTER — Encounter (HOSPITAL_COMMUNITY): Payer: Self-pay | Admitting: Orthopaedic Surgery

## 2018-04-29 LAB — CBC
HEMATOCRIT: 40.5 % (ref 39.0–52.0)
HEMOGLOBIN: 14.2 g/dL (ref 13.0–17.0)
MCH: 28.5 pg (ref 26.0–34.0)
MCHC: 35.1 g/dL (ref 30.0–36.0)
MCV: 81.3 fL (ref 78.0–100.0)
Platelets: 162 10*3/uL (ref 150–400)
RBC: 4.98 MIL/uL (ref 4.22–5.81)
RDW: 12.7 % (ref 11.5–15.5)
WBC: 16 10*3/uL — AB (ref 4.0–10.5)

## 2018-04-29 LAB — BASIC METABOLIC PANEL
ANION GAP: 10 (ref 5–15)
BUN: 15 mg/dL (ref 6–20)
CHLORIDE: 102 mmol/L (ref 98–111)
CO2: 23 mmol/L (ref 22–32)
Calcium: 8.3 mg/dL — ABNORMAL LOW (ref 8.9–10.3)
Creatinine, Ser: 1.12 mg/dL (ref 0.61–1.24)
GFR calc Af Amer: >60 mL/min (ref >60)
GLUCOSE: 123 mg/dL — AB (ref 70–99)
POTASSIUM: 4.2 mmol/L (ref 3.5–5.1)
Sodium: 135 mmol/L (ref 135–145)

## 2018-04-29 LAB — GLUCOSE, CAPILLARY
GLUCOSE-CAPILLARY: 388 mg/dL — AB (ref 70–99)
Glucose-Capillary: 187 mg/dL — ABNORMAL HIGH (ref 70–99)
Glucose-Capillary: 263 mg/dL — ABNORMAL HIGH (ref 70–99)
Glucose-Capillary: 245 mg/dL — ABNORMAL HIGH (ref 70–99)

## 2018-04-29 NOTE — Progress Notes (Signed)
Physical Therapy Treatment Patient Details Name: David Dunlap MRN: [id number] DOB: November 14, 1970 Today's Date: 04/29/2018    History of Present Illness David Dunlap is a 47 y.o M s/p L TKR. PMH includes R TKR, Sleep Apnea w/ CPAP, HTN, PVD + DVT, GERD, T2DM, Arthritis, Depression, Sickle Cell Anemia, Cholecystectomy.     PT Comments    Pt continues to have pain and fatigue limiting mobility. Ambulated 150' with RW and min-guard A. Pt does not yet have d/c plan but based on mobility today, would benefit from PT tomorrow before going home. PT will continue to follow.    Follow Up Recommendations  Follow surgeon's recommendation for DC plan and follow-up therapies;Supervision for mobility/OOB     Equipment Recommendations  Rolling walker with 5" wheels;3in1 (PT)    Recommendations for Other Services       Precautions / Restrictions Precautions Precautions: Knee;Fall Precaution Booklet Issued: Yes (comment) Precaution Comments: Pt educated concerning knee precautions related to ADL.  Restrictions Weight Bearing Restrictions: Yes LLE Weight Bearing: Weight bearing as tolerated    Mobility  Bed Mobility Overal bed mobility: Needs Assistance Bed Mobility: Sit to Supine       Sit to supine: Min assist   General bed mobility comments: min A to LLE for elevation into bed  Transfers Overall transfer level: Needs assistance Equipment used: Rolling walker (2 wheeled) Transfers: Sit to/from Stand Sit to Stand: Min guard         General transfer comment: Min guard assist as pt has difficulty transferring hands from chair to RW  Ambulation/Gait Ambulation/Gait assistance: Min guard Gait Distance (Feet): 150 Feet Assistive device: Rolling walker (2 wheeled) Gait Pattern/deviations: Step-to pattern;Decreased stride length;Decreased weight shift to left;Decreased stance time - left;Decreased step length - right;Antalgic;Wide base of support Gait velocity:  Decreased Gait velocity interpretation: <1.8 ft/sec, indicate of risk for recurrent falls General Gait Details: pt fatigued quickly with gait this afternoon, planned to practice stairs but he was not ready for this.    Stairs             Wheelchair Mobility    Modified Rankin (Stroke Patients Only)       Balance Overall balance assessment: Needs assistance Sitting-balance support: Feet supported;Single extremity supported Sitting balance-Leahy Scale: Good     Standing balance support: Bilateral upper extremity supported;No upper extremity supported;During functional activity Standing balance-Leahy Scale: Fair Standing balance comment: Able to statically stand without UE support.                             Cognition Arousal/Alertness: Awake/alert Behavior During Therapy: WFL for tasks assessed/performed Overall Cognitive Status: Within Functional Limits for tasks assessed                                        Exercises Total Joint Exercises Ankle Circles/Pumps: AROM;Both;20 reps;Supine Goniometric ROM: 0-80    General Comments General comments (skin integrity, edema, etc.): Pt unsure of his discharge plan. Ice packs off of pt but not in freezer. Returned to freezer and placed ice bag until packs frozen and notified nurse tech.      Pertinent Vitals/Pain Pain Assessment: Faces Pain Score: 7  Faces Pain Scale: Hurts even more Pain Location: L knee Pain Descriptors / Indicators: Guarding;Grimacing;Sharp Pain Intervention(s): Limited activity within patient's tolerance;Monitored during session;Premedicated before session  Home Living Family/patient expects to be discharged to:: Unsure Living Arrangements: Other (Comment)(unsure)             Additional Comments: Pt has been living with friends. He will not be able to stay there on d/c. He may be able to stay with brother-in-law but unsure.     Prior Function Level of  Independence: Independent;Independent with assistive device(s)      Comments: Occasionally using a cane PTA.    PT Goals (current goals can now be found in the care plan section) Acute Rehab PT Goals Patient Stated Goal: To get back to walking without pain PT Goal Formulation: With patient Time For Goal Achievement: 05/12/18 Potential to Achieve Goals: Good Progress towards PT goals: Progressing toward goals    Frequency    7X/week      PT Plan Current plan remains appropriate    Co-evaluation              AM-PAC PT "6 Clicks" Daily Activity  Outcome Measure  Difficulty turning over in bed (including adjusting bedclothes, sheets and blankets)?: A Little Difficulty moving from lying on back to sitting on the side of the bed? : A Little Difficulty sitting down on and standing up from a chair with arms (e.g., wheelchair, bedside commode, etc,.)?: A Little Help needed moving to and from a bed to chair (including a wheelchair)?: A Little Help needed walking in hospital room?: A Little Help needed climbing 3-5 steps with a railing? : A Lot 6 Click Score: 17    End of Session Equipment Utilized During Treatment: Gait belt Activity Tolerance: Patient limited by fatigue Patient left: with call bell/phone within reach;in bed;in CPM Nurse Communication: Mobility status PT Visit Diagnosis: Unsteadiness on feet (R26.81);Other abnormalities of gait and mobility (R26.89);Muscle weakness (generalized) (M62.81);Difficulty in walking, not elsewhere classified (R26.2);Pain Pain - Right/Left: Left Pain - part of body: Knee     Time: 1412-1430 PT Time Calculation (min) (ACUTE ONLY): 18 min  Charges:  $Gait Training: 8-22 mins                     Lyanne CoVictoria Seairra Otani, PT  Acute Rehab Services  [phone]    Lawana ChambersVictoria L Nalla Purdy 04/29/2018, 2:35 PM

## 2018-04-29 NOTE — Progress Notes (Signed)
Physical Therapy Treatment Patient Details Name: David Dunlap MRN: [id number] DOB: April 13, 1971 Today's Date: 04/29/2018    History of Present Illness David Dunlap is a 47 y.o M s/p L TKR. PMH includes R TKR, Sleep Apnea w/ CPAP, HTN, PVD + DVT, GERD, T2DM, Arthritis, Depression, Sickle Cell Anemia, Cholecystectomy.     PT Comments    Pt progressing with mobility, ambulated 150' with RW and min guard A with antalgic pattern , 8/10 pain. Pt's discharge situation remains unclear. He is planning to speak with his mom today but that apt does not have space for him. Sister may be an option but he can't make contact with them until this evening. SNF may be best option for rehab safely until he's independent. PT will continue to follow.    Follow Up Recommendations  Follow surgeon's recommendation for DC plan and follow-up therapies;Supervision for mobility/OOB     Equipment Recommendations  Rolling walker with 5" wheels;3in1 (PT)    Recommendations for Other Services OT consult     Precautions / Restrictions Precautions Precautions: Knee;Fall Precaution Comments: Pt educated on proper positioning Restrictions Weight Bearing Restrictions: Yes LLE Weight Bearing: Weight bearing as tolerated    Mobility  Bed Mobility Overal bed mobility: Needs Assistance Bed Mobility: Supine to Sit     Supine to sit: HOB elevated;Supervision     General bed mobility comments: Pt required increased time to sit up at EOB. Pt was able to move L LE without physical assist, but reported increased pain with mobility. Pt was able to adjust hips at EOB. Pt had difficulty but required no physical assist.   Transfers Overall transfer level: Needs assistance Equipment used: Rolling walker (2 wheeled) Transfers: Sit to/from Stand Sit to Stand: Min guard         General transfer comment: min-guard for safety and increased time  Ambulation/Gait Ambulation/Gait assistance: Min guard Gait  Distance (Feet): 150 Feet Assistive device: Rolling walker (2 wheeled) Gait Pattern/deviations: Step-to pattern;Decreased stride length;Decreased weight shift to left;Decreased stance time - left;Decreased step length - right;Antalgic;Wide base of support Gait velocity: Decreased Gait velocity interpretation: <1.31 ft/sec, indicative of household ambulator General Gait Details: pt with increased pain in WB'ing but improved with distance   Stairs             Wheelchair Mobility    Modified Rankin (Stroke Patients Only)       Balance Overall balance assessment: Needs assistance Sitting-balance support: Feet supported;Single extremity supported Sitting balance-Leahy Scale: Good     Standing balance support: Bilateral upper extremity supported Standing balance-Leahy Scale: Fair Standing balance comment: reliant on RW for support at this time                            Cognition Arousal/Alertness: Awake/alert Behavior During Therapy: WFL for tasks assessed/performed Overall Cognitive Status: Within Functional Limits for tasks assessed                                        Exercises Total Joint Exercises Ankle Circles/Pumps: AROM;Both;20 reps;Supine Quad Sets: AROM;10 reps;Supine;Both Heel Slides: AROM;Left;10 reps;Supine Hip ABduction/ADduction: AROM;Left;10 reps;Supine Straight Leg Raises: AAROM;Left;5 reps;Supine Long Arc Quad: AROM;Left;10 reps;Seated Knee Flexion: AROM;Left;10 reps;Seated Goniometric ROM: 0-70    General Comments        Pertinent Vitals/Pain Pain Assessment: 0-10 Pain Score: 8  Pain Location:  L knee Pain Descriptors / Indicators: Guarding;Grimacing;Sharp Pain Intervention(s): Limited activity within patient's tolerance;Monitored during session    Home Living                      Prior Function            PT Goals (current goals can now be found in the care plan section) Acute Rehab PT  Goals Patient Stated Goal: To get back to walking without pain PT Goal Formulation: With patient Time For Goal Achievement: 05/12/18 Potential to Achieve Goals: Good Progress towards PT goals: Progressing toward goals    Frequency    7X/week      PT Plan Current plan remains appropriate    Co-evaluation              AM-PAC PT "6 Clicks" Daily Activity  Outcome Measure  Difficulty turning over in bed (including adjusting bedclothes, sheets and blankets)?: A Little Difficulty moving from lying on back to sitting on the side of the bed? : A Little Difficulty sitting down on and standing up from a chair with arms (e.g., wheelchair, bedside commode, etc,.)?: A Little Help needed moving to and from a bed to chair (including a wheelchair)?: A Little Help needed walking in hospital room?: A Little Help needed climbing 3-5 steps with a railing? : A Lot 6 Click Score: 17    End of Session Equipment Utilized During Treatment: Gait belt Activity Tolerance: Patient limited by pain Patient left: in chair;with call bell/phone within reach Nurse Communication: Mobility status PT Visit Diagnosis: Unsteadiness on feet (R26.81);Other abnormalities of gait and mobility (R26.89);Muscle weakness (generalized) (M62.81);Difficulty in walking, not elsewhere classified (R26.2);Pain Pain - Right/Left: Left Pain - part of body: Knee     Time: [id number] PT Time Calculation (min) (ACUTE ONLY): 33 min  Charges:  $Gait Training: 8-22 mins $Therapeutic Exercise: 8-22 mins                     Lyanne CoVictoria Karleen Seebeck, PT  Acute Rehab Services  [phone]    BuchananVictoria L Alexey Rhoads 04/29/2018, 9:04 AM

## 2018-04-29 NOTE — Progress Notes (Signed)
Subjective:  1 Day Post-Op Procedure(s) (LRB): LEFT TOTAL KNEE ARTHROPLASTY (Left) Patient reports pain as mild.  Feeling ok this am.   Objective: Vital signs in last 24 hours: Temp:  [97.2 F (36.2 C)-98.2 F (36.8 C)] 97.8 F (36.6 C) (08/13 0358) Pulse Rate:  [63-111] 71 (08/13 0358) Resp:  [14-21] 17 (08/13 0358) BP: (104-139)/(68-93) 104/73 (08/13 0358) SpO2:  [97 %-100 %] 97 % (08/13 0358) Weight:  [128.4 kg] 128.4 kg (08/12 0933)  Intake/Output from previous day: 08/12 0701 - 08/13 0700 In: 1729.2 [I.V.:1527.2; IV Piggyback:202] Out: 1100 [Urine:1000; Blood:100] Intake/Output this shift: No intake/output data recorded.  Recent Labs    04/29/18 0353  HGB 14.2   Recent Labs    04/29/18 0353  WBC 16.0*  RBC 4.98  HCT 40.5  PLT 162   Recent Labs    04/29/18 0353  NA 135  K 4.2  CL 102  CO2 23  BUN 15  CREATININE 1.12  GLUCOSE 123*  CALCIUM 8.3*   No results for input(s): LABPT, INR in the last 72 hours.  Neurologically intact Neurovascular intact Sensation intact distally Intact pulses distally Dorsiflexion/Plantar flexion intact Incision: dressing C/D/I No cellulitis present Compartment soft  Anticipated LOS equal to or greater than 2 midnights due to - Age 49 and older with one or more of the following:  - Obesity  - Expected need for hospital services (PT, OT, Nursing) required for safe  discharge  - Anticipated need for postoperative skilled nursing care or inpatient rehab  - Active co-morbidities: Diabetes and DVT/VTE OR   - Unanticipated findings during/Post Surgery: Slow post-op progression: GI, pain control, mobility  - Patient is a high risk of re-admission due to: Barriers to post-acute care (logistical, no family support in home)   Assessment/Plan: 1 Day Post-Op Procedure(s) (LRB): LEFT TOTAL KNEE ARTHROPLASTY (Left) Advance diet Up with therapy D/C IV fluids Discharge to SNF once insurance approves and bed available WBAT  LLE Will change bandage tomorrow    Cristie HemMary L Stanbery 04/29/2018, 8:01 AM

## 2018-04-29 NOTE — Progress Notes (Signed)
Pt to call when he is ready for CPAP.

## 2018-04-29 NOTE — Discharge Instructions (Signed)
INSTRUCTIONS AFTER JOINT REPLACEMENT  ° °o Remove items at home which could result in a fall. This includes throw rugs or furniture in walking pathways °o ICE to the affected joint every three hours while awake for 30 minutes at a time, for at least the first 3-5 days, and then as needed for pain and swelling.  Continue to use ice for pain and swelling. You may notice swelling that will progress down to the foot and ankle.  This is normal after surgery.  Elevate your leg when you are not up walking on it.   °o Continue to use the breathing machine you got in the hospital (incentive spirometer) which will help keep your temperature down.  It is common for your temperature to cycle up and down following surgery, especially at night when you are not up moving around and exerting yourself.  The breathing machine keeps your lungs expanded and your temperature down. ° ° °DIET:  As you were doing prior to hospitalization, we recommend a well-balanced diet. ° °DRESSING / WOUND CARE / SHOWERING ° °You may change your surgical dressing 7 days after surgery.  Then change the dressing every day with sterile gauze.  Please use good hand washing techniques before changing the dressing.  Do not use any lotions or creams on the incision until instructed by your surgeon.  You may shower while you have the surgical dressing which is waterproof.  After removal of surgical dressing, you must cover the incision when showering. ° °ACTIVITY ° °o Increase activity slowly as tolerated, but follow the weight bearing instructions below.   °o No driving for 6 weeks or until further direction given by your physician.  You cannot drive while taking narcotics.  °o No lifting or carrying greater than 10 lbs. until further directed by your surgeon. °o Avoid periods of inactivity such as sitting longer than an hour when not asleep. This helps prevent blood clots.  °o You may return to work once you are authorized by your doctor.  ° ° ° °WEIGHT  BEARING  ° °Weight bearing as tolerated with assist device (walker, cane, etc) as directed, use it as long as suggested by your surgeon or therapist, typically at least 4-6 weeks. ° ° °EXERCISES ° °Results after joint replacement surgery are often greatly improved when you follow the exercise, range of motion and muscle strengthening exercises prescribed by your doctor. Safety measures are also important to protect the joint from further injury. Any time any of these exercises cause you to have increased pain or swelling, decrease what you are doing until you are comfortable again and then slowly increase them. If you have problems or questions, call your caregiver or physical therapist for advice.  ° °Rehabilitation is important following a joint replacement. After just a few days of immobilization, the muscles of the leg can become weakened and shrink (atrophy).  These exercises are designed to build up the tone and strength of the thigh and leg muscles and to improve motion. Often times heat used for twenty to thirty minutes before working out will loosen up your tissues and help with improving the range of motion but do not use heat for the first two weeks following surgery (sometimes heat can increase post-operative swelling).  ° °These exercises can be done on a training (exercise) mat, on the floor, on a table or on a bed. Use whatever works the best and is most comfortable for you.    Use music or television while   you are exercising so that the exercises are a pleasant break in your day. This will make your life better with the exercises acting as a break in your routine that you can look forward to.   Perform all exercises about fifteen times, three times per day or as directed.  You should exercise both the operative leg and the other leg as well. ° °Exercises include: °  °• Quad Sets - Tighten up the muscle on the front of the thigh (Quad) and hold for 5-10 seconds.   °• Straight Leg Raises - With your  knee straight (if you were given a brace, keep it on), lift the leg to 60 degrees, hold for 3 seconds, and slowly lower the leg.  Perform this exercise against resistance later as your leg gets stronger.  °• Leg Slides: Lying on your back, slowly slide your foot toward your buttocks, bending your knee up off the floor (only go as far as is comfortable). Then slowly slide your foot back down until your leg is flat on the floor again.  °• Angel Wings: Lying on your back spread your legs to the side as far apart as you can without causing discomfort.  °• Hamstring Strength:  Lying on your back, push your heel against the floor with your leg straight by tightening up the muscles of your buttocks.  Repeat, but this time bend your knee to a comfortable angle, and push your heel against the floor.  You may put a pillow under the heel to make it more comfortable if necessary.  ° °A rehabilitation program following joint replacement surgery can speed recovery and prevent re-injury in the future due to weakened muscles. Contact your doctor or a physical therapist for more information on knee rehabilitation.  ° ° °CONSTIPATION ° °Constipation is defined medically as fewer than three stools per week and severe constipation as less than one stool per week.  Even if you have a regular bowel pattern at home, your normal regimen is likely to be disrupted due to multiple reasons following surgery.  Combination of anesthesia, postoperative narcotics, change in appetite and fluid intake all can affect your bowels.  ° °YOU MUST use at least one of the following options; they are listed in order of increasing strength to get the job done.  They are all available over the counter, and you may need to use some, POSSIBLY even all of these options:   ° °Drink plenty of fluids (prune juice may be helpful) and high fiber foods °Colace 100 mg by mouth twice a day  °Senokot for constipation as directed and as needed Dulcolax (bisacodyl), take  with full glass of water  °Miralax (polyethylene glycol) once or twice a day as needed. ° °If you have tried all these things and are unable to have a bowel movement in the first 3-4 days after surgery call either your surgeon or your primary doctor.   ° °If you experience loose stools or diarrhea, hold the medications until you stool forms back up.  If your symptoms do not get better within 1 week or if they get worse, check with your doctor.  If you experience "the worst abdominal pain ever" or develop nausea or vomiting, please contact the office immediately for further recommendations for treatment. ° ° °ITCHING:  If you experience itching with your medications, try taking only a single pain pill, or even half a pain pill at a time.  You can also use Benadryl over the counter   for itching or also to help with sleep.  ° °TED HOSE STOCKINGS:  Use stockings on both legs until for at least 2 weeks or as directed by physician office. They may be removed at night for sleeping. ° °MEDICATIONS:  See your medication summary on the “After Visit Summary” that nursing will review with you.  You may have some home medications which will be placed on hold until you complete the course of blood thinner medication.  It is important for you to complete the blood thinner medication as prescribed. ° °PRECAUTIONS:  If you experience chest pain or shortness of breath - call 911 immediately for transfer to the hospital emergency department.  ° °If you develop a fever greater that 101 F, purulent drainage from wound, increased redness or drainage from wound, foul odor from the wound/dressing, or calf pain - CONTACT YOUR SURGEON.   °                                                °FOLLOW-UP APPOINTMENTS:  If you do not already have a post-op appointment, please call the office for an appointment to be seen by your surgeon.  Guidelines for how soon to be seen are listed in your “After Visit Summary”, but are typically between 1-4 weeks  after surgery. ° °OTHER INSTRUCTIONS:  ° °Knee Replacement:  Do not place pillow under knee, focus on keeping the knee straight while resting. CPM instructions: 0-90 degrees, 2 hours in the morning, 2 hours in the afternoon, and 2 hours in the evening. Place foam block, curve side up under heel at all times except when in CPM or when walking.  DO NOT modify, tear, cut, or change the foam block in any way. ° °MAKE SURE YOU:  °• Understand these instructions.  °• Get help right away if you are not doing well or get worse.  ° ° °Thank you for letting us be a part of your medical care team.  It is a privilege we respect greatly.  We hope these instructions will help you stay on track for a fast and full recovery!  ° ° °Information on my medicine - XARELTO® (Rivaroxaban) ° °Why was Xarelto® prescribed for you? °Xarelto® was prescribed for you to reduce the risk of a blood clot forming that can cause a stroke if you have a medical condition called atrial fibrillation (a type of irregular heartbeat). ° °What do you need to know about xarelto® ? °Take your Xarelto® ONCE DAILY at the same time every day with your evening meal. °If you have difficulty swallowing the tablet whole, you may crush it and mix in applesauce just prior to taking your dose. ° °Take Xarelto® exactly as prescribed by your doctor and DO NOT stop taking Xarelto® without talking to the doctor who prescribed the medication.  Stopping without other stroke prevention medication to take the place of Xarelto® may increase your risk of developing a clot that causes a stroke.  Refill your prescription before you run out. ° °After discharge, you should have regular check-up appointments with your healthcare provider that is prescribing your Xarelto®.  In the future your dose may need to be changed if your kidney function or weight changes by a significant amount. ° °What do you do if you miss a dose? °If you are taking Xarelto® ONCE DAILY and you   miss a dose,  take it as soon as you remember on the same day then continue your regularly scheduled once daily regimen the next day. Do not take two doses of Xarelto® at the same time or on the same day.  ° °Important Safety Information °A possible side effect of Xarelto® is bleeding. You should call your healthcare provider right away if you experience any of the following: °? Bleeding from an injury or your nose that does not stop. °? Unusual colored urine (red or dark brown) or unusual colored stools (red or black). °? Unusual bruising for unknown reasons. °? A serious fall or if you hit your head (even if there is no bleeding). ° °Some medicines may interact with Xarelto® and might increase your risk of bleeding while on Xarelto®. To help avoid this, consult your healthcare provider or pharmacist prior to using any new prescription or non-prescription medications, including herbals, vitamins, non-steroidal anti-inflammatory drugs (NSAIDs) and supplements. ° °This website has more information on Xarelto®: www.xarelto.com. ° ° ° ° °

## 2018-04-29 NOTE — Evaluation (Signed)
Occupational Therapy Evaluation Patient Details Name: [name] Curt MRN: [id number] DOB: 12/16/1970 Today's Date: 04/29/2018    History of Present Illness David Dunlap is a 47 y.o M s/p L TKR. PMH includes R TKR, Sleep Apnea w/ CPAP, HTN, PVD + DVT, GERD, T2DM, Arthritis, Depression, Sickle Cell Anemia, Cholecystectomy.    Clinical Impression   PTA, pt was independent with occasional use of cane for ADL and functional mobility. Pt currently requiring mod assist overall for LB ADL, min guard assist for toilet transfers, and supervision for standing grooming tasks. Initiated education concerning knee precautions related to ADL participation. Pt would benefit from continued OT services while admitted to improve independence and safety with ADL and functional mobility prior to discharge. Pt is unsure if he will have family to assist post-acute D/C. At current functional level, feel that pt may need SNF level rehabilitation.  Plan to educate concerning use of AE for LB ADL at next session.    Follow Up Recommendations  Follow surgeon's recommendation for DC plan and follow-up therapies    Equipment Recommendations  3 in 1 bedside commode;Tub/shower bench    Recommendations for Other Services       Precautions / Restrictions Precautions Precautions: Knee;Fall Precaution Booklet Issued: Yes (comment) Precaution Comments: Pt educated concerning knee precautions related to ADL.  Restrictions Weight Bearing Restrictions: Yes LLE Weight Bearing: Weight bearing as tolerated      Mobility Bed Mobility               General bed mobility comments: OOB in recliner on my arrival.   Transfers Overall transfer level: Needs assistance Equipment used: Rolling walker (2 wheeled) Transfers: Sit to/from Stand Sit to Stand: Min guard         General transfer comment: Min guard assist for safety and increased effort.     Balance Overall balance assessment: Needs  assistance Sitting-balance support: Feet supported;Single extremity supported Sitting balance-Leahy Scale: Good     Standing balance support: Bilateral upper extremity supported;No upper extremity supported;During functional activity Standing balance-Leahy Scale: Fair Standing balance comment: Able to statically stand without UE support.                            ADL either performed or assessed with clinical judgement   ADL Overall ADL's : Needs assistance/impaired Eating/Feeding: Set up;Sitting   Grooming: Min guard;Standing   Upper Body Bathing: Set up;Sitting   Lower Body Bathing: Sit to/from stand;Moderate assistance   Upper Body Dressing : Set up;Sitting   Lower Body Dressing: Sit to/from stand;Moderate assistance   Toilet Transfer: Min guard;Ambulation;RW Toilet Transfer Details (indicate cue type and reason): standing to urinate Toileting- ArchitectClothing Manipulation and Hygiene: Min guard;Sit to/from stand       Functional mobility during ADLs: Min guard;Rolling walker General ADL Comments: Initiated education concerning compensatory LB ADL strategies. Pt may benefit from AE for LB ADL.     Vision Patient Visual Report: No change from baseline Vision Assessment?: No apparent visual deficits     Perception     Praxis      Pertinent Vitals/Pain Pain Assessment: 0-10 Pain Score: 7  Pain Location: L knee Pain Descriptors / Indicators: Guarding;Grimacing;Sharp Pain Intervention(s): Limited activity within patient's tolerance;Monitored during session;Repositioned     Hand Dominance     Extremity/Trunk Assessment Upper Extremity Assessment Upper Extremity Assessment: Overall WFL for tasks assessed   Lower Extremity Assessment Lower Extremity Assessment: LLE deficits/detail LLE Deficits /  Details: Decreased strength and ROM as expected post-operatively.    Cervical / Trunk Assessment Cervical / Trunk Assessment: Normal   Communication  Communication Communication: No difficulties   Cognition Arousal/Alertness: Awake/alert Behavior During Therapy: WFL for tasks assessed/performed Overall Cognitive Status: Within Functional Limits for tasks assessed                                     General Comments  Pt unsure of his discharge plan. Ice packs off of pt but not in freezer. Returned to freezer and placed ice bag until packs frozen and notified nurse tech.    Exercises     Shoulder Instructions      Home Living Family/patient expects to be discharged to:: Unsure Living Arrangements: Other (Comment)(unsure)                               Additional Comments: Pt has been living with friends. He will not be able to stay there on d/c. He may be able to stay with brother-in-law but unsure.       Prior Functioning/Environment Level of Independence: Independent;Independent with assistive device(s)        Comments: Occasionally using a cane PTA.         OT Problem List:        OT Treatment/Interventions:      OT Goals(Current goals can be found in the care plan section) Acute Rehab OT Goals Patient Stated Goal: To get back to walking without pain OT Goal Formulation: With patient Time For Goal Achievement: 05/13/18 Potential to Achieve Goals: Good ADL Goals Pt Will Perform Grooming: with modified independence;standing Pt Will Perform Lower Body Bathing: with modified independence;with adaptive equipment;sit to/from stand Pt Will Perform Lower Body Dressing: with modified independence;with adaptive equipment;sit to/from stand Pt Will Transfer to Toilet: with modified independence;ambulating;regular height toilet;bedside commode(BSC over toilet) Pt Will Perform Toileting - Clothing Manipulation and hygiene: with modified independence;sit to/from stand Pt Will Perform Tub/Shower Transfer: Tub transfer;with modified independence;tub bench;rolling walker  OT Frequency:     Barriers to  D/C:            Co-evaluation              AM-PAC PT "6 Clicks" Daily Activity     Outcome Measure Help from another person eating meals?: None Help from another person taking care of personal grooming?: A Little Help from another person toileting, which includes using toliet, bedpan, or urinal?: A Little Help from another person bathing (including washing, rinsing, drying)?: A Lot Help from another person to put on and taking off regular upper body clothing?: None Help from another person to put on and taking off regular lower body clothing?: A Lot 6 Click Score: 18   End of Session Equipment Utilized During Treatment: Gait belt;Rolling walker CPM Left Knee CPM Left Knee: Off Nurse Communication: Mobility status  Activity Tolerance: Patient tolerated treatment well Patient left: in chair;with call bell/phone within reach                   Time: 1101-1122 OT Time Calculation (min): 21 min Charges:  OT General Charges $OT Visit: 1 Visit OT Evaluation $OT Eval Moderate Complexity: 1 Mod  Doristine Sectionharity A Cataleyah Colborn, MS OTR/L  Pager: [phone]   Shloima Clinch A Kae Lauman 04/29/2018, 1:45 PM

## 2018-04-30 LAB — GLUCOSE, CAPILLARY
GLUCOSE-CAPILLARY: 241 mg/dL — AB (ref 70–99)
GLUCOSE-CAPILLARY: 173 mg/dL — AB (ref 70–99)
GLUCOSE-CAPILLARY: 237 mg/dL — AB (ref 70–99)
Glucose-Capillary: 254 mg/dL — ABNORMAL HIGH (ref 70–99)

## 2018-04-30 NOTE — Progress Notes (Signed)
Occupational Therapy Treatment Patient Details Name: David Dunlap MRN: [id number] DOB: [birth year] Today's Date: 04/30/2018    History of present illness David Dunlap is David 47 y.o M s/p L TKR. PMH includes R TKR, Sleep Apnea w/ CPAP, HTN, PVD + DVT, GERD, T2DM, Arthritis, Depression, Sickle Cell Anemia, Cholecystectomy.    OT comments  Pt making some progress with ADL transfers this session but was limited by pain for progression with LB dressing using AE. Educated pt concerning use of reacher and sock aid for LB dressing and long handled sponge for bathing tasks. When attempting to practice, pt unable to tolerate due to L knee pain. Continue to recommend short-term SNF level rehabilitation as pt with no family assistance available. OT will continue to follow while admitted to progress with plan of care.    Follow Up Recommendations  SNF    Equipment Recommendations  3 in 1 bedside commode;Tub/shower bench;Other (comment)(AE kit)    Recommendations for Other Services      Precautions / Restrictions Precautions Precautions: Knee;Fall Restrictions Weight Bearing Restrictions: Yes LLE Weight Bearing: Weight bearing as tolerated       Mobility Bed Mobility               General bed mobility comments: OOB in recliner on my arrival.   Transfers Overall transfer level: Needs assistance Equipment used: Rolling walker (2 wheeled) Transfers: Sit to/from Stand Sit to Stand: Min guard         General transfer comment: Guarding assist for safety    Balance Overall balance assessment: Needs assistance Sitting-balance support: Feet supported Sitting balance-Leahy Scale: Good     Standing balance support: Bilateral upper extremity supported;During functional activity Standing balance-Leahy Scale: Fair Standing balance comment: Statically standing without UE support                           ADL either performed or assessed with clinical judgement    ADL Overall ADL's : Needs assistance/impaired Eating/Feeding: Set up;Sitting   Grooming: Min guard;Standing               Lower Body Dressing: Total assistance;Sit to/from stand Lower Body Dressing Details (indicate cue type and reason): Attempted to educate pt concerning use of reacher for LB dressing tasks but pt too painful for L knee extension to utilize.  Toilet Transfer: Min guard;Ambulation;RW           Functional mobility during ADLs: Min guard;Rolling walker General ADL Comments: Increased functional limitation this session due to pain.      Vision   Vision Assessment?: No apparent visual deficits   Perception     Praxis      Cognition Arousal/Alertness: Awake/alert Behavior During Therapy: WFL for tasks assessed/performed Overall Cognitive Status: Within Functional Limits for tasks assessed                                          Exercises     Shoulder Instructions       General Comments Ice packs off of pt but not in freezer. Placed in freezer and placed loose ice pack until frozen.     Pertinent Vitals/ Pain       Pain Assessment: 0-10 Pain Score: 8  Pain Location: L knee Pain Descriptors / Indicators: Sore;Grimacing;Guarding Pain Intervention(s): Limited activity within patient's tolerance;Monitored during session;Repositioned  Home Living                                          Prior Functioning/Environment              Frequency  Min 2X/week        Progress Toward Goals  OT Goals(current goals can now be found in the care plan section)  Progress towards OT goals: Progressing toward goals  Acute Rehab OT Goals Patient Stated Goal: To get back to walking without pain OT Goal Formulation: With patient Time For Goal Achievement: 05/13/18 Potential to Achieve Goals: Good  Plan Discharge plan needs to be updated    Co-evaluation                 AM-PAC PT "6 Clicks" Daily  Activity     Outcome Measure   Help from another person eating meals?: None Help from another person taking care of personal grooming?: David Little Help from another person toileting, which includes using toliet, bedpan, or urinal?: David Little Help from another person bathing (including washing, rinsing, drying)?: David Lot Help from another person to put on and taking off regular upper body clothing?: None Help from another person to put on and taking off regular lower body clothing?: David Lot 6 Click Score: 18    End of Session Equipment Utilized During Treatment: Gait belt;Rolling walker  OT Visit Diagnosis: Other abnormalities of gait and mobility (R26.89)   Activity Tolerance Patient tolerated treatment well   Patient Left in chair;with call bell/phone within reach   Nurse Communication Mobility status        Time: 1440-1505 OT Time Calculation (min): 25 min  Charges: OT General Charges $OT Visit: 1 Visit OT Treatments $Self Care/Home Management : 23-37 mins  David Herrlich, MS OTR/L  Pager: David Dunlap 04/30/2018, 4:26 PM

## 2018-04-30 NOTE — Progress Notes (Signed)
Subjective: 2 Days Post-Op Procedure(s) (LRB): LEFT TOTAL KNEE ARTHROPLASTY (Left) Patient reports pain as mild.  Doing well this am.  Objective: Vital signs in last 24 hours: Temp:  [98.1 F (36.7 C)-98.2 F (36.8 C)] 98.1 F (36.7 C) (08/14 0519) Pulse Rate:  [79-85] 79 (08/14 0519) Resp:  [16-20] 20 (08/14 0519) BP: (109-128)/(69-85) 118/71 (08/14 0519) SpO2:  [97 %-100 %] 100 % (08/14 0519)  Intake/Output from previous day: 08/13 0701 - 08/14 0700 In: 960 [P.O.:960] Out: 4150 [Urine:4150] Intake/Output this shift: No intake/output data recorded.  Recent Labs    04/29/18 0353  HGB 14.2   Recent Labs    04/29/18 0353  WBC 16.0*  RBC 4.98  HCT 40.5  PLT 162   Recent Labs    04/29/18 0353  NA 135  K 4.2  CL 102  CO2 23  BUN 15  CREATININE 1.12  GLUCOSE 123*  CALCIUM 8.3*   No results for input(s): LABPT, INR in the last 72 hours.  Neurologically intact Neurovascular intact Sensation intact distally Intact pulses distally Dorsiflexion/Plantar flexion intact Incision: dressing C/D/I No cellulitis present Compartment soft  Anticipated LOS equal to or greater than 2 midnights due to - Age 49 and older with one or more of the following:  - Obesity  - Expected need for hospital services (PT, OT, Nursing) required for safe  discharge  - Anticipated need for postoperative skilled nursing care or inpatient rehab  - Active co-morbidities: Diabetes and DVT/VTE OR   - Unanticipated findings during/Post Surgery: Slow post-op progression: GI, pain control, mobility  - Patient is a high risk of re-admission due to: Barriers to post-acute care (logistical, no family support in home)   Assessment/Plan: 2 Days Post-Op Procedure(s) (LRB): LEFT TOTAL KNEE ARTHROPLASTY (Left) Advance diet Up with therapy Discharge to SNF once insurance approved and bed available WBAT LLE Dressing changed by me today Unsure why CSW has not seen patient.  Changed the order to  STAT    Cristie HemMary L Lulubelle Simcoe 04/30/2018, 8:19 AM

## 2018-04-30 NOTE — Progress Notes (Signed)
Placed patient on CPAP for the night with pressure set at 10cm.   

## 2018-04-30 NOTE — NC FL2 (Addendum)
[place] MEDICAID FL2 LEVEL OF CARE SCREENING TOOL     IDENTIFICATION  Patient Name: David Dunlap Birthdate: Jan 13, 1971 Sex: male Admission Date (Current Location): 04/28/2018  [facility] and [place] Number:  Producer, television/film/video and Address:  [facility], [address], [place], [place] [address]      Provider Number: [id number]  Attending Physician Name and Address:  Tarry Kos, MD  Relative Name and Phone Number:       Current Level of Care: Hospital Recommended Level of Care: Skilled Nursing Facility Prior Approval Number:    Date Approved/Denied:   PASRR Number: [id number]  Expires 06/01/18  Discharge Plan: SNF    Current Diagnoses: Patient Active Problem List   Diagnosis Date Noted  . Total knee replacement status 04/28/2018  . Primary osteoarthritis of left knee   . Unilateral primary osteoarthritis, left knee 04/10/2018  . Obstructive sleep apnea 09/05/2017  . S/P TKR (total knee replacement), right 04/17/2017  . GERD (gastroesophageal reflux disease) 11/27/2016  . Unilateral primary osteoarthritis, right knee 10/18/2016  . Neuropathy 07/25/2016  . Acute cholecystitis 06/05/2016  . Diabetes mellitus without complication (HCC) 05/18/2016  . Cholelithiasis 04/09/2016  . Major depressive disorder, single episode 04/09/2016  . Leg DVT (deep venous thromboembolism), chronic (HCC) 12/08/2014  . Family history of diabetes mellitus (DM) 12/08/2014  . Moderate major depression, single episode (HCC) 08/22/2011  . Constipation 11/22/2010  . ANKLE EDEMA 10/19/2010  . Essential hypertension 10/12/2010    Orientation RESPIRATION BLADDER Height & Weight     Self, Time, Situation, Place  Normal Continent Weight: 283 lb (128.4 kg) Height:  5\' 9"  (175.3 cm)  BEHAVIORAL SYMPTOMS/MOOD NEUROLOGICAL BOWEL NUTRITION STATUS      Continent Diet(see discharge summary)  AMBULATORY STATUS COMMUNICATION OF NEEDS Skin   Limited Assist Verbally  Surgical wounds(surgical incision on left knee, compression wrap)                       Personal Care Assistance Level of Assistance  Feeding, Bathing, Dressing Bathing Assistance: Limited assistance Feeding assistance: Independent Dressing Assistance: Limited assistance     Functional Limitations Info  Speech, Hearing, Sight Sight Info: Adequate Hearing Info: Adequate Speech Info: Adequate    SPECIAL CARE FACTORS FREQUENCY  PT (By licensed PT), OT (By licensed OT)     PT Frequency: 5x week OT Frequency: 5x week            Contractures Contractures Info: Not present    Additional Factors Info  Allergies, Code Status, Psychotropic, Insulin Sliding Scale Code Status Info: Full Code Allergies Info: No Known Allergies Psychotropic Info: FLUoxetine (PROZAC) capsule 40 mg daily PO Insulin Sliding Scale Info: insulin aspart (novoLOG) injection 0-20 Units 3x day; insulin aspart (novoLOG) injection 0-5 Units daily at bedtime       Current Medications (04/30/2018):  This is the current hospital active medication list Current Facility-Administered Medications  Medication Dose Route Frequency Provider Last Rate Last Dose  . acetaminophen (TYLENOL) tablet 325-650 mg  325-650 mg Oral Q6H PRN Tarry Kos, MD      . alum & mag hydroxide-simeth (MAALOX/MYLANTA) 200-200-20 MG/5ML suspension 30 mL  30 mL Oral Q4H PRN Tarry Kos, MD      . atorvastatin (LIPITOR) tablet 20 mg  20 mg Oral q1800 Tarry Kos, MD   20 mg at 04/29/18 1702  . celecoxib (CELEBREX) capsule 200 mg  200 mg Oral BID Roda Shutters, Naiping  M, MD   200 mg at 04/30/18 0904  . dicyclomine (BENTYL) tablet 20 mg  20 mg Oral BID Tarry KosXu, Naiping M, MD   20 mg at 04/30/18 0904  . diphenhydrAMINE (BENADRYL) 12.5 MG/5ML elixir 25 mg  25 mg Oral Q4H PRN Tarry KosXu, Naiping M, MD      . diphenoxylate-atropine (LOMOTIL) 2.5-0.025 MG per tablet 1 tablet  1 tablet Oral QID PRN Tarry KosXu, Naiping M, MD      . docusate sodium (COLACE) capsule 100 mg   100 mg Oral BID Tarry KosXu, Naiping M, MD   100 mg at 04/29/18 0940  . FLUoxetine (PROZAC) capsule 40 mg  40 mg Oral Daily Tarry KosXu, Naiping M, MD   40 mg at 04/30/18 0904  . gabapentin (NEURONTIN) capsule 300 mg  300 mg Oral TID Tarry KosXu, Naiping M, MD   300 mg at 04/30/18 0904  . glipiZIDE (GLUCOTROL) tablet 5 mg  5 mg Oral BID AC Tarry KosXu, Naiping M, MD   5 mg at 04/30/18 0904  . HYDROmorphone (DILAUDID) injection 0.5-1 mg  0.5-1 mg Intravenous Q4H PRN Tarry KosXu, Naiping M, MD      . insulin aspart (novoLOG) injection 0-20 Units  0-20 Units Subcutaneous TID WC Tarry KosXu, Naiping M, MD   4 Units at 04/30/18 1300  . insulin aspart (novoLOG) injection 0-5 Units  0-5 Units Subcutaneous QHS Tarry KosXu, Naiping M, MD   2 Units at 04/29/18 2257  . lisinopril (PRINIVIL,ZESTRIL) tablet 10 mg  10 mg Oral Daily Tarry KosXu, Naiping M, MD   10 mg at 04/30/18 0904  . loratadine (CLARITIN) tablet 10 mg  10 mg Oral Daily Tarry KosXu, Naiping M, MD   10 mg at 04/30/18 0904  . magnesium citrate solution 1 Bottle  1 Bottle Oral Once PRN Tarry KosXu, Naiping M, MD      . menthol-cetylpyridinium (CEPACOL) lozenge 3 mg  1 lozenge Oral PRN Tarry KosXu, Naiping M, MD       Or  . phenol (CHLORASEPTIC) mouth spray 1 spray  1 spray Mouth/Throat PRN Tarry KosXu, Naiping M, MD      . methocarbamol (ROBAXIN) tablet 500 mg  500 mg Oral Q6H PRN Tarry KosXu, Naiping M, MD   500 mg at 04/28/18 1541   Or  . methocarbamol (ROBAXIN) 500 mg in dextrose 5 % 50 mL IVPB  500 mg Intravenous Q6H PRN Tarry KosXu, Naiping M, MD      . metoCLOPramide (REGLAN) tablet 5-10 mg  5-10 mg Oral Q8H PRN Tarry KosXu, Naiping M, MD       Or  . metoCLOPramide (REGLAN) injection 5-10 mg  5-10 mg Intravenous Q8H PRN Tarry KosXu, Naiping M, MD      . ondansetron [facility](ZOFRAN) tablet 4 mg  4 mg Oral Q6H PRN Tarry KosXu, Naiping M, MD       Or  . ondansetron [facility](ZOFRAN) injection 4 mg  4 mg Intravenous Q6H PRN Tarry KosXu, Naiping M, MD      . oxyCODONE (Oxy IR/ROXICODONE) immediate release tablet 10-15 mg  10-15 mg Oral Q4H PRN Tarry KosXu, Naiping M, MD   10 mg at 04/28/18 1541  . oxyCODONE (Oxy  IR/ROXICODONE) immediate release tablet 5-10 mg  5-10 mg Oral Q4H PRN Tarry KosXu, Naiping M, MD   10 mg at 04/29/18 2044  . oxyCODONE (OXYCONTIN) 12 hr tablet 10 mg  10 mg Oral Q12H Tarry KosXu, Naiping M, MD   10 mg at 04/30/18 0904  . polyethylene glycol (MIRALAX / GLYCOLAX) packet 17 g  17 g Oral Daily PRN Tarry KosXu, Naiping M, MD      .  rivaroxaban (XARELTO) tablet 20 mg  20 mg Oral Q supper Tarry KosXu, Naiping M, MD   20 mg at 04/29/18 1703  . sorbitol 70 % solution 30 mL  30 mL Oral Daily PRN Tarry KosXu, Naiping M, MD         Discharge Medications: Please see discharge summary for a list of discharge medications.  Relevant Imaging Results:  Relevant Lab Results:   Additional Information SS#[id number]  [address] [place], [place]

## 2018-04-30 NOTE — Progress Notes (Signed)
Physical Therapy Treatment Patient Details Name: David Dunlap MRN: [id number] DOB: [birth year] Today's Date: 04/30/2018    History of Present Illness David Dunlap is a 47 y.o M s/p L TKR. PMH includes R TKR, Sleep Apnea w/ CPAP, HTN, PVD + DVT, GERD, T2DM, Arthritis, Depression, Sickle Cell Anemia, Cholecystectomy.     PT Comments    Pt remains limited with functional mobility secondary to pain. Pt would continue to benefit from skilled physical therapy services at this time while admitted and after d/c to address the below listed limitations in order to improve overall safety and independence with functional mobility.  L KNEE ROM: Flexion = 60 degrees Extension = lacking 20 degrees to neutral   Follow Up Recommendations  SNF;Other (comment)(for short term rehab)     Equipment Recommendations  Rolling walker with 5" wheels;3in1 (PT)    Recommendations for Other Services OT consult     Precautions / Restrictions Precautions Precautions: Knee;Fall Precaution Comments: reviewed LE positioning following knee surgery Restrictions Weight Bearing Restrictions: Yes LLE Weight Bearing: Weight bearing as tolerated    Mobility  Bed Mobility               General bed mobility comments: pt OOB in recliner chair upon arrival  Transfers Overall transfer level: Needs assistance Equipment used: Rolling walker (2 wheeled) Transfers: Sit to/from Stand Sit to Stand: Min guard         General transfer comment: min guard for safety, good technique  Ambulation/Gait Ambulation/Gait assistance: Min guard Gait Distance (Feet): 100 Feet Assistive device: Rolling walker (2 wheeled) Gait Pattern/deviations: Step-through pattern;Decreased step length - right;Decreased step length - left;Decreased stride length;Decreased weight shift to left;Antalgic Gait velocity: Decreased Gait velocity interpretation: <1.31 ft/sec, indicative of household ambulator General Gait Details:  pt with modestly antalgic gait pattern and limited secondary to pain; pt required occasional standing rest break; pt steady with RW with min guard for safety   Stairs             Wheelchair Mobility    Modified Rankin (Stroke Patients Only)       Balance Overall balance assessment: Needs assistance Sitting-balance support: Feet supported Sitting balance-Leahy Scale: Good     Standing balance support: Bilateral upper extremity supported;During functional activity Standing balance-Leahy Scale: Poor Standing balance comment: Statically standing without UE support                            Cognition Arousal/Alertness: Awake/alert Behavior During Therapy: WFL for tasks assessed/performed Overall Cognitive Status: Within Functional Limits for tasks assessed                                        Exercises Total Joint Exercises Long Arc QuadBarbaraann Boys: AAROM;Left;10 reps;Seated Knee Flexion: AAROM;Left;10 reps;Seated Goniometric ROM: Flexion = 60 degrees; Extension = lacking 20 degrees to neutral    General Comments General comments (skin integrity, edema, etc.): Ice packs off of pt but not in freezer. Placed in freezer and placed loose ice pack until frozen.       Pertinent Vitals/Pain Pain Assessment: Faces Pain Score: 8  Faces Pain Scale: Hurts whole lot Pain Location: L knee Pain Descriptors / Indicators: Sore;Grimacing;Guarding Pain Intervention(s): Monitored during session;Repositioned    Home Living  Prior Function            PT Goals (current goals can now be found in the care plan section) Acute Rehab PT Goals Patient Stated Goal: To get back to walking without pain PT Goal Formulation: With patient Time For Goal Achievement: 05/12/18 Potential to Achieve Goals: Good Progress towards PT goals: Progressing toward goals    Frequency    7X/week      PT Plan Current plan remains appropriate     Co-evaluation              AM-PAC PT "6 Clicks" Daily Activity  Outcome Measure  Difficulty turning over in bed (including adjusting bedclothes, sheets and blankets)?: A Little Difficulty moving from lying on back to sitting on the side of the bed? : A Little Difficulty sitting down on and standing up from a chair with arms (e.g., wheelchair, bedside commode, etc,.)?: Unable Help needed moving to and from a bed to chair (including a wheelchair)?: A Little Help needed walking in hospital room?: A Little Help needed climbing 3-5 steps with a railing? : A Lot 6 Click Score: 15    End of Session Equipment Utilized During Treatment: Gait belt Activity Tolerance: Patient limited by pain Patient left: in chair;with call bell/phone within reach Nurse Communication: Mobility status PT Visit Diagnosis: Other abnormalities of gait and mobility (R26.89);Pain Pain - Right/Left: Left Pain - part of body: Knee     Time: [id number] PT Time Calculation (min) (ACUTE ONLY): 22 min  Charges:  $Gait Training: 8-22 mins                     ShingletownJennifer Brita Jurgensen, [place]PT, [place]PT [phone]    David Dunlap 04/30/2018, 4:39 PM

## 2018-04-30 NOTE — Discharge Summary (Addendum)
Patient ID: David Dunlap MRN: [id number] DOB/AGE: 11-30-1970 47 y.o.  Admit date: 04/28/2018 Discharge date: 05/02/2018  Admission Diagnoses:  Active Problems:   Primary osteoarthritis of left knee   Total knee replacement status   Discharge Diagnoses:  S/p left total knee replacement.  Will need CPAP size 10.  Past Medical History:  Diagnosis Date  . Arthritis   . Depression   . Diabetes mellitus without complication (HCC) 05/2016   NEW ONSET    WITH FAMILY HISTORY  . DVT (deep venous thrombosis) (HCC)   . Essential hypertension   . Gallstones 05/2016  . GERD (gastroesophageal reflux disease)   . Sickle cell anemia (HCC)    Sickle trait  . Sleep apnea    Cpap    Surgeries: Procedure(s): LEFT TOTAL KNEE ARTHROPLASTY on 04/28/2018   Consultants:   Discharged Condition: Improved  Hospital Course: David Dunlap is an 47 y.o. male who was admitted 04/28/2018 for operative treatment of primary localized osteoarthritis left knee. Patient has severe unremitting pain that affects sleep, daily activities, and work/hobbies. After pre-op clearance the patient was taken to the operating room on 04/28/2018 and underwent  Procedure(s): LEFT TOTAL KNEE ARTHROPLASTY.    Patient was given perioperative antibiotics:  Anti-infectives (From admission, onward)   Start     Dose/Rate Route Frequency Ordered Stop   04/28/18 1600  ceFAZolin (ANCEF) IVPB 2g/100 mL premix     2 g 200 mL/hr over 30 Minutes Intravenous Every 6 hours 04/28/18 1435 04/29/18 1246   04/28/18 1130  vancomycin (VANCOCIN) powder  Status:  Discontinued       As needed 04/28/18 1131 04/28/18 1307   04/28/18 1000  ceFAZolin (ANCEF) 3 g in dextrose 5 % 50 mL IVPB     3 g 100 mL/hr over 30 Minutes Intravenous To [facility] 04/25/18 0827 04/28/18 1054   04/28/18 0000  sulfamethoxazole-trimethoprim (BACTRIM DS,SEPTRA DS) 800-160 MG tablet     1 tablet Oral 2 times daily 04/28/18 1231         Patient was  given sequential compression devices, early ambulation, and chemoprophylaxis to prevent DVT.  Patient benefited maximally from hospital stay and there were no complications.    Recent vital signs:  Patient Vitals for the past 24 hrs:  BP Temp Temp src Pulse Resp SpO2  05/02/18 0356 115/76 98.1 F (36.7 C) Oral 75 18 93 %  05/01/18 2050 121/72 98.8 F (37.1 C) Oral 88 - 96 %  05/01/18 1447 107/79 97.7 F (36.5 C) Oral 80 16 97 %     Recent laboratory studies:  No results for input(s): WBC, HGB, HCT, PLT, NA, K, CL, CO2, BUN, CREATININE, GLUCOSE, INR, CALCIUM in the last 72 hours.  Invalid input(s): PT, 2   Discharge Medications:   Allergies as of 05/02/2018   No Known Allergies     Medication List    STOP taking these medications   acetaminophen 500 MG tablet Commonly known as:  TYLENOL   cyclobenzaprine 10 MG tablet Commonly known as:  FLEXERIL   sildenafil 50 MG tablet Commonly known as:  VIAGRA   traMADol 50 MG tablet Commonly known as:  ULTRAM     TAKE these medications   atorvastatin 20 MG tablet Commonly known as:  LIPITOR Take 1 tablet (20 mg total) by mouth daily.   cetirizine 10 MG tablet Commonly known as:  ZYRTEC TAKE 1 TABLET BY MOUTH DAILY.   dicyclomine 20 MG tablet Commonly known as:  BENTYL  Take 1 tablet (20 mg total) by mouth 2 (two) times daily.   diphenoxylate-atropine 2.5-0.025 MG tablet Commonly known as:  LOMOTIL Take 1 tablet by mouth 4 (four) times daily as needed for diarrhea or loose stools.   FLUoxetine 20 MG tablet Commonly known as:  PROZAC Take 2 tablets (40 mg total) by mouth daily.   gabapentin 300 MG capsule Commonly known as:  NEURONTIN Take 1 capsule (300 mg total) by mouth 3 (three) times daily. What changed:    when to take this  reasons to take this   glipiZIDE 5 MG tablet Commonly known as:  GLUCOTROL Take 1 tablet (5 mg total) by mouth 2 (two) times daily before a meal. What changed:  when to take this    glucose blood test strip Used daily before meals   hydrochlorothiazide 25 MG tablet Commonly known as:  HYDRODIURIL Take 1 tablet (25 mg total) by mouth daily.   lisinopril 10 MG tablet Commonly known as:  PRINIVIL,ZESTRIL Take 1 tablet (10 mg total) by mouth daily.   metFORMIN 500 MG tablet Commonly known as:  GLUCOPHAGE Take 2 tablets (1,000 mg total) by mouth 2 (two) times daily with a meal.   methocarbamol 750 MG tablet Commonly known as:  ROBAXIN Take 1 tablet (750 mg total) by mouth 2 (two) times daily as needed for muscle spasms.   ondansetron 4 MG tablet Commonly known as:  ZOFRAN Take 1 tablet (4 mg total) by mouth every 6 (six) hours.   oxyCODONE 5 MG immediate release tablet Commonly known as:  Oxy IR/ROXICODONE Take 1-3 tablets (5-15 mg total) by mouth every 4 (four) hours as needed.   promethazine 25 MG tablet Commonly known as:  PHENERGAN Take 1 tablet (25 mg total) by mouth every 6 (six) hours as needed for nausea.   rivaroxaban 20 MG Tabs tablet Commonly known as:  XARELTO Take 1 tablet (20 mg total) by mouth daily with supper. What changed:  when to take this   senna-docusate 8.6-50 MG tablet Commonly known as:  Senokot-S Take 1 tablet by mouth at bedtime as needed.   sulfamethoxazole-trimethoprim 800-160 MG tablet Commonly known as:  BACTRIM DS,SEPTRA DS Take 1 tablet by mouth 2 (two) times daily.   TRUE METRIX METER Devi 1 each by Does not apply route 3 (three) times daily before meals.   TRUEPLUS LANCETS 28G Misc Use daily before meals     ASK your doctor about these medications   oxyCODONE 10 mg 12 hr tablet Commonly known as:  OXYCONTIN Take 1 tablet (10 mg total) by mouth every 12 (twelve) hours for 3 days. Ask about: Should I take this medication?            Durable Medical Equipment  (From admission, onward)         Start     Ordered   04/28/18 1436  DME Walker rolling  Once    Question:  Patient needs a walker to treat  with the following condition  Answer:  Total knee replacement status   04/28/18 1435   04/28/18 1436  DME 3 n 1  Once     04/28/18 1435   04/28/18 1436  DME Bedside commode  Once    Question:  Patient needs a bedside commode to treat with the following condition  Answer:  Total knee replacement status   04/28/18 1435          Diagnostic Studies: Dg Knee Left Port  Result Date: 04/28/2018  CLINICAL DATA:  Status post left knee arthroplasty. EXAM: PORTABLE LEFT KNEE - 1-2 VIEW COMPARISON:  Radiographs of Jan 30, 2018. FINDINGS: The femoral, tibial, and patellar prostheses appear to be well situated. No fracture or dislocation is noted. Expected postoperative changes are noted in the anterior soft tissues. IMPRESSION: Status post left total knee arthroplasty. Electronically Signed   By: Lupita Raider, M.D.   On: 04/28/2018 13:31   Xr Knee 3 View Left  Result Date: 04/10/2018 Advanced degenerative joint disease with significant joint space narrowing.  Xr Knee 3 View Right  Result Date: 04/10/2018 Stable right total knee replacement without any evidence of complication   Disposition: Discharge disposition: 03-Skilled Nursing Facility          Contact information for follow-up providers    Tarry Kos, MD In 2 weeks.   Specialty:  Orthopedic Surgery Why:  For suture removal, For wound re-check Contact information: [address] [place] [address] [phone]            Contact information for after-discharge care    Destination    [facility] .   Service:  Skilled Paramedic information: [address] [place] [address] [phone]                   Signed: Cristie Hem 05/02/2018, 7:49 AM

## 2018-04-30 NOTE — Clinical Social Work Note (Signed)
Clinical Social Work Assessment  Patient Details  Name: David Dunlap MRN: [id number] Date of Birth: 10-Mar-1971  Date of referral:  04/30/18               Reason for consult:  Facility Placement, Housing Concerns/Homelessness                Permission sought to share information with:  Facility Industrial/product designerContact Representative Permission granted to share information::  Yes, Verbal Permission Granted  Name::        Agency::  SNFs  Relationship::     Contact Information:     Housing/Transportation Living arrangements for the past 2 months:  No permanent address Source of Information:  Patient Patient Interpreter Needed:  None Criminal Activity/Legal Involvement Pertinent to Current Situation/Hospitalization:  No - Comment as needed Significant Relationships:  None Lives with:  Friends Do you feel safe going back to the place where you live?  No Need for family participation in patient care:  Yes (Comment)  Care giving concerns:  CSW received consult for discharge needs. CSW spoke with patient regarding PT recommendation of SNF placement at time of discharge. Patient reported that he is homeless. Patient states he is not staying in a shelter but with different friends. CSW spoke with the patient's mother and his sister and they both expressed that  they were unable care for the patient in their home.   Social Worker assessment / plan:  CSW spoke with patient concerning [facility] before returning home. Patient states that he is homeless  Employment status:  Unemployed Health and safety inspectornsurance information:  Self Pay (Medicaid Pending) PT Recommendations:  Skilled Nursing Facility Information / Referral to community resources:  Skilled Nursing Facility  Patient/Family's Response to care:  Patient recognizes need for rehab before returning home and [place]   Patient/Family's Understanding of and Emotional Response to Diagnosis, Current Treatment, and  Prognosis: Patient is realistic regarding therapy needs and expressed being hopeful for SNF placement. Patient expressed understanding of CSW role and discharge process as well as medical condition. No questions/concerns about plan or treatment.   Emotional Assessment Appearance:  Appears stated age Attitude/Demeanor/Rapport:  Engaged Affect (typically observed):  Accepting, Pleasant Orientation:  Oriented to Self, Oriented to Place, Oriented to  Time, Oriented to Situation Alcohol / Substance use:  Not Applicable Psych involvement (Current and /or in the community):  No (Comment)  Discharge Needs  Concerns to be addressed:  Basic Needs Readmission within the last 30 days:    Current discharge risk:  None Barriers to Discharge:  Continued Medical Work up, Inadequate or no insurance   Eduard RouxCynthia N Jayma Volpi, LCSWA 04/30/2018, 1:35 PM

## 2018-04-30 NOTE — Progress Notes (Signed)
Physical Therapy Treatment Patient Details Name: David Dunlap MRN: [id number] DOB: 1970/12/03 Today's Date: 04/30/2018    History of Present Illness David Dunlap is a 47 y.o M s/p L TKR. PMH includes R TKR, Sleep Apnea w/ CPAP, HTN, PVD + DVT, GERD, T2DM, Arthritis, Depression, Sickle Cell Anemia, Cholecystectomy.     PT Comments    Pt limited this session secondary to increased pain and fatigue. Pt reported that he is unsure of d/c disposition as he is unable to return to his friend's home and stated that he could not d/c to his sister's house.  Pt is otherwise homeless. Updated recommendations to SNF for short-term rehab. Pt would continue to benefit from skilled physical therapy services at this time while admitted and after d/c to address the below listed limitations in order to improve overall safety and independence with functional mobility.   Follow Up Recommendations  SNF;Other (comment)(for short term rehab)     Equipment Recommendations  Rolling walker with 5" wheels;3in1 (PT)    Recommendations for Other Services       Precautions / Restrictions Precautions Precautions: Knee;Fall Precaution Comments: reviewed LE positioning following knee surgery Restrictions Weight Bearing Restrictions: Yes LLE Weight Bearing: Weight bearing as tolerated    Mobility  Bed Mobility               General bed mobility comments: pt OOB in recliner chair upon arrival  Transfers Overall transfer level: Needs assistance Equipment used: Rolling walker (2 wheeled) Transfers: Sit to/from Stand Sit to Stand: Min guard         General transfer comment: min guard for safety; pt required increased time and effort, good technique  Ambulation/Gait Ambulation/Gait assistance: Min guard Gait Distance (Feet): 100 Feet Assistive device: Rolling walker (2 wheeled) Gait Pattern/deviations: Step-through pattern;Decreased step length - right;Decreased step length - left;Decreased  stride length;Decreased weight shift to left;Antalgic Gait velocity: Decreased Gait velocity interpretation: <1.31 ft/sec, indicative of household ambulator General Gait Details: pt with modestly antalgic gait pattern and limited secondary to pain; pt required occasional standing rest break; pt steady with RW with min guard for safety   Stairs             Wheelchair Mobility    Modified Rankin (Stroke Patients Only)       Balance Overall balance assessment: Needs assistance Sitting-balance support: Feet supported Sitting balance-Leahy Scale: Good     Standing balance support: Bilateral upper extremity supported;During functional activity Standing balance-Leahy Scale: Fair                              Cognition Arousal/Alertness: Awake/alert Behavior During Therapy: WFL for tasks assessed/performed Overall Cognitive Status: Within Functional Limits for tasks assessed                                        Exercises Total Joint Exercises Ankle Circles/Pumps: AROM;Both;10 reps;Seated Quad Sets: AROM;Strengthening;Left;10 reps;Seated Hip ABduction/ADduction: AAROM;Left;10 reps;Seated Long Arc Quad: AAROM;Left;10 reps;Seated    General Comments        Pertinent Vitals/Pain Pain Assessment: Faces Faces Pain Scale: Hurts even more Pain Location: L knee Pain Descriptors / Indicators: Sore;Grimacing;Guarding Pain Intervention(s): Monitored during session;Repositioned;Premedicated before session    Home Living  Prior Function            PT Goals (current goals can now be found in the care plan section) Acute Rehab PT Goals PT Goal Formulation: With patient Time For Goal Achievement: 05/12/18 Potential to Achieve Goals: Good Progress towards PT goals: Progressing toward goals    Frequency    7X/week      PT Plan Current plan remains appropriate    Co-evaluation              AM-PAC PT  "6 Clicks" Daily Activity  Outcome Measure  Difficulty turning over in bed (including adjusting bedclothes, sheets and blankets)?: A Little Difficulty moving from lying on back to sitting on the side of the bed? : A Little Difficulty sitting down on and standing up from a chair with arms (e.g., wheelchair, bedside commode, etc,.)?: Unable Help needed moving to and from a bed to chair (including a wheelchair)?: A Little Help needed walking in hospital room?: A Little Help needed climbing 3-5 steps with a railing? : A Lot 6 Click Score: 15    End of Session Equipment Utilized During Treatment: Gait belt Activity Tolerance: Patient limited by pain Patient left: in chair;with call bell/phone within reach Nurse Communication: Mobility status PT Visit Diagnosis: Other abnormalities of gait and mobility (R26.89);Pain Pain - Right/Left: Left Pain - part of body: Knee     Time: [id number] PT Time Calculation (min) (ACUTE ONLY): 24 min  Charges:  $Gait Training: 8-22 mins $Therapeutic Exercise: 8-22 mins                     [place]in[place]Jennifer Halea Lieb, [place]PT, [place]PT [phone]    Alessandra BevelsJennifer M Jimeka Balan 04/30/2018, 10:50 AM

## 2018-05-01 LAB — GLUCOSE, CAPILLARY
GLUCOSE-CAPILLARY: 186 mg/dL — AB (ref 70–99)
Glucose-Capillary: 186 mg/dL — ABNORMAL HIGH (ref 70–99)
Glucose-Capillary: 226 mg/dL — ABNORMAL HIGH (ref 70–99)
Glucose-Capillary: 232 mg/dL — ABNORMAL HIGH (ref 70–99)

## 2018-05-01 NOTE — Progress Notes (Signed)
Subjective: 3 Days Post-Op Procedure(s) (LRB): LEFT TOTAL KNEE ARTHROPLASTY (Left) Patient reports pain as mild.  Doing well this am.   Objective: Vital signs in last 24 hours: Temp:  [98.1 F (36.7 C)-98.4 F (36.9 C)] 98.1 F (36.7 C) (08/15 0419) Pulse Rate:  [68-84] 75 (08/15 0419) Resp:  [16-19] 16 (08/15 0419) BP: (112-118)/(65-72) 112/65 (08/15 0419) SpO2:  [97 %-98 %] 97 % (08/15 0419)  Intake/Output from previous day: 08/14 0701 - 08/15 0700 In: 960 [P.O.:960] Out: 1200 [Urine:1200] Intake/Output this shift: No intake/output data recorded.  Recent Labs    04/29/18 0353  HGB 14.2   Recent Labs    04/29/18 0353  WBC 16.0*  RBC 4.98  HCT 40.5  PLT 162   Recent Labs    04/29/18 0353  NA 135  K 4.2  CL 102  CO2 23  BUN 15  CREATININE 1.12  GLUCOSE 123*  CALCIUM 8.3*   No results for input(s): LABPT, INR in the last 72 hours.  Neurologically intact Neurovascular intact Sensation intact distally Intact pulses distally Dorsiflexion/Plantar flexion intact Incision: scant drainage No cellulitis present Compartment soft  Anticipated LOS equal to or greater than 2 midnights due to - Age 165 and older with one or more of the following:  - Obesity  - Expected need for hospital services (PT, OT, Nursing) required for safe  discharge  - Anticipated need for postoperative skilled nursing care or inpatient rehab  - Active co-morbidities: Diabetes and DVT/VTE OR   - Unanticipated findings during/Post Surgery: Slow post-op progression: GI, pain control, mobility  - Patient is a high risk of re-admission due to: Barriers to post-acute care (logistical, no family support in home)   Assessment/Plan: 3 Days Post-Op Procedure(s) (LRB): LEFT TOTAL KNEE ARTHROPLASTY (Left) Advance diet Up with therapy Discharge to SNF as soon as insurance approves and bed available WBAT LLE    Cristie HemMary L Rosalene Wardrop 05/01/2018, 7:47 AM

## 2018-05-01 NOTE — Clinical Social Work Note (Signed)
30 day note on front of chart to be signed by MD. Put in physician sticky note to notify.  Charlynn CourtSarah Zen Felling, CSW [phone]

## 2018-05-01 NOTE — Progress Notes (Addendum)
Patient has CPAP machine setup at bedside and stated he would place on and off when he was ready for bed. Will call if any issues arise for us to help. Sterile H20 is in machine and it is ready for use.

## 2018-05-01 NOTE — Evaluation (Signed)
Physical Therapy Evaluation Patient Details Name: David Dunlap MRN: [id number] DOB: [birth year] Today's Date: 05/01/2018   History of Present Illness  David Dunlap is a 47 y.o M s/p L TKR. PMH includes R TKR, Sleep Apnea w/ CPAP, HTN, PVD + DVT, GERD, T2DM, Arthritis, Depression, Sickle Cell Anemia, Cholecystectomy.     Clinical Impression  Pt remains very limited secondary to increased pain, weakness and fatigue. Continuing to recommend pt d/c to a SNF for further intensive therapy services. Pt would continue to benefit from skilled physical therapy services at this time while admitted and after d/c to address the below listed limitations in order to improve overall safety and independence with functional mobility.     Follow Up Recommendations SNF    Equipment Recommendations  Rolling walker with 5" wheels;3in1 (PT)    Recommendations for Other Services       Precautions / Restrictions Precautions Precautions: Knee;Fall Precaution Comments: reviewed LE positioning following knee surgery Restrictions Weight Bearing Restrictions: Yes LLE Weight Bearing: Weight bearing as tolerated      Mobility  Bed Mobility Overal bed mobility: Needs Assistance Bed Mobility: Supine to Sit     Supine to sit: Mod assist     General bed mobility comments: increased time and effort, assist with movement of L LE off of bed  Transfers Overall transfer level: Needs assistance Equipment used: Rolling walker (2 wheeled) Transfers: Sit to/from Stand Sit to Stand: Min guard         General transfer comment: min guard for safety, good technique  Ambulation/Gait Ambulation/Gait assistance: Min guard Gait Distance (Feet): 50 Feet Assistive device: Rolling walker (2 wheeled) Gait Pattern/deviations: Step-through pattern;Decreased step length - right;Decreased step length - left;Decreased stride length;Decreased weight shift to left;Antalgic Gait velocity: Decreased Gait velocity  interpretation: <1.31 ft/sec, indicative of household ambulator General Gait Details: very limited secondary to increased pain and fatigue; pt with modestly antalgic gait pattern and limited secondary to pain; pt required occasional standing rest break; pt steady with RW with min guard for safety  Stairs            Wheelchair Mobility    Modified Rankin (Stroke Patients Only)       Balance Overall balance assessment: Needs assistance Sitting-balance support: Feet supported Sitting balance-Leahy Scale: Good     Standing balance support: Bilateral upper extremity supported;During functional activity Standing balance-Leahy Scale: Poor                               Pertinent Vitals/Pain Pain Assessment: Faces Faces Pain Scale: Hurts whole lot Pain Location: L knee Pain Descriptors / Indicators: Sore;Grimacing;Guarding Pain Intervention(s): Monitored during session;Repositioned    Home Living                        Prior Function                 Hand Dominance        Extremity/Trunk Assessment                Communication      Cognition Arousal/Alertness: Awake/alert Behavior During Therapy: WFL for tasks assessed/performed Overall Cognitive Status: Within Functional Limits for tasks assessed  General Comments      Exercises Total Joint Exercises Ankle Circles/Pumps: AROM;Both;10 reps;Supine Quad Sets: AROM;Strengthening;Left;10 reps;Supine Short Arc Quad: AROM;Strengthening;Left;10 reps;Supine Hip ABduction/ADduction: AAROM;Left;10 reps;Supine Long Arc Quad: AAROM;Left;10 reps;Seated Knee Flexion: AAROM;Left;10 reps;Seated   Assessment/Plan    PT Assessment    PT Problem List         PT Treatment Interventions      PT Goals (Current goals can be found in the Care Plan section)  Acute Rehab PT Goals PT Goal Formulation: With patient Time For Goal  Achievement: 05/12/18 Potential to Achieve Goals: Good    Frequency 7X/week   Barriers to discharge        Co-evaluation               AM-PAC PT "6 Clicks" Daily Activity  Outcome Measure Difficulty turning over in bed (including adjusting bedclothes, sheets and blankets)?: Unable Difficulty moving from lying on back to sitting on the side of the bed? : Unable Difficulty sitting down on and standing up from a chair with arms (e.g., wheelchair, bedside commode, etc,.)?: Unable Help needed moving to and from a bed to chair (including a wheelchair)?: A Little Help needed walking in hospital room?: A Little Help needed climbing 3-5 steps with a railing? : A Lot 6 Click Score: 11    End of Session Equipment Utilized During Treatment: Gait belt Activity Tolerance: Patient limited by pain Patient left: in chair;with call bell/phone within reach Nurse Communication: Mobility status PT Visit Diagnosis: Other abnormalities of gait and mobility (R26.89);Pain Pain - Right/Left: Left Pain - part of body: Knee    Time: [id number] PT Time Calculation (min) (ACUTE ONLY): 27 min   Charges:     PT Treatments $Gait Training: 8-22 mins $Therapeutic Exercise: 8-22 mins        [place] PointJennifer Marily Konczal, [place], [place]PT [phone]   Alessandra BevelsJennifer M Beola Vasallo 05/01/2018, 10:58 AM

## 2018-05-02 ENCOUNTER — Telehealth (INDEPENDENT_AMBULATORY_CARE_PROVIDER_SITE_OTHER): Payer: Self-pay | Admitting: Physician Assistant

## 2018-05-02 LAB — GLUCOSE, CAPILLARY
GLUCOSE-CAPILLARY: 173 mg/dL — AB (ref 70–99)
GLUCOSE-CAPILLARY: 211 mg/dL — AB (ref 70–99)
GLUCOSE-CAPILLARY: 204 mg/dL — AB (ref 70–99)
Glucose-Capillary: 150 mg/dL — ABNORMAL HIGH (ref 70–99)

## 2018-05-02 NOTE — Clinical Social Work Placement (Signed)
   CLINICAL SOCIAL WORK PLACEMENT  NOTE  Date:  05/02/2018  Patient Details  Name: David Dunlap MRN: [id number] Date of Birth: [birth year]  Clinical Social Work is seeking post-discharge placement for this patient at the Skilled  Nursing Facility level of care (*CSW will initial, date and re-position this form in  chart as items are completed):      Patient/family provided with [facility] Clinical Social Work Department's list of facilities offering this level of care within the geographic area requested by the patient (or if unable, by the patient's family).  Yes   Patient/family informed of their freedom to choose among providers that offer the needed level of care, that participate in Medicare, Medicaid or managed care program needed by the patient, have an available bed and are willing to accept the patient.      Patient/family informed of [facility] ownership interest in [facility] and [facility], as well as of the fact that they are under no obligation to receive care at these facilities.  PASRR submitted to EDS on       PASRR number received on 05/02/18     Existing PASRR number confirmed on       FL2 transmitted to all facilities in geographic area requested by pt/family on 05/02/18     FL2 transmitted to all facilities within larger geographic area on       Patient informed that his/her managed care company has contracts with or will negotiate with certain facilities, including the following:        Yes   Patient/family informed of bed offers received.  Patient chooses bed at [facility]     Physician recommends and patient chooses bed at      Patient to be transferred to [facility]) on 05/02/18.  Patient to be transferred to facility by [facility]     Patient family notified on 05/02/18 of transfer.  Name of family member notified:  Pt is alert and oriented and responsbile for self     PHYSICIAN       Additional  Comment:    _______________________________________________ Maree KrabbeBridget A Loomis Anacker, LCSW 05/02/2018, 12:05 PM

## 2018-05-02 NOTE — Plan of Care (Signed)
  Problem: Education: Goal: Knowledge of General Education information will improve Description Including pain rating scale, medication(s)/side effects and non-pharmacologic comfort measures Outcome: Progressing Note:  POC reviewed with pt.   

## 2018-05-02 NOTE — Clinical Social Work Note (Signed)
PASRR has been received. Liason for [facility] will come to the hospital to do admission ppwk with pt. Pt will go with a LOG.   Velora MediateBridget Aronda Burford, MSW [phone]

## 2018-05-02 NOTE — Progress Notes (Signed)
Physical Therapy Treatment Patient Details Name: David Dunlap MRN: [id number] DOB: September 17, 1971 Today's Date: 05/02/2018    History of Present Illness David Dunlap is a 47 y.o M s/p L TKR. PMH includes R TKR, Sleep Apnea w/ CPAP, HTN, PVD + DVT, GERD, T2DM, Arthritis, Depression, Sickle Cell Anemia, Cholecystectomy.     PT Comments    Pt making slow progress towards mobility goals. Agreeable to ambulate in hallway with cues for improved gait quality. Continues to remain appropriate for SNF level therapies as pt lives alone and current gait speed places him at a high fall risk.     Follow Up Recommendations  SNF     Equipment Recommendations  Rolling walker with 5" wheels;3in1 (PT)    Recommendations for Other Services OT consult     Precautions / Restrictions Precautions Precautions: Knee;Fall Precaution Booklet Issued: Yes (comment) Restrictions Weight Bearing Restrictions: Yes LLE Weight Bearing: Weight bearing as tolerated    Mobility  Bed Mobility Overal bed mobility: Needs Assistance Bed Mobility: Supine to Sit     Supine to sit: Mod assist;HOB elevated     General bed mobility comments: mod A for LLE management and to elevate trunk.   Transfers Overall transfer level: Needs assistance Equipment used: Rolling walker (2 wheeled) Transfers: Sit to/from Stand Sit to Stand: Min guard         General transfer comment: close min guard for safety. No cues required.  Ambulation/Gait Ambulation/Gait assistance: Min guard Gait Distance (Feet): 80 Feet Assistive device: Rolling walker (2 wheeled) Gait Pattern/deviations: Decreased step length - right;Decreased stride length;Decreased weight shift to left;Antalgic;Step-to pattern;Decreased stance time - left Gait velocity: Decreased Gait velocity interpretation: <1.31 ft/sec, indicative of household ambulator General Gait Details: Pt with antlagic gait pattern seconday to post op pain. Cues for equal step  length and postural control. Pt unable to correct with cueing.    Stairs             Wheelchair Mobility    Modified Rankin (Stroke Patients Only)       Balance Overall balance assessment: Needs assistance Sitting-balance support: Feet supported Sitting balance-Leahy Scale: Good     Standing balance support: Bilateral upper extremity supported;During functional activity Standing balance-Leahy Scale: Poor Standing balance comment: Statically standing without UE support                            Cognition Arousal/Alertness: Awake/alert Behavior During Therapy: Flat affect Overall Cognitive Status: Within Functional Limits for tasks assessed                                        Exercises      General Comments General comments (skin integrity, edema, etc.): Pt irritated on arrival to room as lunch had arrived cold.      Pertinent Vitals/Pain Pain Assessment: Faces Faces Pain Scale: Hurts little more Pain Location: L knee Pain Descriptors / Indicators: Sore;Grimacing;Guarding Pain Intervention(s): Monitored during session;Limited activity within patient's tolerance;Repositioned    Home Living                      Prior Function            PT Goals (current goals can now be found in the care plan section) Acute Rehab PT Goals Patient Stated Goal: To get back to walking without  pain PT Goal Formulation: With patient Time For Goal Achievement: 05/12/18 Potential to Achieve Goals: Good Progress towards PT goals: Progressing toward goals    Frequency    7X/week      PT Plan Current plan remains appropriate    Co-evaluation              AM-PAC PT "6 Clicks" Daily Activity  Outcome Measure  Difficulty turning over in bed (including adjusting bedclothes, sheets and blankets)?: Unable Difficulty moving from lying on back to sitting on the side of the bed? : Unable Difficulty sitting down on and standing up  from a chair with arms (e.g., wheelchair, bedside commode, etc,.)?: Unable Help needed moving to and from a bed to chair (including a wheelchair)?: A Little Help needed walking in hospital room?: A Little Help needed climbing 3-5 steps with a railing? : A Lot 6 Click Score: 11    End of Session Equipment Utilized During Treatment: Gait belt Activity Tolerance: Patient tolerated treatment well Patient left: in chair;with call bell/phone within reach Nurse Communication: Mobility status PT Visit Diagnosis: Other abnormalities of gait and mobility (R26.89);Pain Pain - Right/Left: Left Pain - part of body: Knee     Time: [id number] PT Time Calculation (min) (ACUTE ONLY): 20 min  Charges:  $Gait Training: 8-22 mins                     Kallie LocksHannah Mandeep Ferch, [place]PTA Pager [phone] Acute Rehab  Sheral ApleyHannah E Maronda Caison 05/02/2018, 1:05 PM

## 2018-05-02 NOTE — Progress Notes (Signed)
Patient being discharged to SNF,nurse at facility [facility][Misty Gibson Rn} took report.Patient is alert and oriented X 4 ,vital signs are stable.Prescriptins give along with AVs.

## 2018-05-02 NOTE — Progress Notes (Signed)
Patient transported via [facility] to SNF at this time

## 2018-05-02 NOTE — Clinical Social Work Note (Addendum)
Clinical Social Worker facilitated patient discharge including contacting patient family and facility to confirm patient discharge plans.  Clinical information faxed to facility and family agreeable with plan.  CSW arranged ambulance transport via [facility] (4:00 PM) to [facility] (room 153 A).  RN to call [phone] for report prior to discharge. RN PLEASE SEND MASK WITH PT.  Clinical Social Worker will sign off for now as social work intervention is no longer needed. Please consult us again if new need arises.  Velora MediateBridget Curtis Cain, MSW [phone]

## 2018-05-02 NOTE — Progress Notes (Signed)
Subjective: 4 Days Post-Op Procedure(s) (LRB): LEFT TOTAL KNEE ARTHROPLASTY (Left) Patient reports pain as mild.  Doing well this am.  Objective: Vital signs in last 24 hours: Temp:  [97.7 F (36.5 C)-98.8 F (37.1 C)] 98.1 F (36.7 C) (08/16 0356) Pulse Rate:  [75-88] 75 (08/16 0356) Resp:  [16-18] 18 (08/16 0356) BP: (107-121)/(72-79) 115/76 (08/16 0356) SpO2:  [93 %-97 %] 93 % (08/16 0356)  Intake/Output from previous day: 08/15 0701 - 08/16 0700 In: 480 [P.O.:480] Out: -  Intake/Output this shift: No intake/output data recorded.  No results for input(s): HGB in the last 72 hours. No results for input(s): WBC, RBC, HCT, PLT in the last 72 hours. No results for input(s): NA, K, CL, CO2, BUN, CREATININE, GLUCOSE, CALCIUM in the last 72 hours. No results for input(s): LABPT, INR in the last 72 hours.  Neurologically intact Neurovascular intact Sensation intact distally Intact pulses distally Dorsiflexion/Plantar flexion intact Incision: moderate drainage No cellulitis present Compartment soft  Anticipated LOS equal to or greater than 2 midnights due to - Age 49 and older with one or more of the following:  - Obesity  - Expected need for hospital services (PT, OT, Nursing) required for safe  discharge  - Anticipated need for postoperative skilled nursing care or inpatient rehab  - Active co-morbidities: Diabetes and DVT/VTE OR   - Unanticipated findings during/Post Surgery: Slow post-op progression: GI, pain control, mobility  - Patient is a high risk of re-admission due to: Barriers to post-acute care (logistical, no family support in home)   Assessment/Plan: 4 Days Post-Op Procedure(s) (LRB): LEFT TOTAL KNEE ARTHROPLASTY (Left) Advance diet Up with therapy Discharge to SNF today WBAT LLE Dressing changed by me today     Cristie HemMary L Stanbery 05/02/2018, 7:47 AM

## 2018-05-09 ENCOUNTER — Ambulatory Visit (INDEPENDENT_AMBULATORY_CARE_PROVIDER_SITE_OTHER): Payer: Self-pay | Admitting: Physician Assistant

## 2018-05-09 ENCOUNTER — Encounter (INDEPENDENT_AMBULATORY_CARE_PROVIDER_SITE_OTHER): Payer: Self-pay | Admitting: Orthopaedic Surgery

## 2018-05-09 VITALS — Ht 69.0 in | Wt 283.0 lb

## 2018-05-09 DIAGNOSIS — Z96652 Presence of left artificial knee joint: Secondary | ICD-10-CM

## 2018-05-09 MED ORDER — SULFAMETHOXAZOLE-TRIMETHOPRIM 800-160 MG PO TABS: 1.0000 | ORAL_TABLET | Freq: Two times a day (BID) | ORAL | 0 refills | Status: DC

## 2018-05-09 NOTE — Progress Notes (Signed)
Post-Op Visit Note   Patient: David Dunlap           Date of Birth: [birth year]           MRN: [id number] Visit Date: 05/09/2018 PCP: Lavinia Sharps, NP   Assessment & Plan:  Chief Complaint:  Chief Complaint  Patient presents with  . Left Knee - Routine Post Op    04/28/18 left total knee    Visit Diagnoses:  1. Hx of total knee replacement, left     Plan: Patient is a pleasant 47 year old gentleman who presents to our clinic today 11 days status post left total knee replacement, date of surgery 04/28/2018.  He has been in a rehab facility.  Moderate pain.  No fevers, chills or any other systemic symptoms.  He has had mild but diffuse drainage to the distal aspect of his incision.  Examination of his left knee reveals a well-healing surgical incision proximal aspect with diffuse serosanguineous drainage distal half.  No erythema.  No increased pain with motion of the knee.  Calf is soft and nontender.  He is neurovascularly intact distally.  Today, we applied Betadine paint and Steri-Strips as well as a dry dressing.  We have written on his nursing facility papers to continue to do this daily.  We will also extend his Bactrim twice daily for another week.  He will follow-up with [place] in 1 week's time for wound check.  He will call with any concerning symptoms in the meantime.  Follow-Up Instructions: Return in about 1 week (around 05/16/2018).   Orders:  No orders of the defined types were placed in this encounter.  Meds ordered this encounter  Medications  . sulfamethoxazole-trimethoprim (BACTRIM DS,SEPTRA DS) 800-160 MG tablet    Sig: Take 1 tablet by mouth 2 (two) times daily.    Dispense:  20 tablet    Refill:  0    Imaging: No results found.  PMFS History: Patient Active Problem List   Diagnosis Date Noted  . Hx of total knee replacement, left 04/28/2018  . Primary osteoarthritis of left knee   . Unilateral primary osteoarthritis, left knee 04/10/2018  .  Obstructive sleep apnea 09/05/2017  . S/P TKR (total knee replacement), right 04/17/2017  . GERD (gastroesophageal reflux disease) 11/27/2016  . Unilateral primary osteoarthritis, right knee 10/18/2016  . Neuropathy 07/25/2016  . Acute cholecystitis 06/05/2016  . Diabetes mellitus without complication (HCC) 05/18/2016  . Cholelithiasis 04/09/2016  . Major depressive disorder, single episode 04/09/2016  . Leg DVT (deep venous thromboembolism), chronic (HCC) 12/08/2014  . Family history of diabetes mellitus (DM) 12/08/2014  . Moderate major depression, single episode (HCC) 08/22/2011  . Constipation 11/22/2010  . ANKLE EDEMA 10/19/2010  . Essential hypertension 10/12/2010   Past Medical History:  Diagnosis Date  . Arthritis   . Depression   . Diabetes mellitus without complication (HCC) 05/2016   NEW ONSET    WITH FAMILY HISTORY  . DVT (deep venous thrombosis) (HCC)   . Essential hypertension   . Gallstones 05/2016  . GERD (gastroesophageal reflux disease)   . Sickle cell anemia (HCC)    Sickle trait  . Sleep apnea    Cpap    Family History  Problem Relation Age of Onset  . Diabetes Mother   . Diabetes Father   . Hypertension Sister     Past Surgical History:  Procedure Laterality Date  . CHOLECYSTECTOMY N/A 06/07/2016   Procedure: LAPAROSCOPIC CHOLECYSTECTOMY WITH ATTEMPTED INTRAOPERATIVE CHOLANGIOGRAM;  Surgeon: Violeta GelinasBurke Thompson, MD;  [facility];  Service: General;  Laterality: N/A;  . LIGAMENT REPAIR     R forearm  . TOTAL KNEE ARTHROPLASTY Right 04/17/2017   Procedure: RIGHT TOTAL KNEE ARTHROPLASTY;  Surgeon: Tarry KosXu, Naiping M, MD;  Location: [facility] OR;  Service: Orthopedics;  Laterality: Right;  . TOTAL KNEE ARTHROPLASTY Left 04/28/2018   Procedure: LEFT TOTAL KNEE ARTHROPLASTY;  Surgeon: Tarry KosXu, Naiping M, MD;  Location: [facility] OR;  Service: Orthopedics;  Laterality: Left;   Social History   Occupational History  . Not on file  Tobacco Use  . Smoking status: Never Smoker  .  Smokeless tobacco: Never Used  Substance and Sexual Activity  . Alcohol use: Yes    Alcohol/week: 1.0 - 2.0 standard drinks    Types: 1 - 2 Cans of beer per week    Comment: doesn't drink all the drink  . Drug use: No    Types: Marijuana    Comment: last time 1989  . Sexual activity: Not on file

## 2018-05-16 ENCOUNTER — Ambulatory Visit (INDEPENDENT_AMBULATORY_CARE_PROVIDER_SITE_OTHER): Payer: Self-pay | Admitting: Orthopaedic Surgery

## 2018-05-16 DIAGNOSIS — M1712 Unilateral primary osteoarthritis, left knee: Secondary | ICD-10-CM

## 2018-05-16 DIAGNOSIS — Z96652 Presence of left artificial knee joint: Secondary | ICD-10-CM

## 2018-05-16 MED ORDER — OXYCODONE-ACETAMINOPHEN 5-325 MG PO TABS: 1.0000 | ORAL_TABLET | ORAL | 0 refills | Status: DC | PRN

## 2018-05-16 NOTE — Progress Notes (Signed)
Post-Op Visit Note   Patient: David Dunlap           Date of Birth: 1971/06/10           MRN: [id number] Visit Date: 05/16/2018 PCP: Lavinia Sharps, NP   Assessment & Plan:  Chief Complaint: No chief complaint on file.  Visit Diagnoses:  1. Hx of total knee replacement, left   2. Unilateral primary osteoarthritis, left knee     Plan: Marks is 18 days status post left total knee replacement.  He comes in for recheck.  His drainage has completely stopped and the incision has completely healed.  No signs of infection.  At this point I want him to apply mupirocin ointment twice a day to the incision.  Percocets were refilled today.  He is being discharged from SNF today.  I have given him a referral to outpatient physical therapy.  Recheck in 4 weeks with three-view x-rays of the left knee.  Follow-Up Instructions: Return in about 4 weeks (around 06/13/2018).   Orders:  No orders of the defined types were placed in this encounter.  Meds ordered this encounter  Medications  . oxyCODONE-acetaminophen (PERCOCET) 5-325 MG tablet    Sig: Take 1-2 tablets by mouth every 4 (four) hours as needed for severe pain.    Dispense:  30 tablet    Refill:  0    Imaging: No results found.  PMFS History: Patient Active Problem List   Diagnosis Date Noted  . Hx of total knee replacement, left 04/28/2018  . Primary osteoarthritis of left knee   . Unilateral primary osteoarthritis, left knee 04/10/2018  . Obstructive sleep apnea 09/05/2017  . S/P TKR (total knee replacement), right 04/17/2017  . GERD (gastroesophageal reflux disease) 11/27/2016  . Unilateral primary osteoarthritis, right knee 10/18/2016  . Neuropathy 07/25/2016  . Acute cholecystitis 06/05/2016  . Diabetes mellitus without complication (HCC) 05/18/2016  . Cholelithiasis 04/09/2016  . Major depressive disorder, single episode 04/09/2016  . Leg DVT (deep venous thromboembolism), chronic (HCC) 12/08/2014  . Family  history of diabetes mellitus (DM) 12/08/2014  . Moderate major depression, single episode (HCC) 08/22/2011  . Constipation 11/22/2010  . ANKLE EDEMA 10/19/2010  . Essential hypertension 10/12/2010   Past Medical History:  Diagnosis Date  . Arthritis   . Depression   . Diabetes mellitus without complication (HCC) 05/2016   NEW ONSET    WITH FAMILY HISTORY  . DVT (deep venous thrombosis) (HCC)   . Essential hypertension   . Gallstones 05/2016  . GERD (gastroesophageal reflux disease)   . Sickle cell anemia (HCC)    Sickle trait  . Sleep apnea    Cpap    Family History  Problem Relation Age of Onset  . Diabetes Mother   . Diabetes Father   . Hypertension Sister     Past Surgical History:  Procedure Laterality Date  . CHOLECYSTECTOMY N/A 06/07/2016   Procedure: LAPAROSCOPIC CHOLECYSTECTOMY WITH ATTEMPTED INTRAOPERATIVE CHOLANGIOGRAM;  Surgeon: Violeta Gelinas, MD;  Location: [facility] OR;  Service: General;  Laterality: N/A;  . LIGAMENT REPAIR     R forearm  . TOTAL KNEE ARTHROPLASTY Right 04/17/2017   Procedure: RIGHT TOTAL KNEE ARTHROPLASTY;  Surgeon: Tarry Kos, MD;  Location: [facility] OR;  Service: Orthopedics;  Laterality: Right;  . TOTAL KNEE ARTHROPLASTY Left 04/28/2018   Procedure: LEFT TOTAL KNEE ARTHROPLASTY;  Surgeon: Tarry Kos, MD;  Location: [facility] OR;  Service: Orthopedics;  Laterality: Left;   Social History  Occupational History  . Not on file  Tobacco Use  . Smoking status: Never Smoker  . Smokeless tobacco: Never Used  Substance and Sexual Activity  . Alcohol use: Yes    Alcohol/week: 1.0 - 2.0 standard drinks    Types: 1 - 2 Cans of beer per week    Comment: doesn't drink all the drink  . Drug use: No    Types: Marijuana    Comment: last time 1989  . Sexual activity: Not on file

## 2018-05-28 ENCOUNTER — Ambulatory Visit: Payer: Self-pay | Attending: Orthopaedic Surgery | Admitting: Physical Therapy

## 2018-05-28 ENCOUNTER — Encounter: Payer: Self-pay | Admitting: Physical Therapy

## 2018-05-28 DIAGNOSIS — R600 Localized edema: Secondary | ICD-10-CM | POA: Insufficient documentation

## 2018-05-28 DIAGNOSIS — M25562 Pain in left knee: Secondary | ICD-10-CM | POA: Insufficient documentation

## 2018-05-28 DIAGNOSIS — M6281 Muscle weakness (generalized): Secondary | ICD-10-CM | POA: Insufficient documentation

## 2018-05-28 DIAGNOSIS — R262 Difficulty in walking, not elsewhere classified: Secondary | ICD-10-CM | POA: Insufficient documentation

## 2018-05-28 NOTE — Therapy (Signed)
[facility] [address] [place], [place], [address] Phone: [phone]   Fax:  [phone]  Physical Therapy Evaluation  Patient Details  Name: David Dunlap MRN: [id number] Date of Birth: [birth year] Referring Provider: Gershon Mussel MD   Encounter Date: 05/28/2018  PT End of Session - 05/28/18 1222    Visit Number  1    Number of Visits  4    Date for PT Re-Evaluation  06/25/18    Authorization Type  self pay so pt may not be able to afford PT    PT Start Time  1145    PT Stop Time  1220    PT Time Calculation (min)  35 min    Activity Tolerance  Patient tolerated treatment well    Behavior During Therapy  [facility] for tasks assessed/performed       Past Medical History:  Diagnosis Date  . Arthritis   . Depression   . Diabetes mellitus without complication (HCC) 05/2016   NEW ONSET    WITH FAMILY HISTORY  . DVT (deep venous thrombosis) (HCC)   . Essential hypertension   . Gallstones 05/2016  . GERD (gastroesophageal reflux disease)   . Sickle cell anemia (HCC)    Sickle trait  . Sleep apnea    Cpap    Past Surgical History:  Procedure Laterality Date  . CHOLECYSTECTOMY N/A 06/07/2016   Procedure: LAPAROSCOPIC CHOLECYSTECTOMY WITH ATTEMPTED INTRAOPERATIVE CHOLANGIOGRAM;  Surgeon: Violeta Gelinas, MD;  Location: [facility] OR;  Service: General;  Laterality: N/A;  . LIGAMENT REPAIR     R forearm  . TOTAL KNEE ARTHROPLASTY Right 04/17/2017   Procedure: RIGHT TOTAL KNEE ARTHROPLASTY;  Surgeon: Tarry Kos, MD;  Location: [facility] OR;  Service: Orthopedics;  Laterality: Right;  . TOTAL KNEE ARTHROPLASTY Left 04/28/2018   Procedure: LEFT TOTAL KNEE ARTHROPLASTY;  Surgeon: Tarry Kos, MD;  Location: [facility] OR;  Service: Orthopedics;  Laterality: Left;    There were no vitals filed for this visit.   Subjective Assessment - 05/28/18 1157    Subjective  Pt relays pain and popping in his knee after TKA on 8/1/219    Limitations   Sitting;Lifting;Standing;Walking    How long can you sit comfortably?  10 min    How long can you stand comfortably?  5-10 min    Currently in Pain?  Yes    Pain Score  8     Pain Location  Knee    Pain Descriptors / Indicators  Dull;Aching;Tightness    Pain Type  Surgical pain    Pain Onset  1 to 4 weeks ago    Aggravating Factors   moving his knee and prolonged sit or stand    Pain Relieving Factors  nothing         [facility] PT Assessment - 05/28/18 0001      Assessment   Medical Diagnosis  Lt knee TKA    Referring Provider  Gershon Mussel MD    Onset Date/Surgical Date  04/28/18    Next MD Visit  06/13/18    Prior Therapy  SNF      Precautions   Precautions  None      Restrictions   Weight Bearing Restrictions  No      Balance Screen   Has the patient fallen in the past 6 months  No      Home Environment   Living Environment  Private residence    Additional Comments  15 stairs  but HR on both sides      Prior Function   Level of Independence  Independent with basic ADLs    Vocation  Unemployed    Leisure  pt reports nothing      Cognition   Overall Cognitive Status  Within Functional Limits for tasks assessed      Observation/Other Assessments   Focus on Therapeutic Outcomes (FOTO)   not done as pt is self pay and most likely one visit only      Observation/Other Assessments-Edema    Edema  --   minimal Lt knee edema     Sensation   Light Touch  Appears Intact      ROM / Strength   AROM / PROM / Strength  AROM;Strength      AROM   AROM Assessment Site  Knee    Right/Left Knee  Left    Left Knee Extension  -1    Left Knee Flexion  100      Strength   Overall Strength Comments  4/5 MMT Lt hip/knee grossly.      Flexibility   Soft Tissue Assessment /Muscle Length  --   tight quads     Palpation   Patella mobility  hypomobile, tight skin     Palpation comment  TTP ant knee      Ambulation/Gait   Gait Comments  quad cane with slow velocity and  decreased step length on Lt, he relays he progressed off RW one week ago                Objective measurements completed on examination: See above findings.      [facility] Adult PT Treatment/Exercise - 05/28/18 0001      Exercises   Exercises  Knee/Hip      Knee/Hip Exercises: Seated   Long Arc Quad  Left;10 reps      Knee/Hip Exercises: Supine   Quad Sets  Left;10 reps    Heel Slides  AAROM;15 reps   5 sec hold with strap   Straight Leg Raises  Left;10 reps      Modalities   Modalities  Cryotherapy      Cryotherapy   Number Minutes Cryotherapy  10 Minutes    Cryotherapy Location  Knee    Type of Cryotherapy  Ice pack             PT Education - 05/28/18 1221    Education Details  HEP, RICE, POC    Person(s) Educated  Patient    Methods  Explanation;Demonstration;Verbal cues;Handout    Comprehension  Verbalized understanding;Need further instruction;Returned demonstration          PT Long Term Goals - 05/28/18 1228      PT LONG TERM GOAL #1   Title  Pt will be I with more advanced HEP and concepts of RICE to control swelling.     Time  4    Period  Weeks    Status  New      PT LONG TERM GOAL #2   Title  Pt will increase strength in bilateral LE's to 4+/5 throughout knees and hips for improved functional mobility.     Time  4    Period  Weeks    Status  New      PT LONG TERM GOAL #3   Title  Pt will be able to go up and down 15 stairs with 1 rail reciprocally and safety, min pain.  Time  4    Period  Weeks    Status  New      PT LONG TERM GOAL #4   Title  Pt with be able to stand and walking in the community for up to 45 min with only min pain in Rt. knee     Time  4    Period  Weeks    Status  New             Plan - 05/28/18 1223    Clinical Impression Statement  Pt has Lt knee pain and siffness S/P TKA on 04/28/18. He has decreased ROM, decreasesed strength, and decreased weight bearing and ambulation tolerance. He is now  using quad cane for ambulaiton but did not use AD prior. He will benefit from skilled PT to address these deficits however he is self pay and does not know if he can afford PT therefore he wants to trial HEP first. He was informed his POC will be left open for 4 weeks in the event he wishes to retrun to PT and PT was highly recommended to him.    History and Personal Factors relevant to plan of care:  Rt knee TKA, self pay with financial restrictions    Clinical Presentation  Stable    Clinical Decision Making  Low    Rehab Potential  Good    PT Frequency  1x / week    PT Duration  4 weeks    PT Treatment/Interventions  Cryotherapy;Electrical Stimulation;Ultrasound;Therapeutic exercise;Passive range of motion;Vasopneumatic Device;Joint Manipulations    PT Next Visit Plan  review HEP, ROM and strenth, and gait    Consulted and Agree with Plan of Care  Patient       Patient will benefit from skilled therapeutic intervention in order to improve the following deficits and impairments:  Decreased activity tolerance, Decreased endurance, Decreased range of motion, Decreased strength, Hypomobility, Pain, Difficulty walking, Impaired flexibility  Visit Diagnosis: Localized edema  Acute pain of left knee  Muscle weakness (generalized)  Difficulty in walking, not elsewhere classified     Problem List Patient Active Problem List   Diagnosis Date Noted  . Hx of total knee replacement, left 04/28/2018  . Primary osteoarthritis of left knee   . Unilateral primary osteoarthritis, left knee 04/10/2018  . Obstructive sleep apnea 09/05/2017  . S/P TKR (total knee replacement), right 04/17/2017  . GERD (gastroesophageal reflux disease) 11/27/2016  . Unilateral primary osteoarthritis, right knee 10/18/2016  . Neuropathy 07/25/2016  . Acute cholecystitis 06/05/2016  . Diabetes mellitus without complication (HCC) 05/18/2016  . Cholelithiasis 04/09/2016  . Major depressive disorder, single episode  04/09/2016  . Leg DVT (deep venous thromboembolism), chronic (HCC) 12/08/2014  . Family history of diabetes mellitus (DM) 12/08/2014  . Moderate major depression, single episode (HCC) 08/22/2011  . Constipation 11/22/2010  . ANKLE EDEMA 10/19/2010  . Essential hypertension 10/12/2010    April Manson, PT, DPT 05/28/2018, 12:30 PM  [facility] [address] [place], [place], [address] Phone: [phone]   Fax:  [phone]  Name: David Dunlap MRN: [id number] Date of Birth: 1970/09/21

## 2018-06-02 ENCOUNTER — Emergency Department (HOSPITAL_COMMUNITY): Payer: Self-pay

## 2018-06-02 ENCOUNTER — Encounter (HOSPITAL_COMMUNITY): Payer: Self-pay | Admitting: Emergency Medicine

## 2018-06-02 ENCOUNTER — Other Ambulatory Visit: Payer: Self-pay

## 2018-06-02 ENCOUNTER — Emergency Department (HOSPITAL_COMMUNITY)
Admission: EM | Admit: 2018-06-02 | Discharge: 2018-06-02 | Disposition: A | Discharge status: Home / Self Care | Payer: Self-pay | Attending: Emergency Medicine | Admitting: Emergency Medicine

## 2018-06-02 DIAGNOSIS — M25562 Pain in left knee: Secondary | ICD-10-CM | POA: Insufficient documentation

## 2018-06-02 DIAGNOSIS — E119 Type 2 diabetes mellitus without complications: Secondary | ICD-10-CM | POA: Insufficient documentation

## 2018-06-02 DIAGNOSIS — Z96652 Presence of left artificial knee joint: Secondary | ICD-10-CM | POA: Insufficient documentation

## 2018-06-02 DIAGNOSIS — I10 Essential (primary) hypertension: Secondary | ICD-10-CM | POA: Insufficient documentation

## 2018-06-02 DIAGNOSIS — Z7984 Long term (current) use of oral hypoglycemic drugs: Secondary | ICD-10-CM | POA: Insufficient documentation

## 2018-06-02 DIAGNOSIS — Z79899 Other long term (current) drug therapy: Secondary | ICD-10-CM | POA: Insufficient documentation

## 2018-06-02 MED ORDER — OXYCODONE-ACETAMINOPHEN 5-325 MG PO TABS
1.0000 | ORAL_TABLET | Freq: Once | ORAL | Status: AC
  Administered 2018-06-02: 1 via ORAL
  Filled 2018-06-02: qty 1

## 2018-06-02 MED ORDER — MELOXICAM 7.5 MG PO TABS: 7.5000 mg | ORAL_TABLET | Freq: Every day | ORAL | 0 refills | Status: AC

## 2018-06-02 NOTE — Discharge Instructions (Addendum)
Your x-ray shows swelling without fluid in the knee. We are unable to pain medications in the emergency room.  You were given 1 dose of your regular pain medication (Percocet) today.  Also have given a short course of meloxicam to take for pain and.  Take this medication with food.  Call your surgeon's office tomorrow to discuss follow-up and further pain management.

## 2018-06-02 NOTE — ED Triage Notes (Addendum)
Pt reports he had a L knee replacement on 8/12. Pt reports since the surgery he has had pain. Pt reports taking gabapentin and oxycodone for ain with minimal relief. No swelling, drainage to surgical site. Pt ambulatory

## 2018-06-02 NOTE — ED Provider Notes (Signed)
[facility]   CSN: [id number] Arrival date & time: 06/02/18  1735     History   Chief Complaint Chief Complaint  Patient presents with  . Knee Pain    HPI David Dunlap is a 47 y.o. male.  47 year old male presents with complaint of pain in his left knee.  Patient had a left knee replacement 1 month ago, has run out of his Percocet and has not scheduled to see his orthopedist until the end of this month.  Patient reports feeling like his knee is locking up or giving out on him.  Patient has been unable to participate in physical therapy due to lack of insurance.  States the swelling in his knee and leg are chronic and unchanged for him.  He is on Xarelto for previous DVT.  He denies redness of the area, fevers, injuries or falls recently.  No other complaints or concerns.     Past Medical History:  Diagnosis Date  . Arthritis   . Depression   . Diabetes mellitus without complication (HCC) 05/2016   NEW ONSET    WITH FAMILY HISTORY  . DVT (deep venous thrombosis) (HCC)   . Essential hypertension   . Gallstones 05/2016  . GERD (gastroesophageal reflux disease)   . Sickle cell anemia (HCC)    Sickle trait  . Sleep apnea    Cpap    Patient Active Problem List   Diagnosis Date Noted  . Hx of total knee replacement, left 04/28/2018  . Primary osteoarthritis of left knee   . Unilateral primary osteoarthritis, left knee 04/10/2018  . Obstructive sleep apnea 09/05/2017  . S/P TKR (total knee replacement), right 04/17/2017  . GERD (gastroesophageal reflux disease) 11/27/2016  . Unilateral primary osteoarthritis, right knee 10/18/2016  . Neuropathy 07/25/2016  . Acute cholecystitis 06/05/2016  . Diabetes mellitus without complication (HCC) 05/18/2016  . Cholelithiasis 04/09/2016  . Major depressive disorder, single episode 04/09/2016  . Leg DVT (deep venous thromboembolism), chronic (HCC) 12/08/2014  . Family  history of diabetes mellitus (DM) 12/08/2014  . Moderate major depression, single episode (HCC) 08/22/2011  . Constipation 11/22/2010  . ANKLE EDEMA 10/19/2010  . Essential hypertension 10/12/2010    Past Surgical History:  Procedure Laterality Date  . CHOLECYSTECTOMY N/A 06/07/2016   Procedure: LAPAROSCOPIC CHOLECYSTECTOMY WITH ATTEMPTED INTRAOPERATIVE CHOLANGIOGRAM;  Surgeon: Violeta GelinasBurke Thompson, MD;  Location: [facility] OR;  Service: General;  Laterality: N/A;  . LIGAMENT REPAIR     R forearm  . TOTAL KNEE ARTHROPLASTY Right 04/17/2017   Procedure: RIGHT TOTAL KNEE ARTHROPLASTY;  Surgeon: Tarry KosXu, Naiping M, MD;  Location: [facility] OR;  Service: Orthopedics;  Laterality: Right;  . TOTAL KNEE ARTHROPLASTY Left 04/28/2018   Procedure: LEFT TOTAL KNEE ARTHROPLASTY;  Surgeon: Tarry KosXu, Naiping M, MD;  Location: [facility] OR;  Service: Orthopedics;  Laterality: Left;        Home Medications    Prior to Admission medications   Medication Sig Start Date End Date Taking? Authorizing Provider  atorvastatin (LIPITOR) 20 MG tablet Take 1 tablet (20 mg total) by mouth daily. 12/04/17   Hoy RegisterNewlin, Enobong, MD  Blood Glucose Monitoring Suppl (TRUE METRIX METER) DEVI 1 each by Does not apply route 3 (three) times daily before meals. 07/25/16   Hoy RegisterNewlin, Enobong, MD  cetirizine (ZYRTEC) 10 MG tablet TAKE 1 TABLET BY MOUTH DAILY. 02/11/18   Hoy RegisterNewlin, Enobong, MD  dicyclomine (BENTYL) 20 MG tablet Take 1 tablet (20 mg total) by mouth 2 (two)  times daily. 11/04/17   Kirichenko, Lemont Fillers, PA-C  diphenoxylate-atropine (LOMOTIL) 2.5-0.025 MG tablet Take 1 tablet by mouth 4 (four) times daily as needed for diarrhea or loose stools. Patient not taking: Reported on 05/28/2018 11/13/17   Gilda Crease, MD  FLUoxetine (PROZAC) 20 MG tablet Take 2 tablets (40 mg total) by mouth daily. 12/04/17   Hoy Register, MD  gabapentin (NEURONTIN) 300 MG capsule Take 1 capsule (300 mg total) by mouth 3 (three) times daily. Patient taking differently: Take 300  mg by mouth 3 (three) times daily as needed (pain).  12/04/17   Hoy Register, MD  glipiZIDE (GLUCOTROL) 5 MG tablet Take 1 tablet (5 mg total) by mouth 2 (two) times daily before a meal. Patient taking differently: Take 5 mg by mouth daily.  12/04/17   Hoy Register, MD  glucose blood (TRUE METRIX BLOOD GLUCOSE TEST) test strip Used daily before meals 07/25/16   Hoy Register, MD  hydrochlorothiazide (HYDRODIURIL) 25 MG tablet Take 1 tablet (25 mg total) by mouth daily. 12/04/17   Hoy Register, MD  lisinopril (PRINIVIL,ZESTRIL) 10 MG tablet Take 1 tablet (10 mg total) by mouth daily. 12/04/17   Hoy Register, MD  meloxicam (MOBIC) 7.5 MG tablet Take 1 tablet (7.5 mg total) by mouth daily for 5 days. 06/02/18 06/07/18  Jeannie Fend, PA-C  metFORMIN (GLUCOPHAGE) 500 MG tablet Take 2 tablets (1,000 mg total) by mouth 2 (two) times daily with a meal. 12/04/17   Hoy Register, MD  methocarbamol (ROBAXIN) 750 MG tablet Take 1 tablet (750 mg total) by mouth 2 (two) times daily as needed for muscle spasms. 04/28/18   Tarry Kos, MD  ondansetron (ZOFRAN) 4 MG tablet Take 1 tablet (4 mg total) by mouth every 6 (six) hours. 11/04/17   Kirichenko, Lemont Fillers, PA-C  oxyCODONE (OXY IR/ROXICODONE) 5 MG immediate release tablet Take 1-3 tablets (5-15 mg total) by mouth every 4 (four) hours as needed. 04/28/18   Tarry Kos, MD  oxyCODONE-acetaminophen (PERCOCET) 5-325 MG tablet Take 1-2 tablets by mouth every 4 (four) hours as needed for severe pain. 05/16/18   Tarry Kos, MD  promethazine (PHENERGAN) 25 MG tablet Take 1 tablet (25 mg total) by mouth every 6 (six) hours as needed for nausea. Patient not taking: Reported on 05/28/2018 04/28/18   Tarry Kos, MD  rivaroxaban (XARELTO) 20 MG TABS tablet Take 1 tablet (20 mg total) by mouth daily with supper. Patient taking differently: Take 20 mg by mouth daily.  09/04/17   Hoy Register, MD  senna-docusate (SENOKOT S) 8.6-50 MG tablet Take 1 tablet by  mouth at bedtime as needed. 04/28/18   Tarry Kos, MD  sulfamethoxazole-trimethoprim (BACTRIM DS,SEPTRA DS) 800-160 MG tablet Take 1 tablet by mouth 2 (two) times daily. 04/28/18   Tarry Kos, MD  sulfamethoxazole-trimethoprim (BACTRIM DS,SEPTRA DS) 800-160 MG tablet Take 1 tablet by mouth 2 (two) times daily. 05/09/18   Cristie Hem, PA-C  TRUEPLUS LANCETS 28G MISC Use daily before meals 07/25/16   Hoy Register, MD    Family History Family History  Problem Relation Age of Onset  . Diabetes Mother   . Diabetes Father   . Hypertension Sister     Social History Social History   Tobacco Use  . Smoking status: Never Smoker  . Smokeless tobacco: Never Used  Substance Use Topics  . Alcohol use: Yes    Alcohol/week: 1.0 - 2.0 standard drinks    Types: 1 - 2 Cans  of beer per week    Comment: doesn't drink all the drink  . Drug use: No    Types: Marijuana    Comment: last time 1989     Allergies   Patient has no known allergies.   Review of Systems Review of Systems  Constitutional: Negative for fever.  Musculoskeletal: Positive for arthralgias and myalgias.  Skin: Negative for color change, rash and wound.  Allergic/Immunologic: Positive for immunocompromised state.  Neurological: Negative for weakness and numbness.  Hematological: Bruises/bleeds easily.  All other systems reviewed and are negative.    Physical Exam Updated Vital Signs BP (!) 159/83 (BP Location: Right Arm)   Pulse 100   Temp 99 F (37.2 C) (Oral)   Resp 16   Ht 5\' 9"  (1.753 m)   Wt 127 kg   SpO2 96%   BMI 41.35 kg/m   Physical Exam  Constitutional: He is oriented to person, place, and time. He appears well-developed and well-nourished. No distress.  HENT:  Head: Normocephalic and atraumatic.  Cardiovascular: Intact distal pulses.  Pulmonary/Chest: Effort normal.  Musculoskeletal: He exhibits tenderness. He exhibits no deformity.       Left knee: He exhibits swelling. He exhibits no  erythema. Tenderness found. Medial joint line and lateral joint line tenderness noted.       Left lower leg: He exhibits swelling. He exhibits no tenderness and no bony tenderness.       Legs: Neurological: He is alert and oriented to person, place, and time.  Skin: Skin is warm and dry. He is not diaphoretic.  Psychiatric: He has a normal mood and affect. His behavior is normal.  Nursing note and vitals reviewed.    ED Treatments / Results  Labs (all labs ordered are listed, but only abnormal results are displayed) Labs Reviewed - No data to display  EKG None  Radiology Dg Knee Complete 4 Views Left  Result Date: 06/02/2018 CLINICAL DATA:  Left knee pain after knee arthroplasty August, 2019. Pain has worsened. No recent injury. EXAM: LEFT KNEE - COMPLETE 4+ VIEW COMPARISON:  04/28/2018 FINDINGS: There is mild diffuse soft tissue induration about the intact left total knee arthroplasty with patellar resurfacing and button. No hardware failure or fracture. No bone destruction nor joint effusion. IMPRESSION: Generalized mild soft tissue swelling of the left knee. Intact left total knee arthroplasty hardware without fracture nor evidence of hardware failure/loosening. Electronically Signed   By: Tollie Eth M.D.   On: 06/02/2018 20:23    Procedures Procedures (including critical care time)  Medications Ordered in ED Medications  oxyCODONE-acetaminophen (PERCOCET/ROXICET) 5-325 MG per tablet 1 tablet (has no administration in time range)     Initial Impression / Assessment and Plan / ED Course  I have reviewed the triage vital signs and the nursing notes.  Pertinent labs & imaging results that were available during my care of the patient were reviewed by me and considered in my medical decision making (see chart for details).  Clinical Course as of Jun 02 2040  Mon Jun 02, 2018  716 47 year old male presents with request for refill of his pain medication.  Patient reports left  knee replacement on August 12, has been unable to dissipate with physical therapy due to lack of insurance.  Denies falls or injuries, states buckling while he was trying to walk.  On exam patient has swelling of the knee and lower leg, states that this is chronic and not new, same findings and his most recent PT  note.  Patient's calf is nontender, there is no pitting edema, no effusion.  he is able to flex the knee to 90 degrees, there is no erythema.  X-ray shows soft tissue swelling without effusion.  Patient was given 1 dose of his Percocet while in the emergency room and discharged with a very short course of meloxicam.  I have reviewed his most recent BMP from August 13 which shows normal renal function.  Also review of PMP aware shows patient last filled 120 oxycodone 5 mg at the beginning of September for a 10-day supply.  Patient was advised to contact his orthopedic tomorrow to discuss further pain management.   [LM]    Clinical Course User Index [LM] Jeannie Fend, PA-C     Final Clinical Impressions(s) / ED Diagnoses   Final diagnoses:  Acute pain of left knee    ED Discharge Orders         Ordered    meloxicam (MOBIC) 7.5 MG tablet  Daily     06/02/18 2037           Jeannie Fend, PA-C 06/02/18 2041    Margarita Grizzle, MD 06/05/18 1054

## 2018-06-02 NOTE — ED Notes (Signed)
Patient transported to X-ray 

## 2018-06-03 ENCOUNTER — Encounter (INDEPENDENT_AMBULATORY_CARE_PROVIDER_SITE_OTHER): Payer: Self-pay | Admitting: Orthopaedic Surgery

## 2018-06-03 ENCOUNTER — Ambulatory Visit (INDEPENDENT_AMBULATORY_CARE_PROVIDER_SITE_OTHER): Payer: Self-pay | Admitting: Physician Assistant

## 2018-06-03 DIAGNOSIS — Z6841 Body Mass Index (BMI) 40.0 and over, adult: Secondary | ICD-10-CM

## 2018-06-03 DIAGNOSIS — Z96652 Presence of left artificial knee joint: Secondary | ICD-10-CM

## 2018-06-03 MED ORDER — OXYCODONE-ACETAMINOPHEN 5-325 MG PO TABS: 1.0000 | ORAL_TABLET | Freq: Two times a day (BID) | ORAL | 0 refills | Status: DC | PRN

## 2018-06-03 NOTE — Progress Notes (Signed)
Post-Op Visit Note   Patient: David Dunlap           Date of Birth: [birth year]           MRN: [id number] Visit Date: 06/03/2018 PCP: Lavinia SharpsPlacey, Mary Ann, NP   Assessment & Plan:  Chief Complaint:  Chief Complaint  Patient presents with  . Left Knee - Routine Post Op   Visit Diagnoses:  1. S/P total knee replacement, left   2. Morbid obesity (HCC)   3. Body mass index 40.0-44.9, adult [facility](HCC)     Plan: [name] is a 47 year old gentleman who presents to our clinic today 36 days status post left total knee replacement, date of surgery 04/28/2018.  He was doing okay until last week when he had increased pain.  No known injury or increased activity.  Pain he has occurs when he is on his feet as well as when he fully extends his knee.  He has been taking oxycodone which moderately helps the pain.  No fevers, chills or any other systemic symptoms.  He was seen in the ED yesterday where x-rays were obtained.  These were negative for acute findings.  These did show a well-seated prosthesis without evidence of subsidence or ostial lysis examination of his left knee reveals a well-healed surgical incision with no evidence of cellulitis or infection.  He is stable valgus and varus stress.  Range of motion 0 to 110 degrees.  No calf pain or tenderness.  At this point, I am not concerned for loosening of the prosthesis or infection.  He will ice and elevate for pain and swelling.  He will follow-up with us in 7 weeks time for recheck.  Call with concerns or questions in the meantime.  The patient meets the AMA guidelines for Morbid (severe) obesity with a BMI > 40.0 and I have recommended weight loss.   Follow-Up Instructions: Return in about 7 weeks (around 07/22/2018).   Orders:  No orders of the defined types were placed in this encounter.  Meds ordered this encounter  Medications  . oxyCODONE-acetaminophen (PERCOCET) 5-325 MG tablet    Sig: Take 1 tablet by mouth 2 (two) times daily as needed  for severe pain.    Dispense:  20 tablet    Refill:  0    Imaging: Dg Knee Complete 4 Views Left  Result Date: 06/02/2018 CLINICAL DATA:  Left knee pain after knee arthroplasty August, 2019. Pain has worsened. No recent injury. EXAM: LEFT KNEE - COMPLETE 4+ VIEW COMPARISON:  04/28/2018 FINDINGS: There is mild diffuse soft tissue induration about the intact left total knee arthroplasty with patellar resurfacing and button. No hardware failure or fracture. No bone destruction nor joint effusion. IMPRESSION: Generalized mild soft tissue swelling of the left knee. Intact left total knee arthroplasty hardware without fracture nor evidence of hardware failure/loosening. Electronically Signed   By: Tollie Ethavid  Kwon M.D.   On: 06/02/2018 20:23    PMFS History: Patient Active Problem List   Diagnosis Date Noted  . S/P total knee replacement, left 04/28/2018  . Primary osteoarthritis of left knee   . Unilateral primary osteoarthritis, left knee 04/10/2018  . Obstructive sleep apnea 09/05/2017  . S/P TKR (total knee replacement), right 04/17/2017  . GERD (gastroesophageal reflux disease) 11/27/2016  . Unilateral primary osteoarthritis, right knee 10/18/2016  . Neuropathy 07/25/2016  . Acute cholecystitis 06/05/2016  . Diabetes mellitus without complication (HCC) 05/18/2016  . Cholelithiasis 04/09/2016  . Major depressive disorder, single episode 04/09/2016  .  Leg DVT (deep venous thromboembolism), chronic (HCC) 12/08/2014  . Family history of diabetes mellitus (DM) 12/08/2014  . Moderate major depression, single episode (HCC) 08/22/2011  . Constipation 11/22/2010  . ANKLE EDEMA 10/19/2010  . Essential hypertension 10/12/2010   Past Medical History:  Diagnosis Date  . Arthritis   . Depression   . Diabetes mellitus without complication (HCC) 05/2016   NEW ONSET    WITH FAMILY HISTORY  . DVT (deep venous thrombosis) (HCC)   . Essential hypertension   . Gallstones 05/2016  . GERD  (gastroesophageal reflux disease)   . Sickle cell anemia (HCC)    Sickle trait  . Sleep apnea    Cpap    Family History  Problem Relation Age of Onset  . Diabetes Mother   . Diabetes Father   . Hypertension Sister     Past Surgical History:  Procedure Laterality Date  . CHOLECYSTECTOMY N/A 06/07/2016   Procedure: LAPAROSCOPIC CHOLECYSTECTOMY WITH ATTEMPTED INTRAOPERATIVE CHOLANGIOGRAM;  Surgeon: Violeta Gelinas, MD;  Location: [facility] OR;  Service: General;  Laterality: N/A;  . LIGAMENT REPAIR     R forearm  . TOTAL KNEE ARTHROPLASTY Right 04/17/2017   Procedure: RIGHT TOTAL KNEE ARTHROPLASTY;  Surgeon: Tarry Kos, MD;  Location: [facility] OR;  Service: Orthopedics;  Laterality: Right;  . TOTAL KNEE ARTHROPLASTY Left 04/28/2018   Procedure: LEFT TOTAL KNEE ARTHROPLASTY;  Surgeon: Tarry Kos, MD;  Location: [facility] OR;  Service: Orthopedics;  Laterality: Left;   Social History   Occupational History  . Not on file  Tobacco Use  . Smoking status: Never Smoker  . Smokeless tobacco: Never Used  Substance and Sexual Activity  . Alcohol use: Yes    Alcohol/week: 1.0 - 2.0 standard drinks    Types: 1 - 2 Cans of beer per week    Comment: doesn't drink all the drink  . Drug use: No    Types: Marijuana    Comment: last time 1989  . Sexual activity: Not on file

## 2018-06-13 ENCOUNTER — Ambulatory Visit (INDEPENDENT_AMBULATORY_CARE_PROVIDER_SITE_OTHER): Payer: Self-pay | Admitting: Orthopaedic Surgery

## 2018-06-17 NOTE — Progress Notes (Signed)
Patient ID: David Dunlap, male   DOB: 1970-09-22, 47 y.o.   MRN: [id number]     David Dunlap, is a 47 y.o. male  WUJ:[id number]  [id number]  DOB - 1970/10/30  Subjective:  Chief Complaint and HPI: David Dunlap is a 47 y.o. male here today After being seen in the ED 9/16 then by ortho on 9/17 s/p knee replacement.  Out of Oxycodone but still needs something for pain.    Blood sugars usu running 119 fasting and highest post prandial about 200.   Otherwise doing well and needs med RF.     From ortho note: 36 days status post left total knee replacement, date of surgery 04/28/2018.  He was doing okay until last week when he had increased pain.  No known injury or increased activity.  Pain he has occurs when he is on his feet as well as when he fully extends his knee.  He has been taking oxycodone which moderately helps the pain.  No fevers, chills or any other systemic symptoms.  He was seen in the ED yesterday where x-rays were obtained.  These were negative for acute findings.  These did show a well-seated prosthesis without evidence of subsidence or ostial lysis examination of his left knee reveals a well-healed surgical incision with no evidence of cellulitis or infection.  He is stable valgus and varus stress.  Range of motion 0 to 110 degrees.  No calf pain or tenderness.  At this point, I am not concerned for loosening of the prosthesis or infection.  He will ice and elevate for pain and swelling.  He will follow-up with [place] in 7 weeks time for recheck.  Call with concerns or questions in the meantime.  ROS:   Constitutional:  No f/c, No night sweats, No unexplained weight loss. EENT:  No vision changes, No blurry vision, No hearing changes. No mouth, throat, or ear problems.  Respiratory: No cough, No SOB Cardiac: No CP, no palpitations GI:  No abd pain, No N/V/D. GU: No Urinary s/sx Musculoskeletal: +L knee pain Neuro: No headache, no dizziness, no motor weakness.    Skin: No rash Endocrine:  No polydipsia. No polyuria.  Psych: Denies SI/HI  No problems updated.  ALLERGIES: No Known Allergies  PAST MEDICAL HISTORY: Past Medical History:  Diagnosis Date  . Arthritis   . Depression   . Diabetes mellitus without complication (HCC) 05/2016   NEW ONSET    WITH FAMILY HISTORY  . DVT (deep venous thrombosis) (HCC)   . Essential hypertension   . Gallstones 05/2016  . GERD (gastroesophageal reflux disease)   . Sickle cell anemia (HCC)    Sickle trait  . Sleep apnea    Cpap    MEDICATIONS AT HOME: Prior to Admission medications   Medication Sig Start Date End Date Taking? Authorizing Provider  atorvastatin (LIPITOR) 20 MG tablet Take 1 tablet (20 mg total) by mouth daily. 06/18/18  Yes Mikail Goostree M, PA-C  Blood Glucose Monitoring Suppl (TRUE METRIX METER) DEVI 1 each by Does not apply route 3 (three) times daily before meals. 07/25/16  Yes Newlin, Odette Horns, MD  cetirizine (ZYRTEC) 10 MG tablet TAKE 1 TABLET BY MOUTH DAILY. 02/11/18  Yes Hoy Register, MD  dicyclomine (BENTYL) 20 MG tablet Take 1 tablet (20 mg total) by mouth 2 (two) times daily. 11/04/17  Yes Kirichenko, Tatyana, PA-C  diphenoxylate-atropine (LOMOTIL) 2.5-0.025 MG tablet Take 1 tablet by mouth 4 (four) times daily as needed for diarrhea or  loose stools. 11/13/17  Yes Pollina, Canary Brim, MD  FLUoxetine (PROZAC) 20 MG tablet Take 2 tablets (40 mg total) by mouth daily. 06/18/18  Yes Anders Simmonds, PA-C  gabapentin (NEURONTIN) 300 MG capsule Take 1 capsule (300 mg total) by mouth 3 (three) times daily. 06/18/18  Yes Mahaila Tischer M, PA-C  glipiZIDE (GLUCOTROL) 5 MG tablet Take 1 tablet (5 mg total) by mouth 2 (two) times daily before a meal. 06/18/18  Yes Honesty Menta M, PA-C  glucose blood (TRUE METRIX BLOOD GLUCOSE TEST) test strip Used daily before meals 07/25/16  Yes Newlin, Enobong, MD  hydrochlorothiazide (HYDRODIURIL) 25 MG tablet Take 1 tablet (25 mg total) by mouth  daily. 06/18/18  Yes Naithen Rivenburg M, PA-C  lisinopril (PRINIVIL,ZESTRIL) 10 MG tablet Take 1 tablet (10 mg total) by mouth daily. 06/18/18  Yes Georgian Co M, PA-C  metFORMIN (GLUCOPHAGE) 500 MG tablet Take 2 tablets (1,000 mg total) by mouth 2 (two) times daily with a meal. 06/18/18  Yes Dixon Luczak M, PA-C  methocarbamol (ROBAXIN) 750 MG tablet Take 1 tablet (750 mg total) by mouth 2 (two) times daily as needed for muscle spasms. 04/28/18  Yes Tarry Kos, MD  ondansetron (ZOFRAN) 4 MG tablet Take 1 tablet (4 mg total) by mouth every 6 (six) hours. 11/04/17  Yes Kirichenko, Tatyana, PA-C  rivaroxaban (XARELTO) 20 MG TABS tablet Take 1 tablet (20 mg total) by mouth daily. 06/18/18  Yes Bisma Klett M, PA-C  senna-docusate (SENOKOT S) 8.6-50 MG tablet Take 1 tablet by mouth at bedtime as needed. 04/28/18  Yes Tarry Kos, MD  promethazine (PHENERGAN) 25 MG tablet Take 1 tablet (25 mg total) by mouth every 6 (six) hours as needed for nausea. Patient not taking: Reported on 06/18/2018 04/28/18   Tarry Kos, MD  traMADol (ULTRAM) 50 MG tablet Take 1 tablet (50 mg total) by mouth every 8 (eight) hours as needed. 06/18/18   Anders Simmonds, PA-C  TRUEPLUS LANCETS 28G MISC Use daily before meals Patient not taking: Reported on 06/18/2018 07/25/16   Hoy Register, MD     Objective:  EXAM:   Vitals:   06/18/18 1126  BP: (!) 138/91  Pulse: 79  Resp: 18  Temp: 98.7 F (37.1 C)  TempSrc: Oral  SpO2: 94%  Weight: 279 lb (126.6 kg)  Height: 5\' 9"  (1.753 m)    General appearance : A&OX3. NAD. Non-toxic-appearing HEENT: Atraumatic and Normocephalic.  PERRLA. EOM intact.   Neck: supple, no JVD. No cervical lymphadenopathy. No thyromegaly Chest/Lungs:  Breathing-non-labored, Good air entry bilaterally, breath sounds normal without rales, rhonchi, or wheezing  CVS: S1 S2 regular, no murmurs, gallops, rubs  Incision site healing well L knee Extremities: Bilateral Lower Ext shows no  edema, both legs are warm to touch with = pulse throughout Neurology:  CN II-XII grossly intact, Non focal.   Psych:  TP linear. J/I WNL. Normal speech. Appropriate eye contact and affect.  Skin:  No Rash  Data Review Lab Results  Component Value Date   HGBA1C 7.6 (H) 04/18/2018   HGBA1C 7.7 12/04/2017   HGBA1C 7.4 09/04/2017     Assessment & Plan   1. Type 2 diabetes mellitus with other specified complication, without long-term current use of insulin (HCC) Not at goal. Work on diet.  Continue current regimen - Glucose (CBG) - atorvastatin (LIPITOR) 20 MG tablet; Take 1 tablet (20 mg total) by mouth daily.  Dispense: 30 tablet; Refill: 5 - glipiZIDE (GLUCOTROL)  5 MG tablet; Take 1 tablet (5 mg total) by mouth 2 (two) times daily before a meal.  Dispense: 60 tablet; Refill: 5 - metFORMIN (GLUCOPHAGE) 500 MG tablet; Take 2 tablets (1,000 mg total) by mouth 2 (two) times daily with a meal.  Dispense: 120 tablet; Refill: 5  2. Severe single current episode of major depressive disorder, without psychotic features (HCC) Controlled-continue current regimen - FLUoxetine (PROZAC) 20 MG tablet; Take 2 tablets (40 mg total) by mouth daily.  Dispense: 60 tablet; Refill: 5  3. Neuropathy stable - gabapentin (NEURONTIN) 300 MG capsule; Take 1 capsule (300 mg total) by mouth 3 (three) times daily.  Dispense: 90 capsule; Refill: 3  4. Essential hypertension Not at goal but didn't take meds today.  - hydrochlorothiazide (HYDRODIURIL) 25 MG tablet; Take 1 tablet (25 mg total) by mouth daily.  Dispense: 30 tablet; Refill: 5 - lisinopril (PRINIVIL,ZESTRIL) 10 MG tablet; Take 1 tablet (10 mg total) by mouth daily.  Dispense: 30 tablet; Refill: 5  5. 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  Dispense: 30 tablet; Refill: 5  6. Status post left  knee replacement - traMADol (ULTRAM) 50 MG tablet; Take 1 tablet (50 mg total) by mouth every 8 (eight) hours as needed.  Dispense: 30 tablet; Refill: 0  Patient have been counseled extensively about nutrition and exercise  Return for Dr Gaynelle Cage and htn. 2 months  The patient was given clear instructions to go to ER or return to medical center if symptoms don't improve, worsen or new problems develop. The patient verbalized understanding. The patient was told to call to get lab results if they haven't heard anything in the next week.     Georgian Co, PA-C [facility] [place], [place] [phone]   06/18/2018, 11:59 AM

## 2018-06-18 ENCOUNTER — Ambulatory Visit: Payer: Self-pay | Attending: Family Medicine | Admitting: Physician Assistant

## 2018-06-18 ENCOUNTER — Other Ambulatory Visit: Payer: Self-pay

## 2018-06-18 VITALS — BP 138/91 | HR 79 | Temp 98.7°F | Resp 18 | Ht 69.0 in | Wt 279.0 lb

## 2018-06-18 DIAGNOSIS — K219 Gastro-esophageal reflux disease without esophagitis: Secondary | ICD-10-CM | POA: Insufficient documentation

## 2018-06-18 DIAGNOSIS — Z760 Encounter for issue of repeat prescription: Secondary | ICD-10-CM | POA: Insufficient documentation

## 2018-06-18 DIAGNOSIS — Z7901 Long term (current) use of anticoagulants: Secondary | ICD-10-CM | POA: Insufficient documentation

## 2018-06-18 DIAGNOSIS — G4730 Sleep apnea, unspecified: Secondary | ICD-10-CM | POA: Insufficient documentation

## 2018-06-18 DIAGNOSIS — F322 Major depressive disorder, single episode, severe without psychotic features: Secondary | ICD-10-CM | POA: Insufficient documentation

## 2018-06-18 DIAGNOSIS — Z96652 Presence of left artificial knee joint: Secondary | ICD-10-CM | POA: Insufficient documentation

## 2018-06-18 DIAGNOSIS — Z23 Encounter for immunization: Secondary | ICD-10-CM

## 2018-06-18 DIAGNOSIS — G629 Polyneuropathy, unspecified: Secondary | ICD-10-CM

## 2018-06-18 DIAGNOSIS — I10 Essential (primary) hypertension: Secondary | ICD-10-CM | POA: Insufficient documentation

## 2018-06-18 DIAGNOSIS — E1169 Type 2 diabetes mellitus with other specified complication: Secondary | ICD-10-CM | POA: Insufficient documentation

## 2018-06-18 DIAGNOSIS — Z7984 Long term (current) use of oral hypoglycemic drugs: Secondary | ICD-10-CM | POA: Insufficient documentation

## 2018-06-18 DIAGNOSIS — I82509 Chronic embolism and thrombosis of unspecified deep veins of unspecified lower extremity: Secondary | ICD-10-CM | POA: Insufficient documentation

## 2018-06-18 DIAGNOSIS — E1140 Type 2 diabetes mellitus with diabetic neuropathy, unspecified: Secondary | ICD-10-CM | POA: Insufficient documentation

## 2018-06-18 LAB — GLUCOSE, POCT (MANUAL RESULT ENTRY): POC Glucose: 167 mg/dl — AB (ref 70–99)

## 2018-06-18 MED ORDER — METFORMIN HCL 500 MG PO TABS: 1000.0000 mg | ORAL_TABLET | Freq: Two times a day (BID) | ORAL | 5 refills | Status: DC

## 2018-06-18 MED ORDER — GABAPENTIN 300 MG PO CAPS: 300.0000 mg | ORAL_CAPSULE | Freq: Three times a day (TID) | ORAL | 3 refills | Status: DC

## 2018-06-18 MED ORDER — GLIPIZIDE 5 MG PO TABS: 5.0000 mg | ORAL_TABLET | Freq: Two times a day (BID) | ORAL | 5 refills | Status: DC

## 2018-06-18 MED ORDER — ATORVASTATIN CALCIUM 20 MG PO TABS: 20.0000 mg | ORAL_TABLET | Freq: Every day | ORAL | 5 refills | Status: DC

## 2018-06-18 MED ORDER — FLUOXETINE HCL 20 MG PO TABS: 40.0000 mg | ORAL_TABLET | Freq: Every day | ORAL | 5 refills | Status: DC

## 2018-06-18 MED ORDER — HYDROCHLOROTHIAZIDE 25 MG PO TABS: 25.0000 mg | ORAL_TABLET | Freq: Every day | ORAL | 5 refills | Status: DC

## 2018-06-18 MED ORDER — RIVAROXABAN 20 MG PO TABS: 20.0000 mg | ORAL_TABLET | Freq: Every day | ORAL | 5 refills | Status: DC

## 2018-06-18 MED ORDER — LISINOPRIL 10 MG PO TABS: 10.0000 mg | ORAL_TABLET | Freq: Every day | ORAL | 5 refills | Status: DC

## 2018-06-18 MED ORDER — TRAMADOL HCL 50 MG PO TABS: 50.0000 mg | ORAL_TABLET | Freq: Three times a day (TID) | ORAL | 0 refills | Status: DC | PRN

## 2018-06-18 MED FILL — ?ATORVASTATIN 20 MG TABLET: 20 | 30 days supply | Qty: 30 | Fill #0

## 2018-06-18 MED FILL — $Viagra 50mg tablet: 50 | 30 days supply | Qty: 10 | Fill #3

## 2018-06-18 MED FILL — ?GLIPIZIDE 5MG TABLET: 5 | 30 days supply | Qty: 60 | Fill #0

## 2018-06-18 MED FILL — GABAPENTIN 300 MG CAPSULE: 300 | 30 days supply | Qty: 90 | Fill #0

## 2018-06-18 MED FILL — LISINOPRIL 10 MG TABS: 10 | 30 days supply | Qty: 30 | Fill #0

## 2018-06-18 MED FILL — XARELTO 20 MG TABLET: 20 | 30 days supply | Qty: 30 | Fill #0

## 2018-06-18 MED FILL — ?FLUOXETINE HCL 20MG TABLET: 20 | 30 days supply | Qty: 60 | Fill #0

## 2018-06-18 MED FILL — HYDROCHLOROTHIAZIDE 25 MG T: 25 | 30 days supply | Qty: 30 | Fill #0

## 2018-06-18 MED FILL — traMADol HCL 50 MG TABS: 50 | 10 days supply | Qty: 30 | Fill #0

## 2018-06-18 MED FILL — metFORMIN HCL 500 MG TABS: 500 | 30 days supply | Qty: 120 | Fill #0

## 2018-06-18 NOTE — Progress Notes (Signed)
Patient requested for a refill on all medications listed that he is currently taking. Patient complained of left knee pain post replacement,9.

## 2018-06-25 ENCOUNTER — Telehealth: Payer: Self-pay

## 2018-06-25 ENCOUNTER — Telehealth: Payer: Self-pay | Admitting: *Deleted

## 2018-06-25 NOTE — Telephone Encounter (Signed)
Pt called to ask Dr Alvis Lemmings to write him a letter for work stating he is not  able to work.  He said  He has been approved for disability but does not have any paperwork yet and he needs a letter to fill the ap until he gets his official  Papers.  Will inform Dr Baxter Flattery RMA about p's expressedt need and this call.

## 2018-06-25 NOTE — Telephone Encounter (Signed)
Patient said he needs a note that says he cannot work so he can get food stamps.

## 2018-06-26 ENCOUNTER — Other Ambulatory Visit: Payer: Self-pay | Admitting: Family Medicine

## 2018-06-26 ENCOUNTER — Telehealth: Payer: Self-pay

## 2018-06-26 NOTE — Telephone Encounter (Signed)
Patient was called and informed of letter being ready for pick up. 

## 2018-06-26 NOTE — Telephone Encounter (Signed)
Letter is ready for pick-up.

## 2018-06-26 NOTE — Telephone Encounter (Signed)
Called pt and verified his ID x2.  Informed him that his letter from Dr Alvis Lemmings about not being able to work for food stamps was ready.  Told pt we are open for a half of a day today.  The phone suddenly disconnected.  Called back made and received voice mail and left msg.

## 2018-06-27 ENCOUNTER — Other Ambulatory Visit: Payer: Self-pay

## 2018-06-27 NOTE — Telephone Encounter (Signed)
MD in aware of need for letter.

## 2018-06-27 NOTE — Telephone Encounter (Signed)
Pt came to office and received his letter.

## 2018-07-14 ENCOUNTER — Emergency Department (HOSPITAL_COMMUNITY)
Admission: EM | Admit: 2018-07-14 | Discharge: 2018-07-15 | Disposition: A | Discharge status: Home / Self Care | Payer: Self-pay | Attending: Emergency Medicine | Admitting: Emergency Medicine

## 2018-07-14 ENCOUNTER — Other Ambulatory Visit: Payer: Self-pay

## 2018-07-14 ENCOUNTER — Encounter (HOSPITAL_COMMUNITY): Payer: Self-pay | Admitting: *Deleted

## 2018-07-14 DIAGNOSIS — G8929 Other chronic pain: Secondary | ICD-10-CM | POA: Insufficient documentation

## 2018-07-14 DIAGNOSIS — Z96653 Presence of artificial knee joint, bilateral: Secondary | ICD-10-CM | POA: Insufficient documentation

## 2018-07-14 DIAGNOSIS — Z7984 Long term (current) use of oral hypoglycemic drugs: Secondary | ICD-10-CM | POA: Insufficient documentation

## 2018-07-14 DIAGNOSIS — Z7901 Long term (current) use of anticoagulants: Secondary | ICD-10-CM | POA: Insufficient documentation

## 2018-07-14 DIAGNOSIS — Z79899 Other long term (current) drug therapy: Secondary | ICD-10-CM | POA: Insufficient documentation

## 2018-07-14 DIAGNOSIS — M25562 Pain in left knee: Secondary | ICD-10-CM | POA: Insufficient documentation

## 2018-07-14 DIAGNOSIS — M25561 Pain in right knee: Secondary | ICD-10-CM | POA: Insufficient documentation

## 2018-07-14 DIAGNOSIS — E119 Type 2 diabetes mellitus without complications: Secondary | ICD-10-CM | POA: Insufficient documentation

## 2018-07-14 DIAGNOSIS — I10 Essential (primary) hypertension: Secondary | ICD-10-CM | POA: Insufficient documentation

## 2018-07-14 DIAGNOSIS — Z86718 Personal history of other venous thrombosis and embolism: Secondary | ICD-10-CM | POA: Insufficient documentation

## 2018-07-14 LAB — I-STAT CHEM 8, ED
BUN: 7 mg/dL (ref 6–20)
CALCIUM ION: 1.12 mmol/L — AB (ref 1.15–1.40)
CHLORIDE: 99 mmol/L (ref 98–111)
CREATININE: 1 mg/dL (ref 0.61–1.24)
GLUCOSE: 131 mg/dL — AB (ref 70–99)
HCT: 43 % (ref 39.0–52.0)
Hemoglobin: 14.6 g/dL (ref 13.0–17.0)
Potassium: 3.8 mmol/L (ref 3.5–5.1)
SODIUM: 137 mmol/L (ref 135–145)
TCO2: 28 mmol/L (ref 22–32)

## 2018-07-14 MED ORDER — OXYCODONE-ACETAMINOPHEN 5-325 MG PO TABS
1.0000 | ORAL_TABLET | Freq: Once | ORAL | Status: AC
  Administered 2018-07-14: 1 via ORAL
  Filled 2018-07-14: qty 1

## 2018-07-14 NOTE — ED Triage Notes (Addendum)
Pt reports hx of bilateral knee pain. Has already been diagnosed with blood clots and is taking xarelto. This diagnosis was over 1 year ago.  Is here today for pain control but family reports possible redness to right leg, none noted at triage.  No acute distress is noted at this time.

## 2018-07-14 NOTE — ED Notes (Signed)
EDP at bedside  

## 2018-07-14 NOTE — ED Provider Notes (Signed)
[facility] EMERGENCY DEPARTMENT Provider Note   CSN: [id number] Arrival date & time: 07/14/18  1644     History   Chief Complaint Chief Complaint  Patient presents with  . Knee Pain    HPI David Dunlap is a 47 y.o. male.  The history is provided by the patient and medical records.    47 year old male with history of arthritis, depression, diabetes, history of DVT on Xarelto, GERD, sleep apnea, sickle cell trait, presenting to the ED with knee pain.  Patient is status post bilateral knee replacements, left was most recent in 04/28/18 with Dr. Roda Shutters.  States this went well without any noted complications.  States he still has a lot of ongoing pain in the left knee and is not sure why.  He denies any new injury, trauma, or falls.  He does report some pain his right calf as well over the past week.  States worse when walking.  Feels like he might have some swelling as well.  Patient report she mostly came in today because he needs pain control.  He was originally on oxycodone post-op but has recently been on tramadol from his PCP but has run out of that as well.  He has not been taking any other medications for pain.  Still using cane when ambulating.    Past Medical History:  Diagnosis Date  . Arthritis   . Depression   . Diabetes mellitus without complication (HCC) 05/2016   NEW ONSET    WITH FAMILY HISTORY  . DVT (deep venous thrombosis) (HCC)   . Essential hypertension   . Gallstones 05/2016  . GERD (gastroesophageal reflux disease)   . Sickle cell anemia (HCC)    Sickle trait  . Sleep apnea    Cpap    Patient Active Problem List   Diagnosis Date Noted  . S/P total knee replacement, left 04/28/2018  . Primary osteoarthritis of left knee   . Unilateral primary osteoarthritis, left knee 04/10/2018  . Obstructive sleep apnea 09/05/2017  . S/P TKR (total knee replacement), right 04/17/2017  . GERD (gastroesophageal reflux disease) 11/27/2016  .  Unilateral primary osteoarthritis, right knee 10/18/2016  . Neuropathy 07/25/2016  . Acute cholecystitis 06/05/2016  . Diabetes mellitus without complication (HCC) 05/18/2016  . Cholelithiasis 04/09/2016  . Major depressive disorder, single episode 04/09/2016  . Leg DVT (deep venous thromboembolism), chronic (HCC) 12/08/2014  . Family history of diabetes mellitus (DM) 12/08/2014  . Moderate major depression, single episode (HCC) 08/22/2011  . Constipation 11/22/2010  . ANKLE EDEMA 10/19/2010  . Essential hypertension 10/12/2010    Past Surgical History:  Procedure Laterality Date  . CHOLECYSTECTOMY N/A 06/07/2016   Procedure: LAPAROSCOPIC CHOLECYSTECTOMY WITH ATTEMPTED INTRAOPERATIVE CHOLANGIOGRAM;  Surgeon: Violeta Gelinas, MD;  Location: [facility] OR;  Service: General;  Laterality: N/A;  . LIGAMENT REPAIR     R forearm  . TOTAL KNEE ARTHROPLASTY Right 04/17/2017   Procedure: RIGHT TOTAL KNEE ARTHROPLASTY;  Surgeon: Tarry Kos, MD;  Location: [facility] OR;  Service: Orthopedics;  Laterality: Right;  . TOTAL KNEE ARTHROPLASTY Left 04/28/2018   Procedure: LEFT TOTAL KNEE ARTHROPLASTY;  Surgeon: Tarry Kos, MD;  Location: [facility] OR;  Service: Orthopedics;  Laterality: Left;        Home Medications    Prior to Admission medications   Medication Sig Start Date End Date Taking? Authorizing Provider  atorvastatin (LIPITOR) 20 MG tablet Take 1 tablet (20 mg total) by mouth daily. 06/18/18  Yes Georgian Co  M, PA-C  cetirizine (ZYRTEC) 10 MG tablet TAKE 1 TABLET BY MOUTH DAILY. Patient taking differently: Take 10 mg by mouth daily.  02/11/18  Yes Hoy Register, MD  FLUoxetine (PROZAC) 20 MG tablet Take 2 tablets (40 mg total) by mouth daily. 06/18/18  Yes Anders Simmonds, PA-C  gabapentin (NEURONTIN) 300 MG capsule Take 1 capsule (300 mg total) by mouth 3 (three) times daily. 06/18/18  Yes McClung, Angela M, PA-C  glipiZIDE (GLUCOTROL) 5 MG tablet Take 1 tablet (5 mg total) by mouth 2 (two) times  daily before a meal. 06/18/18  Yes McClung, Angela M, PA-C  hydrochlorothiazide (HYDRODIURIL) 25 MG tablet Take 1 tablet (25 mg total) by mouth daily. 06/18/18  Yes McClung, Angela M, PA-C  lisinopril (PRINIVIL,ZESTRIL) 10 MG tablet Take 1 tablet (10 mg total) by mouth daily. 06/18/18  Yes McClung, Angela M, PA-C  metFORMIN (GLUCOPHAGE) 500 MG tablet Take 2 tablets (1,000 mg total) by mouth 2 (two) times daily with a meal. Patient taking differently: Take 500 mg by mouth 2 (two) times daily with a meal.  06/18/18  Yes McClung, Angela M, PA-C  rivaroxaban (XARELTO) 20 MG TABS tablet Take 1 tablet (20 mg total) by mouth daily. Patient taking differently: Take 20 mg by mouth daily with lunch.  06/18/18  Yes Georgian Co M, PA-C  sildenafil (VIAGRA) 50 MG tablet Take 50 mg by mouth daily as needed for erectile dysfunction (at least 24 hours between doses).   Yes [provider]  Blood Glucose Monitoring Suppl (TRUE METRIX METER) DEVI 1 each by Does not apply route 3 (three) times daily before meals. 07/25/16   Hoy Register, MD  dicyclomine (BENTYL) 20 MG tablet Take 1 tablet (20 mg total) by mouth 2 (two) times daily. Patient not taking: Reported on 07/14/2018 11/04/17   Jaynie Crumble, PA-C  diphenoxylate-atropine (LOMOTIL) 2.5-0.025 MG tablet Take 1 tablet by mouth 4 (four) times daily as needed for diarrhea or loose stools. Patient not taking: Reported on 07/14/2018 11/13/17   Gilda Crease, MD  glucose blood (TRUE METRIX BLOOD GLUCOSE TEST) test strip Used daily before meals 07/25/16   Hoy Register, MD  methocarbamol (ROBAXIN) 750 MG tablet Take 1 tablet (750 mg total) by mouth 2 (two) times daily as needed for muscle spasms. Patient not taking: Reported on 07/14/2018 04/28/18   Tarry Kos, MD  ondansetron (ZOFRAN) 4 MG tablet Take 1 tablet (4 mg total) by mouth every 6 (six) hours. Patient not taking: Reported on 07/14/2018 11/04/17   Jaynie Crumble, PA-C  promethazine  (PHENERGAN) 25 MG tablet Take 1 tablet (25 mg total) by mouth every 6 (six) hours as needed for nausea. Patient not taking: Reported on 06/18/2018 04/28/18   Tarry Kos, MD  senna-docusate (SENOKOT S) 8.6-50 MG tablet Take 1 tablet by mouth at bedtime as needed. Patient not taking: Reported on 07/14/2018 04/28/18   Tarry Kos, MD  traMADol (ULTRAM) 50 MG tablet Take 1 tablet (50 mg total) by mouth every 8 (eight) hours as needed. Patient not taking: Reported on 07/14/2018 06/18/18   Anders Simmonds, PA-C  TRUEPLUS LANCETS 28G MISC Use daily before meals 07/25/16   Hoy Register, MD    Family History Family History  Problem Relation Age of Onset  . Diabetes Mother   . Diabetes Father   . Hypertension Sister     Social History Social History   Tobacco Use  . Smoking status: Never Smoker  . Smokeless tobacco: Never  Used  Substance Use Topics  . Alcohol use: Yes    Alcohol/week: 1.0 - 2.0 standard drinks    Types: 1 - 2 Cans of beer per week    Comment: doesn't drink all the drink  . Drug use: No    Types: Marijuana    Comment: last time 1989     Allergies   Patient has no known allergies.   Review of Systems Review of Systems  Musculoskeletal: Positive for arthralgias.  All other systems reviewed and are negative.    Physical Exam Updated Vital Signs BP (!) 143/94 (BP Location: Right Arm)   Pulse 73   Temp 98.4 F (36.9 C) (Oral)   Resp 16   SpO2 97%   Physical Exam  Constitutional: He is oriented to person, place, and time. He appears well-developed and well-nourished.  HENT:  Head: Normocephalic and atraumatic.  Mouth/Throat: Oropharynx is clear and moist.  Eyes: Pupils are equal, round, and reactive to light. Conjunctivae and EOM are normal.  Neck: Normal range of motion.  Cardiovascular: Normal rate, regular rhythm and normal heart sounds.  Pulmonary/Chest: Effort normal and breath sounds normal. No stridor. No respiratory distress.  Abdominal:  Soft. Bowel sounds are normal. There is no tenderness. There is no rebound.  Musculoskeletal: Normal range of motion.  Both knees with well healed midline TKA surgical incisions; there is no appreciable edema, swelling, erythema, effusion, or signs of trauma of either knee Lower legs atraumatic as well, possible slight amount of swelling in right calf compared with left; there is some chronic appearing skin changes of right lower leg, most consistent with venous stasis; DP pulses intact bilaterally; normal plantar/dorsiflexion  Neurological: He is alert and oriented to person, place, and time.  Skin: Skin is warm and dry.  Psychiatric: He has a normal mood and affect.  Nursing note and vitals reviewed.    ED Treatments / Results  Labs (all labs ordered are listed, but only abnormal results are displayed) Labs Reviewed  I-STAT CHEM 8, ED - Abnormal; Notable for the following components:      Result Value   Glucose, Bld 131 (*)    Calcium, Ion 1.12 (*)    All other components within normal limits    EKG None  Radiology No results found.  Procedures Procedures (including critical care time)  Medications Ordered in ED Medications  oxyCODONE-acetaminophen (PERCOCET/ROXICET) 5-325 MG per tablet 1 tablet (1 tablet Oral Given 07/14/18 2320)     Initial Impression / Assessment and Plan / ED Course  I have reviewed the triage vital signs and the nursing notes.  Pertinent labs & imaging results that were available during my care of the patient were reviewed by me and considered in my medical decision making (see chart for details).  47 year old male here with bilateral knee pain.  Has had both knees replaced, left most recent on 04/28/2018.  He has not had any complications but continues to have ongoing pain.  Reports he was initially on oxycodone operatively, recently on tramadol from his PCP.  He is only been back to see his surgeon once since surgery.  Reports ongoing pain in the  left knee without new injury, trauma, or falls.  He has not had any fever, chills, or significant knee swelling.  Does report some pain in the right calf.  History of DVT but on Xarelto and reports he has been compliant.  On exam both knees are overall normal in appearance without signs of infection.  Right lower leg does have trace amount of swelling compared with the left and some chronic skin changes consistent with venous stasis.  I do not appreciate any signs of cellulitis or septic joint.  He has not had any falls or trauma to indicate acute damage to his prosthesis.  Family is concerned about why he still has ongoing pain.  Based on chart review it does appear he had some issues completing all of his physical therapy so may have some muscle atrophy.  Given history of DVT and slight swelling of right calf will arrange for outpatient Dopplers in the morning as they are unavailable at this hour.  Chemistry panel was obtained and is reassuring.  Patient was given dose of pain medication here, but as he is over 2 months postop I discussed with him and his wife that further pain control should come directly from his surgeon or his primary care doctor.  He can take tylenol/motrin for now.  He will follow-up as scheduled.  He can return here for any new/acute changes.  Final Clinical Impressions(s) / ED Diagnoses   Final diagnoses:  Chronic pain of both knees    ED Discharge Orders         Ordered    [facility]OUS     07/15/18 0004           Garlon Hatchet, PA-C 07/15/18 1610    Alvira Monday, MD 07/17/18 2141

## 2018-07-15 ENCOUNTER — Emergency Department (HOSPITAL_COMMUNITY)
Admission: EM | Admit: 2018-07-15 | Discharge: 2018-07-15 | Disposition: A | Discharge status: Home / Self Care | Payer: Self-pay | Attending: Emergency Medicine | Admitting: Emergency Medicine

## 2018-07-15 ENCOUNTER — Encounter (HOSPITAL_COMMUNITY): Payer: Self-pay | Admitting: Emergency Medicine

## 2018-07-15 ENCOUNTER — Ambulatory Visit (HOSPITAL_COMMUNITY)
Admission: RE | Admit: 2018-07-15 | Discharge: 2018-07-15 | Disposition: A | Discharge status: Home / Self Care | Payer: Self-pay | Source: Ambulatory Visit | Attending: Emergency Medicine | Admitting: Emergency Medicine

## 2018-07-15 ENCOUNTER — Ambulatory Visit (HOSPITAL_COMMUNITY): Admission: RE | Admit: 2018-07-15 | Payer: Self-pay | Source: Ambulatory Visit

## 2018-07-15 DIAGNOSIS — F329 Major depressive disorder, single episode, unspecified: Secondary | ICD-10-CM | POA: Insufficient documentation

## 2018-07-15 DIAGNOSIS — R52 Pain, unspecified: Secondary | ICD-10-CM

## 2018-07-15 DIAGNOSIS — M79661 Pain in right lower leg: Secondary | ICD-10-CM | POA: Insufficient documentation

## 2018-07-15 DIAGNOSIS — D573 Sickle-cell trait: Secondary | ICD-10-CM | POA: Insufficient documentation

## 2018-07-15 DIAGNOSIS — I82462 Acute embolism and thrombosis of left calf muscular vein: Secondary | ICD-10-CM

## 2018-07-15 DIAGNOSIS — E119 Type 2 diabetes mellitus without complications: Secondary | ICD-10-CM | POA: Insufficient documentation

## 2018-07-15 DIAGNOSIS — Z96653 Presence of artificial knee joint, bilateral: Secondary | ICD-10-CM | POA: Insufficient documentation

## 2018-07-15 DIAGNOSIS — Z7901 Long term (current) use of anticoagulants: Secondary | ICD-10-CM | POA: Insufficient documentation

## 2018-07-15 DIAGNOSIS — I82492 Acute embolism and thrombosis of other specified deep vein of left lower extremity: Secondary | ICD-10-CM | POA: Insufficient documentation

## 2018-07-15 DIAGNOSIS — I10 Essential (primary) hypertension: Secondary | ICD-10-CM | POA: Insufficient documentation

## 2018-07-15 DIAGNOSIS — Z9049 Acquired absence of other specified parts of digestive tract: Secondary | ICD-10-CM | POA: Insufficient documentation

## 2018-07-15 DIAGNOSIS — Z7984 Long term (current) use of oral hypoglycemic drugs: Secondary | ICD-10-CM | POA: Insufficient documentation

## 2018-07-15 DIAGNOSIS — Z79899 Other long term (current) drug therapy: Secondary | ICD-10-CM | POA: Insufficient documentation

## 2018-07-15 DIAGNOSIS — M79662 Pain in left lower leg: Secondary | ICD-10-CM | POA: Insufficient documentation

## 2018-07-15 LAB — BASIC METABOLIC PANEL
Anion gap: 11 (ref 5–15)
BUN: 6 mg/dL (ref 6–20)
CO2: 26 mmol/L (ref 22–32)
CREATININE: 0.93 mg/dL (ref 0.61–1.24)
Calcium: 9.2 mg/dL (ref 8.9–10.3)
Chloride: 101 mmol/L (ref 98–111)
GFR calc Af Amer: >60 mL/min (ref >60)
Glucose, Bld: 166 mg/dL — ABNORMAL HIGH (ref 70–99)
Potassium: 3.9 mmol/L (ref 3.5–5.1)
Sodium: 138 mmol/L (ref 135–145)

## 2018-07-15 LAB — CBC
HCT: 44.3 % (ref 39.0–52.0)
Hemoglobin: 15.2 g/dL (ref 13.0–17.0)
MCH: 27.8 pg (ref 26.0–34.0)
MCHC: 34.3 g/dL (ref 30.0–36.0)
MCV: 81.1 fL (ref 80.0–100.0)
PLATELETS: 208 10*3/uL (ref 150–400)
RBC: 5.46 MIL/uL (ref 4.22–5.81)
RDW: 13.8 % (ref 11.5–15.5)
WBC: 12.1 10*3/uL — ABNORMAL HIGH (ref 4.0–10.5)
nRBC: 0 % (ref 0.0–0.2)

## 2018-07-15 MED ORDER — ACETAMINOPHEN 325 MG PO TABS
650.0000 mg | ORAL_TABLET | Freq: Once | ORAL | Status: AC
  Administered 2018-07-15: 650 mg via ORAL
  Filled 2018-07-15: qty 2

## 2018-07-15 MED ORDER — LIDOCAINE 5 % EX PTCH
1.0000 | MEDICATED_PATCH | CUTANEOUS | Status: DC
  Administered 2018-07-15: 1 via TRANSDERMAL
  Filled 2018-07-15: qty 1

## 2018-07-15 MED ORDER — LIDOCAINE 5 % EX PTCH: 1.0000 | MEDICATED_PATCH | CUTANEOUS | 0 refills | Status: AC

## 2018-07-15 NOTE — ED Notes (Addendum)
Results reviewed, no changes in acuity at this time 

## 2018-07-15 NOTE — Discharge Instructions (Signed)
You can take tylenol or motrin for pain. Return here in the morning for ultrasound of the legs to assess for DVT.  If any acute findings he will be directed to the emergency room. Follow-up with your primary care doctor or your orthopedic surgeon for ongoing pain control. Return to the ED for new or worsening symptoms.

## 2018-07-15 NOTE — Progress Notes (Signed)
Bilateral lower extremity venous duplex has been completed. There is evidence of acute intramuscular deep vein thrombosis involving the gastrocnemius veins of the left lower extremity. There is also evidence of chronic deep vein thrombosis involving the peroneal veins of the left lower extremity. Negative for obvious evidence of DVT on the right. Results were given to the charge nurse, Clydie Braun.  07/15/18 4:02 PM Olen Cordial RVT

## 2018-07-15 NOTE — ED Triage Notes (Addendum)
Pt presents to ED for asssessment after having positive DVT study to bilateral legs.  Pt is currently on Xarelto.  Denies SOB or chest pain

## 2018-07-15 NOTE — ED Notes (Signed)
E-signature not available, pt verbalized understanding of DC instructions  

## 2018-07-15 NOTE — ED Notes (Signed)
Patient verbalizes understanding of discharge instructions. Opportunity for questioning and answers were provided. Armband removed by staff, pt discharged from ED ambulatory.   

## 2018-07-15 NOTE — ED Notes (Signed)
Patient ambulated up to desk.  Asked about wait times and delays.  This RN updated him.  Patient states he is in pain.  This RN offered Tylenol or Ibuprofen.  Patient refused.  States he would like to eat.  This RN encouraged him not to eat or drink until he is seen by a provider.

## 2018-07-15 NOTE — ED Provider Notes (Signed)
[facility] EMERGENCY DEPARTMENT Provider Note   CSN: [id number] Arrival date & time: 07/15/18  1600     History   Chief Complaint Chief Complaint  Patient presents with  . +DVT    HPI David Dunlap is a 47 y.o. male.  Positive DVT study today in the left lower leg. Patient on xarelto. Recent left knee surgery.   The history is provided by the patient.  Leg Pain   This is a new problem. The current episode started more than 2 days ago. The problem occurs constantly. The problem has not changed since onset.The pain is present in the left lower leg. The quality of the pain is described as aching. The pain is at a severity of 2/10. The pain is mild. Pertinent negatives include no numbness, full range of motion, no stiffness, no tingling and no itching. The symptoms are aggravated by heat. He has tried OTC pain medications for the symptoms. The treatment provided no relief. There has been no history of extremity trauma.    Past Medical History:  Diagnosis Date  . Arthritis   . Depression   . Diabetes mellitus without complication (HCC) 05/2016   NEW ONSET    WITH FAMILY HISTORY  . DVT (deep venous thrombosis) (HCC)   . Essential hypertension   . Gallstones 05/2016  . GERD (gastroesophageal reflux disease)   . Sickle cell anemia (HCC)    Sickle trait  . Sleep apnea    Cpap    Patient Active Problem List   Diagnosis Date Noted  . S/P total knee replacement, left 04/28/2018  . Primary osteoarthritis of left knee   . Unilateral primary osteoarthritis, left knee 04/10/2018  . Obstructive sleep apnea 09/05/2017  . S/P TKR (total knee replacement), right 04/17/2017  . GERD (gastroesophageal reflux disease) 11/27/2016  . Unilateral primary osteoarthritis, right knee 10/18/2016  . Neuropathy 07/25/2016  . Acute cholecystitis 06/05/2016  . Diabetes mellitus without complication (HCC) 05/18/2016  . Cholelithiasis 04/09/2016  . Major depressive disorder,  single episode 04/09/2016  . Leg DVT (deep venous thromboembolism), chronic (HCC) 12/08/2014  . Family history of diabetes mellitus (DM) 12/08/2014  . Moderate major depression, single episode (HCC) 08/22/2011  . Constipation 11/22/2010  . ANKLE EDEMA 10/19/2010  . Essential hypertension 10/12/2010    Past Surgical History:  Procedure Laterality Date  . CHOLECYSTECTOMY N/A 06/07/2016   Procedure: LAPAROSCOPIC CHOLECYSTECTOMY WITH ATTEMPTED INTRAOPERATIVE CHOLANGIOGRAM;  Surgeon: Violeta Gelinas, MD;  Location: [facility] OR;  Service: General;  Laterality: N/A;  . LIGAMENT REPAIR     R forearm  . TOTAL KNEE ARTHROPLASTY Right 04/17/2017   Procedure: RIGHT TOTAL KNEE ARTHROPLASTY;  Surgeon: Tarry Kos, MD;  Location: [facility] OR;  Service: Orthopedics;  Laterality: Right;  . TOTAL KNEE ARTHROPLASTY Left 04/28/2018   Procedure: LEFT TOTAL KNEE ARTHROPLASTY;  Surgeon: Tarry Kos, MD;  Location: [facility] OR;  Service: Orthopedics;  Laterality: Left;        Home Medications    Prior to Admission medications   Medication Sig Start Date End Date Taking? Authorizing Provider  atorvastatin (LIPITOR) 20 MG tablet Take 1 tablet (20 mg total) by mouth daily. 06/18/18   Anders Simmonds, PA-C  Blood Glucose Monitoring Suppl (TRUE METRIX METER) DEVI 1 each by Does not apply route 3 (three) times daily before meals. 07/25/16   Hoy Register, MD  cetirizine (ZYRTEC) 10 MG tablet TAKE 1 TABLET BY MOUTH DAILY. Patient taking differently: Take 10 mg by mouth  daily.  02/11/18   Hoy Register, MD  dicyclomine (BENTYL) 20 MG tablet Take 1 tablet (20 mg total) by mouth 2 (two) times daily. Patient not taking: Reported on 07/14/2018 11/04/17   Jaynie Crumble, PA-C  diphenoxylate-atropine (LOMOTIL) 2.5-0.025 MG tablet Take 1 tablet by mouth 4 (four) times daily as needed for diarrhea or loose stools. Patient not taking: Reported on 07/14/2018 11/13/17   Gilda Crease, MD  FLUoxetine (PROZAC) 20 MG tablet Take  2 tablets (40 mg total) by mouth daily. 06/18/18   Anders Simmonds, PA-C  gabapentin (NEURONTIN) 300 MG capsule Take 1 capsule (300 mg total) by mouth 3 (three) times daily. 06/18/18   Anders Simmonds, PA-C  glipiZIDE (GLUCOTROL) 5 MG tablet Take 1 tablet (5 mg total) by mouth 2 (two) times daily before a meal. 06/18/18   McClung, Marzella Schlein, PA-C  glucose blood (TRUE METRIX BLOOD GLUCOSE TEST) test strip Used daily before meals 07/25/16   Hoy Register, MD  hydrochlorothiazide (HYDRODIURIL) 25 MG tablet Take 1 tablet (25 mg total) by mouth daily. 06/18/18   Anders Simmonds, PA-C  lidocaine (LIDODERM) 5 % Place 1 patch onto the skin daily for 30 doses. Remove & Discard patch within 12 hours or as directed by MD 07/15/18 08/14/18  Virgina Norfolk, DO  lisinopril (PRINIVIL,ZESTRIL) 10 MG tablet Take 1 tablet (10 mg total) by mouth daily. 06/18/18   Anders Simmonds, PA-C  metFORMIN (GLUCOPHAGE) 500 MG tablet Take 2 tablets (1,000 mg total) by mouth 2 (two) times daily with a meal. Patient taking differently: Take 500 mg by mouth 2 (two) times daily with a meal.  06/18/18   McClung, Marzella Schlein, PA-C  methocarbamol (ROBAXIN) 750 MG tablet Take 1 tablet (750 mg total) by mouth 2 (two) times daily as needed for muscle spasms. Patient not taking: Reported on 07/14/2018 04/28/18   Tarry Kos, MD  ondansetron (ZOFRAN) 4 MG tablet Take 1 tablet (4 mg total) by mouth every 6 (six) hours. Patient not taking: Reported on 07/14/2018 11/04/17   Jaynie Crumble, PA-C  promethazine (PHENERGAN) 25 MG tablet Take 1 tablet (25 mg total) by mouth every 6 (six) hours as needed for nausea. Patient not taking: Reported on 06/18/2018 04/28/18   Tarry Kos, MD  rivaroxaban (XARELTO) 20 MG TABS tablet Take 1 tablet (20 mg total) by mouth daily. Patient taking differently: Take 20 mg by mouth daily with lunch.  06/18/18   Anders Simmonds, PA-C  senna-docusate (SENOKOT S) 8.6-50 MG tablet Take 1 tablet by mouth at bedtime  as needed. Patient not taking: Reported on 07/14/2018 04/28/18   Tarry Kos, MD  sildenafil (VIAGRA) 50 MG tablet Take 50 mg by mouth daily as needed for erectile dysfunction (at least 24 hours between doses).    [provider]  traMADol (ULTRAM) 50 MG tablet Take 1 tablet (50 mg total) by mouth every 8 (eight) hours as needed. Patient not taking: Reported on 07/14/2018 06/18/18   Anders Simmonds, PA-C  TRUEPLUS LANCETS 28G MISC Use daily before meals 07/25/16   Hoy Register, MD    Family History Family History  Problem Relation Age of Onset  . Diabetes Mother   . Diabetes Father   . Hypertension Sister     Social History Social History   Tobacco Use  . Smoking status: Never Smoker  . Smokeless tobacco: Never Used  Substance Use Topics  . Alcohol use: Yes    Alcohol/week: 1.0 - 2.0  standard drinks    Types: 1 - 2 Cans of beer per week    Comment: doesn't drink all the drink  . Drug use: No    Types: Marijuana    Comment: last time 1989     Allergies   Patient has no known allergies.   Review of Systems Review of Systems  Constitutional: Negative for chills and fever.  HENT: Negative for ear pain and sore throat.   Eyes: Negative for pain and visual disturbance.  Respiratory: Negative for cough and shortness of breath.   Cardiovascular: Negative for chest pain and palpitations.  Gastrointestinal: Negative for abdominal pain and vomiting.  Genitourinary: Negative for dysuria and hematuria.  Musculoskeletal: Negative for arthralgias, back pain and stiffness.  Skin: Negative for color change, itching and rash.  Neurological: Negative for tingling, seizures, syncope and numbness.  All other systems reviewed and are negative.    Physical Exam Updated Vital Signs  ED Triage Vitals  Enc Vitals Group     BP 07/15/18 1606 (!) 141/95     Pulse Rate 07/15/18 1606 82     Resp 07/15/18 1606 18     Temp 07/15/18 1606 99 F (37.2 C)     Temp Source  07/15/18 1606 Oral     SpO2 07/15/18 1606 94 %     Weight --      Height --      Head Circumference --      Peak Flow --      Pain Score 07/15/18 1608 9     Pain Loc --      Pain Edu? --      Excl. in GC? --     Physical Exam  Constitutional: He appears well-developed and well-nourished.  HENT:  Head: Normocephalic and atraumatic.  Eyes: Pupils are equal, round, and reactive to light. Conjunctivae and EOM are normal.  Neck: Normal range of motion. Neck supple.  Cardiovascular: Normal rate, regular rhythm and normal heart sounds.  No murmur heard. Pulmonary/Chest: Effort normal and breath sounds normal. No respiratory distress.  Abdominal: Soft. There is no tenderness.  Musculoskeletal: He exhibits tenderness (TTP to left calf). He exhibits no edema.  Neurological: He is alert.  Skin: Skin is warm and dry.  Psychiatric: He has a normal mood and affect.  Nursing note and vitals reviewed.    ED Treatments / Results  Labs (all labs ordered are listed, but only abnormal results are displayed) Labs Reviewed  CBC - Abnormal; Notable for the following components:      Result Value   WBC 12.1 (*)    All other components within normal limits  BASIC METABOLIC PANEL - Abnormal; Notable for the following components:   Glucose, Bld 166 (*)    All other components within normal limits    EKG None  Radiology Le Venous  Result Date: 07/15/2018  Lower Venous Study Indications: Pain.  Limitations: Body habitus and poor ultrasound/tissue interface. Performing Technologist: Chanda Busing RVT  Examination Guidelines: A complete evaluation includes B-mode imaging, spectral Doppler, color Doppler, and power Doppler as needed of all accessible portions of each vessel. Bilateral testing is considered an integral part of a complete examination. Limited examinations for reoccurring indications may be performed as noted.  Right Venous Findings:  +---------+---------------+---------+-----------+----------+-------+          CompressibilityPhasicitySpontaneityPropertiesSummary +---------+---------------+---------+-----------+----------+-------+ CFV      Full           Yes      Yes                          +---------+---------------+---------+-----------+----------+-------+  SFJ      Full                                                 +---------+---------------+---------+-----------+----------+-------+ FV Prox  Full                                                 +---------+---------------+---------+-----------+----------+-------+ FV Mid   Full                                                 +---------+---------------+---------+-----------+----------+-------+ FV DistalFull                                                 +---------+---------------+---------+-----------+----------+-------+ PFV      Full                                                 +---------+---------------+---------+-----------+----------+-------+ POP      Full           Yes      Yes                          +---------+---------------+---------+-----------+----------+-------+ PTV      Full                                                 +---------+---------------+---------+-----------+----------+-------+ PERO     Full                                                 +---------+---------------+---------+-----------+----------+-------+  Left Venous Findings: +---------+---------------+---------+-----------+----------+-------+          CompressibilityPhasicitySpontaneityPropertiesSummary +---------+---------------+---------+-----------+----------+-------+ CFV      Full           Yes      Yes                          +---------+---------------+---------+-----------+----------+-------+ SFJ      Full                                                  +---------+---------------+---------+-----------+----------+-------+ FV Prox  Full                                                 +---------+---------------+---------+-----------+----------+-------+  FV Mid   Full                                                 +---------+---------------+---------+-----------+----------+-------+ FV DistalFull                                                 +---------+---------------+---------+-----------+----------+-------+ PFV      Full                                                 +---------+---------------+---------+-----------+----------+-------+ POP      Full           Yes      Yes                          +---------+---------------+---------+-----------+----------+-------+ PTV      Full                                                 +---------+---------------+---------+-----------+----------+-------+ PERO     Partial                                      Chronic +---------+---------------+---------+-----------+----------+-------+ Gastroc  Partial                                      Acute   +---------+---------------+---------+-----------+----------+-------+    Summary: Right: There is no evidence of deep vein thrombosis in the lower extremity. No cystic structure found in the popliteal fossa. Left: Findings consistent with acute deep vein thrombosis involving the left gastrocnemius vein. 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 No cystic structure found in the popliteal fossa.  *See table(s) above for measurements and observations. Electronically signed by Coral Else MD on 07/15/2018 at 5:40:13 PM.    Final     Procedures Procedures (including critical care time)  Medications Ordered in ED Medications  lidocaine (LIDODERM) 5 % 1 patch (has no administration in time range)  acetaminophen (TYLENOL) tablet 650 mg (650 mg Oral Given 07/15/18 2032)     Initial  Impression / Assessment and Plan / ED Course  I have reviewed the triage vital signs and the nursing notes.  Pertinent labs & imaging results that were available during my care of the patient were reviewed by me and considered in my medical decision making (see chart for details).     David Dunlap is a 47 year old male with history of DVT, recent left knee surgery who presents to the ED with left calf pain.  Patient had DVT study done earlier this morning that showed new acute left DVT in the gastrocnemius veins.  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  Patient had recent left knee surgery.  Has had some left calf pain for the last several weeks. Tender on exam, clear breath sounds, no tachycardia/no SOB. Patient denies any shortness of breath, chest pain.  Given new DVT with patient on Xarelto did contact hematology who recommends that patient continue dose of Xarelto.  No need for changes medication at this time.  Recommend follow-up with primary care doctor and patient was discharged from ED in good condition. Given lidocaine patch in the ED, rx for them as well.   Final Clinical Impressions(s) / ED Diagnoses   Final diagnoses:  Acute deep vein thrombosis (DVT) of calf muscle vein of left lower extremity    ED Discharge Orders         Ordered    lidocaine (LIDODERM) 5 %  Every 24 hours     07/15/18 2052           Virgina Norfolk, DO 07/15/18 2054

## 2018-07-17 ENCOUNTER — Encounter: Payer: Self-pay | Admitting: Family Medicine

## 2018-07-17 ENCOUNTER — Ambulatory Visit: Payer: Self-pay | Attending: Family Medicine | Admitting: Family Medicine

## 2018-07-17 VITALS — BP 123/69 | HR 96 | Temp 98.4°F | Resp 16 | Wt 281.8 lb

## 2018-07-17 DIAGNOSIS — M25562 Pain in left knee: Secondary | ICD-10-CM | POA: Insufficient documentation

## 2018-07-17 DIAGNOSIS — I82409 Acute embolism and thrombosis of unspecified deep veins of unspecified lower extremity: Secondary | ICD-10-CM

## 2018-07-17 DIAGNOSIS — K219 Gastro-esophageal reflux disease without esophagitis: Secondary | ICD-10-CM | POA: Insufficient documentation

## 2018-07-17 DIAGNOSIS — Z09 Encounter for follow-up examination after completed treatment for conditions other than malignant neoplasm: Secondary | ICD-10-CM | POA: Insufficient documentation

## 2018-07-17 DIAGNOSIS — I82402 Acute embolism and thrombosis of unspecified deep veins of left lower extremity: Secondary | ICD-10-CM | POA: Insufficient documentation

## 2018-07-17 DIAGNOSIS — Z7901 Long term (current) use of anticoagulants: Secondary | ICD-10-CM

## 2018-07-17 DIAGNOSIS — M5416 Radiculopathy, lumbar region: Secondary | ICD-10-CM | POA: Insufficient documentation

## 2018-07-17 DIAGNOSIS — Z9049 Acquired absence of other specified parts of digestive tract: Secondary | ICD-10-CM | POA: Insufficient documentation

## 2018-07-17 DIAGNOSIS — R200 Anesthesia of skin: Secondary | ICD-10-CM | POA: Insufficient documentation

## 2018-07-17 DIAGNOSIS — M1990 Unspecified osteoarthritis, unspecified site: Secondary | ICD-10-CM | POA: Insufficient documentation

## 2018-07-17 DIAGNOSIS — G4730 Sleep apnea, unspecified: Secondary | ICD-10-CM | POA: Insufficient documentation

## 2018-07-17 DIAGNOSIS — Z96652 Presence of left artificial knee joint: Secondary | ICD-10-CM

## 2018-07-17 DIAGNOSIS — Z8249 Family history of ischemic heart disease and other diseases of the circulatory system: Secondary | ICD-10-CM | POA: Insufficient documentation

## 2018-07-17 DIAGNOSIS — M79605 Pain in left leg: Secondary | ICD-10-CM

## 2018-07-17 DIAGNOSIS — Z96653 Presence of artificial knee joint, bilateral: Secondary | ICD-10-CM | POA: Insufficient documentation

## 2018-07-17 DIAGNOSIS — I10 Essential (primary) hypertension: Secondary | ICD-10-CM | POA: Insufficient documentation

## 2018-07-17 DIAGNOSIS — F329 Major depressive disorder, single episode, unspecified: Secondary | ICD-10-CM | POA: Insufficient documentation

## 2018-07-17 DIAGNOSIS — I82462 Acute embolism and thrombosis of left calf muscular vein: Secondary | ICD-10-CM

## 2018-07-17 DIAGNOSIS — Z86718 Personal history of other venous thrombosis and embolism: Secondary | ICD-10-CM | POA: Insufficient documentation

## 2018-07-17 DIAGNOSIS — E1169 Type 2 diabetes mellitus with other specified complication: Secondary | ICD-10-CM

## 2018-07-17 DIAGNOSIS — D571 Sickle-cell disease without crisis: Secondary | ICD-10-CM | POA: Insufficient documentation

## 2018-07-17 LAB — GLUCOSE, POCT (MANUAL RESULT ENTRY): POC Glucose: 146 mg/dL — AB (ref 70–99)

## 2018-07-17 MED ORDER — TRAMADOL HCL 50 MG PO TABS: ORAL_TABLET | ORAL | 0 refills | Status: DC

## 2018-07-17 MED FILL — traMADol HCL 50 MG TABS: 50 | 5 days supply | Qty: 40 | Fill #0

## 2018-07-17 MED FILL — ?LIDOCAINE 5% PATCH: 5 | 30 days supply | Qty: 30 | Fill #0

## 2018-07-17 NOTE — Progress Notes (Signed)
Subjective:    Patient ID: David Dunlap, male    DOB: 08/30/1971, 47 y.o.   MRN: [id number]  HPI 47 year old male who presents in follow-up of recently diagnosed left lower extremity DVT.  Patient states that he went to the emergency room on 07/14/2018 secondary to complaint of ongoing pain in the left knee and leg status post left knee replacement 04/28/2018.  Patient states that a lower extremely ultrasound was scheduled but could not be done that day therefore patient returned on 07/15/2018 to have the test done and was told to follow-up at the ED as his test for DVT in the left lower leg was positive.  Patient states that he was told to continue the Xarelto that he was already taking and to follow-up with his PCP regarding continued pain status post left knee replacement as well as new DVT.  Patient reports his pain at today's visit as an 8-9 on a 0-to-10 scale.  Patient states that the pain is sometimes sharp and is sometimes tingling.  Patient denies any numbness in the left foot but complains of chronic numbness in the right foot.  Patient does have a history of diabetes and also reports lumbar radiculopathy.  Patient reports that he has also had some swelling in the left lower extremity since his surgery.  Patient denies any unusual bruising or bleeding.  Patient does have a prior history of DVT.  Patient denies any chest pain or shortness of breath. Past Medical History:  Diagnosis Date  . Arthritis   . Depression   . Diabetes mellitus without complication (HCC) 05/2016   NEW ONSET    WITH FAMILY HISTORY  . DVT (deep venous thrombosis) (HCC)   . Essential hypertension   . Gallstones 05/2016  . GERD (gastroesophageal reflux disease)   . Sickle cell anemia (HCC)    Sickle trait  . Sleep apnea    Cpap   Past Surgical History:  Procedure Laterality Date  . CHOLECYSTECTOMY N/A 06/07/2016   Procedure: LAPAROSCOPIC CHOLECYSTECTOMY WITH ATTEMPTED INTRAOPERATIVE CHOLANGIOGRAM;  Surgeon:  Violeta Gelinas, MD;  Location: [facility] OR;  Service: General;  Laterality: N/A;  . LIGAMENT REPAIR     R forearm  . TOTAL KNEE ARTHROPLASTY Right 04/17/2017   Procedure: RIGHT TOTAL KNEE ARTHROPLASTY;  Surgeon: Tarry Kos, MD;  Location: [facility] OR;  Service: Orthopedics;  Laterality: Right;  . TOTAL KNEE ARTHROPLASTY Left 04/28/2018   Procedure: LEFT TOTAL KNEE ARTHROPLASTY;  Surgeon: Tarry Kos, MD;  Location: [facility] OR;  Service: Orthopedics;  Laterality: Left;   Family History  Problem Relation Age of Onset  . Diabetes Mother   . Diabetes Father   . Hypertension Sister    Social History   Tobacco Use  . Smoking status: Never Smoker  . Smokeless tobacco: Never Used  Substance Use Topics  . Alcohol use: Yes    Alcohol/week: 1.0 - 2.0 standard drinks    Types: 1 - 2 Cans of beer per week    Comment: doesn't drink all the drink  . Drug use: No    Comment: last time 1989  No Known Allergies         Review of Systems  Constitutional: Positive for fatigue. Negative for chills and fever.  Respiratory: Negative for cough and shortness of breath.   Cardiovascular: Positive for leg swelling. Negative for chest pain and palpitations.  Gastrointestinal: Negative for abdominal pain and nausea.  Genitourinary: Negative for dysuria and frequency.  Musculoskeletal: Positive for gait  problem and joint swelling. Negative for back pain.  Neurological: Negative for dizziness and headaches.       Objective:   Physical Exam BP 123/69 (BP Location: Right Arm, Patient Position: Sitting, Cuff Size: Large)   Pulse 96   Temp 98.4 F (36.9 C) (Oral)   Resp 16   Wt 281 lb 12.8 oz (127.8 kg)   SpO2 97%   BMI 41.61 kg/m Vital signs and nurse's notes reviewed General- obese male in no acute distress sitting on exam table.  Patient has a quad cane nearby Lungs-clear to auscultation bilaterally Cardiovascular-regular rate and rhythm Abdomen-soft, nontender, patient with truncal obesity Extremities-  patient with trace to 1+ pitting edema to the mid left shin. Musculoskeletal- patient with complaint of generalized tenderness and lateral tenderness to palpation over the left knee.  Patient status post knee replacement.  Patient with acute tenderness to palpation over the left posterior calf Circulation- patient with 1+ dorsalis pedis and posterior tibial pulse left foot, no evidence of cyanosis, normal capillary refill        Assessment & Plan:  2. Type 2 diabetes mellitus with other specified complication, without long-term current use of insulin (HCC) Patient with a history of type 2 diabetes and CMA obtained glucose, random as part of standing protocol.  Patient's last hemoglobin A1c done 8-19 showed good control of diabetes with a value of 7.6.  Patient encouraged to continue adherence to a low carbohydrate diet, continue monitoring his blood sugars and continue current medications. - Glucose (CBG)  1. 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  Patient is status post left knee replacement surgery on 04/28/2018 which was thought to be a possible contributing factor to new DVT but patient has also been on Xarelto and denies any issues with adherence to medication.  Patient will be referred to hematology as well to see if patient may have any hypercoagulability issues which are contributing to recurrent DVT. - Ambulatory referral to Hematology  3. Recurrent deep vein thrombosis (DVT) (HCC) Patient will continue use of Xarelto but will also be referred to hematology to look for a cause of increased coagulability which may be contributing to  patient's recurrent DVT.  Patient denies any history of pulmonary embolism but I am not sure if patient would be a candidate for [place] filter or if this would help reduce his risk of PE in the future as patient did develop new DVT while on Xarelto. - Ambulatory referral to Hematology  4. Anticoagulant long-term use Patient will long-term use of anticoagulant, Xarelto for treatment of DVT and patient will be referred to hematology for further evaluation and treatment due to recurrent DVT - Ambulatory referral to Hematology  5. Left leg pain Patient with complaint of the need for refill of tramadol for left leg pain secondary to recent acute DVT diagnosis in the left calf as well as recent total knee replacement.  Patient is provided with refill of tramadol at today's visit.  Patient is encouraged to keep his upcoming appointment with orthopedics but to return for follow-up if pain is not controlled by tramadol but if patient has any acute worsening of leg pain, chest pain or shortness of breath patient  should go to the emergency department for further evaluation and treatment  6. Status post left knee replacement Patient provided with prescription for tramadol to take for recurrent acute knee pain status post knee replacement as well as acute pain secondary to DVT in the left calf.  Patient is encouraged to call his orthopedic doctor regarding further follow-up of his pain.  Patient will continue the use of Xarelto. - traMADol (ULTRAM) 50 MG tablet; 1-2 pills every 6 hours as needed for pain  Dispense: 40 tablet; Refill: 0  An After Visit Summary was printed and given to the patient.  Return in about 1 week (around 07/24/2018) for 1-2 weeks with PCP-leg pain/DVT.  Been

## 2018-07-17 NOTE — Progress Notes (Signed)
Left leg DVT, pain 8/10 TKR- 04/2018  Refill strips

## 2018-07-22 ENCOUNTER — Ambulatory Visit (INDEPENDENT_AMBULATORY_CARE_PROVIDER_SITE_OTHER): Payer: Self-pay

## 2018-07-22 ENCOUNTER — Ambulatory Visit (INDEPENDENT_AMBULATORY_CARE_PROVIDER_SITE_OTHER): Payer: Self-pay | Admitting: Physician Assistant

## 2018-07-22 ENCOUNTER — Encounter (INDEPENDENT_AMBULATORY_CARE_PROVIDER_SITE_OTHER): Payer: Self-pay | Admitting: Orthopaedic Surgery

## 2018-07-22 DIAGNOSIS — Z6841 Body Mass Index (BMI) 40.0 and over, adult: Secondary | ICD-10-CM

## 2018-07-22 DIAGNOSIS — Z96652 Presence of left artificial knee joint: Secondary | ICD-10-CM

## 2018-07-22 MED ORDER — HYDROCODONE-ACETAMINOPHEN 5-325 MG PO TABS: 1.0000 | ORAL_TABLET | Freq: Two times a day (BID) | ORAL | 0 refills | Status: DC | PRN

## 2018-07-22 NOTE — Progress Notes (Signed)
Post-Op Visit Note   Patient: David Dunlap           Date of Birth: [birth year]           MRN: [id number] Visit Date: 07/22/2018 PCP: Hoy Register, MD   Assessment & Plan:  Chief Complaint:  Chief Complaint  Patient presents with  . Left Knee - Routine Post Op   Visit Diagnoses:  1. S/P total knee replacement, left   2. Morbid obesity (HCC)   3. Body mass index 40.0-44.9, adult [facility])     Plan: Patient is a pleasant 47 year old gentleman who presents our clinic today 85 days status post left total knee replacement, date of surgery 04/28/2018.  He has been doing well in regards to range of motion but still exhibiting pain.  A few weeks back, he noticed increased left calf pain and was seen in the ED.  It was noted that he had an acute DVT.  He does have a history of bilateral lower extremity DVTs with an ultrasound showing chronic DVT right lower extremity.  He has been on Xarelto for this.  He did develop this new acute DVT while on Xarelto.  Of note, he was given a physical therapy prescription 2 weeks out from surgery, but never went to formal physical therapy as he did not have insurance and could not afford this on his own.  Examination of his left leg today reveals a well-healed surgical incision without evidence of infection or cellulitis.  He does have mild to moderate swelling left lower extremity.  He does have left calf pain and tenderness.  Positive Homans.  Range of motion 0 to 120 degrees.  He is stable valgus varus stress.  He is neurovascularly intact distally.  In regards to his left knee, I think he is doing fine with range of motion.  We will write a new prescription for physical therapy to work on strengthening.  This was given to the patient today.  He still does not have insurance, but will try to attend if able to financially do so.  Otherwise, continue with home exercise program.  He will follow-up with [place] in 3 months time for recheck.  In regards to the DVT, his  PCP has previously been managing this.  He has been referred to hematology to further be worked up for clotting disorder.  He does state that he called the number given but hung up when he realized it was an oncology office.  I have instructed the patient to call back as this is the correct number to call.  I have also instructed the patient to return to the ED should he develop any chest pain or shortness of breath.  Call with concerning questions or concerns in the meantime. The patient meets the AMA guidelines for Morbid (severe) obesity with a BMI > 40.0 and I have recommended weight loss.   Follow-Up Instructions: Return in about 3 months (around 10/22/2018).   Orders:  Orders Placed This Encounter  Procedures  . XR Knee 1-2 Views Left   Meds ordered this encounter  Medications  . HYDROcodone-acetaminophen (NORCO) 5-325 MG tablet    Sig: Take 1 tablet by mouth 2 (two) times daily as needed for moderate pain.    Dispense:  14 tablet    Refill:  0    Imaging: Xr Knee 1-2 Views Left  Result Date: 07/22/2018 X-rays demonstrate a well-seated prosthesis.  No evidence of subsidence   PMFS History: Patient Active Problem  List   Diagnosis Date Noted  . Morbid obesity (HCC) 07/22/2018  . Body mass index 40.0-44.9, adult (HCC) 07/22/2018  . S/P total knee replacement, left 04/28/2018  . Primary osteoarthritis of left knee   . Unilateral primary osteoarthritis, left knee 04/10/2018  . Obstructive sleep apnea 09/05/2017  . S/P TKR (total knee replacement), right 04/17/2017  . GERD (gastroesophageal reflux disease) 11/27/2016  . Unilateral primary osteoarthritis, right knee 10/18/2016  . Neuropathy 07/25/2016  . Acute cholecystitis 06/05/2016  . Diabetes mellitus without complication (HCC) 05/18/2016  . Cholelithiasis 04/09/2016  . Major depressive disorder, single episode 04/09/2016  . Leg DVT (deep venous thromboembolism), chronic (HCC) 12/08/2014  . Family history of diabetes  mellitus (DM) 12/08/2014  . Moderate major depression, single episode (HCC) 08/22/2011  . Constipation 11/22/2010  . ANKLE EDEMA 10/19/2010  . Essential hypertension 10/12/2010   Past Medical History:  Diagnosis Date  . Arthritis   . Depression   . Diabetes mellitus without complication (HCC) 05/2016   NEW ONSET    WITH FAMILY HISTORY  . DVT (deep venous thrombosis) (HCC)   . Essential hypertension   . Gallstones 05/2016  . GERD (gastroesophageal reflux disease)   . Sickle cell anemia (HCC)    Sickle trait  . Sleep apnea    Cpap    Family History  Problem Relation Age of Onset  . Diabetes Mother   . Diabetes Father   . Hypertension Sister     Past Surgical History:  Procedure Laterality Date  . CHOLECYSTECTOMY N/A 06/07/2016   Procedure: LAPAROSCOPIC CHOLECYSTECTOMY WITH ATTEMPTED INTRAOPERATIVE CHOLANGIOGRAM;  Surgeon: Violeta Gelinas, MD;  Location: [facility] OR;  Service: General;  Laterality: N/A;  . LIGAMENT REPAIR     R forearm  . TOTAL KNEE ARTHROPLASTY Right 04/17/2017   Procedure: RIGHT TOTAL KNEE ARTHROPLASTY;  Surgeon: Tarry Kos, MD;  Location: [facility] OR;  Service: Orthopedics;  Laterality: Right;  . TOTAL KNEE ARTHROPLASTY Left 04/28/2018   Procedure: LEFT TOTAL KNEE ARTHROPLASTY;  Surgeon: Tarry Kos, MD;  Location: [facility] OR;  Service: Orthopedics;  Laterality: Left;   Social History   Occupational History  . Not on file  Tobacco Use  . Smoking status: Never Smoker  . Smokeless tobacco: Never Used  Substance and Sexual Activity  . Alcohol use: Yes    Alcohol/week: 1.0 - 2.0 standard drinks    Types: 1 - 2 Cans of beer per week    Comment: doesn't drink all the drink  . Drug use: No    Comment: last time 1989  . Sexual activity: Not on file

## 2018-07-28 ENCOUNTER — Encounter: Payer: Self-pay | Admitting: Hematology and Oncology

## 2018-07-31 IMAGING — CR DG CHEST 2V
2 series · 2 of 2 positions shown · non-contrast
Comparison: 10/11/2015

CLINICAL DATA: Mid sternal chest pain with nausea and vomiting
beginning this morning, history hypertension

EXAM:
CHEST  2 VIEW

[chest pa]
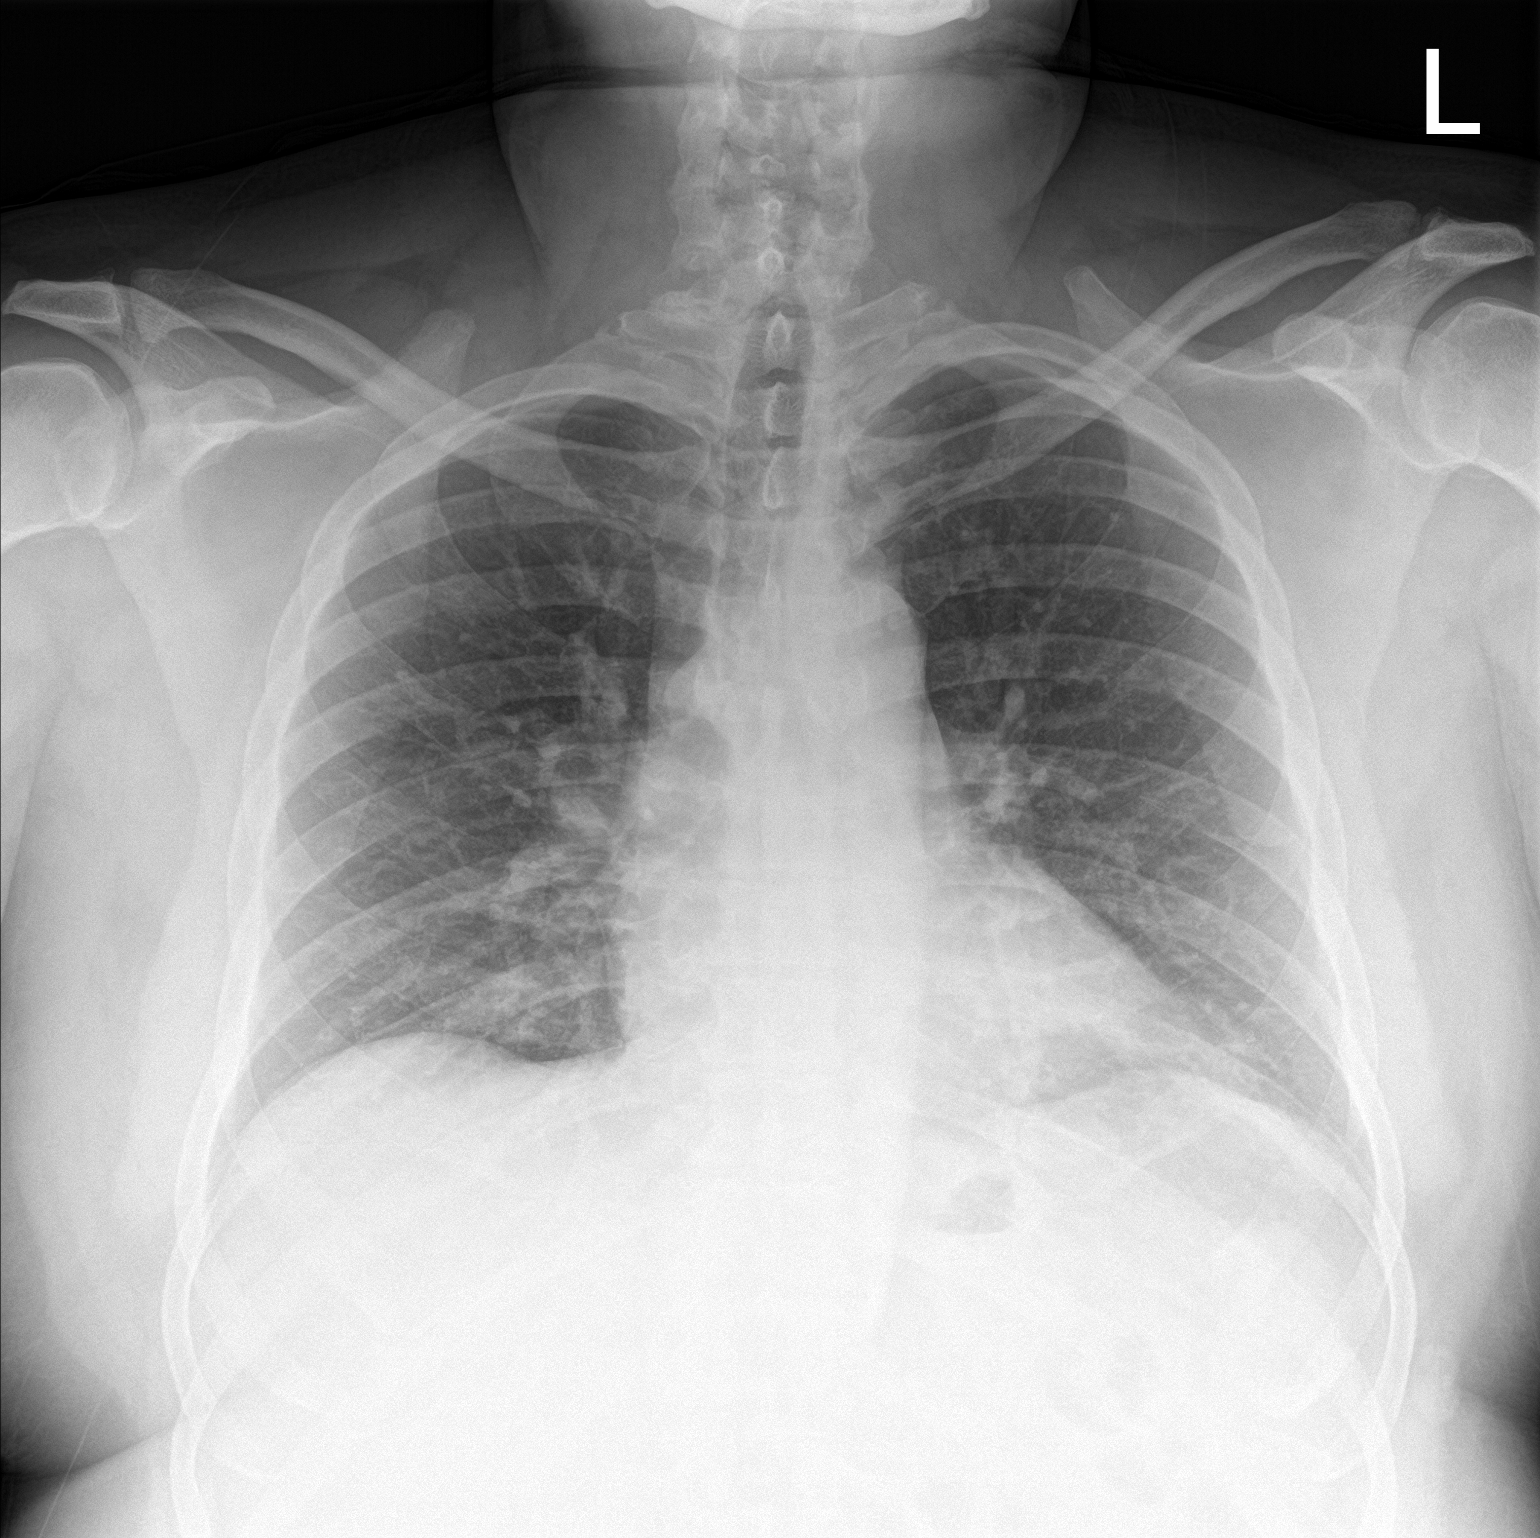

[chest lat]
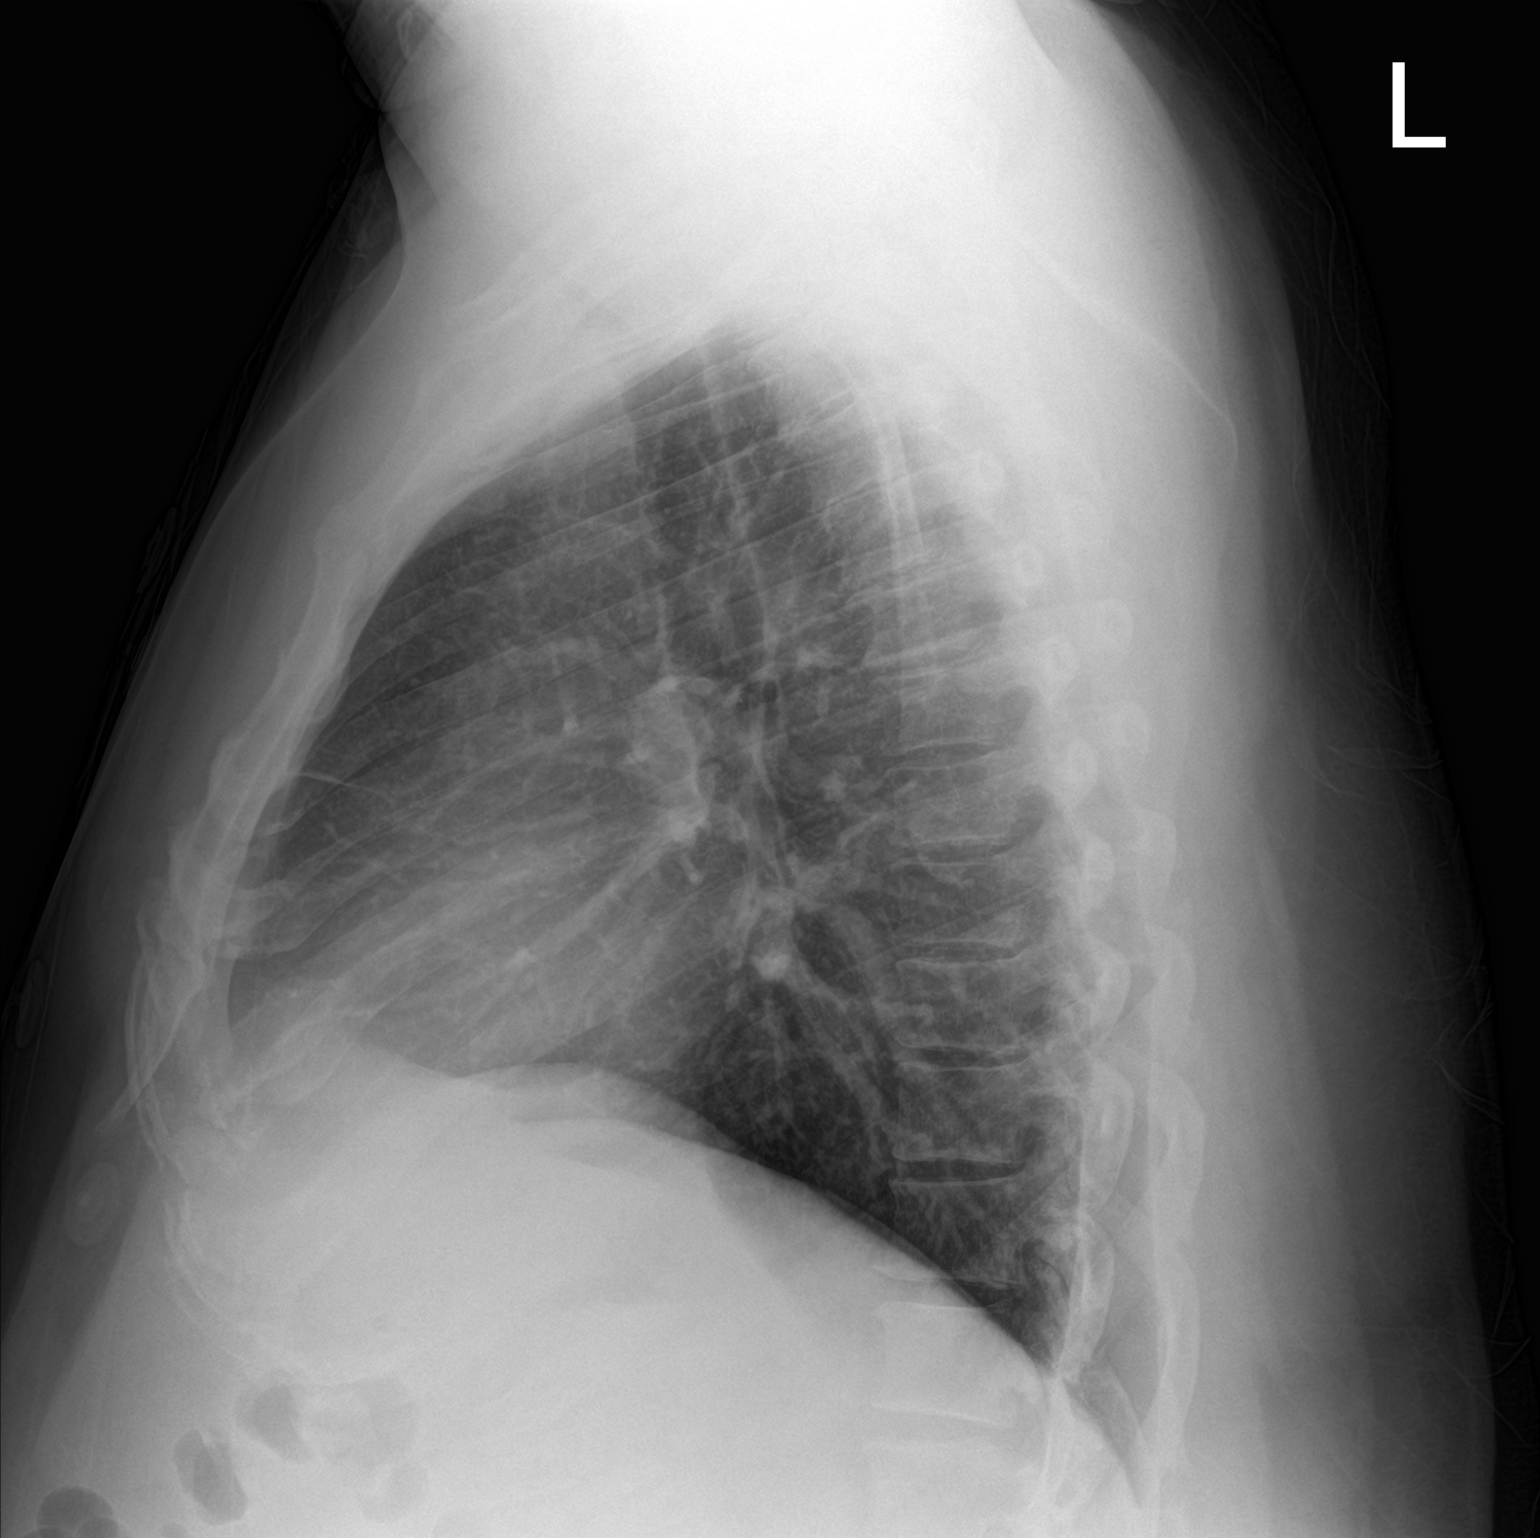

[2 of 2 positions shown; findings below may reference images not displayed]

FINDINGS: Upper-normal size of cardiac silhouette.

Mediastinal contours and pulmonary vascularity normal.

Lungs clear.

No pleural effusion or pneumothorax.

Bones unremarkable.
IMPRESSION: No acute abnormalities.

## 2018-08-06 ENCOUNTER — Inpatient Hospital Stay: Payer: Self-pay | Attending: Hematology and Oncology | Admitting: Hematology and Oncology

## 2018-08-06 ENCOUNTER — Telehealth: Payer: Self-pay | Admitting: Hematology and Oncology

## 2018-08-06 VITALS — BP 128/94 | HR 82 | Temp 98.6°F | Resp 19 | Ht 69.0 in | Wt 282.4 lb

## 2018-08-06 DIAGNOSIS — M79661 Pain in right lower leg: Secondary | ICD-10-CM | POA: Insufficient documentation

## 2018-08-06 DIAGNOSIS — R42 Dizziness and giddiness: Secondary | ICD-10-CM | POA: Insufficient documentation

## 2018-08-06 DIAGNOSIS — K219 Gastro-esophageal reflux disease without esophagitis: Secondary | ICD-10-CM | POA: Insufficient documentation

## 2018-08-06 DIAGNOSIS — N400 Enlarged prostate without lower urinary tract symptoms: Secondary | ICD-10-CM | POA: Insufficient documentation

## 2018-08-06 DIAGNOSIS — Z79899 Other long term (current) drug therapy: Secondary | ICD-10-CM | POA: Insufficient documentation

## 2018-08-06 DIAGNOSIS — E119 Type 2 diabetes mellitus without complications: Secondary | ICD-10-CM | POA: Insufficient documentation

## 2018-08-06 DIAGNOSIS — D573 Sickle-cell trait: Secondary | ICD-10-CM | POA: Insufficient documentation

## 2018-08-06 DIAGNOSIS — I10 Essential (primary) hypertension: Secondary | ICD-10-CM | POA: Insufficient documentation

## 2018-08-06 DIAGNOSIS — G4730 Sleep apnea, unspecified: Secondary | ICD-10-CM | POA: Insufficient documentation

## 2018-08-06 DIAGNOSIS — Z7984 Long term (current) use of oral hypoglycemic drugs: Secondary | ICD-10-CM | POA: Insufficient documentation

## 2018-08-06 DIAGNOSIS — M1990 Unspecified osteoarthritis, unspecified site: Secondary | ICD-10-CM | POA: Insufficient documentation

## 2018-08-06 DIAGNOSIS — I82403 Acute embolism and thrombosis of unspecified deep veins of lower extremity, bilateral: Secondary | ICD-10-CM

## 2018-08-06 DIAGNOSIS — F329 Major depressive disorder, single episode, unspecified: Secondary | ICD-10-CM | POA: Insufficient documentation

## 2018-08-06 DIAGNOSIS — Z7901 Long term (current) use of anticoagulants: Secondary | ICD-10-CM | POA: Insufficient documentation

## 2018-08-06 DIAGNOSIS — E785 Hyperlipidemia, unspecified: Secondary | ICD-10-CM | POA: Insufficient documentation

## 2018-08-06 DIAGNOSIS — Z86718 Personal history of other venous thrombosis and embolism: Secondary | ICD-10-CM | POA: Insufficient documentation

## 2018-08-06 NOTE — Telephone Encounter (Signed)
Gave pt avs and calendar  °

## 2018-08-06 NOTE — Patient Instructions (Addendum)
We discussed in detail your history of deep vein thrombosis within the right and left lower extremity.  It was recommended that rivaroxaban be replaced with warfarin.  Through Dr. Baxter FlatteryNewlin's office, you will be switched from rivaroxaban to warfarin.  Generally monitoring of the "warfarin level" is managed by the "warfarin clinic."  In an effort to manage the pain in the lower extremities, and exercise program through physical therapy should be considered.  Compression therapy is the cornerstone of managing chronic venous disease including post thrombotic syndrome.  Intermittent pneumatic compression can help to reduce intractable swelling.  For some individuals compression stockings are helpful.  They do not prevent clot formation, but they do improve lower extremity circulation and decreased swelling.  Finally endovascular surgical intervention in a select population may improve the quality of life in an individual with chronic venous disease.  I would consider a vascular surgical consultation.  Barring any unforeseen complications, a follow-up visit has been scheduled for January 8.  Please do not hesitate to call if any new or untoward problems arise in the interim.  Thank you! Happy Thanksgiving!

## 2018-08-07 ENCOUNTER — Telehealth: Payer: Self-pay | Admitting: Family Medicine

## 2018-08-07 NOTE — Progress Notes (Signed)
Outpatient Hematology/Oncology Initial Consultation  Patient Name:  David Dunlap  DOB: 1971/06/03   Date of Service: August 06, 2018  Referring Provider: Charlott Rakes MD [address] [place], [place] [address]   Consulting Physician: Henreitta Leber, MD Hematology/Oncology  Reason for Referral: In the setting of recurrence lower extremity deep vein thromboses; initially starting in 2015; currently on rivaroxaban, David Dunlap presents now for further discussion regarding both continuing anticoagulation, and if recommended, considering switching to different anticoagulant.  History Present Illness: David Dunlap is a 47 year old resident of [place] whose past medical history is significant for non-insulin-requiring diabetes mellitus; primary hypertension; dyslipidemia; erectile dysfunction; chronic depression; sleep apnea compliant with CPAP; sickle cell trait; allergic rhinitis; and degenerative joint disease involving his knees bilaterally.  David Dunlap has a history of venous thromboembolic disease dating back to 2015.  There is a vague history of "blood clots" in 1 of his sisters.  David Dunlap is accompanied by his friend David Dunlap at this first visit.   On March 18, 2014 David Dunlap presented to the emergency department at [facility] complaining of a 3-week history of right lower leg pain.  The pain started after David Dunlap injured his right leg when stepping out of a truck 3 weeks ago.  David Dunlap initially went to urgent care and was told David Dunlap had a "muscle strain of his calf."  David Dunlap had no chest pain or dyspnea.  A duplex venous Doppler was performed which revealed an acute deep vein thrombosis involving the right femoral, popliteal, peroneal, and posterior tibial veins.  David Dunlap was started on anticoagulation with rivaroxaban.  On May 28, 2014 David Dunlap presented once again to the emergency department at [facility] complaining of worsening right lower extremity pain.  According to the record, David Dunlap started rivaroxaban but did not  continue it for a period of at least 3 weeks.  A duplex venous Doppler of the right lower extremity was again performed.  At that time there was evidence of a subacute deep vein thrombosis involving the right femoral, right popliteal, and right peroneal veins.  For the outpatient [facility] pharmacy, rivaroxaban was made available.  An appointment was scheduled with cardiology (Dr. Verl Blalock) on June 09, 2014.  Apparently samples were provided on an interim basis.  On November 16, 2014 David Dunlap presented again to the emergency department at [facility].  At this time David Dunlap was complaining of worsening bilateral lower extremity swelling and pain from his knees to his ankles.  David Dunlap has been taking ibuprofen for pain relief.  "David Dunlap states David Dunlap is currently out of his prescribed anticoagulant, but was unable to afford to get the prescription refilled."  His last dose was apparently 2 months earlier.  A bilateral lower extremity duplex venous Doppler was performed on November 16, 2013.  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  There was on the left and acute deep vein thrombosis involving the left posterior tibial vein.  At that time David Dunlap met with social service and was given information regarding "an orange card," walk-in times and medication to receive once David Dunlap completed the required paperwork.  At that time David Dunlap verbalized understanding and follow-up instructions.  On December 17, 2014 David Dunlap presented to the emergency department after stepping out his front door and missing the first step causing his ankle to roll.  David Dunlap felt a popping sensation immediately tenderness and swelling began.  Although David Dunlap took Percocet for pain relief, the pain and swelling persisted to the time of  presentation.  An x-ray revealed diffuse soft tissue swelling without evidence of fracture dislocation or ankle joint effusion.  On October 11, 2015 David Dunlap presented to the emergency department again complaining of productive  cough with green-yellow sputum.  Those findings were felt to be consistent with bronchitis since David Dunlap had at that time a leukocytosis.  David Dunlap was treated with azithromycin.  At that time David Dunlap had no dyspnea and appeared to be compliant with rivaroxaban.  Several days later on October 28, 2015 David Dunlap presented again with left leg pain in the emergency department at [facility].  The pain is worse with activity and walking.  David Dunlap also noticed some increased swelling of the left leg.  A left lower extremity duplex venous Doppler was performed.  Those findings were consistent with a nonobstructing chronic appearing thrombus involving the left peroneal vein.  There was resolution of the prior deep vein thrombosis in the left posterior tibial veins.  Left plantar fasciitis was suspected and David Dunlap was started on prednisone.  Ice treatment and massage was recommended also.  On April 28, 2018 David Dunlap was admitted with localized osteoarthritis right is of the left knee associated with severe unremitting pain that affected his sleep, daily activities, and work.  A total left knee arthroplasty was performed by Dr. Frankey Shown, orthopedic surgery.  David Dunlap apparently obtained considerable relief following his surgery.  David Dunlap was discharged on April 30, 2018.   On May 14, 2018 David Dunlap presented to the emergency department at [facility], complaining of pain in both knees.  There was a slight amount of swelling in the right compared with the left.  There are chronic appearing skin changes in the right lower leg consistent with venous stasis.  His pulses were intact bilaterally.  There is normal plantar/dorsiflexion.  There was no evidence of cellulitis or septic joint.  David Dunlap reportedly had not completed all of his physical therapy and there was some evidence of muscle atrophy. Outpatient duplex Dopplers were recommended and performed the following day.  Results of that study from July 15, 2018 revealed no evidence of deep vein thrombosis in the right lower  extremity.  No cystic structure was found in the popliteal fossa.  On the left findings were consistent with an acute deep vein thrombosis involving the left gastrocnemius vein.  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  No cystic structure was found in the popliteal fossa.  David Dunlap denies any appetite or weight deficit.  David Dunlap has positional dizziness.  David Dunlap has no unusual headache, lightheadedness, syncope, or near syncopal episodes.  David Dunlap denies rash or itching.  David Dunlap has no visual changes or hearing deficit. There is no cough, sore throat, orthopnea.  David Dunlap denies any dyspnea at rest.  David Dunlap is chronic but stable exertional dyspnea. There is no pain or difficulty in swallowing.  No fever, shaking chills, sweats, or flulike symptoms are evident. David Dunlap has no heartburn or indigestion.  David Dunlap denies nausea, vomiting, diarrhea, or constipation.  David Dunlap has no renal or hepatic disease.  David Dunlap has no viral hepatitis, inflammatory bowel disease, or symptomatic diverticulosis.  David Dunlap is never had a screening colonoscopy.  No urinary frequency, urgency, hematuria, or dysuria are reported. David Dunlap has chronic swelling of his ankles.  David Dunlap has bilateral pain in his knees and legs.  David Dunlap denies any bleeding tendency.  David Dunlap has known sickle cell trait. It is with this background David Dunlap presents now for further discussion and recommendations in the setting of  recurrent lower extremity deep vein thrombosis complicated by noncompliance and elevated BMI.  Past Medical History:  Diagnosis Date  . Arthritis   . Depression   . Diabetes mellitus without complication ([place]) 63/0160   NEW ONSET    WITH FAMILY HISTORY  . DVT (deep venous thrombosis) ([place])   . Essential hypertension   . Gallstones 05/2016  . GERD (gastroesophageal reflux disease)   . Sickle cell anemia (H[facility])    Sickle trait  . Sleep apnea    Cpap   Past Surgical History:  Procedure Laterality Date  . CHOLECYSTECTOMY N/A 06/07/2016    Procedure: LAPAROSCOPIC CHOLECYSTECTOMY WITH ATTEMPTED INTRAOPERATIVE CHOLANGIOGRAM;  Surgeon: Georganna Skeans, MD;  Location: [place];  Service: General;  Laterality: N/A;  . LIGAMENT REPAIR     R forearm  . TOTAL KNEE ARTHROPLASTY Right 04/17/2017   Procedure: RIGHT TOTAL KNEE ARTHROPLASTY;  Surgeon: Leandrew Koyanagi, MD;  Location: [place];  Service: Orthopedics;  Laterality: Right;  . TOTAL KNEE ARTHROPLASTY Left 04/28/2018   Procedure: LEFT TOTAL KNEE ARTHROPLASTY;  Surgeon: Leandrew Koyanagi, MD;  Location: [place];  Service: Orthopedics;  Laterality: Left;   Family History  Problem Relation Age of Onset  . Diabetes Mother   . Diabetes Father   . Hypertension Sister   Mother: Age 31 years: DM/HTN Father: Age 14: HTN/DM/MI David Dunlap has no brothers Sisters (2) DM/"blood clots"  Social History   Socioeconomic History  . Marital status: Single    Spouse name: Not on file  . Number of children: Not on file  . Years of education: Not on file  . Highest education level: Not on file  Occupational History  . Not on file  Social Needs  . Financial resource strain: Not on file  . Food insecurity:    Worry: Not on file    Inability: Not on file  . Transportation needs:    Medical: Not on file    Non-medical: Not on file  Tobacco Use  . Smoking status: Never Smoker  . Smokeless tobacco: Never Used  Substance and Sexual Activity  . Alcohol use: Yes    Alcohol/week: 1.0 - 2.0 standard drinks    Types: 1 - 2 Cans of beer per week    Comment: doesn't drink all the drink  . Drug use: No    Comment: last time 1989  . Sexual activity: Not on file  Lifestyle  . Physical activity:    Days per week: Not on file    Minutes per session: Not on file  . Stress: Not on file  Relationships  . Social connections:    Talks on phone: Not on file    Gets together: Not on file    Attends religious service: Not on file    Active member of club or organization: Not on file    Attends meetings of clubs or  organizations: Not on file    Relationship status: Not on file  . Intimate partner violence:    Fear of current or ex partner: Not on file    Emotionally abused: Not on file    Physically abused: Not on file    Forced sexual activity: Not on file  Other Topics Concern  . Not on file  Social History Narrative  . Not on file  David Dunlap is single David Dunlap has 5 children At least 1 of his children has sickle trait David Dunlap is a lifetime non-smoker David Dunlap has no significant use of pipe, cigars, or  chewing tobacco His alcohol intake is infrequent, never heavy David Dunlap reports no current recreational drug use  Transfusion History: No prior transfusion  Exposure History: David Dunlap has no known exposure to toxic chemicals, radiation, or pesticides  Allergies:  David Dunlap has no known medical allergies David Dunlap has no food allergies David Dunlap has nonspecific seasonal allergies  Current Outpatient Medications on File Prior to Visit  Medication Sig  . atorvastatin (LIPITOR) 20 MG tablet Take 1 tablet (20 mg total) by mouth daily.  . Blood Glucose Monitoring Suppl (TRUE METRIX METER) DEVI 1 each by Does not apply route 3 (three) times daily before meals.  . cetirizine (ZYRTEC) 10 MG tablet TAKE 1 TABLET BY MOUTH DAILY. (Patient taking differently: Take 10 mg by mouth daily. )  . dicyclomine (BENTYL) 20 MG tablet Take 1 tablet (20 mg total) by mouth 2 (two) times daily.  Marland Kitchen FLUoxetine (PROZAC) 20 MG tablet Take 2 tablets (40 mg total) by mouth daily.  Marland Kitchen gabapentin (NEURONTIN) 300 MG capsule Take 1 capsule (300 mg total) by mouth 3 (three) times daily.  Marland Kitchen glipiZIDE (GLUCOTROL) 5 MG tablet Take 1 tablet (5 mg total) by mouth 2 (two) times daily before a meal.  . glucose blood (TRUE METRIX BLOOD GLUCOSE TEST) test strip Used daily before meals  . hydrochlorothiazide (HYDRODIURIL) 25 MG tablet Take 1 tablet (25 mg total) by mouth daily.  Marland Kitchen HYDROcodone-acetaminophen (NORCO) 5-325 MG tablet Take 1 tablet by mouth 2 (two) times daily as  needed for moderate pain.  Marland Kitchen lidocaine (LIDODERM) 5 % Place 1 patch onto the skin daily for 30 doses. Remove & Discard patch within 12 hours or as directed by MD (Patient not taking: Reported on 07/17/2018)  . lisinopril (PRINIVIL,ZESTRIL) 10 MG tablet Take 1 tablet (10 mg total) by mouth daily.  . metFORMIN (GLUCOPHAGE) 500 MG tablet Take 2 tablets (1,000 mg total) by mouth 2 (two) times daily with a meal. (Patient taking differently: Take 500 mg by mouth 2 (two) times daily with a meal. )  . methocarbamol (ROBAXIN) 750 MG tablet Take 1 tablet (750 mg total) by mouth 2 (two) times daily as needed for muscle spasms. (Patient not taking: Reported on 07/17/2018)  . ondansetron (ZOFRAN) 4 MG tablet Take 1 tablet (4 mg total) by mouth every 6 (six) hours. (Patient not taking: Reported on 07/14/2018)  . promethazine (PHENERGAN) 25 MG tablet Take 1 tablet (25 mg total) by mouth every 6 (six) hours as needed for nausea. (Patient not taking: Reported on 06/18/2018)  . rivaroxaban (XARELTO) 20 MG TABS tablet Take 1 tablet (20 mg total) by mouth daily. (Patient taking differently: Take 20 mg by mouth daily with lunch. )  . sildenafil (VIAGRA) 50 MG tablet Take 50 mg by mouth daily as needed for erectile dysfunction (at least 24 hours between doses).  . traMADol (ULTRAM) 50 MG tablet 1-2 pills every 6 hours as needed for pain  . TRUEPLUS LANCETS 28G MISC Use daily before meals   No current facility-administered medications on file prior to visit.     Review of Systems: Constitutional: No fever, sweats, or shaking chills.  No appetite or weight deficit. Skin: No rash, scaling, sores, lumps, or jaundice. HEENT: No visual changes or hearing deficit; allergic rhinitis Pulmonary: No unusual cough, sore throat, or orthopnea; sleep apnea compliant with CPAP Cardiovascular: No coronary artery disease, angina, or myocardial infarction.  No cardiac dysrhythmia; primary hypertension and dyslipidemia. Gastrointestinal:  No indigestion, dysphagia, abdominal pain, diarrhea, or constipation.  No change  in bowel habits. Genitourinary: No urinary frequency, urgency, hematuria, or dysuria; erectile dysfunction Musculoskeletal: No new arthralgias or myalgias; no joint swelling or instability; degenerative joint disease involving the knees. Hematologic: No bleeding tendency or easy bruisability; sickle cell trait Endocrine: No intolerance to heat or cold; no thyroid disease or diabetes mellitus. Vascular: No peripheral arterial or venous thromboembolic disease. Psychological: Chronic depression; no mood changes. Neurological: No dizziness, lightheadedness, syncope, or near syncopal episodes; no numbness or tingling in the fingers or toes.  Physical Examination: Vital Signs: Body surface area is 2.5 meters squared.  Vitals:   08/06/18 1314  BP: (!) 128/94  Pulse: 82  Resp: 19  Temp: 98.6 F (37 C)  SpO2: 96%    Filed Weights   08/06/18 1314  Weight: 282 lb 6.4 oz (128.1 kg)   ECOG PERFORMANCE STATUS: 1 Constitutional:  David Dunlap is a fully nourished and developed African-American.  David Dunlap looks age appropriate.  David Dunlap is friendly and cooperative without respiratory compromise at rest. Skin: No rashes, scaling, dryness, jaundice, or itching. HEENT: Head is normocephalic and atraumatic.  Pupils are equal round and reactive to light and accommodation.  Sclerae are anicteric.  Conjunctivae are pink.  No sinus tenderness nor oropharyngeal lesions.  Lips without cracking or peeling; tongue without mass, inflammation, or nodularity.  Mucous membranes are moist. Neck: Supple and symmetric.  No jugular venous distention or thyromegaly.  Trachea is midline. Lymphatics: No cervical or supraclavicular lymphadenopathy.  No epitrochlear, axillary, or inguinal lymphadenopathy is appreciated. Respiratory/chest: Thorax is symmetrical.  Breath sounds are clear to auscultation and percussion.  Normal excursion and respiratory  effort. Back: Symmetric without deformity or tenderness. Cardiovascular: Heart rate and rhythm are regular. Gastrointestinal: Abdomen is soft, nontender; no organomegaly.  Bowel sounds are normoactive.  No masses are appreciated. Extremities: In the lower extremities, there is asymmetric swelling of the left calf; no erythema, or cord formation.  No clubbing or cyanosis.  There is 1+ bipedal edema extending to the mid left ankle. There is tenderness to palpation of the left knee and lateral calf. Hematologic: No petechiae, hematomas, or ecchymoses. Psychological:  David Dunlap is oriented to person, place, and time; normal affect. Neurological: Examination was limited.  Both motor and sensation intact. Gait was not assessed.   Laboratory Results: I have reviewed the data as listed: CBC Latest Ref Rng & Units 07/15/2018 07/14/2018 04/29/2018  WBC 4.0 - 10.5 K/uL 12.1(H) - 16.0(H)  Hemoglobin 13.0 - 17.0 g/dL 15.2 14.6 14.2  Hematocrit 39.0 - 52.0 % 44.3 43.0 40.5  Platelets 150 - 400 K/uL 208 - 162    CMP Latest Ref Rng & Units 07/15/2018 07/14/2018 04/29/2018  Glucose 70 - 99 mg/dL 166(H) 131(H) 123(H)  BUN 6 - 20 mg/dL 6 7 15   Creatinine 0.61 - 1.24 mg/dL 0.93 1.00 1.12  Sodium 135 - 145 mmol/L 138 137 135  Potassium 3.5 - 5.1 mmol/L 3.9 3.8 4.2  Chloride 98 - 111 mmol/L 101 99 102  CO2 22 - 32 mmol/L 26 - 23  Calcium 8.9 - 10.3 mg/dL 9.2 - 8.3(L)  Total Protein 6.5 - 8.1 g/dL - - -  Total Bilirubin 0.3 - 1.2 mg/dL - - -  Alkaline Phos 38 - 126 U/L - - -  AST 15 - 41 U/L - - -  ALT 0 - 44 U/L - - -   Diagnostic/Imaging Studies: July 15, 2018 Right: There is no evidence of deep vein thrombosis in the lower extremity. No cystic structure found  in the popliteal fossa. Left: Findings consistent with acute deep vein thrombosis involving the left gastrocnemius vein. 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 No cystic structure found in the popliteal  fossa.  David Barban MD on 07/15/2018   October 28, 2015 Findings consistent with non-obstructing chronic appearing thrombosis involving the left peroneal veins. There is resolution of prior deep vein thrombosis in the left posterior tibial veins.  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 Findings consistent with acute deep vein thrombosis involving the left posterial tibial vein.  David Dunlap  May 28, 2014 Findings consistent with subacute deep vein thrombosis involving the right femoral vein, right popliteal vein, and right peroneal vein.  David Barthel, MD  March 18, 2014 Findings consistent with acute deep vein thrombosis involving the femoral, popliteal, peroneal, and posterior tibial veins of the right lower extremity.  David Barthel, MD  Summary/Assessment: In the setting of recurrence lower extremity deep vein thromboses; initially starting in 2015; currently on rivaroxaban, David Dunlap presents now for further discussion regarding both continuing anticoagulation, and if recommended, considering switching to different anticoagulant.  On March 18, 2014 David Dunlap presented to the emergency department at [facility] complaining of a 3-week history of right lower leg pain.  The pain started after David Dunlap injured his right leg when stepping out of a truck 3 weeks ago.  David Dunlap initially went to urgent care and was told David Dunlap had a "muscle strain of his calf."  David Dunlap had no chest pain or dyspnea.  A duplex venous Doppler was performed which revealed an acute deep vein thrombosis involving the right femoral, popliteal, peroneal, and posterior tibial veins.  David Dunlap was started on anticoagulation with rivaroxaban.  On May 28, 2014 David Dunlap presented once again to the emergency department at [facility] complaining of worsening right lower extremity pain.  According to the record, David Dunlap started rivaroxaban but did not continue it for a  period of at least 3 weeks.  A duplex venous Doppler of the right lower extremity was again performed.  At that time there was evidence of a subacute deep vein thrombosis involving the right femoral, right popliteal, and right peroneal veins.  For the outpatient [facility] pharmacy, rivaroxaban was made available.  An appointment was scheduled with cardiology (Dr. Verl Blalock) on June 09, 2014.  Apparently samples were provided on an interim basis.  On November 16, 2014 David Dunlap presented again to the emergency department at [facility].  At this time David Dunlap was complaining of worsening bilateral lower extremity swelling and pain from his knees to his ankles.  David Dunlap has been taking ibuprofen for pain relief.  "David Dunlap states David Dunlap is currently out of his prescribed anticoagulant, but was unable to afford to get the prescription refilled."  His last dose was apparently 2 months earlier.  A bilateral lower extremity duplex venous Doppler was performed on November 16, 2013.  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  There was on the left and acute deep vein thrombosis involving the left posterior tibial vein.  At that time David Dunlap met with social service and was given information regarding "an orange card," walk-in times and medication to receive once David Dunlap completed the required paperwork.  At that time David Dunlap verbalized understanding and follow-up instructions.  On December 17, 2014 David Dunlap presented to the emergency department after stepping out his front door and missing the  first step causing his ankle to roll.  David Dunlap felt a popping sensation immediately tenderness and swelling began.  Although David Dunlap took Percocet for pain relief, the pain and swelling persisted to the time of presentation.  An x-ray revealed diffuse soft tissue swelling without evidence of fracture dislocation or ankle joint effusion.  On October 11, 2015 David Dunlap presented to the emergency department again complaining of productive cough with  green-yellow sputum.  Those findings were felt to be consistent with bronchitis since David Dunlap had at that time a leukocytosis.  David Dunlap was treated with azithromycin.  At that time David Dunlap had no dyspnea and appeared to be compliant with rivaroxaban.  Several days later on October 28, 2015 David Dunlap presented again with left leg pain in the emergency department at [facility].  The pain is worse with activity and walking.  David Dunlap also noticed some increased swelling of the left leg.  A left lower extremity duplex venous Doppler was performed.  Those findings were consistent with a nonobstructing chronic appearing thrombus involving the left peroneal vein.  There was resolution of the prior deep vein thrombosis in the left posterior tibial veins.  Left plantar fasciitis was suspected and David Dunlap was started on prednisone.  Ice treatment and massage was recommended also.  On April 28, 2018 David Dunlap was admitted with localized osteoarthritis right is of the left knee associated with severe unremitting pain that affected his sleep, daily activities, and work.  A total left knee arthroplasty was performed by Dr. Frankey Shown, orthopedic surgery.  David Dunlap apparently obtained considerable relief following his surgery.  David Dunlap was discharged on April 30, 2018.   On May 14, 2018 David Dunlap presented to the emergency department at [facility], complaining of pain in both knees.  There was a slight amount of swelling in the right compared with the left.  There are chronic appearing skin changes in the right lower leg consistent with venous stasis.  His pulses were intact bilaterally.  There is normal plantar/dorsiflexion.  There was no evidence of cellulitis or septic joint.  David Dunlap reportedly had not completed all of his physical therapy and there was some evidence of muscle atrophy. Outpatient duplex Dopplers were recommended and performed the following day.  Results of that study from July 15, 2018 revealed no evidence of deep vein thrombosis in the right lower extremity.   No cystic structure was found in the popliteal fossa.  On the left findings were consistent with an acute deep vein thrombosis involving the left gastrocnemius vein.  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  No cystic structure was found in the popliteal fossa.  David Dunlap denies any appetite or weight deficit.  David Dunlap has positional dizziness.  David Dunlap has no unusual headache, lightheadedness, syncope, or near syncopal episodes.  David Dunlap denies rash or itching.  David Dunlap has no visual changes or hearing deficit.  There is no cough, sore throat, orthopnea.  David Dunlap denies any dyspnea at rest.  David Dunlap is chronic but stable exertional dyspnea.  There is no pain or difficulty in swallowing.  No fever, shaking chills, sweats, or flulike symptoms are evident.  David Dunlap has no heartburn or indigestion.  David Dunlap denies nausea, vomiting, diarrhea, or constipation.  David Dunlap has no renal or hepatic disease.  David Dunlap has no viral hepatitis, inflammatory bowel disease, or symptomatic diverticulosis.  David Dunlap is never had a screening colonoscopy.  No urinary frequency, urgency, hematuria, or dysuria are reported.  David Dunlap has chronic swelling of his ankles.  David Dunlap has bilateral pain in his knees and legs.  David Dunlap denies any bleeding tendency.  David Dunlap has known sickle cell trait.   His other comorbid problems include non-insulin-requiring diabetes mellitus; elevated BMI; primary hypertension; dyslipidemia; erectile dysfunction; chronic depression; sleep apnea compliant with CPAP; sickle cell trait; allergic rhinitis; and degenerative joint disease involving his knees bilaterally.  David Dunlap has a history of venous thromboembolic disease dating back to 2015.  There is a vague history of "blood clots" in 1 of his sisters.   Recommendation/Plan: We discussed in detail his prior history with bilateral lower extremity deep vein thrombosis. With evidence of acute and subacute venous thromboembolic events, complicated by noncompliance and elevated  BMI, discontinuing rivaroxaban in favor of warfarin is recommended.  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  In patients with a BMI >40 kg/m2 or weight >120 kg, the International Society on Thrombosis and Haemostasis,  2016 guideline suggests avoiding the use of rivaroxaban (and other direct oral anticoagulants [DOACs]) due to the lack of clinical data in this population ( [Martin 2016]). A 2017 review concluded, based on pharmacokinetic data, that for individuals with a BMI >40 kg/m2 (or weight >120 kg), rivaroxaban may be used and dosage adjustment is not necessary; however, studies were not powered to address outcomes (Moore 2017).  Because David Dunlap appears to have failed rivaroxaban, it is justifiable to switch to warfarin. The more expensive and more cumbersome enoxaparin is another long-term alternative. Switching to another direct oral anticoagulant after failure with rivaroxaban is not advisable.  The process of transitioning from rivaroxaban to warfarin should be done through the "[facility]" under the direction of Dr. Margarita Rana. In transitioning from rivaroxaban to warfarin, initially stop rivaroxaban and start warfarin the same day. Consider bridging with parenteral low molecular weight heparin, starting the next day until the desired INR is reached. Enoxaparin: 1 mg/kg twice daily is preferred.    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 Therefore a hypercoagulable evaluation was not recommended at this time.  Other supportive measures in the setting of post thrombotic syndrome include exercise such as ankle flexion and walking in an effort to increase calf muscle strength and leg flexibility.  For years compression therapy has been the cornerstone of managing chronic venous disease.,  The commonly used forms of compression include compression stockings, short stretch  bandages, or multilayer compression.  The choice of compression depends on the severity of the symptoms and is generally prescribed through physical therapy.    Although they do not prevent venous thromboembolism, support stockings can reduce swelling and improve lower extremity circulation.  A vascular surgical consultation is also an option since infrequently endovascular or surgical interventions in select patients with venous obstruction or reflux may decrease the incidence of recurrent skin changes and improve the quality of life with chronic venous disease.   Dry skin itching and eczematous changes are treated with moisturizers or sometimes a topical corticosteroid.  A follow-up visit was recommended on September 24, 2018 to reassess his response to treatment.  David Dunlap was advised to call us in the interim should any new or untoward problems arise.  The total time spent discussing his prior history, rationale behind switching from rivaroxaban to warfarin, preliminary considerations including supportive measures and recommendations was 60 minutes.  At least 50% of that time was spent in face-to-face discussion, counseling, and answering questions. All questions were answered to his satisfaction.   This note  was dictated using voice activated technology/software.  Unfortunately, typographical errors are not uncommon, and transcription is subject to mistakes and regrettably misinterpretation.  If necessary, clarification of the above information can be discussed with me at any time.  Thank you Dr. Margarita Rana for allowing my participation in the care of David Dunlap. Please do not hesitate to call should any questions arise regarding this initial consultation and discussion.  FOLLOW UP: AS DIRECTED   cc:       Charlott Rakes MD       Henreitta Leber, MD  Hematology/Oncology [facility] [address] [place], [place] [address] Office: [phone] QBVQ: [phone]

## 2018-08-07 NOTE — Telephone Encounter (Signed)
Patient called because David Dunlap at the [facility] changed the xarelto to warfarin. Patient would like to discuss other things. Please follow up with patient.

## 2018-08-08 NOTE — Telephone Encounter (Signed)
Patient was called and there was no answer. 

## 2018-08-11 ENCOUNTER — Telehealth: Payer: Self-pay

## 2018-08-11 ENCOUNTER — Encounter: Payer: Self-pay | Admitting: Hematology and Oncology

## 2018-08-11 NOTE — Telephone Encounter (Signed)
Per Dr. Caron Presumeuben, office note from 08/06/18 faxed to Hoy RegisterEnobong Newlin, MD. Confirmation of fax received.

## 2018-08-12 ENCOUNTER — Encounter: Payer: Self-pay | Admitting: Family Medicine

## 2018-08-12 ENCOUNTER — Ambulatory Visit: Payer: Self-pay | Attending: Family Medicine | Admitting: Family Medicine

## 2018-08-12 VITALS — BP 131/91 | HR 90 | Temp 98.7°F | Resp 16 | Wt 281.6 lb

## 2018-08-12 DIAGNOSIS — I82509 Chronic embolism and thrombosis of unspecified deep veins of unspecified lower extremity: Secondary | ICD-10-CM

## 2018-08-12 DIAGNOSIS — E669 Obesity, unspecified: Secondary | ICD-10-CM

## 2018-08-12 DIAGNOSIS — Z86718 Personal history of other venous thrombosis and embolism: Secondary | ICD-10-CM | POA: Insufficient documentation

## 2018-08-12 DIAGNOSIS — Z96652 Presence of left artificial knee joint: Secondary | ICD-10-CM

## 2018-08-12 DIAGNOSIS — M1990 Unspecified osteoarthritis, unspecified site: Secondary | ICD-10-CM | POA: Insufficient documentation

## 2018-08-12 DIAGNOSIS — E1169 Type 2 diabetes mellitus with other specified complication: Secondary | ICD-10-CM

## 2018-08-12 DIAGNOSIS — D571 Sickle-cell disease without crisis: Secondary | ICD-10-CM | POA: Insufficient documentation

## 2018-08-12 DIAGNOSIS — Z7901 Long term (current) use of anticoagulants: Secondary | ICD-10-CM | POA: Insufficient documentation

## 2018-08-12 DIAGNOSIS — G4733 Obstructive sleep apnea (adult) (pediatric): Secondary | ICD-10-CM | POA: Insufficient documentation

## 2018-08-12 DIAGNOSIS — G629 Polyneuropathy, unspecified: Secondary | ICD-10-CM

## 2018-08-12 DIAGNOSIS — I10 Essential (primary) hypertension: Secondary | ICD-10-CM | POA: Insufficient documentation

## 2018-08-12 DIAGNOSIS — Z86711 Personal history of pulmonary embolism: Secondary | ICD-10-CM | POA: Insufficient documentation

## 2018-08-12 DIAGNOSIS — F329 Major depressive disorder, single episode, unspecified: Secondary | ICD-10-CM | POA: Insufficient documentation

## 2018-08-12 DIAGNOSIS — E1140 Type 2 diabetes mellitus with diabetic neuropathy, unspecified: Secondary | ICD-10-CM | POA: Insufficient documentation

## 2018-08-12 DIAGNOSIS — K219 Gastro-esophageal reflux disease without esophagitis: Secondary | ICD-10-CM | POA: Insufficient documentation

## 2018-08-12 DIAGNOSIS — Z6841 Body Mass Index (BMI) 40.0 and over, adult: Secondary | ICD-10-CM

## 2018-08-12 DIAGNOSIS — Z96653 Presence of artificial knee joint, bilateral: Secondary | ICD-10-CM | POA: Insufficient documentation

## 2018-08-12 DIAGNOSIS — E1165 Type 2 diabetes mellitus with hyperglycemia: Secondary | ICD-10-CM

## 2018-08-12 DIAGNOSIS — Z79899 Other long term (current) drug therapy: Secondary | ICD-10-CM | POA: Insufficient documentation

## 2018-08-12 DIAGNOSIS — Z7984 Long term (current) use of oral hypoglycemic drugs: Secondary | ICD-10-CM | POA: Insufficient documentation

## 2018-08-12 LAB — POCT GLYCOSYLATED HEMOGLOBIN (HGB A1C): HbA1c, POC (controlled diabetic range): 8.1 % — AB (ref 0.0–7.0)

## 2018-08-12 LAB — GLUCOSE, POCT (MANUAL RESULT ENTRY): POC GLUCOSE: 202 mg/dL — AB (ref 70–99)

## 2018-08-12 MED ORDER — GLIPIZIDE 10 MG PO TABS: 10.0000 mg | ORAL_TABLET | Freq: Two times a day (BID) | ORAL | 3 refills | Status: DC

## 2018-08-12 MED ORDER — ENOXAPARIN SODIUM 120 MG/0.8ML ~~LOC~~ SOLN: 120.0000 mg | Freq: Two times a day (BID) | SUBCUTANEOUS | 0 refills | Status: DC

## 2018-08-12 MED ORDER — TRAMADOL HCL 50 MG PO TABS: 50.0000 mg | ORAL_TABLET | Freq: Two times a day (BID) | ORAL | 1 refills | Status: DC | PRN

## 2018-08-12 MED ORDER — WARFARIN SODIUM 5 MG PO TABS: 5.0000 mg | ORAL_TABLET | Freq: Every day | ORAL | 3 refills | Status: DC

## 2018-08-12 MED ORDER — GABAPENTIN 300 MG PO CAPS: 300.0000 mg | ORAL_CAPSULE | Freq: Three times a day (TID) | ORAL | 3 refills | Status: DC

## 2018-08-12 MED ORDER — METHOCARBAMOL 750 MG PO TABS: 750.0000 mg | ORAL_TABLET | Freq: Two times a day (BID) | ORAL | 1 refills | Status: DC | PRN

## 2018-08-12 MED FILL — GABAPENTIN 300 MG CAPSULE: 300 | 30 days supply | Qty: 90 | Fill #0

## 2018-08-12 MED FILL — traMADol HCL 50 MG TABS: 50 | 7 days supply | Qty: 60 | Fill #0

## 2018-08-12 MED FILL — glipiZIDE 10 MG TABS: 10 | 30 days supply | Qty: 60 | Fill #0

## 2018-08-12 MED FILL — WARFARIN SODIUM 5 MG TABLET: 5 | 30 days supply | Qty: 30 | Fill #0

## 2018-08-12 MED FILL — METHOCARBAMOL 750 MG TABS: 750 | 30 days supply | Qty: 60 | Fill #0

## 2018-08-12 MED FILL — ENOXAPARIN 120 MG/0.8 ML SY: 120 | 5 days supply | Qty: 8 | Fill #0

## 2018-08-12 NOTE — Progress Notes (Signed)
Subjective:  Patient ID: David Dunlap, male    DOB: December 17, 1970  Age: 47 y.o. MRN: [id number]  CC: Diabetes and Hypertension   HPI David Dunlap  is a  47 year old male with a history of type 2 diabetes mellitus (A1c 8.1) hypertension, chronic PE, recurrent DVT (most recent episode in 06/2018) depression, right total knee replacement in 04/2017, left total knee replacement in 04/2018, obstructive sleep apnea who presents today for a follow-up visit. His episode of left lower extremity DVT occurred while on Xarelto (of note he did have left knee replacement) and he was thought to have failed Xarelto.  Seen by hematology with recommendations to switch to Coumadin and Lovenox especially given morbid obesity. David Dunlap has not been taking his Xarelto-stopped 3 days ago because he states "it was not working."  He continues to complain of left knee pain and received 2-week supply of hydrocodone from [facility] at his last visit 2 weeks ago and does not see them again until 09/2017.  He had declined PT referral due to lack of medical coverage and lack of finances.  He is currently working on approval for disability.  His diabetes is uncontrolled with A1c of 8.1 which has trended up from 7.6 previously and he endorses compliance with his medications.  Neuropathy is controlled on gabapentin, he has no visual concerns.  Not adherent with a diabetic diet or exercise.  Past Medical History:  Diagnosis Date  . Arthritis   . Depression   . Diabetes mellitus without complication (HCC) 05/2016   NEW ONSET    WITH FAMILY HISTORY  . DVT (deep venous thrombosis) (HCC)   . Essential hypertension   . Gallstones 05/2016  . GERD (gastroesophageal reflux disease)   . Sickle cell anemia (HCC)    Sickle trait  . Sleep apnea    Cpap    Past Surgical History:  Procedure Laterality Date  . CHOLECYSTECTOMY N/A 06/07/2016   Procedure: LAPAROSCOPIC CHOLECYSTECTOMY WITH ATTEMPTED INTRAOPERATIVE  CHOLANGIOGRAM;  Surgeon: Violeta Gelinas, MD;  Location: [facility] OR;  Service: General;  Laterality: N/A;  . LIGAMENT REPAIR     R forearm  . TOTAL KNEE ARTHROPLASTY Right 04/17/2017   Procedure: RIGHT TOTAL KNEE ARTHROPLASTY;  Surgeon: Tarry Kos, MD;  Location: [facility] OR;  Service: Orthopedics;  Laterality: Right;  . TOTAL KNEE ARTHROPLASTY Left 04/28/2018   Procedure: LEFT TOTAL KNEE ARTHROPLASTY;  Surgeon: Tarry Kos, MD;  Location: [facility] OR;  Service: Orthopedics;  Laterality: Left;    No Known Allergies   Outpatient Medications Prior to Visit  Medication Sig Dispense Refill  . atorvastatin (LIPITOR) 20 MG tablet Take 1 tablet (20 mg total) by mouth daily. 30 tablet 5  . Blood Glucose Monitoring Suppl (TRUE METRIX METER) DEVI 1 each by Does not apply route 3 (three) times daily before meals. 1 Device 0  . cetirizine (ZYRTEC) 10 MG tablet TAKE 1 TABLET BY MOUTH DAILY. (Patient taking differently: Take 10 mg by mouth daily. ) 30 tablet 1  . FLUoxetine (PROZAC) 20 MG tablet Take 2 tablets (40 mg total) by mouth daily. 60 tablet 5  . glucose blood (TRUE METRIX BLOOD GLUCOSE TEST) test strip Used daily before meals 100 each 12  . hydrochlorothiazide (HYDRODIURIL) 25 MG tablet Take 1 tablet (25 mg total) by mouth daily. 30 tablet 5  . HYDROcodone-acetaminophen (NORCO) 5-325 MG tablet Take 1 tablet by mouth 2 (two) times daily as needed for moderate pain. 14 tablet 0  . lidocaine (LIDODERM)  5 % Place 1 patch onto the skin daily for 30 doses. Remove & Discard patch within 12 hours or as directed by MD (Patient not taking: Reported on 07/17/2018) 30 patch 0  . lisinopril (PRINIVIL,ZESTRIL) 10 MG tablet Take 1 tablet (10 mg total) by mouth daily. 30 tablet 5  . metFORMIN (GLUCOPHAGE) 500 MG tablet Take 2 tablets (1,000 mg total) by mouth 2 (two) times daily with a meal. (Patient taking differently: Take 500 mg by mouth 2 (two) times daily with a meal. ) 120 tablet 5  . ondansetron (ZOFRAN) 4 MG tablet Take  1 tablet (4 mg total) by mouth every 6 (six) hours. (Patient not taking: Reported on 07/14/2018) 12 tablet 0  . promethazine (PHENERGAN) 25 MG tablet Take 1 tablet (25 mg total) by mouth every 6 (six) hours as needed for nausea. (Patient not taking: Reported on 06/18/2018) 30 tablet 1  . sildenafil (VIAGRA) 50 MG tablet Take 50 mg by mouth daily as needed for erectile dysfunction (at least 24 hours between doses).    . TRUEPLUS LANCETS 28G MISC Use daily before meals 30 each 12  . dicyclomine (BENTYL) 20 MG tablet Take 1 tablet (20 mg total) by mouth 2 (two) times daily. 20 tablet 0  . gabapentin (NEURONTIN) 300 MG capsule Take 1 capsule (300 mg total) by mouth 3 (three) times daily. 90 capsule 3  . glipiZIDE (GLUCOTROL) 5 MG tablet Take 1 tablet (5 mg total) by mouth 2 (two) times daily before a meal. 60 tablet 5  . methocarbamol (ROBAXIN) 750 MG tablet Take 1 tablet (750 mg total) by mouth 2 (two) times daily as needed for muscle spasms. (Patient not taking: Reported on 07/17/2018) 60 tablet 0  . rivaroxaban (XARELTO) 20 MG TABS tablet Take 1 tablet (20 mg total) by mouth daily. (Patient not taking: Reported on 08/12/2018) 30 tablet 5  . traMADol (ULTRAM) 50 MG tablet 1-2 pills every 6 hours as needed for pain 40 tablet 0   No facility-administered medications prior to visit.     ROS Review of Systems  Constitutional: Negative for activity change and appetite change.  HENT: Negative for sinus pressure and sore throat.   Eyes: Negative for visual disturbance.  Respiratory: Negative for cough, chest tightness and shortness of breath.   Cardiovascular: Negative for chest pain and leg swelling.  Gastrointestinal: Negative for abdominal distention, abdominal pain, constipation and diarrhea.  Endocrine: Negative.   Genitourinary: Negative for dysuria.  Musculoskeletal:       See hpi  Skin: Negative for rash.  Allergic/Immunologic: Negative.   Neurological: Negative for weakness,  light-headedness and numbness.  Psychiatric/Behavioral: Negative for dysphoric mood and suicidal ideas.    Objective:  BP (!) 131/91   Pulse 90   Temp 98.7 F (37.1 C) (Oral)   Resp 16   Wt 281 lb 9.6 oz (127.7 kg)   SpO2 95%   BMI 41.59 kg/m   BP/Weight 08/12/2018 08/06/2018 07/17/2018  Systolic BP 131 128 123  Diastolic BP 91 94 69  Wt. (Lbs) 281.6 282.4 281.8  BMI 41.59 41.7 41.61     Physical Exam  Constitutional: He is oriented to person, place, and time. He appears well-developed and well-nourished.  Cardiovascular: Normal rate, normal heart sounds and intact distal pulses.  No murmur heard. Pulmonary/Chest: Effort normal and breath sounds normal. He has no wheezes. He has no rales. He exhibits no tenderness.  Abdominal: Soft. Bowel sounds are normal. He exhibits no distension and no mass. There  is no tenderness.  Musculoskeletal:  Healed surgical scars on bilateral knee Crepitus on range of motion of bilateral knee but left knee is associated with tenderness to palpation circumferentially around left knee joint and with range of motion  Neurological: He is alert and oriented to person, place, and time.  Ambulates with a cane  Skin: Skin is warm and dry.     CMP Latest Ref Rng & Units 07/15/2018 07/14/2018 04/29/2018  Glucose 70 - 99 mg/dL 161(W) 960(A) 540(J)  BUN 6 - 20 mg/dL 6 7 15   Creatinine 0.61 - 1.24 mg/dL 8.11 9.14 7.82  Sodium 135 - 145 mmol/L 138 137 135  Potassium 3.5 - 5.1 mmol/L 3.9 3.8 4.2  Chloride 98 - 111 mmol/L 101 99 102  CO2 22 - 32 mmol/L 26 - 23  Calcium 8.9 - 10.3 mg/dL 9.2 - 8.3(L)  Total Protein 6.5 - 8.1 g/dL - - -  Total Bilirubin 0.3 - 1.2 mg/dL - - -  Alkaline Phos 38 - 126 U/L - - -  AST 15 - 41 U/L - - -  ALT 0 - 44 U/L - - -    Lipid Panel     Component Value Date/Time   CHOL 121 12/04/2017 1344   TRIG 108 12/04/2017 1344   HDL 38 (L) 12/04/2017 1344   CHOLHDL 3.2 12/04/2017 1344   CHOLHDL 4.6 12/08/2014 0942   VLDL  24 12/08/2014 0942   LDLCALC 61 12/04/2017 1344    Lab Results  Component Value Date   HGBA1C 8.1 (A) 08/12/2018    Assessment & Plan:   1. Type 2 diabetes mellitus with other specified complication, without long-term current use of insulin (HCC) Uncontrolled with A1c of 8.1 Increase glipizide dose Counseled on Diabetic diet, my plate method, 956 minutes of moderate intensity exercise/week Keep blood sugar logs with fasting goals of 80-120 mg/dl, random of less than 213 and in the event of sugars less than 60 mg/dl or greater than 086 mg/dl please notify the clinic ASAP. It is recommended that you undergo annual eye exams and annual foot exams. Pneumonia vaccine is recommended. - POCT glucose (manual entry) - POCT glycosylated hemoglobin (Hb A1C) - glipiZIDE (GLUCOTROL) 10 MG tablet; Take 1 tablet (10 mg total) by mouth 2 (two) times daily before a meal.  Dispense: 60 tablet; Refill: 3  2. Status post left knee replacement He never underwent physical therapy due to lack of medical coverage Advised to apply for the [facility] financial discount so I can refer him for PT Unable to refer to pain management given lack of medical coverage - traMADol (ULTRAM) 50 MG tablet; Take 1 tablet (50 mg total) by mouth every 12 (twelve) hours as needed. 1-2 pills every 6 hours as needed for pain  Dispense: 60 tablet; Refill: 1  3. Neuropathy Stable - gabapentin (NEURONTIN) 300 MG capsule; Take 1 capsule (300 mg total) by mouth 3 (three) times daily.  Dispense: 90 capsule; Refill: 3  4. 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  Dispense: 30 tablet; Refill: 3 - enoxaparin (LOVENOX) 120 MG/0.8ML injection; Inject 0.8 mLs (120 mg total) into the skin every 12 (twelve) hours.  Dispense: 10 mL; Refill: 0   Meds ordered this encounter  Medications  . traMADol (ULTRAM) 50 MG tablet    Sig: Take 1 tablet (50 mg total) by mouth every 12 (twelve) hours as needed. 1-2 pills every 6 hours as needed for pain    Dispense:  60 tablet    Refill:  1  . gabapentin (NEURONTIN) 300 MG capsule    Sig: Take 1 capsule (300 mg total) by mouth 3 (three) times daily.    Dispense:  90 capsule    Refill:  3  . glipiZIDE (GLUCOTROL) 10 MG tablet    Sig: Take 1 tablet (10 mg total) by mouth 2 (two) times daily before a meal.    Dispense:  60 tablet    Refill:  3  . methocarbamol (ROBAXIN) 750 MG tablet    Sig: Take 1 tablet (750 mg total) by mouth 2 (two) times daily as needed for muscle spasms.    Dispense:  60 tablet    Refill:  1  . warfarin (COUMADIN) 5 MG tablet    Sig: Take 1 tablet (5 mg total) by mouth daily.    Dispense:  30 tablet    Refill:  3  . enoxaparin (LOVENOX) 120 MG/0.8ML injection    Sig: Inject 0.8 mLs (120 mg total) into the skin every 12 (twelve) hours.    Dispense:  10 mL    Refill:  0    Follow-up: Return in about 3 months (around 11/12/2018) for Follow-up of chronic medical conditions.   Hoy Register MD

## 2018-08-12 NOTE — Patient Instructions (Signed)

## 2018-08-12 NOTE — Progress Notes (Signed)
Pt states his left knee hurts the most  Pt is needing a refill on robaxin and hydrocodeone  Pt states the lidocaine patch doesn't work

## 2018-08-13 ENCOUNTER — Encounter: Payer: Self-pay | Admitting: Hematology and Oncology

## 2018-08-18 ENCOUNTER — Ambulatory Visit: Payer: Self-pay | Attending: Family Medicine | Admitting: Pharmacist

## 2018-08-18 DIAGNOSIS — I82509 Chronic embolism and thrombosis of unspecified deep veins of unspecified lower extremity: Secondary | ICD-10-CM | POA: Insufficient documentation

## 2018-08-18 DIAGNOSIS — Z7901 Long term (current) use of anticoagulants: Secondary | ICD-10-CM | POA: Insufficient documentation

## 2018-08-18 LAB — POCT INR: INR: 1.1 — AB (ref 2.0–3.0)

## 2018-08-18 MED ORDER — ENOXAPARIN SODIUM 120 MG/0.8ML ~~LOC~~ SOLN: 120.0000 mg | Freq: Two times a day (BID) | SUBCUTANEOUS | 3 refills | Status: DC

## 2018-08-18 NOTE — Progress Notes (Signed)
Pt seen for Lovenox-Coumadin bridge. Pt reports using last syringe last night. He is currently taking 5 mg daily of warfarin and denies missed doses. After I explained to the patient that he would still require bridge as INR is 1.1, he became very angry/frustrated. He states that he cannot afford our discounted Lovenox. Coordinated with our pharmacy. He will receive a 25-day supply of syringes for $10.   Consulted with patient's PCP who recommended to have patient increase warfarin to 1.5 tablets (7.5 mg) daily. He has been instructed to do so.

## 2018-08-19 MED FILL — ENOXAPARIN 120 MG/0.8 ML SY: 120 | 5 days supply | Qty: 8 | Fill #0

## 2018-08-25 ENCOUNTER — Ambulatory Visit: Payer: Self-pay | Attending: Family Medicine | Admitting: Pharmacist

## 2018-08-25 DIAGNOSIS — I82509 Chronic embolism and thrombosis of unspecified deep veins of unspecified lower extremity: Secondary | ICD-10-CM | POA: Insufficient documentation

## 2018-08-25 DIAGNOSIS — Z7901 Long term (current) use of anticoagulants: Secondary | ICD-10-CM | POA: Insufficient documentation

## 2018-08-25 LAB — POCT INR: INR: 2 (ref 2.0–3.0)

## 2018-09-15 ENCOUNTER — Telehealth: Payer: Self-pay | Admitting: Family Medicine

## 2018-09-15 ENCOUNTER — Ambulatory Visit: Payer: Self-pay | Attending: Family Medicine | Admitting: Pharmacist

## 2018-09-15 DIAGNOSIS — I82509 Chronic embolism and thrombosis of unspecified deep veins of unspecified lower extremity: Secondary | ICD-10-CM | POA: Insufficient documentation

## 2018-09-15 DIAGNOSIS — Z7901 Long term (current) use of anticoagulants: Secondary | ICD-10-CM | POA: Insufficient documentation

## 2018-09-15 LAB — POCT INR: INR: 1 — AB (ref 2.0–3.0)

## 2018-09-15 MED ORDER — WARFARIN SODIUM 5 MG PO TABS: ORAL_TABLET | ORAL | 2 refills | Status: DC

## 2018-09-15 MED FILL — WARFARIN SODIUM 5 MG TABLET: 5 | 30 days supply | Qty: 45 | Fill #0

## 2018-09-15 NOTE — Telephone Encounter (Signed)
Patient dropped off paperwork for food stamps for PCP to fill out. Please follow up.  Paperwork will be dropped off in PCP box

## 2018-09-15 NOTE — Addendum Note (Signed)
Addended by: Lois HuxleyVAN AUSDALL, Jeannett SeniorSTEPHEN L on: 09/15/2018 10:29 AM   Modules accepted: Orders

## 2018-09-15 NOTE — Telephone Encounter (Signed)
Paperwork has been received. 

## 2018-09-18 ENCOUNTER — Telehealth: Payer: Self-pay | Admitting: Internal Medicine

## 2018-09-18 NOTE — Telephone Encounter (Signed)
RR not in office. Moved appointments from 1/8 to 1/10. Left message for patient. Schedule mailed.

## 2018-09-24 ENCOUNTER — Ambulatory Visit: Payer: Self-pay | Admitting: Hematology and Oncology

## 2018-09-26 ENCOUNTER — Inpatient Hospital Stay: Payer: Self-pay | Attending: Hematology and Oncology | Admitting: Internal Medicine

## 2018-10-06 ENCOUNTER — Ambulatory Visit: Payer: Self-pay | Attending: Family Medicine | Admitting: Pharmacist

## 2018-10-08 ENCOUNTER — Telehealth: Payer: Self-pay

## 2018-10-08 NOTE — Telephone Encounter (Signed)
Per 1/22 patient walk in request for appointment with [facility]. Missed last appointment on 1/7

## 2018-10-14 ENCOUNTER — Encounter (INDEPENDENT_AMBULATORY_CARE_PROVIDER_SITE_OTHER): Payer: Self-pay | Admitting: Orthopaedic Surgery

## 2018-10-14 ENCOUNTER — Ambulatory Visit (INDEPENDENT_AMBULATORY_CARE_PROVIDER_SITE_OTHER): Payer: Self-pay

## 2018-10-14 ENCOUNTER — Ambulatory Visit (INDEPENDENT_AMBULATORY_CARE_PROVIDER_SITE_OTHER): Payer: Self-pay | Admitting: Orthopaedic Surgery

## 2018-10-14 VITALS — Ht 69.0 in | Wt 281.0 lb

## 2018-10-14 DIAGNOSIS — Z96652 Presence of left artificial knee joint: Secondary | ICD-10-CM

## 2018-10-14 DIAGNOSIS — M25562 Pain in left knee: Secondary | ICD-10-CM

## 2018-10-14 DIAGNOSIS — Z96651 Presence of right artificial knee joint: Secondary | ICD-10-CM

## 2018-10-14 DIAGNOSIS — G8929 Other chronic pain: Secondary | ICD-10-CM

## 2018-10-14 DIAGNOSIS — M25561 Pain in right knee: Secondary | ICD-10-CM

## 2018-10-14 DIAGNOSIS — Z96653 Presence of artificial knee joint, bilateral: Secondary | ICD-10-CM

## 2018-10-14 NOTE — Progress Notes (Signed)
Office Visit Note   Patient: David Dunlap D Koppenhaver           Date of Birth: Jul 08, 1971           MRN: [id number] Visit Date: 10/14/2018              Requested by: Hoy RegisterNewlin, Enobong, MD [address] [place], [place] [id number] PCP: Hoy RegisterNewlin, Enobong, MD   Assessment & Plan: Visit Diagnoses:  1. Chronic pain of both knees   2. S/P total knee replacement, left   3. S/P TKR (total knee replacement), right     Plan: Impression is status post left total knee replacement with chronic pain in both knees.  We will obtain blood work to rule out infection.  Overall the sense that I am getting is he has chronic pain with overall physical deconditioning.  I urged the importance of diet and exercise and weight loss.  He has allowed his [employer] discount plan to lapse for some reason that I do not understand.  As a result he has not done any physical therapy.  He understands that he needs to take more responsibility for his own health.  We will see him back in 6 months for follow-up of his knee replacements with 2 view x-rays of bilateral knees.  We will notify him of the lab work results. Total face to face encounter time was greater than 25 minutes and over half of this time was spent in counseling and/or coordination of care.  Follow-Up Instructions: Return in about 6 months (around 04/14/2019).   Orders:  Orders Placed This Encounter  Procedures  . XR Knee 1-2 Views Right  . XR Knee 1-2 Views Left   No orders of the defined types were placed in this encounter.     Procedures: No procedures performed   Clinical Data: No additional findings.   Subjective: Chief Complaint  Patient presents with  . Left Knee - Follow-up    04/2018 TKR    Dunlap DanceKeith is approximately 5 months status post left total knee replacement.  He reports that he has chronic pain with his left knee.  His right knee also exhibits chronic pain.  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  He denies any constitutional symptoms,  any joint effusion or signs or symptoms of infection.   Review of Systems  Constitutional: Negative.   All other systems reviewed and are negative.    Objective: Vital Signs: Ht 5\' 9"  (1.753 m)   Wt 281 lb (127.5 kg)   BMI 41.50 kg/m   Physical Exam Vitals signs and nursing note reviewed.  Constitutional:      Appearance: He is well-developed.  Pulmonary:     Effort: Pulmonary effort is normal.  Abdominal:     Palpations: Abdomen is soft.  Skin:    General: Skin is warm.  Neurological:     Mental Status: He is alert and oriented to person, place, and time.  Psychiatric:        Behavior: Behavior normal.        Thought Content: Thought content normal.        Judgment: Judgment normal.     Ortho Exam Bilateral knee exam shows a fully healed surgical scars.  He has excellent range of motion.  Stable to varus valgus.  Patella tracking is normal.  No joint effusion.  No warmth or cellulitis. Specialty Comments:  No specialty comments available.  Imaging:  Xr Knee 1-2 Views Left  Result Date: 10/14/2018 Stable total knee replacement in good alignment.   Xr Knee 1-2 Views Right  Result Date: 10/14/2018 Stable total knee replacement in good alignment     PMFS History: Patient Active Problem List   Diagnosis Date Noted  . Morbid obesity (HCC) 07/22/2018  . Body mass index 40.0-44.9, adult (HCC) 07/22/2018  . S/P total knee replacement, left 04/28/2018  . Primary osteoarthritis of left knee   . Unilateral primary osteoarthritis, left knee 04/10/2018  . Obstructive sleep apnea 09/05/2017  . S/P TKR (total knee replacement), right 04/17/2017  . GERD (gastroesophageal reflux disease) 11/27/2016  . Unilateral primary osteoarthritis, right knee 10/18/2016  . Neuropathy 07/25/2016  . Acute cholecystitis 06/05/2016  . Diabetes mellitus without complication (HCC) 05/18/2016  . Cholelithiasis 04/09/2016  . Major  depressive disorder, single episode 04/09/2016  . Leg DVT (deep venous thromboembolism), chronic (HCC) 12/08/2014  . Family history of diabetes mellitus (DM) 12/08/2014  . Moderate major depression, single episode (HCC) 08/22/2011  . Constipation 11/22/2010  . ANKLE EDEMA 10/19/2010  . Essential hypertension 10/12/2010   Past Medical History:  Diagnosis Date  . Arthritis   . Depression   . Diabetes mellitus without complication (HCC) 05/2016   NEW ONSET    WITH FAMILY HISTORY  . DVT (deep venous thrombosis) (HCC)   . Essential hypertension   . Gallstones 05/2016  . GERD (gastroesophageal reflux disease)   . Sickle cell anemia (HCC)    Sickle trait  . Sleep apnea    Cpap    Family History  Problem Relation Age of Onset  . Diabetes Mother   . Diabetes Father   . Hypertension Sister     Past Surgical History:  Procedure Laterality Date  . CHOLECYSTECTOMY N/A 06/07/2016   Procedure: LAPAROSCOPIC CHOLECYSTECTOMY WITH ATTEMPTED INTRAOPERATIVE CHOLANGIOGRAM;  Surgeon: Violeta GelinasBurke Thompson, MD;  Location: [facility] OR;  Service: General;  Laterality: N/A;  . LIGAMENT REPAIR     R forearm  . TOTAL KNEE ARTHROPLASTY Right 04/17/2017   Procedure: RIGHT TOTAL KNEE ARTHROPLASTY;  Surgeon: Tarry KosXu, Cookie Pore M, MD;  Location: [facility] OR;  Service: Orthopedics;  Laterality: Right;  . TOTAL KNEE ARTHROPLASTY Left 04/28/2018   Procedure: LEFT TOTAL KNEE ARTHROPLASTY;  Surgeon: Tarry KosXu, Luz Burcher M, MD;  Location: [facility] OR;  Service: Orthopedics;  Laterality: Left;   Social History   Occupational History  . Not on file  Tobacco Use  . Smoking status: Never Smoker  . Smokeless tobacco: Never Used  Substance and Sexual Activity  . Alcohol use: Yes    Alcohol/week: 1.0 - 2.0 standard drinks    Types: 1 - 2 Cans of beer per week    Comment: doesn't drink all the drink  . Drug use: No    Comment: last time 1989  . Sexual activity: Not on file

## 2018-10-14 NOTE — Addendum Note (Signed)
Addended by: Rodena Medin on: 10/14/2018 11:17 AM   Modules accepted: Orders

## 2018-10-15 ENCOUNTER — Telehealth (INDEPENDENT_AMBULATORY_CARE_PROVIDER_SITE_OTHER): Payer: Self-pay | Admitting: Orthopaedic Surgery

## 2018-10-15 LAB — CBC WITH DIFFERENTIAL/PLATELET
ABSOLUTE MONOCYTES: 1088 {cells}/uL — AB (ref 200–950)
BASOS PCT: 0.5 %
Basophils Absolute: 63 cells/uL (ref 0–200)
Eosinophils Absolute: 138 cells/uL (ref 15–500)
Eosinophils Relative: 1.1 %
HEMATOCRIT: 48.2 % (ref 38.5–50.0)
HEMOGLOBIN: 16.4 g/dL (ref 13.2–17.1)
LYMPHS ABS: 2713 {cells}/uL (ref 850–3900)
MCH: 28.1 pg (ref 27.0–33.0)
MCHC: 34 g/dL (ref 32.0–36.0)
MCV: 82.7 fL (ref 80.0–100.0)
MPV: 10.8 fL (ref 7.5–12.5)
Monocytes Relative: 8.7 %
NEUTROS PCT: 68 %
Neutro Abs: 8500 cells/uL — ABNORMAL HIGH (ref 1500–7800)
Platelets: 211 10*3/uL (ref 140–400)
RBC: 5.83 10*6/uL — ABNORMAL HIGH (ref 4.20–5.80)
RDW: 14.1 % (ref 11.0–15.0)
Total Lymphocyte: 21.7 %
WBC: 12.5 10*3/uL — ABNORMAL HIGH (ref 3.8–10.8)

## 2018-10-15 LAB — SEDIMENTATION RATE: Sed Rate: 2 mm/h (ref 0–15)

## 2018-10-15 LAB — C-REACTIVE PROTEIN: CRP: 9.2 mg/L — ABNORMAL HIGH (ref <8.0)

## 2018-10-15 NOTE — Telephone Encounter (Signed)
Called patient left message on voicemail to return call to make an appointment with Dr. Roda Shutters for Chronic bil knee pain

## 2018-10-15 NOTE — Progress Notes (Signed)
Needs f/u appt with me.  Thanks.

## 2018-10-16 ENCOUNTER — Telehealth (INDEPENDENT_AMBULATORY_CARE_PROVIDER_SITE_OTHER): Payer: Self-pay | Admitting: Orthopaedic Surgery

## 2018-10-16 ENCOUNTER — Inpatient Hospital Stay: Payer: Self-pay | Admitting: Internal Medicine

## 2018-10-16 NOTE — Telephone Encounter (Signed)
Called patient scheduled an appointment with Dr Roda Shutters 10/21/2018 at 2:30pm

## 2018-10-21 ENCOUNTER — Ambulatory Visit (INDEPENDENT_AMBULATORY_CARE_PROVIDER_SITE_OTHER): Payer: Self-pay | Admitting: Orthopaedic Surgery

## 2018-10-21 ENCOUNTER — Encounter (INDEPENDENT_AMBULATORY_CARE_PROVIDER_SITE_OTHER): Payer: Self-pay | Admitting: Orthopaedic Surgery

## 2018-10-21 DIAGNOSIS — Z96651 Presence of right artificial knee joint: Secondary | ICD-10-CM

## 2018-10-21 DIAGNOSIS — Z96652 Presence of left artificial knee joint: Secondary | ICD-10-CM

## 2018-10-21 DIAGNOSIS — G8929 Other chronic pain: Secondary | ICD-10-CM

## 2018-10-21 DIAGNOSIS — M25561 Pain in right knee: Secondary | ICD-10-CM

## 2018-10-21 DIAGNOSIS — M25562 Pain in left knee: Secondary | ICD-10-CM

## 2018-10-21 MED ORDER — LIDOCAINE HCL 1 % IJ SOLN
5.0000 mL | INTRAMUSCULAR | Status: AC | PRN
  Administered 2018-10-21: 5 mL

## 2018-10-21 NOTE — Addendum Note (Signed)
Addended by: Albertina ParrGARCIA, Nakeeta Sebastiani on: 10/21/2018 03:56 PM   Modules accepted: Orders

## 2018-10-21 NOTE — Addendum Note (Signed)
Addended by: Albertina Parr on: 10/21/2018 04:00 PM   Modules accepted: Orders

## 2018-10-21 NOTE — Progress Notes (Signed)
Office Visit Note   Patient: David Dunlap           Date of Birth: 04-Jul-1971           MRN: [id number] Visit Date: 10/21/2018              Requested by: Hoy RegisterNewlin, Enobong, MD [address] [place], [place] [id number] PCP: Hoy RegisterNewlin, Enobong, MD   Assessment & Plan: Visit Diagnoses:  1. S/P total knee replacement, left   2. S/P TKR (total knee replacement), right   3. Chronic pain of both knees     Plan: Due to slight elevation in his peripheral white blood cell count and CRP , bilateral knees were aspirated today to rule out infection.  25 cc of blood-tinged joint fluid was aspirated from the left knee and 15 cc of blood-tinged joint fluid from the right knee.  This fluid was sent off to the lab.  We will notify the patient with the results.  Follow-Up Instructions: Return if symptoms worsen or fail to improve.   Orders:  No orders of the defined types were placed in this encounter.  No orders of the defined types were placed in this encounter.     Procedures: Large Joint Inj: bilateral knee on 10/21/2018 3:47 PM Indications: pain Details: 22 G needle  Arthrogram: No  Medications (Right): 5 mL lidocaine 1 % Aspirate (Right): 15 mL blood-tinged; sent for lab analysis Medications (Left): 5 mL lidocaine 1 % Aspirate (Left): 25 mL blood-tinged; sent for lab analysis Outcome: tolerated well, no immediate complications Patient was prepped and draped in the usual sterile fashion.       Clinical Data: No additional findings.   Subjective: Chief Complaint  Patient presents with  . Left Knee - Pain  . Right Knee - Pain    David Dunlap follows up today for follow-up of his recent lab work.  He states that he has constant pain in both knees.   Review of Systems   Objective: Vital Signs: There were no vitals taken for this visit.  Physical Exam  Ortho Exam Bilateral knee exams show fully healed surgical scars.  No joint effusion. Specialty Comments:  No  specialty comments available.  Imaging: No results found.   PMFS History: Patient Active Problem List   Diagnosis Date Noted  . Morbid obesity (HCC) 07/22/2018  . Body mass index 40.0-44.9, adult (HCC) 07/22/2018  . S/P total knee replacement, left 04/28/2018  . Primary osteoarthritis of left knee   . Unilateral primary osteoarthritis, left knee 04/10/2018  . Obstructive sleep apnea 09/05/2017  . S/P TKR (total knee replacement), right 04/17/2017  . GERD (gastroesophageal reflux disease) 11/27/2016  . Unilateral primary osteoarthritis, right knee 10/18/2016  . Neuropathy 07/25/2016  . Acute cholecystitis 06/05/2016  . Diabetes mellitus without complication (HCC) 05/18/2016  . Cholelithiasis 04/09/2016  . Major depressive disorder, single episode 04/09/2016  . Leg DVT (deep venous thromboembolism), chronic (HCC) 12/08/2014  . Family history of diabetes mellitus (DM) 12/08/2014  . Moderate major depression, single episode (HCC) 08/22/2011  . Constipation 11/22/2010  . ANKLE EDEMA 10/19/2010  . Essential hypertension 10/12/2010   Past Medical History:  Diagnosis Date  . Arthritis   . Depression   . Diabetes mellitus without complication (HCC) 05/2016   NEW ONSET    WITH FAMILY HISTORY  . DVT (deep venous thrombosis) (HCC)   . Essential hypertension   . Gallstones 05/2016  . GERD (gastroesophageal reflux disease)   . Sickle cell  anemia (HCC)    Sickle trait  . Sleep apnea    Cpap    Family History  Problem Relation Age of Onset  . Diabetes Mother   . Diabetes Father   . Hypertension Sister     Past Surgical History:  Procedure Laterality Date  . CHOLECYSTECTOMY N/A 06/07/2016   Procedure: LAPAROSCOPIC CHOLECYSTECTOMY WITH ATTEMPTED INTRAOPERATIVE CHOLANGIOGRAM;  Surgeon: Violeta Gelinas, MD;  Location: [facility] OR;  Service: General;  Laterality: N/A;  . LIGAMENT REPAIR     R forearm  . TOTAL KNEE ARTHROPLASTY Right 04/17/2017   Procedure: RIGHT TOTAL KNEE ARTHROPLASTY;   Surgeon: Tarry Kos, MD;  Location: [facility] OR;  Service: Orthopedics;  Laterality: Right;  . TOTAL KNEE ARTHROPLASTY Left 04/28/2018   Procedure: LEFT TOTAL KNEE ARTHROPLASTY;  Surgeon: Tarry Kos, MD;  Location: [facility] OR;  Service: Orthopedics;  Laterality: Left;   Social History   Occupational History  . Not on file  Tobacco Use  . Smoking status: Never Smoker  . Smokeless tobacco: Never Used  Substance and Sexual Activity  . Alcohol use: Yes    Alcohol/week: 1.0 - 2.0 standard drinks    Types: 1 - 2 Cans of beer per week    Comment: doesn't drink all the drink  . Drug use: No    Comment: last time 1989  . Sexual activity: Not on file

## 2018-10-23 ENCOUNTER — Other Ambulatory Visit (INDEPENDENT_AMBULATORY_CARE_PROVIDER_SITE_OTHER): Payer: Self-pay

## 2018-10-23 DIAGNOSIS — G8929 Other chronic pain: Secondary | ICD-10-CM

## 2018-10-23 DIAGNOSIS — M25561 Pain in right knee: Principal | ICD-10-CM

## 2018-10-23 DIAGNOSIS — M25562 Pain in left knee: Principal | ICD-10-CM

## 2018-10-23 NOTE — Progress Notes (Signed)
Please let him know that the fluid we drew from his knees are normal and does not indicate infection.

## 2018-10-28 ENCOUNTER — Telehealth: Payer: Self-pay

## 2018-10-28 LAB — SYNOVIAL CELL COUNT + DIFF, W/ CRYSTALS
BASOPHILS, %: 0 %
Basophils, %: 0 %
EOSINOPHILS-SYNOVIAL: 0 % (ref 0–2)
Eosinophils-Synovial: 1 % (ref 0–2)
LYMPHOCYTES-SYNOVIAL FLD: 58 % (ref 0–74)
Lymphocytes-Synovial Fld: 41 % (ref 0–74)
MONOCYTE/MACROPHAGE: 10 % (ref 0–69)
MONOCYTE/MACROPHAGE: 24 % (ref 0–69)
NEUTROPHIL, SYNOVIAL: 32 % — AB (ref 0–24)
Neutrophil, Synovial: 7 % (ref 0–24)
Synoviocytes, %: 10 % (ref 0–15)
Synoviocytes, %: 17 % — ABNORMAL HIGH (ref 0–15)
WBC, SYNOVIAL: 979 {cells}/uL — AB (ref <150)
WBC, Synovial: 1735 cells/uL — ABNORMAL HIGH (ref <150)

## 2018-10-28 LAB — ANAEROBIC AND AEROBIC CULTURE
AER RESULT: NO GROWTH
AER RESULT:: NO GROWTH
GRAM STAIN:: NONE SEEN
GRAM STAIN:: NONE SEEN
MICRO NUMBER: 148073
MICRO NUMBER:: 148072
MICRO NUMBER:: 148074
MICRO NUMBER:: 148075
SPECIMEN QUALITY: ADEQUATE
SPECIMEN QUALITY: ADEQUATE
SPECIMEN QUALITY:: ADEQUATE
SPECIMEN QUALITY:: ADEQUATE

## 2018-10-28 NOTE — Telephone Encounter (Signed)
Spoke with patient concerning his upcoming appointment. Per 2/11 los

## 2018-11-13 ENCOUNTER — Ambulatory Visit: Payer: Self-pay | Admitting: Family Medicine

## 2018-11-17 ENCOUNTER — Other Ambulatory Visit: Payer: Self-pay

## 2018-11-17 ENCOUNTER — Ambulatory Visit: Payer: Self-pay | Attending: Family Medicine | Admitting: Family Medicine

## 2018-11-17 ENCOUNTER — Encounter: Payer: Self-pay | Admitting: Family Medicine

## 2018-11-17 VITALS — BP 145/99 | HR 75 | Temp 97.4°F | Ht 69.0 in | Wt 280.8 lb

## 2018-11-17 DIAGNOSIS — F322 Major depressive disorder, single episode, severe without psychotic features: Secondary | ICD-10-CM

## 2018-11-17 DIAGNOSIS — G629 Polyneuropathy, unspecified: Secondary | ICD-10-CM

## 2018-11-17 DIAGNOSIS — Z96653 Presence of artificial knee joint, bilateral: Secondary | ICD-10-CM

## 2018-11-17 DIAGNOSIS — I10 Essential (primary) hypertension: Secondary | ICD-10-CM

## 2018-11-17 DIAGNOSIS — I82509 Chronic embolism and thrombosis of unspecified deep veins of unspecified lower extremity: Secondary | ICD-10-CM

## 2018-11-17 DIAGNOSIS — E119 Type 2 diabetes mellitus without complications: Secondary | ICD-10-CM

## 2018-11-17 DIAGNOSIS — E1169 Type 2 diabetes mellitus with other specified complication: Secondary | ICD-10-CM

## 2018-11-17 LAB — POCT GLYCOSYLATED HEMOGLOBIN (HGB A1C): HBA1C, POC (CONTROLLED DIABETIC RANGE): 7.9 % — AB (ref 0.0–7.0)

## 2018-11-17 LAB — POCT INR: INR: 1 — AB (ref 2.0–3.0)

## 2018-11-17 LAB — GLUCOSE, POCT (MANUAL RESULT ENTRY): POC Glucose: 204 mg/dl — AB (ref 70–99)

## 2018-11-17 MED ORDER — ATORVASTATIN CALCIUM 20 MG PO TABS: 20.0000 mg | ORAL_TABLET | Freq: Every day | ORAL | 5 refills | Status: DC

## 2018-11-17 MED ORDER — LISINOPRIL 10 MG PO TABS: 10.0000 mg | ORAL_TABLET | Freq: Every day | ORAL | 5 refills | Status: DC

## 2018-11-17 MED ORDER — HYDROCHLOROTHIAZIDE 25 MG PO TABS: 25.0000 mg | ORAL_TABLET | Freq: Every day | ORAL | 5 refills | Status: DC

## 2018-11-17 MED ORDER — FLUOXETINE HCL 20 MG PO TABS: 40.0000 mg | ORAL_TABLET | Freq: Every day | ORAL | 5 refills | Status: DC

## 2018-11-17 MED ORDER — LIRAGLUTIDE 18 MG/3ML ~~LOC~~ SOPN: PEN_INJECTOR | SUBCUTANEOUS | 3 refills | Status: DC

## 2018-11-17 MED ORDER — WARFARIN SODIUM 5 MG PO TABS: ORAL_TABLET | ORAL | 2 refills | Status: DC

## 2018-11-17 MED ORDER — GABAPENTIN 300 MG PO CAPS: 300.0000 mg | ORAL_CAPSULE | Freq: Three times a day (TID) | ORAL | 5 refills | Status: DC

## 2018-11-17 MED ORDER — METFORMIN HCL 500 MG PO TABS: 1000.0000 mg | ORAL_TABLET | Freq: Two times a day (BID) | ORAL | 5 refills | Status: DC

## 2018-11-17 MED ORDER — GLIPIZIDE 10 MG PO TABS: 10.0000 mg | ORAL_TABLET | Freq: Two times a day (BID) | ORAL | 5 refills | Status: DC

## 2018-11-17 MED FILL — HYDROCHLOROTHIAZIDE 25 MG T: 25 | 30 days supply | Qty: 30 | Fill #1

## 2018-11-17 MED FILL — glipiZIDE 10 MG TABS: 10 | 30 days supply | Qty: 60 | Fill #1

## 2018-11-17 MED FILL — FLUoxetine HCL 20 MG TABS: 20 | 30 days supply | Qty: 60 | Fill #0

## 2018-11-17 MED FILL — !VICTOZA 18MG/3ML INJECT: 18 | 28 days supply | Qty: 6 | Fill #0

## 2018-11-17 MED FILL — LISINOPRIL 10 MG TABS: 10 | 30 days supply | Qty: 30 | Fill #1

## 2018-11-17 MED FILL — GABAPENTIN 300 MG CAPSULE: 300 | 30 days supply | Qty: 90 | Fill #0

## 2018-11-17 MED FILL — WARFARIN SODIUM 5 MG TABLET: 5 | 30 days supply | Qty: 45 | Fill #1

## 2018-11-17 MED FILL — ATORVASTATIN 20 MG TABLET: 20 | 30 days supply | Qty: 30 | Fill #0

## 2018-11-17 NOTE — Progress Notes (Signed)
Spoke to patient concerning Coumadin. Risk/benefit emphasized heavily. Discussed with PCP given his INR of 1.0. His family member is in the room and states that she believes pt to be nonadherent. He has been therapeutic on 7.5 mg daily of warfarin before. Recommend to re-start Coumadin at 7.5 mg daily and follow-up with me in 1 week.

## 2018-11-17 NOTE — Progress Notes (Signed)
Subjective:  Patient ID: David Dunlap, male    DOB: 1970/12/21  Age: 48 y.o. MRN: [id number]  CC: Diabetes   HPI David Dunlap is a  48 year old male with a history of type 2 diabetes mellitus (A1c 7.9) hypertension, chronic PE, recurrent DVT (most recent episode in 06/2018) depression, right total knee replacement in 04/2017, left total knee replacement in 04/2018, obstructive sleep apnea who presents today for a follow-up visit.  His A1c is 7.9 which is slightly down from 8.1 previously and he endorses compliance with metformin and glipizide.  Unable to exercise due to bilateral knee pain. Compliance with a diabetic diet cannot be ascertained as finances are a major hindrance to eating healthy meals. His depression is uncontrolled evident by an elevated PHQ 9 score which he attributes to lack of an income for several years, pending disability case and His medical conditions.  He endorses suicidal ideations but denies an active plan.  His spouse also endorses this. Currently remains on Prozac however he states medications will be unable to fix his current problems.  His knees are not any better after his surgery and he was unable to undergo PT after his left knee replacement due to lack of medical coverage.  Pain is described as an 8/10 in his left knee. With regards to his anticoagulation he has not been compliant with Coumadin and his INR is 1.0.  Past Medical History:  Diagnosis Date  . Arthritis   . Depression   . Diabetes mellitus without complication ([place]) 42/8768   NEW ONSET    WITH FAMILY HISTORY  . DVT (deep venous thrombosis) ([place])   . Essential hypertension   . Gallstones 05/2016  . GERD (gastroesophageal reflux disease)   . Sickle cell anemia (HCC)    Sickle trait  . Sleep apnea    Cpap    Past Surgical History:  Procedure Laterality Date  . CHOLECYSTECTOMY N/A 06/07/2016   Procedure: LAPAROSCOPIC CHOLECYSTECTOMY WITH ATTEMPTED INTRAOPERATIVE CHOLANGIOGRAM;   Surgeon: Georganna Skeans, MD;  Location: [facility];  Service: General;  Laterality: N/A;  . LIGAMENT REPAIR     R forearm  . TOTAL KNEE ARTHROPLASTY Right 04/17/2017   Procedure: RIGHT TOTAL KNEE ARTHROPLASTY;  Surgeon: Leandrew Koyanagi, MD;  Location: [facility];  Service: Orthopedics;  Laterality: Right;  . TOTAL KNEE ARTHROPLASTY Left 04/28/2018   Procedure: LEFT TOTAL KNEE ARTHROPLASTY;  Surgeon: Leandrew Koyanagi, MD;  Location: [facility];  Service: Orthopedics;  Laterality: Left;    Family History  Problem Relation Age of Onset  . Diabetes Mother   . Diabetes Father   . Hypertension Sister     No Known Allergies  Outpatient Medications Prior to Visit  Medication Sig Dispense Refill  . Blood Glucose Monitoring Suppl (TRUE METRIX METER) DEVI 1 each by Does not apply route 3 (three) times daily before meals. 1 Device 0  . cetirizine (ZYRTEC) 10 MG tablet TAKE 1 TABLET BY MOUTH DAILY. (Patient taking differently: Take 10 mg by mouth daily. ) 30 tablet 1  . glucose blood (TRUE METRIX BLOOD GLUCOSE TEST) test strip Used daily before meals 100 each 12  . HYDROcodone-acetaminophen (NORCO) 5-325 MG tablet Take 1 tablet by mouth 2 (two) times daily as needed for moderate pain. 14 tablet 0  . methocarbamol (ROBAXIN) 750 MG tablet Take 1 tablet (750 mg total) by mouth 2 (two) times daily as needed for muscle spasms. 60 tablet 1  . ondansetron (ZOFRAN) 4 MG tablet Take 1  tablet (4 mg total) by mouth every 6 (six) hours. 12 tablet 0  . sildenafil (VIAGRA) 50 MG tablet Take 50 mg by mouth daily as needed for erectile dysfunction (at least 24 hours between doses).    . traMADol (ULTRAM) 50 MG tablet Take 1 tablet (50 mg total) by mouth every 12 (twelve) hours as needed. 1-2 pills every 6 hours as needed for pain 60 tablet 1  . TRUEPLUS LANCETS 28G MISC Use daily before meals 30 each 12  . atorvastatin (LIPITOR) 20 MG tablet Take 1 tablet (20 mg total) by mouth daily. 30 tablet 5  . FLUoxetine (PROZAC) 20 MG tablet Take  2 tablets (40 mg total) by mouth daily. 60 tablet 5  . gabapentin (NEURONTIN) 300 MG capsule Take 1 capsule (300 mg total) by mouth 3 (three) times daily. 90 capsule 3  . glipiZIDE (GLUCOTROL) 10 MG tablet Take 1 tablet (10 mg total) by mouth 2 (two) times daily before a meal. 60 tablet 3  . hydrochlorothiazide (HYDRODIURIL) 25 MG tablet Take 1 tablet (25 mg total) by mouth daily. 30 tablet 5  . lisinopril (PRINIVIL,ZESTRIL) 10 MG tablet Take 1 tablet (10 mg total) by mouth daily. 30 tablet 5  . metFORMIN (GLUCOPHAGE) 500 MG tablet Take 2 tablets (1,000 mg total) by mouth 2 (two) times daily with a meal. (Patient taking differently: Take 500 mg by mouth 2 (two) times daily with a meal. ) 120 tablet 5  . warfarin (COUMADIN) 5 MG tablet Take 1 and 1/2 (1.5) tablets every day as directed by the [facility]. 45 tablet 2  . enoxaparin (LOVENOX) 120 MG/0.8ML injection Inject 0.8 mLs (120 mg total) into the skin every 12 (twelve) hours. (Patient not taking: Reported on 11/17/2018) 10 mL 3  . promethazine (PHENERGAN) 25 MG tablet Take 1 tablet (25 mg total) by mouth every 6 (six) hours as needed for nausea. (Patient not taking: Reported on 11/17/2018) 30 tablet 1   No facility-administered medications prior to visit.      ROS Review of Systems  Constitutional: Negative for activity change and appetite change.  HENT: Negative for sinus pressure and sore throat.   Eyes: Negative for visual disturbance.  Respiratory: Negative for cough, chest tightness and shortness of breath.   Cardiovascular: Negative for chest pain and leg swelling.  Gastrointestinal: Negative for abdominal distention, abdominal pain, constipation and diarrhea.  Endocrine: Negative.   Genitourinary: Negative for dysuria.  Musculoskeletal:       See hpi  Skin: Negative for rash.  Allergic/Immunologic: Negative.   Neurological: Negative for weakness, light-headedness and numbness.  Psychiatric/Behavioral: Positive for dysphoric  mood. Negative for suicidal ideas.    Objective:  BP (!) 145/99   Pulse 75   Temp (!) 97.4 F (36.3 C) (Oral)   Ht 5' 9"  (1.753 m)   Wt 280 lb 12.8 oz (127.4 kg)   SpO2 96%   BMI 41.47 kg/m   BP/Weight 11/17/2018 10/14/2018 16/06/9603  Systolic BP 540 - 981  Diastolic BP 99 - 91  Wt. (Lbs) 280.8 281 281.6  BMI 41.47 41.5 41.59      Physical Exam Constitutional:      Appearance: He is well-developed.  Cardiovascular:     Rate and Rhythm: Normal rate.     Heart sounds: Normal heart sounds. No murmur.  Pulmonary:     Effort: Pulmonary effort is normal.     Breath sounds: Normal breath sounds. No wheezing or rales.  Chest:  Chest wall: No tenderness.  Abdominal:     General: Bowel sounds are normal. There is no distension.     Palpations: Abdomen is soft. There is no mass.     Tenderness: There is no abdominal tenderness.  Musculoskeletal:     Comments: Healed vertical surgical scars in bilateral knees Tenderness on palpation of anterior left knee joint Crepitus on range of motion of bilateral knee joints with associated tenderness in the left knee  Neurological:     Mental Status: He is alert and oriented to person, place, and time.  Psychiatric:     Comments: Dysphoric mood     CMP Latest Ref Rng & Units 07/15/2018 07/14/2018 04/29/2018  Glucose 70 - 99 mg/dL 166(H) 131(H) 123(H)  BUN 6 - 20 mg/dL 6 7 15   Creatinine 0.61 - 1.24 mg/dL 0.93 1.00 1.12  Sodium 135 - 145 mmol/L 138 137 135  Potassium 3.5 - 5.1 mmol/L 3.9 3.8 4.2  Chloride 98 - 111 mmol/L 101 99 102  CO2 22 - 32 mmol/L 26 - 23  Calcium 8.9 - 10.3 mg/dL 9.2 - 8.3(L)  Total Protein 6.5 - 8.1 g/dL - - -  Total Bilirubin 0.3 - 1.2 mg/dL - - -  Alkaline Phos 38 - 126 U/L - - -  AST 15 - 41 U/L - - -  ALT 0 - 44 U/L - - -    Lipid Panel     Component Value Date/Time   CHOL 121 12/04/2017 1344   TRIG 108 12/04/2017 1344   HDL 38 (L) 12/04/2017 1344   CHOLHDL 3.2 12/04/2017 1344   CHOLHDL 4.6  12/08/2014 0942   VLDL 24 12/08/2014 0942   LDLCALC 61 12/04/2017 1344    CBC    Component Value Date/Time   WBC 12.5 (H) 10/14/2018 1044   RBC 5.83 (H) 10/14/2018 1044   HGB 16.4 10/14/2018 1044   HCT 48.2 10/14/2018 1044   PLT 211 10/14/2018 1044   MCV 82.7 10/14/2018 1044   MCH 28.1 10/14/2018 1044   MCHC 34.0 10/14/2018 1044   RDW 14.1 10/14/2018 1044   LYMPHSABS 2,713 10/14/2018 1044   MONOABS 1.1 (H) 04/18/2018 1454   EOSABS 138 10/14/2018 1044   BASOSABS 63 10/14/2018 1044    Lab Results  Component Value Date   HGBA1C 7.9 (A) 11/17/2018    Assessment & Plan:   1. Type 2 diabetes mellitus with other specified complication, without long-term current use of insulin (HCC) A1c is 7.9 which is above goal of less than 7.0 Victoza added to regimen which will be beneficial with regards to weight loss Compliance with diabetic diet cannot be ascertained - POCT glucose (manual entry) - POCT glycosylated hemoglobin (Hb A1C) - CMP14+EGFR - Lipid panel - Microalbumin / creatinine urine ratio - atorvastatin (LIPITOR) 20 MG tablet; Take 1 tablet (20 mg total) by mouth daily.  Dispense: 30 tablet; Refill: 5 - glipiZIDE (GLUCOTROL) 10 MG tablet; Take 1 tablet (10 mg total) by mouth 2 (two) times daily before a meal.  Dispense: 60 tablet; Refill: 5 - metFORMIN (GLUCOPHAGE) 500 MG tablet; Take 2 tablets (1,000 mg total) by mouth 2 (two) times daily with a meal.  Dispense: 120 tablet; Refill: 5  2. Severe single current episode of major depressive disorder, without psychotic features ([place]) Uncontrolled due to underlying psychosocial circumstances He endorsed suicidal ideations due to pending disability case, lack of income and underlying medical conditions but has no active plan. I performed psychotherapy with the patient  in the clinic given LCSW is not in the clinic at this time and he is not in crisis at this time and is not an imminent danger to himself or others. I will have the  LCSW reach out to him later this afternoon when she gets back into the clinic and we will attempt using legal aid to assist with his psychosocial issues. - FLUoxetine (PROZAC) 20 MG tablet; Take 2 tablets (40 mg total) by mouth daily.  Dispense: 60 tablet; Refill: 5  3. Neuropathy Stable - gabapentin (NEURONTIN) 300 MG capsule; Take 1 capsule (300 mg total) by mouth 3 (three) times daily.  Dispense: 90 capsule; Refill: 5  4. Essential hypertension Slightly elevated No regimen change Counseled on blood pressure goal of less than 130/80, low-sodium, DASH diet, medication compliance, 150 minutes of moderate intensity exercise per week. Discussed medication compliance, adverse effects. Reassess at next visit for improvement - hydrochlorothiazide (HYDRODIURIL) 25 MG tablet; Take 1 tablet (25 mg total) by mouth daily.  Dispense: 30 tablet; Refill: 5 - lisinopril (PRINIVIL,ZESTRIL) 10 MG tablet; Take 1 tablet (10 mg total) by mouth daily.  Dispense: 30 tablet; Refill: 5  5. 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  He is at high risk for repeated embolic events. - INR - warfarin (COUMADIN) 5 MG tablet; Take 1 and 1/2 (1.5) tablets every day as directed by the [facility].  Dispense: 45 tablet; Refill: 2  6.  Chronic bilateral knee pain Status post bilateral knee replacement with minimal improvement in symptoms Lack of medical coverage precludes PT. Encouraged to follow-up on his [place] financial application process which will facilitate a referral to PT and he is to inform me once he received notification of approval.   Meds ordered this encounter  Medications  . liraglutide (VICTOZA) 18 MG/3ML SOPN    Sig: Inject  subcut 0.69m daily with breakfast for one week then increase to 1.26mdaily for one week.    Dispense:  30 mL    Refill:  3  . atorvastatin (LIPITOR) 20 MG tablet    Sig: Take 1 tablet (20 mg total) by mouth daily.    Dispense:  30 tablet    Refill:  5  . FLUoxetine (PROZAC) 20 MG tablet    Sig: Take 2 tablets (40 mg total) by mouth daily.    Dispense:  60 tablet    Refill:  5  . gabapentin (NEURONTIN) 300 MG capsule    Sig: Take 1 capsule (300 mg total) by mouth 3 (three) times daily.    Dispense:  90 capsule    Refill:  5  . glipiZIDE (GLUCOTROL) 10 MG tablet    Sig: Take 1 tablet (10 mg total) by mouth 2 (two) times daily before a meal.    Dispense:  60 tablet    Refill:  5  . hydrochlorothiazide (HYDRODIURIL) 25 MG tablet    Sig: Take 1 tablet (25 mg total) by mouth daily.    Dispense:  30 tablet    Refill:  5  . lisinopril (PRINIVIL,ZESTRIL) 10 MG tablet    Sig: Take 1 tablet (10 mg total) by mouth daily.    Dispense:  30 tablet    Refill:  5  . metFORMIN (  GLUCOPHAGE) 500 MG tablet    Sig: Take 2 tablets (1,000 mg total) by mouth 2 (two) times daily with a meal.    Dispense:  120 tablet    Refill:  5  . warfarin (COUMADIN) 5 MG tablet    Sig: Take 1 and 1/2 (1.5) tablets every day as directed by the [facility].    Dispense:  45 tablet    Refill:  2    Follow-up: Return in about 3 months (around 02/17/2019) for Follow-up of chronic medical conditions.       Charlott Rakes, MD, FAAFP. [facility], [place]   11/17/2018, 12:13 PM

## 2018-11-18 LAB — CMP14+EGFR
A/G RATIO: 1.4 (ref 1.2–2.2)
ALT: 40 IU/L (ref 0–44)
AST: 13 IU/L (ref 0–40)
Albumin: 4.3 g/dL (ref 4.0–5.0)
Alkaline Phosphatase: 85 IU/L (ref 39–117)
BUN/Creatinine Ratio: 5 — ABNORMAL LOW (ref 9–20)
BUN: 6 mg/dL (ref 6–24)
Bilirubin Total: 0.5 mg/dL (ref 0.0–1.2)
CALCIUM: 9.8 mg/dL (ref 8.7–10.2)
CO2: 23 mmol/L (ref 20–29)
CREATININE: 1.15 mg/dL (ref 0.76–1.27)
Chloride: 98 mmol/L (ref 96–106)
GFR, EST AFRICAN AMERICAN: 86 mL/min/{1.73_m2} (ref >59)
GFR, EST NON AFRICAN AMERICAN: 74 mL/min/{1.73_m2} (ref >59)
GLOBULIN, TOTAL: 3 g/dL (ref 1.5–4.5)
Glucose: 180 mg/dL — ABNORMAL HIGH (ref 65–99)
Potassium: 4.7 mmol/L (ref 3.5–5.2)
SODIUM: 135 mmol/L (ref 134–144)
TOTAL PROTEIN: 7.3 g/dL (ref 6.0–8.5)

## 2018-11-18 LAB — MICROALBUMIN / CREATININE URINE RATIO
CREATININE, UR: 138.6 mg/dL
Microalb/Creat Ratio: 9 mg/g creat (ref 0–29)
Microalbumin, Urine: 11.8 ug/mL

## 2018-11-18 LAB — LIPID PANEL
CHOL/HDL RATIO: 4.2 ratio (ref 0.0–5.0)
Cholesterol, Total: 168 mg/dL (ref 100–199)
HDL: 40 mg/dL (ref >39)
LDL CALC: 97 mg/dL (ref 0–99)
TRIGLYCERIDES: 153 mg/dL — AB (ref 0–149)
VLDL Cholesterol Cal: 31 mg/dL (ref 5–40)

## 2018-11-20 ENCOUNTER — Other Ambulatory Visit: Payer: Self-pay

## 2018-11-20 ENCOUNTER — Inpatient Hospital Stay: Payer: Self-pay | Attending: Hematology and Oncology | Admitting: Internal Medicine

## 2018-11-20 ENCOUNTER — Telehealth: Payer: Self-pay | Admitting: Internal Medicine

## 2018-11-20 VITALS — BP 130/89 | HR 104 | Temp 98.0°F | Resp 18 | Ht 69.0 in | Wt 281.9 lb

## 2018-11-20 DIAGNOSIS — Z79899 Other long term (current) drug therapy: Secondary | ICD-10-CM | POA: Insufficient documentation

## 2018-11-20 DIAGNOSIS — E119 Type 2 diabetes mellitus without complications: Secondary | ICD-10-CM | POA: Insufficient documentation

## 2018-11-20 DIAGNOSIS — I10 Essential (primary) hypertension: Secondary | ICD-10-CM | POA: Insufficient documentation

## 2018-11-20 DIAGNOSIS — E785 Hyperlipidemia, unspecified: Secondary | ICD-10-CM | POA: Insufficient documentation

## 2018-11-20 DIAGNOSIS — Z86718 Personal history of other venous thrombosis and embolism: Secondary | ICD-10-CM | POA: Insufficient documentation

## 2018-11-20 DIAGNOSIS — Z7984 Long term (current) use of oral hypoglycemic drugs: Secondary | ICD-10-CM | POA: Insufficient documentation

## 2018-11-20 DIAGNOSIS — M1990 Unspecified osteoarthritis, unspecified site: Secondary | ICD-10-CM | POA: Insufficient documentation

## 2018-11-20 DIAGNOSIS — F329 Major depressive disorder, single episode, unspecified: Secondary | ICD-10-CM | POA: Insufficient documentation

## 2018-11-20 DIAGNOSIS — R7989 Other specified abnormal findings of blood chemistry: Secondary | ICD-10-CM | POA: Insufficient documentation

## 2018-11-20 DIAGNOSIS — K5900 Constipation, unspecified: Secondary | ICD-10-CM | POA: Insufficient documentation

## 2018-11-20 DIAGNOSIS — Z9119 Patient's noncompliance with other medical treatment and regimen: Secondary | ICD-10-CM | POA: Insufficient documentation

## 2018-11-20 DIAGNOSIS — Z7901 Long term (current) use of anticoagulants: Secondary | ICD-10-CM | POA: Insufficient documentation

## 2018-11-20 DIAGNOSIS — I82403 Acute embolism and thrombosis of unspecified deep veins of lower extremity, bilateral: Secondary | ICD-10-CM

## 2018-11-20 DIAGNOSIS — E669 Obesity, unspecified: Secondary | ICD-10-CM | POA: Insufficient documentation

## 2018-11-20 DIAGNOSIS — D573 Sickle-cell trait: Secondary | ICD-10-CM | POA: Insufficient documentation

## 2018-11-20 DIAGNOSIS — N529 Male erectile dysfunction, unspecified: Secondary | ICD-10-CM | POA: Insufficient documentation

## 2018-11-20 DIAGNOSIS — G4730 Sleep apnea, unspecified: Secondary | ICD-10-CM | POA: Insufficient documentation

## 2018-11-20 DIAGNOSIS — K219 Gastro-esophageal reflux disease without esophagitis: Secondary | ICD-10-CM | POA: Insufficient documentation

## 2018-11-20 NOTE — Telephone Encounter (Signed)
Gave avs and calendar ° °

## 2018-11-20 NOTE — Progress Notes (Signed)
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  Assessment and Plan:  1.  Left LE DVT.  Pt has history of DVT dating back to 2015.  Initially pt had DVT in RLL.  Recent doppler done July 15, 2018 showed:   Impression: Right: There is no evidence of deep vein thrombosis in the lower extremity. No cystic structure found in the popliteal fossa. Left: Findings consistent with acute deep vein thrombosis involving the left gastrocnemius vein. 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 No cystic structure found in the popliteal fossa.  Pt is currently on Coumadin after reported Xarelto failure.  Labs done 11/17/2018 showed INR 1.  When questioned, pt reports he ran out of medication but reports he has taken it daily up to that point.  When questioned, he has not been taking the medication at the same time daily.  Review of records indicate problems with suspected noncompliance in the past.  I discussed with pt to resume coumadin as directed and try to take the medication consistently at the same time daily.  He should avoid missing doses.  He will be set up for repeat bilateral LE doppler for interval evaluation.  If any progression of thrombus he will be recommended for Lovenox.  Pt will have repeat labs in 2-3 weeks and will follow-up to go over dopplers.  Pt should continue coumadin clinic follow-up.    2.  Obesity.  Have also discussed risk factors for thrombosis.  Follow-up with PCP for weight management options.    3.  Hypertension. BP is 130/89.  Follow-up with PCP for monitoring.    4.  Noncompliance.  There is reported history of noncompliance with medication.  When questioned,  pt reports he ran out of medication but reports he has taken it daily up to that point.  When questioned, he has not been taking the medication at the same time daily.  Review of records indicate problems with suspected noncompliance in the past.  I discussed with pt to resume coumadin as directed and try to take the medication consistently at the same time daily.  He should avoid missing doses.  He will be set up for repeat bilateral LE doppler for interval evaluation.  If any progression of thrombus he will be recommended for Lovenox.  Pt will have repeat labs in 2-3 weeks and will follow-up to go over dopplers.    25 minutes spent with more than 50% spent in review of records, counseling and coordination of care.     Doppler imaging history:    October 28, 2015 Findings consistent with non-obstructing chronic appearing thrombosis involving the left peroneal veins. There is resolution of prior deep vein thrombosis in the left posterior tibial veins.  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 Findings consistent with acute deep vein thrombosis involving the left posterial tibial vein.  Tinnie Gens  May 28, 2014 Findings consistent with subacute deep vein thrombosis involving the right femoral vein, right popliteal vein, and right peroneal vein.  Adele Barthel, MD  March 18, 2014 Findings consistent with acute deep vein thrombosis involving the femoral, popliteal, peroneal, and posterior tibial veins of the right lower extremity.  Brian Chen, MD  Interval History:   Historical data obtained from note dated 08/06/2018.  48 year old male previously followed by Dr. Ruben. On March 18, 2014 he presented to the emergency department at [facility] complaining of a 3-week history of right lower leg pain.  The pain started after he injured his right leg when stepping out of a truck 3 weeks ago.  He initially  went to urgent care and was told he had a "muscle strain of his calf."  He had no chest pain or dyspnea.  A duplex venous Doppler was performed which revealed an acute deep vein thrombosis involving the right femoral, popliteal, peroneal, and posterior tibial veins.  He was started on anticoagulation with rivaroxaban.  On May 28, 2014 he presented once again to the emergency department at [facility] complaining of worsening right lower extremity pain.  According to the record, he started rivaroxaban but did not continue it for a period of at least 3 weeks.  A duplex venous Doppler of the right lower extremity was again performed.  At that time there was evidence of a subacute deep vein thrombosis involving the right femoral, right popliteal, and right peroneal veins.  For the outpatient [facility] pharmacy, rivaroxaban was made available.  An appointment was scheduled with cardiology (Dr. Wall) on June 09, 2014.  Apparently samples were provided on an interim basis.  On November 16, 2014 he presented again to the emergency department at [facility].  At this time he was complaining of worsening bilateral lower extremity swelling and pain from his knees to his ankles.  He has been taking ibuprofen for pain relief.  "He states he is currently out of his prescribed anticoagulant, but was unable to afford to get the prescription refilled."  His last dose was apparently 2 months earlier.  A bilateral lower extremity duplex venous Doppler was performed on November 16, 2013.  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  There was on the left and acute deep vein thrombosis involving the left posterior tibial vein.  At that time he met with social service and was given information regarding "an orange card," walk-in times and medication to receive once he completed the required paperwork.  At that time he verbalized understanding and follow-up instructions.  On  December 17, 2014 he presented to the emergency department after stepping out his front door and missing the first step causing his ankle to roll.  He felt a popping sensation immediately tenderness and swelling began.  Although he took Percocet for pain relief, the pain and swelling persisted to the time of presentation.  An x-ray revealed diffuse soft tissue swelling without evidence of fracture dislocation or ankle joint effusion.  On October 11, 2015 he presented to the emergency department again complaining of productive cough with green-yellow sputum.  Those findings were felt to be consistent with bronchitis since he had at that time a leukocytosis.  He was treated with azithromycin.  At that time he had no dyspnea and appeared to be compliant with rivaroxaban.  Several days later on October 28, 2015 he presented again with left leg pain in the emergency department at [facility].  The pain is worse with activity and walking.  He also noticed some increased swelling of the left leg.  A left lower extremity duplex venous Doppler was performed.  Those findings were consistent with a nonobstructing chronic appearing thrombus involving the left peroneal vein.    There was resolution of the prior deep vein thrombosis in the left posterior tibial veins.  Left plantar fasciitis was suspected and he was started on prednisone.  Ice treatment and massage was recommended also.  On April 28, 2018 he was admitted with localized osteoarthritis right is of the left knee associated with severe unremitting pain that affected his sleep, daily activities, and work.  A total left knee arthroplasty was performed by Dr. Frankey Shown, orthopedic surgery.  He apparently obtained considerable relief following his surgery.  He was discharged on April 30, 2018.   On May 14, 2018 he presented to the emergency department at [facility], complaining of pain in both knees.  There was a slight amount of swelling in the right compared with  the left.  There are chronic appearing skin changes in the right lower leg consistent with venous stasis.  His pulses were intact bilaterally.  There is normal plantar/dorsiflexion.  There was no evidence of cellulitis or septic joint.  He reportedly had not completed all of his physical therapy and there was some evidence of muscle atrophy. Outpatient duplex Dopplers were recommended and performed the following day.  Results of that study from July 15, 2018 revealed no evidence of deep vein thrombosis in the right lower extremity.  No cystic structure was found in the popliteal fossa.  On the left findings were consistent with an acute deep vein thrombosis involving the left gastrocnemius vein.  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  No cystic structure was found in the popliteal fossa.  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  He has a history of venous thromboembolic disease dating back to 2015.  There is a vague history of "blood clots" in 1 of his sisters.   Current Status:  Pt is seen today for follow-up.  He reports some pain and swelling in LLE.  He is on coumadin and is being monitored by Dr. Margarita Rana.    Problem List Patient Active Problem List   Diagnosis Date Noted  . Morbid obesity ([place]) [E66.01] 07/22/2018  . Body mass index 40.0-44.9, adult ([place]) [Z68.41] 07/22/2018  . S/P total knee replacement, left [Z96.652] 04/28/2018  . Primary osteoarthritis of left knee [M17.12]   . Unilateral primary osteoarthritis, left knee [M17.12] 04/10/2018  . Obstructive sleep apnea [G47.33] 09/05/2017  . S/P TKR (total knee replacement), right [Z96.651] 04/17/2017  . GERD  (gastroesophageal reflux disease) [K21.9] 11/27/2016  . S/p total knee replacement, bilateral [Z96.653] 10/18/2016  . Neuropathy [G62.9] 07/25/2016  . Acute cholecystitis [K81.0] 06/05/2016  . Diabetes mellitus without complication ([place]) [N02.7] 05/18/2016  . Cholelithiasis [K80.20] 04/09/2016  . Major depressive disorder, single episode [F32.9] 04/09/2016  . Leg DVT (deep venous thromboembolism), chronic ([place]) [I82.509] 12/08/2014  . Family history of diabetes mellitus (DM) [Z83.3] 12/08/2014  . Moderate major depression, single episode ([place]) [F32.1] 08/22/2011  . Constipation [564.0] 11/22/2010  . ANKLE EDEMA [R60.9] 10/19/2010  . Essential hypertension [I10] 10/12/2010    Past Medical History Past Medical History:  Diagnosis Date  . Arthritis   . Depression   . Diabetes mellitus without complication ([place]) 25/3664   NEW ONSET    WITH FAMILY HISTORY  . DVT (deep venous thrombosis) ([place])   . Essential hypertension   . Gallstones 05/2016  .  GERD (gastroesophageal reflux disease)   . Sickle cell anemia ([facility])    Sickle trait  . Sleep apnea    Cpap    Past Surgical History Past Surgical History:  Procedure Laterality Date  . CHOLECYSTECTOMY N/A 06/07/2016   Procedure: LAPAROSCOPIC CHOLECYSTECTOMY WITH ATTEMPTED INTRAOPERATIVE CHOLANGIOGRAM;  Surgeon: Burke Thompson, MD;  Location: [facility] OR;  Service: General;  Laterality: N/A;  . LIGAMENT REPAIR     R forearm  . TOTAL KNEE ARTHROPLASTY Right 04/17/2017   Procedure: RIGHT TOTAL KNEE ARTHROPLASTY;  Surgeon: Xu, Naiping M, MD;  Location: [facility] OR;  Service: Orthopedics;  Laterality: Right;  . TOTAL KNEE ARTHROPLASTY Left 04/28/2018   Procedure: LEFT TOTAL KNEE ARTHROPLASTY;  Surgeon: Xu, Naiping M, MD;  Location: [facility] OR;  Service: Orthopedics;  Laterality: Left;    Family History Family History  Problem Relation Age of Onset  . Diabetes Mother   . Diabetes Father   . Hypertension Sister      Social History  reports that he has  never smoked. He has never used smokeless tobacco. He reports current alcohol use of about 1.0 - 2.0 standard drinks of alcohol per week. He reports that he does not use drugs.  Medications  Current Outpatient Medications:  .  atorvastatin (LIPITOR) 20 MG tablet, Take 1 tablet (20 mg total) by mouth daily., Disp: 30 tablet, Rfl: 5 .  Blood Glucose Monitoring Suppl (TRUE METRIX METER) DEVI, 1 each by Does not apply route 3 (three) times daily before meals., Disp: 1 Device, Rfl: 0 .  cetirizine (ZYRTEC) 10 MG tablet, TAKE 1 TABLET BY MOUTH DAILY. (Patient taking differently: Take 10 mg by mouth daily. ), Disp: 30 tablet, Rfl: 1 .  FLUoxetine (PROZAC) 20 MG tablet, Take 2 tablets (40 mg total) by mouth daily., Disp: 60 tablet, Rfl: 5 .  gabapentin (NEURONTIN) 300 MG capsule, Take 1 capsule (300 mg total) by mouth 3 (three) times daily., Disp: 90 capsule, Rfl: 5 .  glipiZIDE (GLUCOTROL) 10 MG tablet, Take 1 tablet (10 mg total) by mouth 2 (two) times daily before a meal., Disp: 60 tablet, Rfl: 5 .  glucose blood (TRUE METRIX BLOOD GLUCOSE TEST) test strip, Used daily before meals, Disp: 100 each, Rfl: 12 .  hydrochlorothiazide (HYDRODIURIL) 25 MG tablet, Take 1 tablet (25 mg total) by mouth daily., Disp: 30 tablet, Rfl: 5 .  HYDROcodone-acetaminophen (NORCO) 5-325 MG tablet, Take 1 tablet by mouth 2 (two) times daily as needed for moderate pain., Disp: 14 tablet, Rfl: 0 .  liraglutide (VICTOZA) 18 MG/3ML SOPN, Inject subcut 0.6mg daily with breakfast for one week then increase to 1.2mg daily for one week., Disp: 30 mL, Rfl: 3 .  lisinopril (PRINIVIL,ZESTRIL) 10 MG tablet, Take 1 tablet (10 mg total) by mouth daily., Disp: 30 tablet, Rfl: 5 .  metFORMIN (GLUCOPHAGE) 500 MG tablet, Take 2 tablets (1,000 mg total) by mouth 2 (two) times daily with a meal., Disp: 120 tablet, Rfl: 5 .  methocarbamol (ROBAXIN) 750 MG tablet, Take 1 tablet (750 mg total) by mouth 2 (two) times daily as needed for muscle  spasms., Disp: 60 tablet, Rfl: 1 .  sildenafil (VIAGRA) 50 MG tablet, Take 50 mg by mouth daily as needed for erectile dysfunction (at least 24 hours between doses)., Disp: , Rfl:  .  traMADol (ULTRAM) 50 MG tablet, Take 1 tablet (50 mg total) by mouth every 12 (twelve) hours as needed. 1-2 pills every 6 hours as needed for pain, Disp: 60 tablet, Rfl:   1 .  TRUEPLUS LANCETS 28G MISC, Use daily before meals, Disp: 30 each, Rfl: 12 .  warfarin (COUMADIN) 5 MG tablet, Take 1 and 1/2 (1.5) tablets every day as directed by the Coumadin Clinic., Disp: 45 tablet, Rfl: 2  Allergies Patient has no known allergies.  Review of Systems Review of Systems - Oncology ROS negative   Physical Exam  Vitals Wt Readings from Last 3 Encounters:  11/20/18 281 lb 14.4 oz (127.9 kg)  11/17/18 280 lb 12.8 oz (127.4 kg)  10/14/18 281 lb (127.5 kg)   Temp Readings from Last 3 Encounters:  11/20/18 98 F (36.7 C) (Oral)  11/17/18 (!) 97.4 F (36.3 C) (Oral)  08/12/18 98.7 F (37.1 C) (Oral)   BP Readings from Last 3 Encounters:  11/20/18 130/89  11/17/18 (!) 145/99  08/12/18 (!) 131/91   Pulse Readings from Last 3 Encounters:  11/20/18 (!) 104  11/17/18 75  08/12/18 90   Constitutional: Well-developed, well-nourished, and in no distress.   HENT: Head: Normocephalic and atraumatic.  Mouth/Throat: No oropharyngeal exudate. Mucosa moist. Eyes: Pupils are equal, round, and reactive to light. Conjunctivae are normal. No scleral icterus.  Neck: Normal range of motion. Neck supple. No JVD present.  Cardiovascular: Normal rate, regular rhythm and normal heart sounds.  Exam reveals no gallop and no friction rub.   No murmur heard. Pulmonary/Chest: Effort normal and breath sounds normal. No respiratory distress. No wheezes.No rales.  Abdominal: Soft. Bowel sounds are normal. No distension. There is no tenderness. There is no guarding.  Musculoskeletal: No edema.  Scar over left knee from prior surgery.   Mild tenderness reported.   Lymphadenopathy: No cervical, axillary or supraclavicular adenopathy.  Neurological: Alert and oriented to person, place, and time. No cranial nerve deficit.  Skin: Skin is warm and dry. No rash noted. No erythema. No pallor.  Psychiatric: Affect and judgment normal.   Labs No visits with results within 3 Day(s) from this visit.  Latest known visit with results is:  Office Visit on 11/17/2018  Component Date Value Ref Range Status  . POC Glucose 11/17/2018 204* 70 - 99 mg/dl Final  . HbA1c, POC (controlled diabetic ra* 11/17/2018 7.9* 0.0 - 7.0 % Final  . Glucose 11/17/2018 180* 65 - 99 mg/dL Final  . BUN 11/17/2018 6  6 - 24 mg/dL Final  . Creatinine, Ser 11/17/2018 1.15  0.76 - 1.27 mg/dL Final  . GFR calc non Af Amer 11/17/2018 74  >59 mL/min/1.73 Final  . GFR calc Af Amer 11/17/2018 86  >59 mL/min/1.73 Final  . BUN/Creatinine Ratio 11/17/2018 5* 9 - 20 Final  . Sodium 11/17/2018 135  134 - 144 mmol/L Final  . Potassium 11/17/2018 4.7  3.5 - 5.2 mmol/L Final  . Chloride 11/17/2018 98  96 - 106 mmol/L Final  . CO2 11/17/2018 23  20 - 29 mmol/L Final  . Calcium 11/17/2018 9.8  8.7 - 10.2 mg/dL Final  . Total Protein 11/17/2018 7.3  6.0 - 8.5 g/dL Final  . Albumin 11/17/2018 4.3  4.0 - 5.0 g/dL Final  . Globulin, Total 11/17/2018 3.0  1.5 - 4.5 g/dL Final  . Albumin/Globulin Ratio 11/17/2018 1.4  1.2 - 2.2 Final  . Bilirubin Total 11/17/2018 0.5  0.0 - 1.2 mg/dL Final  . Alkaline Phosphatase 11/17/2018 85  39 - 117 IU/L Final  . AST 11/17/2018 13  0 - 40 IU/L Final  . ALT 11/17/2018 40  0 - 44 IU/L Final  . Cholesterol, Total  11/17/2018 168  100 - 199 mg/dL Final  . Triglycerides 11/17/2018 153* 0 - 149 mg/dL Final  . HDL 11/17/2018 40  >39 mg/dL Final  . VLDL Cholesterol Cal 11/17/2018 31  5 - 40 mg/dL Final  . LDL Calculated 11/17/2018 97  0 - 99 mg/dL Final  . Chol/HDL Ratio 11/17/2018 4.2  0.0 - 5.0 ratio Final   Comment:                                    T. Chol/HDL Ratio                                             Men  Women                               1/2 Avg.Risk  3.4    3.3                                   Avg.Risk  5.0    4.4                                2X Avg.Risk  9.6    7.1                                3X Avg.Risk 23.4   11.0   . Creatinine, Urine 11/17/2018 138.6  Not Estab. mg/dL Final  . Microalbumin, Urine 11/17/2018 11.8  Not Estab. ug/mL Final  . Microalb/Creat Ratio 11/17/2018 9  0 - 29 mg/g creat Final   Comment:                        Normal:                0 -  29                        Moderately increased: 30 - 300                        Severely increased:       >300               **Please note reference interval change**   . INR 11/17/2018 1.0* 2.0 - 3.0 Final     Pathology Orders Placed This Encounter  Procedures  . CBC with Differential (Cancer Center Only)    Standing Status:   Future    Standing Expiration Date:   11/20/2019  . CMP (Cancer Center only)    Standing Status:   Future    Standing Expiration Date:   11/20/2019  . Lactate dehydrogenase (LDH)    Standing Status:   Future    Standing Expiration Date:   11/20/2019  . Protime-INR    Standing Status:   Future    Standing Expiration Date:   11/20/2019       Vetta Higgs MD 

## 2018-11-21 ENCOUNTER — Telehealth: Payer: Self-pay | Admitting: Internal Medicine

## 2018-11-21 ENCOUNTER — Telehealth: Payer: Self-pay

## 2018-11-21 ENCOUNTER — Telehealth: Payer: Self-pay | Admitting: *Deleted

## 2018-11-21 NOTE — Telephone Encounter (Signed)
Received vm message from patient inquiring about his vascular study appt.  TCT patient and spoke with him and informed him and date/time/place for his vascular study. Pt voiced understanding. No further questions or concerns

## 2018-11-21 NOTE — Telephone Encounter (Signed)
Called patient with doppler appt time and date. No answer. Left message.

## 2018-11-21 NOTE — Telephone Encounter (Signed)
Patient was called and informed of lab results. 

## 2018-11-21 NOTE — Telephone Encounter (Signed)
-----   Message from Hoy Register, MD sent at 11/18/2018  8:16 AM EST ----- Please inform the patient that labs are normal. Thank you.

## 2018-11-24 ENCOUNTER — Ambulatory Visit: Payer: Self-pay | Attending: Family Medicine | Admitting: Pharmacist

## 2018-11-24 DIAGNOSIS — I82509 Chronic embolism and thrombosis of unspecified deep veins of unspecified lower extremity: Secondary | ICD-10-CM

## 2018-11-24 LAB — POCT INR: INR: 1.3 — AB (ref 2.0–3.0)

## 2018-11-25 ENCOUNTER — Other Ambulatory Visit (HOSPITAL_COMMUNITY): Payer: Self-pay | Admitting: Internal Medicine

## 2018-11-25 ENCOUNTER — Telehealth: Payer: Self-pay | Admitting: *Deleted

## 2018-11-25 ENCOUNTER — Ambulatory Visit (HOSPITAL_COMMUNITY)
Admission: RE | Admit: 2018-11-25 | Discharge: 2018-11-25 | Disposition: A | Discharge status: Home / Self Care | Payer: Self-pay | Source: Ambulatory Visit | Attending: Internal Medicine | Admitting: Internal Medicine

## 2018-11-25 DIAGNOSIS — I82403 Acute embolism and thrombosis of unspecified deep veins of lower extremity, bilateral: Secondary | ICD-10-CM | POA: Insufficient documentation

## 2018-11-25 MED ORDER — ENOXAPARIN SODIUM 120 MG/0.8ML ~~LOC~~ SOLN: 120.0000 mg | Freq: Two times a day (BID) | SUBCUTANEOUS | 1 refills | Status: DC

## 2018-11-25 MED FILL — ENOXAPARIN 120 MG/0.8 ML SY: 120 | 15 days supply | Qty: 24 | Fill #0

## 2018-11-25 NOTE — Progress Notes (Signed)
Bilateral lower extremity venous duplex has been completed. Preliminary results can be found in CV Proc through chart review.  Results were given to Dr. Melton Alar.  11/25/18 10:31 AM Olen Cordial RVT

## 2018-11-25 NOTE — Telephone Encounter (Signed)
Pt returned nurse call.   Per pt, he had given Lovenox self injections  before.  Informed pt that Dr. Melton Alar reviewed doppler study, and had sent in script for Lovenox to his pharmacy.  Instructed pt to start the injections today  every 12 hours as prescribed by Dr. Melton Alar.  Pt voiced understanding.

## 2018-11-25 NOTE — Telephone Encounter (Signed)
Received message from B. Tracey,RN for Dr. Melton Alar re:  Pt will need Lovenox teaching today due to new DVT findings.  Called pt and left message on voice mail requesting a call back to nurse today by 4 pm.

## 2018-11-26 ENCOUNTER — Other Ambulatory Visit: Payer: Self-pay

## 2018-11-26 ENCOUNTER — Ambulatory Visit: Payer: Self-pay | Attending: Family Medicine | Admitting: Licensed Clinical Social Worker

## 2018-11-26 ENCOUNTER — Ambulatory Visit: Payer: Self-pay | Attending: Family Medicine

## 2018-11-26 DIAGNOSIS — F331 Major depressive disorder, recurrent, moderate: Secondary | ICD-10-CM

## 2018-11-26 DIAGNOSIS — F419 Anxiety disorder, unspecified: Secondary | ICD-10-CM

## 2018-11-26 MED FILL — SILDENAFIL CITRATE 50 MG TA: 50 | 30 days supply | Qty: 10 | Fill #4

## 2018-11-28 NOTE — BH Specialist Note (Signed)
Integrated Behavioral Health Initial Visit  MRN: [id number] Name: David Dunlap  Number of Integrated Behavioral Health Clinician visits:: 1/6 Session Start time: 10:30 AM  Session End time: 11:00 AM Total time: 30 minutes  Type of Service: Integrated Behavioral Health- Individual Interpretor:No. Interpretor Name and Language: NA   SUBJECTIVE: David Dunlap is a 48 y.o. male accompanied by self Patient was referred by Dr. Alvis Lemmings for depression. Patient reports the following symptoms/concerns: Pt has difficulty managing mental and physical health. He is experiencing financial strain and chronic pain Duration of problem: Ongoing; Severity of problem: severe  OBJECTIVE: Mood: Anxious and Affect: Depressed Risk of harm to self or others: No plan to harm self or others Pt scored positive on screening (11/17/2018) Protective factors identified, safety plan discussed, and crisis intervention resources provided  LIFE CONTEXT: Family and Social: Pt continues to reside with a friend. He is not in contact with mother of two minor children since December 2019.  School/Work: Pt has been approved for CAFA (100%) [employer] with no copay. He was recently denied SSI and is in the appeal process with lawyer Self-Care: Pt has difficulty complying with medication management due to financial strain.  Life Changes: Pt has difficulty managing mental and physical health. He is experiencing financial strain and chronic pain  GOALS ADDRESSED: Patient will: 1. Reduce symptoms of: anxiety and depression 2. Increase knowledge and/or ability of: self-management skills  3. Demonstrate ability to: Increase healthy adjustment to current life circumstances, Increase motivation to adhere to plan of care and Improve medication compliance  INTERVENTIONS: Interventions utilized: Solution-Focused Strategies, Supportive Counseling and Link to [facility]  Standardized Assessments completed:  Not Needed  ASSESSMENT: Patient currently experiencing depression and anxiety triggered by difficulty managing ongoing medical conditions and chronic pain. Due to financial strain, he has difficultly preparing healthy foods and affording medications. Pt reports hx of suicidal ideations with no plan or intent to harm self. Protective factors were identified, safety plan discussed, and crisis intervention resources provided.    Patient may benefit from psychotherapy. He participates in medication management through PCP; however, feels medication is not working. He has a Clinical research associate to assist with disability appeal and was recently approved for CAFA, [employer], and [employer]. Therapeutic interventions were discussed to assist with decreasing and/or managing symptoms. Supportive resources for food insecurity and transportation (bus passes) were provided.   PLAN: 1. Follow up with behavioral health clinician on : Pt was encouraged to contact [facility] if symptoms worsen or fail to improve to schedule behavioral appointments at [facility]. 2. Behavioral recommendations: LCSWA recommends that pt apply healthy coping skills discussed and utilize provided resources. Pt is encouraged to schedule follow up appointment with [facility] 3. Referral(s): Integrated Art gallery manager (In Clinic) and [employer]:  Engineer, water 4. "From scale of 1-10, how likely are you to follow plan?":   Bridgett Larsson, LCSW 11/28/2018 2:19 PM

## 2018-12-01 ENCOUNTER — Other Ambulatory Visit (INDEPENDENT_AMBULATORY_CARE_PROVIDER_SITE_OTHER): Payer: Self-pay

## 2018-12-01 ENCOUNTER — Ambulatory Visit: Payer: Self-pay | Attending: Family Medicine | Admitting: Pharmacist

## 2018-12-01 ENCOUNTER — Other Ambulatory Visit: Payer: Self-pay

## 2018-12-01 DIAGNOSIS — M25562 Pain in left knee: Principal | ICD-10-CM

## 2018-12-01 DIAGNOSIS — I82509 Chronic embolism and thrombosis of unspecified deep veins of unspecified lower extremity: Secondary | ICD-10-CM

## 2018-12-01 DIAGNOSIS — G8929 Other chronic pain: Secondary | ICD-10-CM

## 2018-12-01 DIAGNOSIS — M25561 Pain in right knee: Principal | ICD-10-CM

## 2018-12-01 NOTE — Patient Instructions (Signed)
Continue with Lovenox twice a day.   Stop taking the warfarin (the oral tablet).   Follow-up with me 1 week after seeing Dr. Melton Alar. That date should be around April 2nd.

## 2018-12-01 NOTE — Progress Notes (Signed)
    Pharmacy Anticoagulation Clinic  Subjective: Patient presents today for INR monitoring. 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   Current dose of warfarin: 7.5 mg every day  Adherence to warfarin: yes Signs/symptoms of bleeding: none Recent changes in diet: none Recent changes in medications: none Upcoming procedures that may impact anticoagulation: none  Objective: Today's INR = not taken  Lab Results  Component Value Date   INR 1.3 (A) 11/24/2018   INR 1.0 (A) 11/17/2018   INR 1.0 (A) 09/15/2018   Assessment and Plan: Anticoagulation: discussed with PCP. Patient to be taking Lovenox only per Dr. Melton Alar. Informed patient of this and he knows to stop warfarin. He will continue with Lovenox only.   Patient verbalized understanding and was provided with written instructions. I will meet with him one week after Dr. Melton Alar' appointment to ensure that the plan has not changed.   [name], PharmD, CPP Clinical Pharmacist [facility] [phone]

## 2018-12-04 ENCOUNTER — Other Ambulatory Visit: Payer: Self-pay

## 2018-12-04 ENCOUNTER — Inpatient Hospital Stay: Payer: Self-pay

## 2018-12-04 DIAGNOSIS — I82403 Acute embolism and thrombosis of unspecified deep veins of lower extremity, bilateral: Secondary | ICD-10-CM

## 2018-12-04 LAB — CBC WITH DIFFERENTIAL (CANCER CENTER ONLY)
Abs Immature Granulocytes: 0.09 10*3/uL — ABNORMAL HIGH (ref 0.00–0.07)
Basophils Absolute: 0.1 10*3/uL (ref 0.0–0.1)
Basophils Relative: 0 %
EOS PCT: 0 %
Eosinophils Absolute: 0 10*3/uL (ref 0.0–0.5)
HCT: 45.9 % (ref 39.0–52.0)
Hemoglobin: 16.2 g/dL (ref 13.0–17.0)
Immature Granulocytes: 1 %
Lymphocytes Relative: 25 %
Lymphs Abs: 3 10*3/uL (ref 0.7–4.0)
MCH: 28.3 pg (ref 26.0–34.0)
MCHC: 35.3 g/dL (ref 30.0–36.0)
MCV: 80.2 fL (ref 80.0–100.0)
Monocytes Absolute: 1.1 10*3/uL — ABNORMAL HIGH (ref 0.1–1.0)
Monocytes Relative: 9 %
NEUTROS PCT: 65 %
Neutro Abs: 8 10*3/uL — ABNORMAL HIGH (ref 1.7–7.7)
Platelet Count: 206 10*3/uL (ref 150–400)
RBC: 5.72 MIL/uL (ref 4.22–5.81)
RDW: 13.4 % (ref 11.5–15.5)
WBC Count: 12.4 10*3/uL — ABNORMAL HIGH (ref 4.0–10.5)
nRBC: 0 % (ref 0.0–0.2)

## 2018-12-04 LAB — CMP (CANCER CENTER ONLY)
ALT: 55 U/L — ABNORMAL HIGH (ref 0–44)
AST: 13 U/L — ABNORMAL LOW (ref 15–41)
Albumin: 3.8 g/dL (ref 3.5–5.0)
Alkaline Phosphatase: 74 U/L (ref 38–126)
Anion gap: 9 (ref 5–15)
BUN: 11 mg/dL (ref 6–20)
CO2: 25 mmol/L (ref 22–32)
Calcium: 9.2 mg/dL (ref 8.9–10.3)
Chloride: 102 mmol/L (ref 98–111)
Creatinine: 1.17 mg/dL (ref 0.61–1.24)
GFR, Est AFR Am: >60 mL/min (ref >60)
GFR, Estimated: >60 mL/min (ref >60)
Glucose, Bld: 217 mg/dL — ABNORMAL HIGH (ref 70–99)
Potassium: 4.4 mmol/L (ref 3.5–5.1)
Sodium: 136 mmol/L (ref 135–145)
Total Bilirubin: 0.8 mg/dL (ref 0.3–1.2)
Total Protein: 7.5 g/dL (ref 6.5–8.1)

## 2018-12-04 LAB — PROTIME-INR
INR: 1 (ref 0.8–1.2)
Prothrombin Time: 13.4 seconds (ref 11.4–15.2)

## 2018-12-04 LAB — LACTATE DEHYDROGENASE: LDH: 235 U/L — ABNORMAL HIGH (ref 98–192)

## 2018-12-09 MED FILL — VICTOZA 2-PAK 18 MG/3 ML PE: 18 | 28 days supply | Qty: 6 | Fill #1

## 2018-12-09 MED FILL — ENOXAPARIN 120 MG/0.8 ML SY: 120 | 5 days supply | Qty: 8 | Fill #1

## 2018-12-11 ENCOUNTER — Telehealth: Payer: Self-pay | Admitting: Internal Medicine

## 2018-12-11 ENCOUNTER — Other Ambulatory Visit: Payer: Self-pay

## 2018-12-11 ENCOUNTER — Inpatient Hospital Stay (HOSPITAL_BASED_OUTPATIENT_CLINIC_OR_DEPARTMENT_OTHER): Payer: Self-pay | Admitting: Internal Medicine

## 2018-12-11 VITALS — BP 130/101 | HR 106 | Temp 98.2°F | Resp 18 | Ht 69.0 in | Wt 281.0 lb

## 2018-12-11 DIAGNOSIS — K5900 Constipation, unspecified: Secondary | ICD-10-CM

## 2018-12-11 DIAGNOSIS — R7989 Other specified abnormal findings of blood chemistry: Secondary | ICD-10-CM

## 2018-12-11 DIAGNOSIS — I82403 Acute embolism and thrombosis of unspecified deep veins of lower extremity, bilateral: Secondary | ICD-10-CM

## 2018-12-11 DIAGNOSIS — E785 Hyperlipidemia, unspecified: Secondary | ICD-10-CM

## 2018-12-11 DIAGNOSIS — N529 Male erectile dysfunction, unspecified: Secondary | ICD-10-CM

## 2018-12-11 DIAGNOSIS — Z79899 Other long term (current) drug therapy: Secondary | ICD-10-CM

## 2018-12-11 DIAGNOSIS — F329 Major depressive disorder, single episode, unspecified: Secondary | ICD-10-CM

## 2018-12-11 DIAGNOSIS — Z7901 Long term (current) use of anticoagulants: Secondary | ICD-10-CM

## 2018-12-11 DIAGNOSIS — G4730 Sleep apnea, unspecified: Secondary | ICD-10-CM

## 2018-12-11 DIAGNOSIS — I10 Essential (primary) hypertension: Secondary | ICD-10-CM

## 2018-12-11 DIAGNOSIS — Z86718 Personal history of other venous thrombosis and embolism: Secondary | ICD-10-CM

## 2018-12-11 DIAGNOSIS — Z7984 Long term (current) use of oral hypoglycemic drugs: Secondary | ICD-10-CM

## 2018-12-11 DIAGNOSIS — Z9119 Patient's noncompliance with other medical treatment and regimen: Secondary | ICD-10-CM

## 2018-12-11 DIAGNOSIS — D573 Sickle-cell trait: Secondary | ICD-10-CM

## 2018-12-11 DIAGNOSIS — M1990 Unspecified osteoarthritis, unspecified site: Secondary | ICD-10-CM

## 2018-12-11 DIAGNOSIS — E119 Type 2 diabetes mellitus without complications: Secondary | ICD-10-CM

## 2018-12-11 DIAGNOSIS — K219 Gastro-esophageal reflux disease without esophagitis: Secondary | ICD-10-CM

## 2018-12-11 DIAGNOSIS — E669 Obesity, unspecified: Secondary | ICD-10-CM

## 2018-12-11 NOTE — Progress Notes (Signed)
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  Assessment and Plan:    1.  Left LE DVT.  Pt has history of DVT dating back to 2015.  Initially pt had DVT in RLL.  Doppler done July 15, 2018 showed:   Impression: Right: There is no evidence of deep vein thrombosis in the lower extremity. No cystic structure found in the popliteal fossa. Left: Findings consistent with acute deep vein thrombosis involving the left gastrocnemius vein. 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 No cystic structure found in the popliteal fossa.  He was put on Coumadin after reported Xarelto failure.  Labs done 11/17/2018 showed INR 1.  When questioned, pt reports he ran out of medication but reports he has taken it daily up to that point.  When questioned, he has not been taking the medication at the same time daily.  Review of records indicate problems with suspected noncompliance in the past.    Labs done 12/04/2018 reviewed and showed K+ 4.4 Cr 1.17 WBC 12.4 HB 16.2 plts 206,000 INR 1.  Pt was set up for bilateral LE doppler that was done 11/25/2018 that showed  Summary: Right: Findings consistent with acute deep vein thrombosis involving the right popliteal vein. Findings consistent with age indeterminate deep vein thrombosis involving the right gastrocnemius. No cystic structure found in the popliteal fossa. Left: Findings consistent with age indeterminate deep vein thrombosis involving the left gastrocnemius vein. No cystic structure found in the popliteal fossa.  Right LE DVT was new compared to doppler done 07/15/2018 that showed Right LE was negative for DVT.    Today, I have spoken with Benard Halsted, PharmD regarding  option to give pt a trial of coumadin 7.5 mg daily to over lap with lovenox 5-7 days to determine if INR can reach goal of 2-3.  I have instructed the patient to resume Coumadin and take the medication at same time each day.  He should continue lovenox injections twice daily as directed.  He will meet with Benard Halsted, PharmD in coumadin clinic as scheduled on 12/18/2018 for INR check.  Pt reports he has coumadin at home as well as lovenox injections.  Once INR therapeutic, will look at discontinuing lovenox with emphasis on importance of compliance with Coumadin.    Due to history of noncompliance, I do not presently feel this is a coumadin failure.  He is advised to take the medication consistently at the same time daily.  He should avoid missing doses.  Pt should continue coumadin clinic follow-up.    2  Elevated LFTs.  Labs done 12/04/2018 reviewed and showed elevated AST of 55.  Pt had CT abdomen and pelvis done 11/13/2017 that showed fatty liver.  Will continue to monitor labs.    3.  Obesity.  Have also discussed risk factors for thrombosis.  Follow-up with PCP for weight management options.    4.  Hypertension. BP is 130/101. Follow-up with PCP for monitoring.  Pt reports BP usually under good control.    5.  Noncompliance.  There is reported history of noncompliance with medication.  Previously when questioned, pt reports he ran out of medication but reports he has taken it daily up to that point.  When questioned, he has not been taking  the medication at the same time daily.  Review of records indicate problems with suspected noncompliance in the past.  I have reiterated importance of taking medication consistently at the same time daily.  He should avoid missing doses.  Will continue lovenox and overlap with coumadin for now.  Pt will follow-up in coumadin clinic next week for INR check.   25 minutes spent with more than 50% spent in review of records, counseling and coordination of care.     Doppler imaging history:    October 28, 2015 Findings consistent with non-obstructing chronic appearing thrombosis involving the left peroneal veins. There is resolution of prior deep vein thrombosis in the left posterior tibial veins.  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 Findings consistent with acute deep vein thrombosis involving the left posterial tibial vein.  Tinnie Gens  May 28, 2014 Findings consistent with subacute deep vein thrombosis involving the right femoral vein, right popliteal vein, and right peroneal vein.  Adele Barthel, MD  March 18, 2014 Findings consistent with acute deep vein thrombosis involving the femoral, popliteal, peroneal, and posterior tibial veins of the right lower extremity.  Adele Barthel, MD  Interval History:   Historical data obtained from note dated 08/06/2018.  48 year old male previously followed by Dr. Audelia Hives. On March 18, 2014 he presented to the emergency department at [facility] complaining of a 3-week history of right lower leg pain.  The pain started after he injured his right leg when stepping out of a truck 3 weeks ago.  He initially went to urgent care and was told he had a "muscle strain of his calf."  He had no chest pain or dyspnea.  A duplex venous Doppler was performed which revealed an acute deep vein thrombosis involving the right femoral, popliteal, peroneal, and posterior tibial veins.  He was started on anticoagulation with rivaroxaban.  On May 28, 2014 he presented once again to the emergency department at [facility] complaining of worsening right lower extremity pain.  According to the record, he started rivaroxaban but did not continue it for a period of at least 3 weeks.  A duplex venous Doppler of the right lower extremity was again performed.  At that time there was evidence of a subacute deep vein thrombosis involving the  right femoral, right popliteal, and right peroneal veins.  For the outpatient [facility] pharmacy, rivaroxaban was made available.  An appointment was scheduled with cardiology (Dr. Verl Blalock) on June 09, 2014.  Apparently samples were provided on an interim basis.  On November 16, 2014 he presented again to the emergency department at [facility].  At this time he was complaining of worsening bilateral lower extremity swelling and pain from his knees to his ankles.  He has been taking ibuprofen for pain relief.  "He states he is currently out of his prescribed anticoagulant, but was unable to afford to get the prescription refilled."  His last dose was apparently 2 months earlier.  A bilateral lower extremity duplex venous Doppler was performed on November 16, 2013.  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  There was on the left and acute deep vein thrombosis involving the left posterior tibial vein.  At that time he met with social service and was given information regarding "an orange card," walk-in times and medication to receive once he completed the required paperwork.  At  that time he verbalized understanding and follow-up instructions.  On December 17, 2014 he presented to the emergency department after stepping out his front door and missing the first step causing his ankle to roll.  He felt a popping sensation immediately tenderness and swelling began.  Although he took Percocet for pain relief, the pain and swelling persisted to the time of presentation.  An x-ray revealed diffuse soft tissue swelling without evidence of fracture dislocation or ankle joint effusion.  On October 11, 2015 he presented to the emergency department again complaining of productive cough with green-yellow sputum.  Those findings were felt to be consistent with bronchitis since he had at that time a leukocytosis.  He was treated with azithromycin.  At that time he had no dyspnea  and appeared to be compliant with rivaroxaban.  Several days later on October 28, 2015 he presented again with left leg pain in the emergency department at [facility].  The pain is worse with activity and walking.  He also noticed some increased swelling of the left leg.  A left lower extremity duplex venous Doppler was performed.  Those findings were consistent with a nonobstructing chronic appearing thrombus involving the left peroneal vein.  There was resolution of the prior deep vein thrombosis in the left posterior tibial veins.  Left plantar fasciitis was suspected and he was started on prednisone.  Ice treatment and massage was recommended also.  On April 28, 2018 he was admitted with localized osteoarthritis right is of the left knee associated with severe unremitting pain that affected his sleep, daily activities, and work.  A total left knee arthroplasty was performed by Dr. Frankey Shown, orthopedic surgery.  He apparently obtained considerable relief following his surgery.  He was discharged on April 30, 2018.   On May 14, 2018 he presented to the emergency department at [facility], complaining of pain in both knees.  There was a slight amount of swelling in the right compared with the left.  There are chronic appearing skin changes in the right lower leg consistent with venous stasis.  His pulses were intact bilaterally.  There is normal plantar/dorsiflexion.  There was no evidence of cellulitis or septic joint.  He reportedly had not completed all of his physical therapy and there was some evidence of muscle atrophy. Outpatient duplex Dopplers were recommended and performed the following day.  Results of that study from July 15, 2018 revealed no evidence of deep vein thrombosis in the right lower extremity.  No cystic structure was found in the popliteal fossa.  On the left findings were consistent with an acute deep vein thrombosis involving the left gastrocnemius vein.  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  No cystic structure was found in the popliteal fossa.  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  He has a history of venous thromboembolic disease dating back to 2015.  There is a vague history of "blood clots" in 1 of his sisters.   Current Status:  Pt is seen today for follow-up.  He reports he is taking lovenox twice a day as directed.    Problem List Patient Active Problem List   Diagnosis Date Noted  . Morbid obesity ([place]) [E66.01] 07/22/2018  . Body mass index 40.0-44.9, adult ([place]) [Z68.41] 07/22/2018  .  S/P total knee replacement, left [Z96.652] 04/28/2018  . Primary osteoarthritis of left knee [M17.12]   . Unilateral primary osteoarthritis, left knee [M17.12] 04/10/2018  . Obstructive sleep apnea [G47.33] 09/05/2017  . S/P TKR (total knee replacement), right [Z96.651] 04/17/2017  . GERD (gastroesophageal reflux disease) [K21.9] 11/27/2016  . S/p total knee replacement, bilateral [Z96.653] 10/18/2016  . Neuropathy [G62.9] 07/25/2016  . Acute cholecystitis [K81.0] 06/05/2016  . Diabetes mellitus without complication ([place]) [C58.8] 05/18/2016  . Cholelithiasis [K80.20] 04/09/2016  . Major depressive disorder, single episode [F32.9] 04/09/2016  . Leg DVT (deep venous thromboembolism), chronic ([place]) [I82.509] 12/08/2014  . Family history of diabetes mellitus (DM) [Z83.3] 12/08/2014  . Moderate major depression, single episode ([place]) [F32.1] 08/22/2011  . Constipation [564.0] 11/22/2010  . ANKLE EDEMA [R60.9] 10/19/2010  . Essential hypertension [I10] 10/12/2010    Past Medical History Past Medical History:  Diagnosis Date  . Arthritis    . Depression   . Diabetes mellitus without complication ([place]) 50/2774   NEW ONSET    WITH FAMILY HISTORY  . DVT (deep venous thrombosis) ([place])   . Essential hypertension   . Gallstones 05/2016  . GERD (gastroesophageal reflux disease)   . Sickle cell anemia (HCC)    Sickle trait  . Sleep apnea    Cpap    Past Surgical History Past Surgical History:  Procedure Laterality Date  . CHOLECYSTECTOMY N/A 06/07/2016   Procedure: LAPAROSCOPIC CHOLECYSTECTOMY WITH ATTEMPTED INTRAOPERATIVE CHOLANGIOGRAM;  Surgeon: Georganna Skeans, MD;  Location: [facility];  Service: General;  Laterality: N/A;  . LIGAMENT REPAIR     R forearm  . TOTAL KNEE ARTHROPLASTY Right 04/17/2017   Procedure: RIGHT TOTAL KNEE ARTHROPLASTY;  Surgeon: Leandrew Koyanagi, MD;  Location: [facility];  Service: Orthopedics;  Laterality: Right;  . TOTAL KNEE ARTHROPLASTY Left 04/28/2018   Procedure: LEFT TOTAL KNEE ARTHROPLASTY;  Surgeon: Leandrew Koyanagi, MD;  Location: [facility];  Service: Orthopedics;  Laterality: Left;    Family History Family History  Problem Relation Age of Onset  . Diabetes Mother   . Diabetes Father   . Hypertension Sister      Social History  reports that he has never smoked. He has never used smokeless tobacco. He reports current alcohol use of about 1.0 - 2.0 standard drinks of alcohol per week. He reports that he does not use drugs.  Medications  Current Outpatient Medications:  .  atorvastatin (LIPITOR) 20 MG tablet, Take 1 tablet (20 mg total) by mouth daily., Disp: 30 tablet, Rfl: 5 .  Blood Glucose Monitoring Suppl (TRUE METRIX METER) DEVI, 1 each by Does not apply route 3 (three) times daily before meals., Disp: 1 Device, Rfl: 0 .  cetirizine (ZYRTEC) 10 MG tablet, TAKE 1 TABLET BY MOUTH DAILY. (Patient taking differently: Take 10 mg by mouth daily. ), Disp: 30 tablet, Rfl: 1 .  enoxaparin (LOVENOX) 120 MG/0.8ML injection, Inject 0.8 mLs (120 mg total) into the skin every 12 (twelve) hours., Disp: 60 Syringe,  Rfl: 1 .  FLUoxetine (PROZAC) 20 MG tablet, Take 2 tablets (40 mg total) by mouth daily., Disp: 60 tablet, Rfl: 5 .  gabapentin (NEURONTIN) 300 MG capsule, Take 1 capsule (300 mg total) by mouth 3 (three) times daily., Disp: 90 capsule, Rfl: 5 .  glipiZIDE (GLUCOTROL) 10 MG tablet, Take 1 tablet (10 mg total) by mouth 2 (two) times daily before a meal., Disp: 60 tablet, Rfl: 5 .  glucose blood (TRUE METRIX BLOOD GLUCOSE TEST) test strip, Used  daily before meals, Disp: 100 each, Rfl: 12 .  hydrochlorothiazide (HYDRODIURIL) 25 MG tablet, Take 1 tablet (25 mg total) by mouth daily., Disp: 30 tablet, Rfl: 5 .  HYDROcodone-acetaminophen (NORCO) 5-325 MG tablet, Take 1 tablet by mouth 2 (two) times daily as needed for moderate pain., Disp: 14 tablet, Rfl: 0 .  liraglutide (VICTOZA) 18 MG/3ML SOPN, Inject subcut 0.9m daily with breakfast for one week then increase to 1.229mdaily for one week., Disp: 30 mL, Rfl: 3 .  lisinopril (PRINIVIL,ZESTRIL) 10 MG tablet, Take 1 tablet (10 mg total) by mouth daily., Disp: 30 tablet, Rfl: 5 .  metFORMIN (GLUCOPHAGE) 500 MG tablet, Take 2 tablets (1,000 mg total) by mouth 2 (two) times daily with a meal., Disp: 120 tablet, Rfl: 5 .  methocarbamol (ROBAXIN) 750 MG tablet, Take 1 tablet (750 mg total) by mouth 2 (two) times daily as needed for muscle spasms., Disp: 60 tablet, Rfl: 1 .  sildenafil (VIAGRA) 50 MG tablet, Take 50 mg by mouth daily as needed for erectile dysfunction (at least 24 hours between doses)., Disp: , Rfl:  .  traMADol (ULTRAM) 50 MG tablet, Take 1 tablet (50 mg total) by mouth every 12 (twelve) hours as needed. 1-2 pills every 6 hours as needed for pain, Disp: 60 tablet, Rfl: 1 .  TRUEPLUS LANCETS 28G MISC, Use daily before meals, Disp: 30 each, Rfl: 12 .  warfarin (COUMADIN) 5 MG tablet, Take 1 and 1/2 (1.5) tablets every day as directed by the Coumadin Clinic. (Patient not taking: Reported on 12/11/2018), Disp: 45 tablet, Rfl: 2  Allergies Patient  has no known allergies.  Review of Systems Review of Systems - Oncology ROS negative   Physical Exam  Vitals Wt Readings from Last 3 Encounters:  12/11/18 281 lb (127.5 kg)  11/20/18 281 lb 14.4 oz (127.9 kg)  11/17/18 280 lb 12.8 oz (127.4 kg)   Temp Readings from Last 3 Encounters:  12/11/18 98.2 F (36.8 C) (Oral)  11/20/18 98 F (36.7 C) (Oral)  11/17/18 (!) 97.4 F (36.3 C) (Oral)   BP Readings from Last 3 Encounters:  12/11/18 (!) 130/101  11/20/18 130/89  11/17/18 (!) 145/99   Pulse Readings from Last 3 Encounters:  12/11/18 (!) 106  11/20/18 (!) 104  11/17/18 75   Constitutional: Obese male in no distress.   HENT: Head: Normocephalic and atraumatic.  Mouth/Throat: No oropharyngeal exudate. Mucosa moist. Eyes: Pupils are equal, round, and reactive to light. Conjunctivae are normal. No scleral icterus.  Neck: Normal range of motion. Neck supple. No JVD present.  Cardiovascular: Normal rate, regular rhythm and normal heart sounds.  Exam reveals no gallop and no friction rub.   No murmur heard. Pulmonary/Chest: Effort normal and breath sounds normal. No respiratory distress. No wheezes.No rales.  Abdominal: Soft. Bowel sounds are normal. No distension. There is no tenderness. There is no guarding.  Musculoskeletal: No edema or tenderness.  Lymphadenopathy: No cervical, axillary or supraclavicular adenopathy.  Neurological: Alert and oriented to person, place, and time. No cranial nerve deficit.  Skin: Skin is warm and dry. No rash noted. No erythema. No pallor.  Psychiatric: Affect and judgment normal.   Labs No visits with results within 3 Day(s) from this visit.  Latest known visit with results is:  Appointment on 12/04/2018  Component Date Value Ref Range Status  . WBC Count 12/04/2018 12.4* 4.0 - 10.5 K/uL Final  . RBC 12/04/2018 5.72  4.22 - 5.81 MIL/uL Final  . Hemoglobin 12/04/2018 16.2  13.0 - 17.0 g/dL Final  . HCT 12/04/2018 45.9  39.0 - 52.0 %  Final  . MCV 12/04/2018 80.2  80.0 - 100.0 fL Final  . MCH 12/04/2018 28.3  26.0 - 34.0 pg Final  . MCHC 12/04/2018 35.3  30.0 - 36.0 g/dL Final  . RDW 12/04/2018 13.4  11.5 - 15.5 % Final  . Platelet Count 12/04/2018 206  150 - 400 K/uL Final  . nRBC 12/04/2018 0.0  0.0 - 0.2 % Final  . Neutrophils Relative % 12/04/2018 65  % Final  . Neutro Abs 12/04/2018 8.0* 1.7 - 7.7 K/uL Final  . Lymphocytes Relative 12/04/2018 25  % Final  . Lymphs Abs 12/04/2018 3.0  0.7 - 4.0 K/uL Final  . Monocytes Relative 12/04/2018 9  % Final  . Monocytes Absolute 12/04/2018 1.1* 0.1 - 1.0 K/uL Final  . Eosinophils Relative 12/04/2018 0  % Final  . Eosinophils Absolute 12/04/2018 0.0  0.0 - 0.5 K/uL Final  . Basophils Relative 12/04/2018 0  % Final  . Basophils Absolute 12/04/2018 0.1  0.0 - 0.1 K/uL Final  . Immature Granulocytes 12/04/2018 1  % Final  . Abs Immature Granulocytes 12/04/2018 0.09* 0.00 - 0.07 K/uL Final   Performed at [facility], [place] [address] [place], [place] [address]  . Sodium 12/04/2018 136  135 - 145 mmol/L Final  . Potassium 12/04/2018 4.4  3.5 - 5.1 mmol/L Final  . Chloride 12/04/2018 102  98 - 111 mmol/L Final  . CO2 12/04/2018 25  22 - 32 mmol/L Final  . Glucose, Bld 12/04/2018 217* 70 - 99 mg/dL Final  . BUN 12/04/2018 11  6 - 20 mg/dL Final  . Creatinine 12/04/2018 1.17  0.61 - 1.24 mg/dL Final  . Calcium 12/04/2018 9.2  8.9 - 10.3 mg/dL Final  . Total Protein 12/04/2018 7.5  6.5 - 8.1 g/dL Final  . Albumin 12/04/2018 3.8  3.5 - 5.0 g/dL Final  . AST 12/04/2018 13* 15 - 41 U/L Final  . ALT 12/04/2018 55* 0 - 44 U/L Final  . Alkaline Phosphatase 12/04/2018 74  38 - 126 U/L Final  . Total Bilirubin 12/04/2018 0.8  0.3 - 1.2 mg/dL Final  . GFR, Est Non Af Am 12/04/2018 >60  >60 mL/min Final  . GFR, Est AFR Am 12/04/2018 >60  >60 mL/min Final  . Anion gap 12/04/2018 9  5 - 15 Final   Performed at [facility], [place]  [address] [place], [place] [address]  . LDH 12/04/2018 235* 98 - 192 U/L Final   Performed at [facility], [place] [address], [place], [place] [address]  . Prothrombin Time 12/04/2018 13.4  11.4 - 15.2 seconds Final  . INR 12/04/2018 1.0  0.8 - 1.2 Final   Comment: (NOTE) INR goal varies based on device and disease states. Performed at [facility], [place] [address], [place], [place] [address]      Pathology Orders Placed This Encounter  Procedures  . CBC with Differential (Cancer Center Only)    Standing Status:   Future    Standing Expiration Date:   12/11/2019  . CMP ([place] only)    Standing Status:   Future    Standing Expiration Date:   12/11/2019  . Lactate dehydrogenase (LDH)    Standing Status:   Future    Standing Expiration Date:   12/11/2019  . Protime-INR    Standing Status:   Future  Standing Expiration Date:   12/11/2019       Zoila Shutter MD

## 2018-12-11 NOTE — Telephone Encounter (Signed)
Scheduled appt per 3/26 los. ° °Printed calendar and avs. °

## 2018-12-15 ENCOUNTER — Telehealth: Payer: Self-pay | Admitting: Family Medicine

## 2018-12-15 NOTE — Telephone Encounter (Signed)
Pharmacy notified.

## 2018-12-15 NOTE — Telephone Encounter (Signed)
Pt verified address to have med delivery   [address] [place] [address]

## 2018-12-18 ENCOUNTER — Ambulatory Visit: Payer: Self-pay | Attending: Family Medicine | Admitting: Pharmacist

## 2018-12-18 ENCOUNTER — Other Ambulatory Visit: Payer: Self-pay

## 2018-12-18 DIAGNOSIS — I82509 Chronic embolism and thrombosis of unspecified deep veins of unspecified lower extremity: Secondary | ICD-10-CM

## 2018-12-18 LAB — POCT INR: INR: 1.7 — AB (ref 2.0–3.0)

## 2018-12-18 MED FILL — WARFARIN SODIUM 5 MG TABLET: 5 | 30 days supply | Qty: 45 | Fill #2

## 2018-12-18 NOTE — Progress Notes (Signed)
    Pharmacy Anticoagulation Clinic  Subjective: Patient presents today for INR monitoring. Anticoagulation indication is recurrent DVT of bilateral lower extremities.    HPI:  Patient seen by Dr. Melton Alar on 12/11/18. I spoke with Dr. Melton Alar on that day. Briefly, patient was instructed to retry warfarin. It was felt that his subtherapeutic INR was due to non-compliance vs tx failure. He was instructed to resume warfarin and Lovenox. Pt reports resuming 7.5 mg daily last Friday (12/12/18). He is compliant with the Lovenox as well.  Current dose of warfarin: 7.5 mg daily  Adherence to warfarin: confirms Signs/symptoms of bleeding: denies Recent changes in diet: denies Recent changes in medications: denies Upcoming procedures that may impact anticoagulation: none   Objective: Today's INR = 1.7  Lab Results  Component Value Date   INR 1.7 (A) 12/18/2018   INR 1.0 12/04/2018   INR 1.3 (A) 11/24/2018    Assessment and Plan: Anticoagulation: Patient is Subtherapeutic based on patient's INR of 1.7 and patient's INR goal of 2-3. Will Add to today's dose. Pt instructed to take 2 tablets (10 mg) and then resume normal schedule of 1.5 tablets (7.5mg ) daily.  Patient verbalized understanding and was provided with written instructions. Next INR check planned for 1 week.   [name], PharmD, CPP Clinical Pharmacist [facility] [phone]

## 2018-12-25 ENCOUNTER — Ambulatory Visit: Payer: Self-pay | Attending: Family Medicine | Admitting: Pharmacist

## 2018-12-25 ENCOUNTER — Other Ambulatory Visit: Payer: Self-pay

## 2018-12-25 DIAGNOSIS — I82509 Chronic embolism and thrombosis of unspecified deep veins of unspecified lower extremity: Secondary | ICD-10-CM

## 2018-12-25 LAB — POCT INR: INR: 2 (ref 2.0–3.0)

## 2019-01-08 ENCOUNTER — Other Ambulatory Visit: Payer: Self-pay

## 2019-01-08 ENCOUNTER — Ambulatory Visit: Payer: Self-pay | Attending: Family Medicine | Admitting: Pharmacist

## 2019-01-08 DIAGNOSIS — I82509 Chronic embolism and thrombosis of unspecified deep veins of unspecified lower extremity: Secondary | ICD-10-CM

## 2019-01-08 LAB — POCT INR: INR: 1.6 — AB (ref 2.0–3.0)

## 2019-01-13 ENCOUNTER — Telehealth: Payer: Self-pay | Admitting: Internal Medicine

## 2019-01-13 NOTE — Telephone Encounter (Signed)
Per 4/24 schedule message change 4/29 f/u to telephone visit. Message to [facility] due to patient also has lab 4/29. No response at this time. Spoke with desk and per desk nurse have patient keep lab and [facility] will call, if not able to reach patient leave f/u as scheduled. .   Called and not able to reach patient. Lab/fu 4/29 remain as scheduled.

## 2019-01-14 ENCOUNTER — Other Ambulatory Visit: Payer: Self-pay

## 2019-01-14 ENCOUNTER — Inpatient Hospital Stay: Payer: Self-pay

## 2019-01-14 ENCOUNTER — Other Ambulatory Visit: Payer: Self-pay | Admitting: Internal Medicine

## 2019-01-14 ENCOUNTER — Inpatient Hospital Stay: Payer: Self-pay | Attending: Hematology and Oncology | Admitting: Internal Medicine

## 2019-01-14 DIAGNOSIS — R7989 Other specified abnormal findings of blood chemistry: Secondary | ICD-10-CM

## 2019-01-14 DIAGNOSIS — E669 Obesity, unspecified: Secondary | ICD-10-CM

## 2019-01-14 DIAGNOSIS — I82403 Acute embolism and thrombosis of unspecified deep veins of lower extremity, bilateral: Secondary | ICD-10-CM

## 2019-01-14 DIAGNOSIS — I10 Essential (primary) hypertension: Secondary | ICD-10-CM

## 2019-01-14 NOTE — Progress Notes (Signed)
Virtual Visit via Telephone Note  I connected with David Dunlap on 01/14/19 at  2:00 PM EDT by telephone and verified that I am speaking with the correct person using two identifiers.   I discussed the limitations, risks, security and privacy concerns of performing an evaluation and management service by telephone and the availability of in person appointments. I also discussed with the patient that there may be a patient responsible charge related to this service. The patient expressed understanding and agreed to proceed.  Interval History:   Historical data obtained from note dated 08/06/2018.  48 year old male previously followed by Dr. Audelia Hives. On March 18, 2014 he presented to the emergency department at [facility] complaining of a 3-week history of right lower leg pain.  The pain started after he injured his right leg when stepping out of a truck 3 weeks ago.  He initially went to urgent care and was told he had a "muscle strain of his calf."  He had no chest pain or dyspnea.  A duplex venous Doppler was performed which revealed an acute deep vein thrombosis involving the right femoral, popliteal, peroneal, and posterior tibial veins.  He was started on anticoagulation with rivaroxaban.  On May 28, 2014 he presented once again to the emergency department at [facility] complaining of worsening right lower extremity pain.  According to the record, he started rivaroxaban but did not continue it for a period of at least 3 weeks.  A duplex venous Doppler of the right lower extremity was again performed.  At that time there was evidence of a subacute deep vein thrombosis involving the right femoral, right popliteal, and right peroneal veins.  For the outpatient [facility] pharmacy, rivaroxaban was made available.  An appointment was scheduled with cardiology (Dr. Verl Blalock) on June 09, 2014.  Apparently samples were provided on an interim basis.  On November 16, 2014 he presented again to the emergency  department at [facility].  At this time he was complaining of worsening bilateral lower extremity swelling and pain from his knees to his ankles.  He has been taking ibuprofen for pain relief.  "He states he is currently out of his prescribed anticoagulant, but was unable to afford to get the prescription refilled."  His last dose was apparently 2 months earlier.  A bilateral lower extremity duplex venous Doppler was performed on November 16, 2013.  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  There was on the left and acute deep vein thrombosis involving the left posterior tibial vein.  At that time he met with social service and was given information regarding "an orange card," walk-in times and medication to receive once he completed the required paperwork.  At that time he verbalized understanding and follow-up instructions.  On December 17, 2014 he presented to the emergency department after stepping out his front door and missing the first step causing his ankle to roll.  He felt a popping sensation immediately tenderness and swelling began.  Although he took Percocet for pain relief, the pain and swelling persisted to the time of presentation.  An x-ray revealed diffuse soft tissue swelling without evidence of fracture dislocation or ankle joint effusion.  On October 11, 2015 he presented to the emergency department again complaining of productive cough with green-yellow sputum.  Those findings were felt to be consistent with bronchitis since he had at that time a leukocytosis.  He was treated with azithromycin.  At that time he had no dyspnea and appeared to be compliant with rivaroxaban.  Several days later on October 28, 2015 he presented again with left leg pain in the emergency department at [facility].  The pain is worse with activity and walking.  He also noticed some increased swelling of the left leg.  A left lower extremity duplex venous Doppler  was performed.  Those findings were consistent with a nonobstructing chronic appearing thrombus involving the left peroneal vein.  There was resolution of the prior deep vein thrombosis in the left posterior tibial veins.  Left plantar fasciitis was suspected and he was started on prednisone.  Ice treatment and massage was recommended also.  On April 28, 2018 he was admitted with localized osteoarthritis right is of the left knee associated with severe unremitting pain that affected his sleep, daily activities, and work.  A total left knee arthroplasty was performed by Dr. Frankey Shown, orthopedic surgery.  He apparently obtained considerable relief following his surgery.  He was discharged on April 30, 2018.   On May 14, 2018 he presented to the emergency department at [facility], complaining of pain in both knees.  There was a slight amount of swelling in the right compared with the left.  There are chronic appearing skin changes in the right lower leg consistent with venous stasis.  His pulses were intact bilaterally.  There is normal plantar/dorsiflexion.  There was no evidence of cellulitis or septic joint.  He reportedly had not completed all of his physical therapy and there was some evidence of muscle atrophy. Outpatient duplex Dopplers were recommended and performed the following day.  Results of that study from July 15, 2018 revealed no evidence of deep vein thrombosis in the right lower extremity.  No cystic structure was found in the popliteal fossa.  On the left findings were consistent with an acute deep vein thrombosis involving the left gastrocnemius vein.  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  No cystic structure was found in the popliteal fossa.  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  He has a history of venous thromboembolic disease dating back to 2015.  There is a vague history of "blood clots" in 1 of his sisters.   Observations/Objective: Review recent labs and INR   Assessment and Plan:   1.  Left LE DVT.  Pt has history of DVT dating back to 2015.  Initially pt had DVT in RLL.  Doppler done July 15, 2018 showed:   Impression: Right: There is no evidence of deep vein thrombosis in the lower extremity. No cystic structure found in the popliteal fossa. Left: Findings consistent with acute deep vein thrombosis involving the left gastrocnemius vein. 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 No cystic structure found in the popliteal fossa.  He was put on Coumadin after reported Xarelto failure.  Labs done 11/17/2018 showed INR 1.  When questioned, pt reports he ran out of medication but reports he has taken it daily up to that point.  When questioned, he has not been taking the medication at the same time daily.  Review of records indicate problems with suspected noncompliance in the past.    Labs done 12/04/2018 reviewed and showed K+ 4.4 Cr 1.17 WBC  12.4 HB 16.2 plts 206,000 INR 1.  Pt was set up for bilateral LE doppler that was done 11/25/2018 that showed  Summary: Right: Findings consistent with acute deep vein thrombosis involving the right popliteal vein. Findings consistent with age indeterminate deep vein thrombosis involving the right gastrocnemius. No cystic structure found in the popliteal fossa. Left: Findings consistent with age indeterminate deep vein thrombosis involving the left gastrocnemius vein. No cystic structure found in the popliteal fossa.  Right LE DVT was new compared to doppler done 07/15/2018 that showed Right LE was negative for DVT.    Previously, I spoke with Benard Halsted,  PharmD regarding option to give pt a trial of coumadin 7.5 mg daily to over lap with lovenox 5-7 days to determine if INR can reach goal of 2-3.  I have instructed the patient to resume Coumadin and take the medication at same time each day.  He was on lovenox injections twice daily as directed which has reportedly been discontinued.  He has meet with Benard Halsted, PharmD in coumadin clinic with INR of 2 on 4/9 and INR of 1.6 on 01/08/2019.  Pt is now on coumadin per coumadin clinic recommendations.  Will Discuss with Acquanetta Belling Ausdall regarding next scheduled INR check.  I have reiterated to pt importance of compliance with Coumadin.  If  levels do not remain therapeutic, he will be recommended for ongoing lovenox injections.     Due to history of noncompliance, I do not presently feel this is a coumadin failure.  He is advised to take the medication consistently at the same time daily.  He should avoid missing doses.  Pt should continue coumadin clinic follow-up.  Pt will RTC in 02/2019 with labs.    2  Elevated LFTs.  Labs done 12/04/2018 reviewed and showed elevated AST of 55.  Pt had CT abdomen and pelvis done 11/13/2017 that showed fatty liver.  Pt will have repeat labs in 02/2019.    3.  Obesity.  Have also discussed risk factors for thrombosis.  Follow-up with PCP for weight management options.    4.  Hypertension. BP is 130/101. Follow-up with PCP for monitoring.  Pt reports BP usually under good control.    5.  Noncompliance.  There is reported history of noncompliance with medication.  Previously when questioned, pt reports he ran out of medication but reports he has taken it daily up to that point.  When questioned, he has not been taking the medication at the same time daily.  Review of records indicate problems with suspected noncompliance in the past.  I have reiterated importance of taking medication consistently at the same time daily.  He should avoid missing doses.  Pt will follow-up in  coumadin clinic for INR checks.    Doppler imaging history:    October 28, 2015 Findings consistent with non-obstructing chronic appearing thrombosis involving the left peroneal veins. There is resolution of prior deep vein thrombosis in the left posterior tibial veins.  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 Findings consistent with acute deep vein thrombosis involving the left posterial tibial vein.  Tinnie Gens  May 28, 2014 Findings consistent with subacute deep vein thrombosis involving the right femoral vein, right popliteal vein, and right peroneal vein.  Adele Barthel, MD  March 18, 2014 Findings consistent with acute deep vein thrombosis involving the femoral, popliteal, peroneal, and posterior tibial veins of the right  lower extremity.  Adele Barthel, MD  Current Status:  Pt is seen today for follow-up.  He reports he is taking lovenox twice a day as directed.    Follow Up Instructions: RTC in 02/2019 with labs.  Follow-up with coumadin clinic as directed.      I discussed the assessment and treatment plan with the patient. The patient was provided an opportunity to ask questions and all were answered. The patient agreed with the plan and demonstrated an understanding of the instructions.   The patient was advised to call back or seek an in-person evaluation if the symptoms worsen or if the condition fails to improve as anticipated.  I provided 26mnutes of non-face-to-face time during this encounter.   VZoila Shutter MD

## 2019-01-15 ENCOUNTER — Telehealth: Payer: Self-pay | Admitting: Internal Medicine

## 2019-01-15 NOTE — Telephone Encounter (Signed)
Tried to reach regarding schedule °

## 2019-01-20 ENCOUNTER — Telehealth: Payer: Self-pay | Admitting: Licensed Clinical Social Worker

## 2019-01-20 NOTE — Telephone Encounter (Signed)
LCSW placed return call to patient. Patient shared that he "is not doing well at all" States that he recently became homeless and is staying "wherever I can" He currently feels safe at current location. Pt is complying with medications to assist in the management of health conditions.  LCSW strongly encouraged pt to contact [facility]) to inquire about housing assistance. Pt has hx of receiving services through the [facility] in the past and is willing to follow up with them. He agreed to provide LCSW with an update by the end of the week. No additional concerns noted.

## 2019-01-22 ENCOUNTER — Ambulatory Visit: Payer: Self-pay | Attending: Family Medicine | Admitting: Pharmacist

## 2019-01-22 ENCOUNTER — Other Ambulatory Visit: Payer: Self-pay

## 2019-01-22 DIAGNOSIS — I82509 Chronic embolism and thrombosis of unspecified deep veins of unspecified lower extremity: Secondary | ICD-10-CM

## 2019-01-22 LAB — POCT INR: INR: 1.4 — AB (ref 2.0–3.0)

## 2019-01-28 ENCOUNTER — Telehealth: Payer: Self-pay | Admitting: Licensed Clinical Social Worker

## 2019-01-28 NOTE — Telephone Encounter (Signed)
LCSW placed follow up call to patient. Message for return call left.

## 2019-01-29 ENCOUNTER — Encounter: Payer: Self-pay | Admitting: Pharmacist

## 2019-01-29 ENCOUNTER — Telehealth: Payer: Self-pay

## 2019-01-29 NOTE — Telephone Encounter (Signed)
Called patient twice and phone went to voicemail each time. Left a message for patient to return call to [phone].

## 2019-01-29 NOTE — Telephone Encounter (Signed)
-----   Message from Vetta Higgs, MD sent at 01/29/2019 11:05 AM EDT ----- Regarding: FW: Warfarin Patient Can you contact this pt and let him know he will need to keep coumadin monitoring appointments and should contact their office to have blood checked at least by next week.  If not we will need to resume lovenox. ----- Message ----- From: Van Ausdall, Stephen L, RPH-CPP Sent: 01/29/2019  10:34 AM EDT To: Vetta Higgs, MD Subject: Warfarin Patient                               Good morning Dr. Higgs,   Messaging you this AM for Mr. Shomaker.   INR at appt last week (5/7) was subtherapeutic but pt admitted to missing two days of Coumadin. I continued his regimen and instructed him to follow-up with me today and he missed his appointment. When I attempted to reach him he diverted my calls.   Looks like we are heading towards long-term LMWH.   Luke    

## 2019-01-29 NOTE — Telephone Encounter (Signed)
Called patient and made him aware of Dr. Melton Alar instructions. Patient verbalized understanding.

## 2019-01-29 NOTE — Telephone Encounter (Signed)
-----   Message from Ahmed Prima, MD sent at 01/29/2019 11:05 AM EDT ----- Regarding: FW: Warfarin Patient Can you contact this pt and let him know he will need to keep coumadin monitoring appointments and should contact their office to have blood checked at least by next week.  If not we will need to resume lovenox. ----- Message ----- From: Drucilla Chalet, RPH-CPP Sent: 01/29/2019  10:34 AM EDT To: Ahmed Prima, MD Subject: Warfarin Patient                               Good morning Dr. Melton Alar,   Messaging you this AM for Mr. Adger.   INR at appt last week (5/7) was subtherapeutic but pt admitted to missing two days of Coumadin. I continued his regimen and instructed him to follow-up with me today and he missed his appointment. When I attempted to reach him he diverted my calls.   Looks like we are heading towards long-term LMWH.   Franky Macho

## 2019-02-16 ENCOUNTER — Ambulatory Visit: Payer: Self-pay | Attending: Family Medicine | Admitting: Family Medicine

## 2019-02-16 ENCOUNTER — Encounter: Payer: Self-pay | Admitting: Family Medicine

## 2019-02-16 ENCOUNTER — Other Ambulatory Visit: Payer: Self-pay

## 2019-02-16 DIAGNOSIS — F322 Major depressive disorder, single episode, severe without psychotic features: Secondary | ICD-10-CM

## 2019-02-16 DIAGNOSIS — Z96653 Presence of artificial knee joint, bilateral: Secondary | ICD-10-CM

## 2019-02-16 DIAGNOSIS — I82509 Chronic embolism and thrombosis of unspecified deep veins of unspecified lower extremity: Secondary | ICD-10-CM

## 2019-02-16 DIAGNOSIS — I10 Essential (primary) hypertension: Secondary | ICD-10-CM

## 2019-02-16 DIAGNOSIS — G629 Polyneuropathy, unspecified: Secondary | ICD-10-CM

## 2019-02-16 DIAGNOSIS — E1169 Type 2 diabetes mellitus with other specified complication: Secondary | ICD-10-CM

## 2019-02-16 MED ORDER — FLUOXETINE HCL 20 MG PO TABS: 40.0000 mg | ORAL_TABLET | Freq: Every day | ORAL | 5 refills | Status: DC

## 2019-02-16 MED ORDER — METFORMIN HCL 500 MG PO TABS: 1000.0000 mg | ORAL_TABLET | Freq: Two times a day (BID) | ORAL | 5 refills | Status: DC

## 2019-02-16 MED ORDER — GLIPIZIDE 10 MG PO TABS: 10.0000 mg | ORAL_TABLET | Freq: Two times a day (BID) | ORAL | 5 refills | Status: DC

## 2019-02-16 MED ORDER — GABAPENTIN 300 MG PO CAPS: 300.0000 mg | ORAL_CAPSULE | Freq: Three times a day (TID) | ORAL | 5 refills | Status: DC

## 2019-02-16 MED ORDER — LIRAGLUTIDE 18 MG/3ML ~~LOC~~ SOPN: 1.8000 mg | PEN_INJECTOR | Freq: Every day | SUBCUTANEOUS | 5 refills | Status: DC

## 2019-02-16 MED ORDER — WARFARIN SODIUM 5 MG PO TABS: ORAL_TABLET | ORAL | 2 refills | Status: DC

## 2019-02-16 MED ORDER — ATORVASTATIN CALCIUM 20 MG PO TABS: 20.0000 mg | ORAL_TABLET | Freq: Every day | ORAL | 5 refills | Status: DC

## 2019-02-16 MED ORDER — HYDROCHLOROTHIAZIDE 25 MG PO TABS: 25.0000 mg | ORAL_TABLET | Freq: Every day | ORAL | 5 refills | Status: DC

## 2019-02-16 MED ORDER — LISINOPRIL 10 MG PO TABS: 10.0000 mg | ORAL_TABLET | Freq: Every day | ORAL | 5 refills | Status: DC

## 2019-02-16 MED ORDER — LIRAGLUTIDE 18 MG/3ML ~~LOC~~ SOPN: PEN_INJECTOR | SUBCUTANEOUS | 5 refills | Status: DC

## 2019-02-16 MED FILL — GABAPENTIN 300 MG CAPSULE: 300 | 30 days supply | Qty: 90 | Fill #0

## 2019-02-16 MED FILL — LISINOPRIL 10 MG TABS: 10 | 30 days supply | Qty: 30 | Fill #0

## 2019-02-16 MED FILL — ?GLIPIZIDE 10 MG TABLET: 10 | 30 days supply | Qty: 60 | Fill #0

## 2019-02-16 MED FILL — !VICTOZA 18MG/3ML INJECT: 18 | 30 days supply | Qty: 9 | Fill #0

## 2019-02-16 MED FILL — ?HYDROCHLOROTHIAZIDE 25MG T: 25 | 30 days supply | Qty: 30 | Fill #0

## 2019-02-16 MED FILL — FLUoxetine HCL 20 MG TABS: 20 | 30 days supply | Qty: 60 | Fill #0

## 2019-02-16 MED FILL — WARFARIN SODIUM 5 MG TABLET: 5 | 30 days supply | Qty: 45 | Fill #0

## 2019-02-16 MED FILL — ?METFORMIN HCL 500MG TABLET: 500 | 30 days supply | Qty: 120 | Fill #0

## 2019-02-16 NOTE — Progress Notes (Signed)
Virtual Visit via Telephone Note  I connected with David MikeKeith D Student, on 02/16/2019 at 8:44 AM by telephone due to the COVID-19 pandemic and verified that I am speaking with the correct person using two identifiers.   Consent: I discussed the limitations, risks, security and privacy concerns of performing an evaluation and management service by telephone and the availability of in person appointments. I also discussed with the patient that there may be a patient responsible charge related to this service. The patient expressed understanding and agreed to proceed.   Location of Patient: Home  Location of Provider: Clinic   Persons participating in Telemedicine visit: Archer AsaKeith D Johanson Alicia Farrington-CMA Dr. Alvis LemmingsNewlin - PCP    History of Present Illness: David Dunlap is a  48 year old male with a history of type 2 diabetes mellitus (A1c 7.9 from 11/2018) hypertension, chronic PE, recurrent DVT (most recent episode in 06/2018) depression, right total knee replacement in 04/2017, left total knee replacement in 04/2018, obstructive sleep apnea who presents today for a follow-up visit.  He informs me his random sugars have been less than 200 and he is currently on 1.2 mg of Victoza but never went up to 1.8 mg.  Denies hypoglycemia and his neuropathy is controlled on current dose of gabapentin.  He is not up-to-date on annual eye exam as he has no medical coverage to see ophthalmology. Compliant with his antihypertensive but does not check his blood pressure regularly.  He is on chronic anticoagulation with Coumadin due to recurrent thromboembolic and his last INR was 1.4 on 01/22/2019.  His INR is usually checked by the clinical pharmacist. He complains of soreness in both knees which he rates as an 8/10 and this is intermittent.  He has not had much improvement since his bilateral knee replacement and is currently awaiting an appeal for his disability case.  Denies chest pain, dyspnea, pedal  edema. Depression is controlled on Prozac   Past Medical History:  Diagnosis Date  . Arthritis   . Depression   . Diabetes mellitus without complication (HCC) 05/2016   NEW ONSET    WITH FAMILY HISTORY  . DVT (deep venous thrombosis) (HCC)   . Essential hypertension   . Gallstones 05/2016  . GERD (gastroesophageal reflux disease)   . Sickle cell anemia (HCC)    Sickle trait  . Sleep apnea    Cpap   No Known Allergies  Current Outpatient Medications on File Prior to Visit  Medication Sig Dispense Refill  . atorvastatin (LIPITOR) 20 MG tablet Take 1 tablet (20 mg total) by mouth daily. 30 tablet 5  . Blood Glucose Monitoring Suppl (TRUE METRIX METER) DEVI 1 each by Does not apply route 3 (three) times daily before meals. 1 Device 0  . cetirizine (ZYRTEC) 10 MG tablet TAKE 1 TABLET BY MOUTH DAILY. (Patient taking differently: Take 10 mg by mouth daily. ) 30 tablet 1  . FLUoxetine (PROZAC) 20 MG tablet Take 2 tablets (40 mg total) by mouth daily. 60 tablet 5  . gabapentin (NEURONTIN) 300 MG capsule Take 1 capsule (300 mg total) by mouth 3 (three) times daily. 90 capsule 5  . glipiZIDE (GLUCOTROL) 10 MG tablet Take 1 tablet (10 mg total) by mouth 2 (two) times daily before a meal. 60 tablet 5  . glucose blood (TRUE METRIX BLOOD GLUCOSE TEST) test strip Used daily before meals 100 each 12  . hydrochlorothiazide (HYDRODIURIL) 25 MG tablet Take 1 tablet (25 mg total) by mouth daily. 30 tablet  5  . HYDROcodone-acetaminophen (NORCO) 5-325 MG tablet Take 1 tablet by mouth 2 (two) times daily as needed for moderate pain. 14 tablet 0  . lisinopril (PRINIVIL,ZESTRIL) 10 MG tablet Take 1 tablet (10 mg total) by mouth daily. 30 tablet 5  . metFORMIN (GLUCOPHAGE) 500 MG tablet Take 2 tablets (1,000 mg total) by mouth 2 (two) times daily with a meal. 120 tablet 5  . methocarbamol (ROBAXIN) 750 MG tablet Take 1 tablet (750 mg total) by mouth 2 (two) times daily as needed for muscle spasms. 60 tablet 1   . sildenafil (VIAGRA) 50 MG tablet Take 50 mg by mouth daily as needed for erectile dysfunction (at least 24 hours between doses).    . traMADol (ULTRAM) 50 MG tablet Take 1 tablet (50 mg total) by mouth every 12 (twelve) hours as needed. 1-2 pills every 6 hours as needed for pain 60 tablet 1  . TRUEPLUS LANCETS 28G MISC Use daily before meals 30 each 12  . warfarin (COUMADIN) 5 MG tablet Take 1 and 1/2 (1.5) tablets every day as directed by the Coumadin Clinic. 45 tablet 2  . enoxaparin (LOVENOX) 120 MG/0.8ML injection Inject 0.8 mLs (120 mg total) into the skin every 12 (twelve) hours. (Patient not taking: Reported on 02/16/2019) 60 Syringe 1  . liraglutide (VICTOZA) 18 MG/3ML SOPN Inject subcut 0.6mg  daily with breakfast for one week then increase to 1.2mg  daily for one week. (Patient not taking: Reported on 02/16/2019) 30 mL 3   No current facility-administered medications on file prior to visit.     Observations/Objective: Awake, alert, oriented x3 Not in acute distress  CMP Latest Ref Rng & Units 12/04/2018 11/17/2018 07/15/2018  Glucose 70 - 99 mg/dL 035(K) 093(G) 182(X)  BUN 6 - 20 mg/dL 11 6 6   Creatinine 0.61 - 1.24 mg/dL 9.37 1.69 6.78  Sodium 135 - 145 mmol/L 136 135 138  Potassium 3.5 - 5.1 mmol/L 4.4 4.7 3.9  Chloride 98 - 111 mmol/L 102 98 101  CO2 22 - 32 mmol/L 25 23 26   Calcium 8.9 - 10.3 mg/dL 9.2 9.8 9.2  Total Protein 6.5 - 8.1 g/dL 7.5 7.3 -  Total Bilirubin 0.3 - 1.2 mg/dL 0.8 0.5 -  Alkaline Phos 38 - 126 U/L 74 85 -  AST 15 - 41 U/L 13(L) 13 -  ALT 0 - 44 U/L 55(H) 40 -    Lipid Panel     Component Value Date/Time   CHOL 168 11/17/2018 1139   TRIG 153 (H) 11/17/2018 1139   HDL 40 11/17/2018 1139   CHOLHDL 4.2 11/17/2018 1139   CHOLHDL 4.6 12/08/2014 0942   VLDL 24 12/08/2014 0942   LDLCALC 97 11/17/2018 1139    Lab Results  Component Value Date   HGBA1C 7.9 (A) 11/17/2018    Assessment and Plan: 1. Type 2 diabetes mellitus with other specified  complication, without long-term current use of insulin (HCC) Not at goal with A1c of 7.9 which is greater than goal of less than 7 Advised to increase Victoza to 1.8 mg Counseled on Diabetic diet, my plate method, 938 minutes of moderate intensity exercise/week Keep blood sugar logs with fasting goals of 80-120 mg/dl, random of less than 101 and in the event of sugars less than 60 mg/dl or greater than 751 mg/dl please notify the clinic ASAP. It is recommended that you undergo annual eye exams and annual foot exams. Pneumonia vaccine is recommended. Discussed community resources for annual eye exam given he has  no medical coverage for referral to ophthalmology - atorvastatin (LIPITOR) 20 MG tablet; Take 1 tablet (20 mg total) by mouth daily.  Dispense: 30 tablet; Refill: 5 - glipiZIDE (GLUCOTROL) 10 MG tablet; Take 1 tablet (10 mg total) by mouth 2 (two) times daily before a meal.  Dispense: 60 tablet; Refill: 5 - metFORMIN (GLUCOPHAGE) 500 MG tablet; Take 2 tablets (1,000 mg total) by mouth 2 (two) times daily with a meal.  Dispense: 120 tablet; Refill: 5 - liraglutide (VICTOZA) 18 MG/3ML SOPN; Inject 0.3 mLs (1.8 mg total) into the skin daily with breakfast.  Dispense: 30 mL; Refill: 5  2. Severe single current episode of major depressive disorder, without psychotic features (HCC) Controlled - FLUoxetine (PROZAC) 20 MG tablet; Take 2 tablets (40 mg total) by mouth daily.  Dispense: 60 tablet; Refill: 5  3. Neuropathy Stable - gabapentin (NEURONTIN) 300 MG capsule; Take 1 capsule (300 mg total) by mouth 3 (three) times daily.  Dispense: 90 capsule; Refill: 5  4. Essential hypertension Advised to check blood pressures at home - hydrochlorothiazide (HYDRODIURIL) 25 MG tablet; Take 1 tablet (25 mg total) by mouth daily.  Dispense: 30 tablet; Refill: 5 - lisinopril (ZESTRIL) 10 MG tablet; Take 1 tablet (10 mg total) by mouth daily.  Dispense: 30 tablet; Refill: 5  5. Chronic venous embolism  and thrombosis of deep vessels of lower extremity, unspecified laterality (HCC) On chronic anticoagulation with Coumadin Last INR was 1.4 He has an upcoming appointment with the clinical pharmacist for Coumadin checks - warfarin (COUMADIN) 5 MG tablet; Take 1 and 1/2 (1.5) tablets every day as directed by the Coumadin Clinic.  Dispense: 45 tablet; Refill: 2  6. S/p total knee replacement, bilateral He does have intermittent pain Awaiting on his disability   Follow Up Instructions: Return in about 3 months (around 05/19/2019) for medical conditions, call for appointment.    I discussed the assessment and treatment plan with the patient. The patient was provided an opportunity to ask questions and all were answered. The patient agreed with the plan and demonstrated an understanding of the instructions.   The patient was advised to call back or seek an in-person evaluation if the symptoms worsen or if the condition fails to improve as anticipated.     I provided 26 minutes total of non-face-to-face time during this encounter including median intraservice time, reviewing previous notes, labs, imaging, medications, management and patient verbalized understanding.     Hoy Register, MD, FAAFP. [facility] [place], [place] [phone]   02/16/2019, 8:44 AM

## 2019-02-16 NOTE — Patient Instructions (Signed)
Diabetes Mellitus and Exercise Exercising regularly is important for your overall health, especially when you have diabetes (diabetes mellitus). Exercising is not only about losing weight. It has many other health benefits, such as increasing muscle strength and bone density and reducing body fat and stress. This leads to improved fitness, flexibility, and endurance, all of which result in better overall health. Exercise has additional benefits for people with diabetes, including:  Reducing appetite.  Helping to lower and control blood glucose.  Lowering blood pressure.  Helping to control amounts of fatty substances (lipids) in the blood, such as cholesterol and triglycerides.  Helping the body to respond better to insulin (improving insulin sensitivity).  Reducing how much insulin the body needs.  Decreasing the risk for heart disease by: ? Lowering cholesterol and triglyceride levels. ? Increasing the levels of good cholesterol. ? Lowering blood glucose levels. What is my activity plan? Your health care provider or certified diabetes educator can help you make a plan for the type and frequency of exercise (activity plan) that works for you. Make sure that you:  Do at least 150 minutes of moderate-intensity or vigorous-intensity exercise each week. This could be brisk walking, biking, or water aerobics. ? Do stretching and strength exercises, such as yoga or weightlifting, at least 2 times a week. ? Spread out your activity over at least 3 days of the week.  Get some form of physical activity every day. ? Do not go more than 2 days in a row without some kind of physical activity. ? Avoid being inactive for more than 30 minutes at a time. Take frequent breaks to walk or stretch.  Choose a type of exercise or activity that you enjoy, and set realistic goals.  Start slowly, and gradually increase the intensity of your exercise over time. What do I need to know about managing my  diabetes?   Check your blood glucose before and after exercising. ? If your blood glucose is 240 mg/dL (13.3 mmol/L) or higher before you exercise, check your urine for ketones. If you have ketones in your urine, do not exercise until your blood glucose returns to normal. ? If your blood glucose is 100 mg/dL (5.6 mmol/L) or lower, eat a snack containing 15-20 grams of carbohydrate. Check your blood glucose 15 minutes after the snack to make sure that your level is above 100 mg/dL (5.6 mmol/L) before you start your exercise.  Know the symptoms of low blood glucose (hypoglycemia) and how to treat it. Your risk for hypoglycemia increases during and after exercise. Common symptoms of hypoglycemia can include: ? Hunger. ? Anxiety. ? Sweating and feeling clammy. ? Confusion. ? Dizziness or feeling light-headed. ? Increased heart rate or palpitations. ? Blurry vision. ? Tingling or numbness around the mouth, lips, or tongue. ? Tremors or shakes. ? Irritability.  Keep a rapid-acting carbohydrate snack available before, during, and after exercise to help prevent or treat hypoglycemia.  Avoid injecting insulin into areas of the body that are going to be exercised. For example, avoid injecting insulin into: ? The arms, when playing tennis. ? The legs, when jogging.  Keep records of your exercise habits. Doing this can help you and your health care provider adjust your diabetes management plan as needed. Write down: ? Food that you eat before and after you exercise. ? Blood glucose levels before and after you exercise. ? The type and amount of exercise you have done. ? When your insulin is expected to peak, if you use   insulin. Avoid exercising at times when your insulin is peaking.  When you start a new exercise or activity, work with your health care provider to make sure the activity is safe for you, and to adjust your insulin, medicines, or food intake as needed.  Drink plenty of water while  you exercise to prevent dehydration or heat stroke. Drink enough fluid to keep your urine clear or pale yellow. Summary  Exercising regularly is important for your overall health, especially when you have diabetes (diabetes mellitus).  Exercising has many health benefits, such as increasing muscle strength and bone density and reducing body fat and stress.  Your health care provider or certified diabetes educator can help you make a plan for the type and frequency of exercise (activity plan) that works for you.  When you start a new exercise or activity, work with your health care provider to make sure the activity is safe for you, and to adjust your insulin, medicines, or food intake as needed. This information is not intended to replace advice given to you by your health care provider. Make sure you discuss any questions you have with your health care provider. Document Released: 11/24/2003 Document Revised: 03/14/2017 Document Reviewed: 02/13/2016 Elsevier Interactive Patient Education  2019 [employer]  

## 2019-02-16 NOTE — Progress Notes (Signed)
Per pt he need to talk about his Victoza. He do not know if he's needing to still be on it   Pt is needing refills for his medications  Follow up for Diabetes/HTN/

## 2019-03-02 IMAGING — CR DG CHEST 2V
2 series · 2 of 2 positions shown · non-contrast
Comparison: Chest radiograph dated 06/05/2016

CLINICAL DATA: 45-year-old male with right-sided chest pain and
cough.

EXAM:
CHEST  2 VIEW

[chest pa]
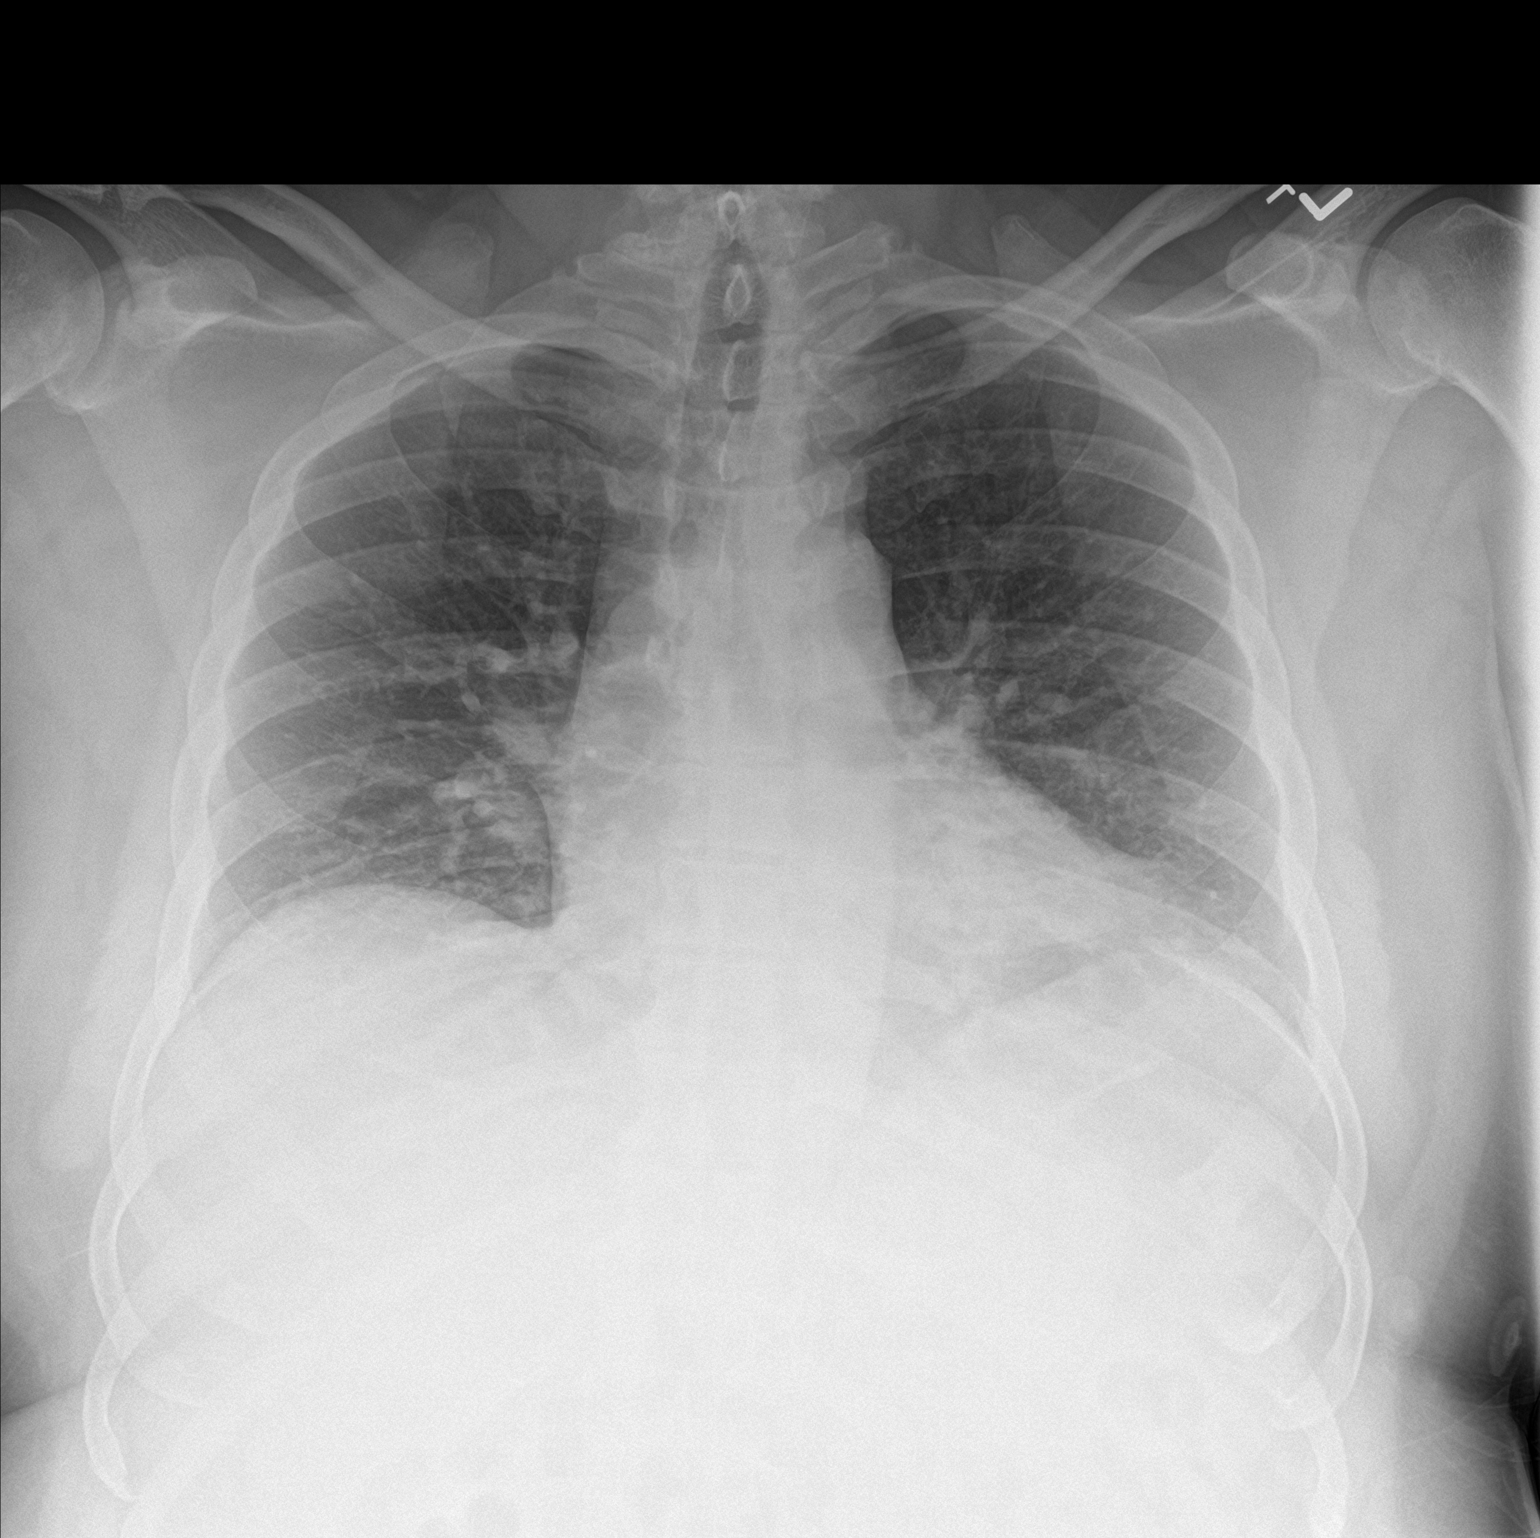

[chest lat]
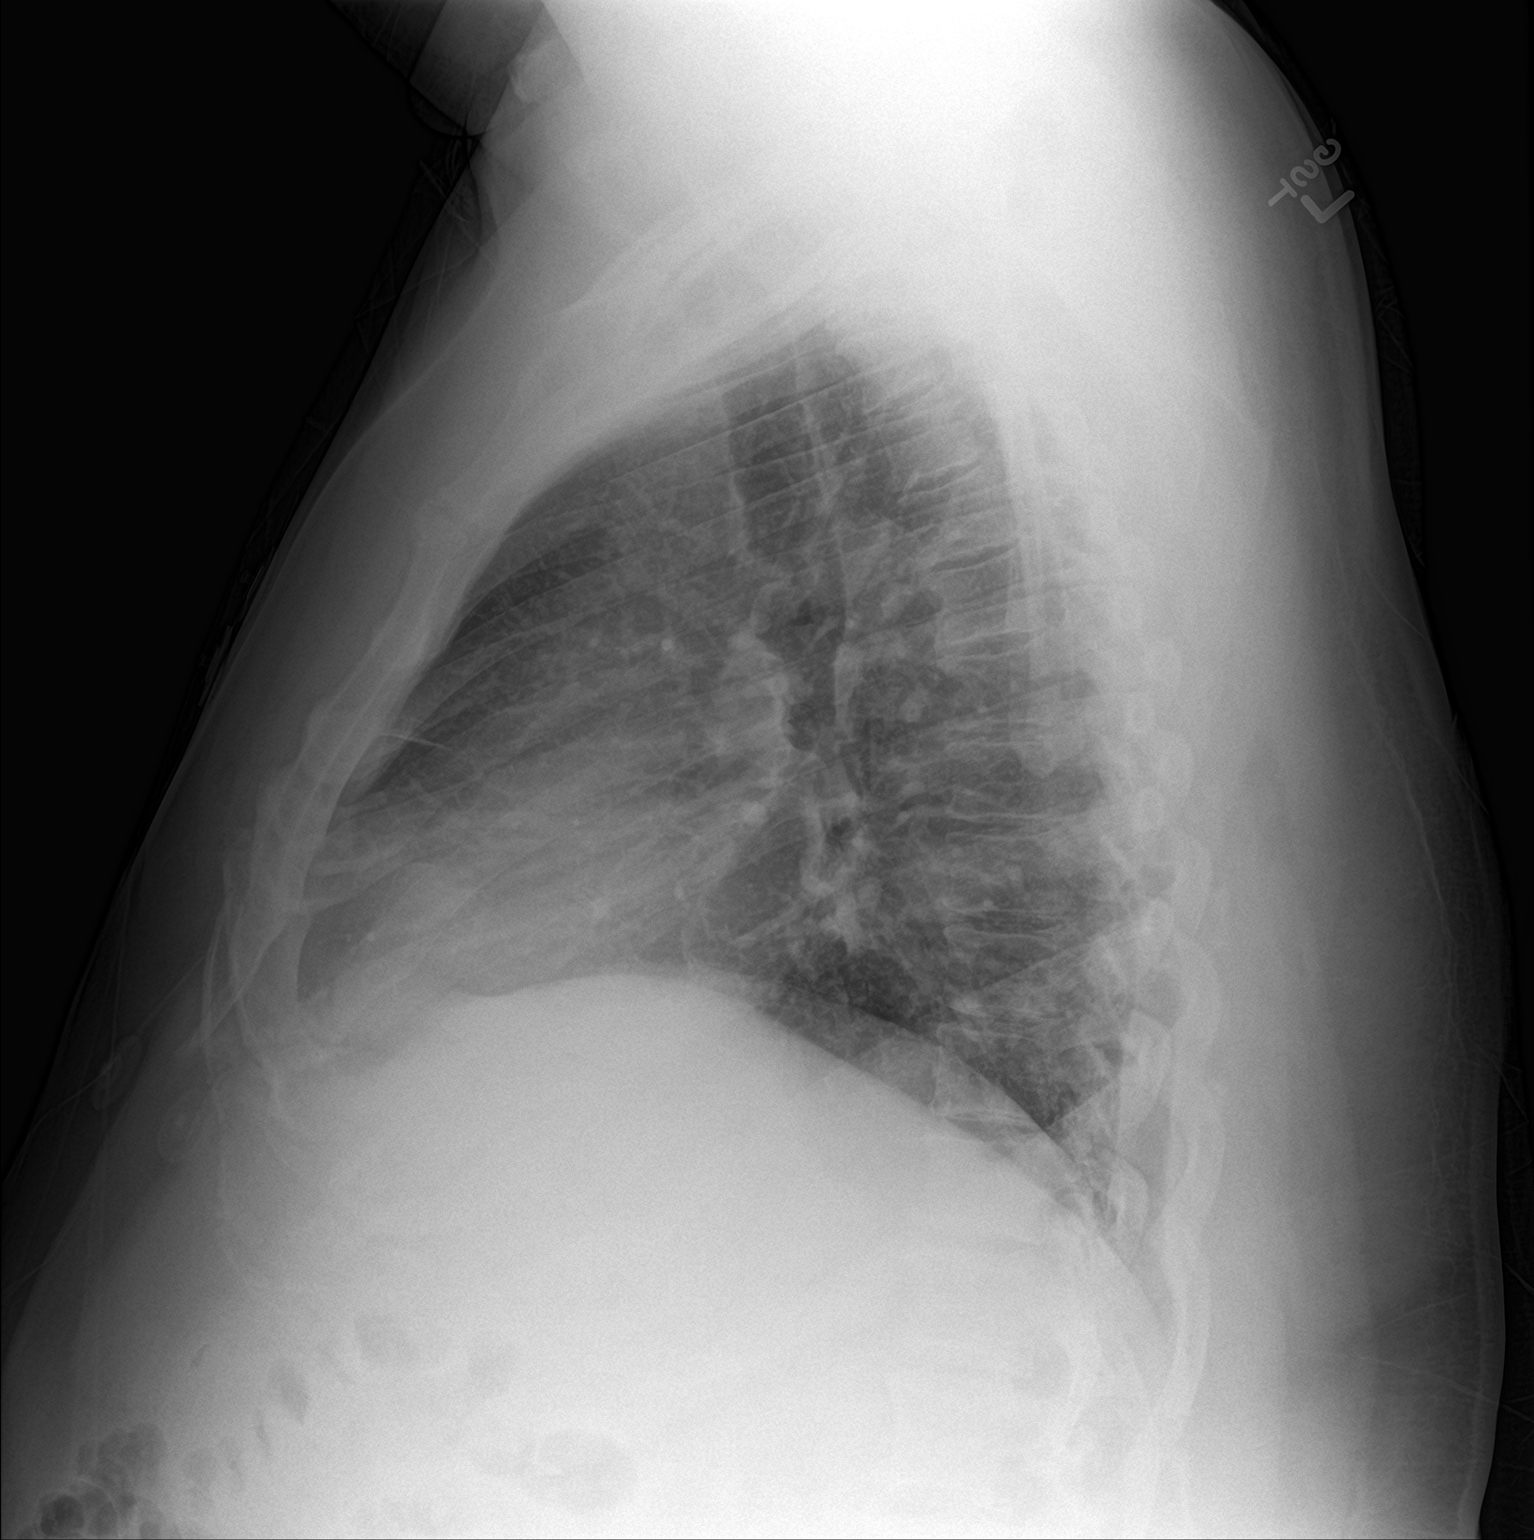

[2 of 2 positions shown; findings below may reference images not displayed]

FINDINGS: There is shallow inspiration. There is no focal consolidation,
pleural effusion, or pneumothorax. The cardiac silhouette is within
normal limits with no acute osseous pathology identified.
IMPRESSION: No active cardiopulmonary disease.

## 2019-03-13 ENCOUNTER — Inpatient Hospital Stay: Payer: Self-pay | Attending: Hematology and Oncology | Admitting: Internal Medicine

## 2019-03-13 ENCOUNTER — Inpatient Hospital Stay: Payer: Self-pay

## 2019-03-19 ENCOUNTER — Telehealth: Payer: Self-pay | Admitting: Licensed Clinical Social Worker

## 2019-03-19 NOTE — Telephone Encounter (Signed)
Return call placed to patient. Patient shared that he is experiencing chronic homelessness. He reports feelings of frustration due to pending status regarding his disability case.   LCSW provided support and encouragement. Pt provided verbal agreement to allow LCSW to complete referrals to [facility] and Legal Aid. No additional concerns noted.

## 2019-03-27 ENCOUNTER — Telehealth: Payer: Self-pay | Admitting: Licensed Clinical Social Worker

## 2019-03-27 NOTE — Telephone Encounter (Signed)
LCSW completed referral to [facility]. Emailed Marzetta Merino who requested pt's contact information via email at [email or web address]

## 2019-04-01 ENCOUNTER — Telehealth: Payer: Self-pay | Admitting: Licensed Clinical Social Worker

## 2019-04-01 NOTE — Telephone Encounter (Signed)
Call placed to patient to provide follow up on emergency housing resource. LCSW left a detailed message for a return call.

## 2019-12-15 NOTE — Telephone Encounter (Signed)
disregard

## 2020-07-25 ENCOUNTER — Ambulatory Visit (HOSPITAL_COMMUNITY)
Admission: EM | Admit: 2020-07-25 | Discharge: 2020-07-25 | Disposition: A | Discharge status: Home / Self Care | Payer: Self-pay | Attending: Family Medicine | Admitting: Family Medicine

## 2020-07-25 ENCOUNTER — Other Ambulatory Visit: Payer: Self-pay

## 2020-07-25 ENCOUNTER — Encounter (HOSPITAL_COMMUNITY): Payer: Self-pay

## 2020-07-25 DIAGNOSIS — L03213 Periorbital cellulitis: Secondary | ICD-10-CM

## 2020-07-25 MED ORDER — AMOXICILLIN-POT CLAVULANATE 875-125 MG PO TABS: 1.0000 | ORAL_TABLET | Freq: Two times a day (BID) | ORAL | 0 refills | Status: DC

## 2020-07-25 NOTE — Discharge Instructions (Addendum)
Take the antibiotics as prescribed Warm compresses to the eye.  Tylenol for pain as needed Follow up as needed for continued or worsening symptoms

## 2020-07-25 NOTE — ED Triage Notes (Signed)
Pt presents with left eye irritation and drainage since waking up this morning. 

## 2020-07-26 NOTE — ED Provider Notes (Signed)
[facility]    CSN: [id number] Arrival date & time: 07/25/20  1411      History   Chief Complaint Chief Complaint  Patient presents with  . Eye Problem    HPI David Dunlap is a 49 y.o. male.   Patient is a 49 year old male with past medical history of arthritis, depression, diabetes, DVT, hypertension, gallstones, GERD, sickle cell.  He presents today with left eye swelling, drainage and tenderness upon waking up this morning.  Symptoms been constant.  No associated fever, chills.  Tender to palpation of periorbital area with erythema and swelling     Past Medical History:  Diagnosis Date  . Arthritis   . Depression   . Diabetes mellitus without complication (HCC) 05/2016   NEW ONSET    WITH FAMILY HISTORY  . DVT (deep venous thrombosis) (HCC)   . Essential hypertension   . Gallstones 05/2016  . GERD (gastroesophageal reflux disease)   . Sickle cell anemia (HCC)    Sickle trait  . Sleep apnea    Cpap    Patient Active Problem List   Diagnosis Date Noted  . Morbid obesity (HCC) 07/22/2018  . Body mass index 40.0-44.9, adult (HCC) 07/22/2018  . Primary osteoarthritis of left knee   . Unilateral primary osteoarthritis, left knee 04/10/2018  . Obstructive sleep apnea 09/05/2017  . GERD (gastroesophageal reflux disease) 11/27/2016  . 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 Neuropathy 07/25/2016  . Acute cholecystitis 06/05/2016  . Diabetes mellitus without complication (HCC) 05/18/2016  . Cholelithiasis 04/09/2016  . Major depressive disorder, single episode 04/09/2016  . Leg DVT (deep venous thromboembolism), chronic (HCC) 12/08/2014  . Family history of diabetes mellitus (DM) 12/08/2014  . Moderate major depression, single episode (HCC) 08/22/2011  . Constipation 11/22/2010  . ANKLE EDEMA 10/19/2010  . Essential hypertension 10/12/2010    Past Surgical History:  Procedure Laterality Date  . CHOLECYSTECTOMY N/A 06/07/2016    Procedure: LAPAROSCOPIC CHOLECYSTECTOMY WITH ATTEMPTED INTRAOPERATIVE CHOLANGIOGRAM;  Surgeon: Violeta Gelinas, MD;  Location: [facility];  Service: General;  Laterality: N/A;  . LIGAMENT REPAIR     R forearm  . TOTAL KNEE ARTHROPLASTY Right 04/17/2017   Procedure: RIGHT TOTAL KNEE ARTHROPLASTY;  Surgeon: Tarry Kos, MD;  Location: [facility];  Service: Orthopedics;  Laterality: Right;  . TOTAL KNEE ARTHROPLASTY Left 04/28/2018   Procedure: LEFT TOTAL KNEE ARTHROPLASTY;  Surgeon: Tarry Kos, MD;  Location: [facility];  Service: Orthopedics;  Laterality: Left;       Home Medications    Prior to Admission medications   Medication Sig Start Date End Date Taking? Authorizing Provider  amoxicillin-clavulanate (AUGMENTIN) 875-125 MG tablet Take 1 tablet by mouth every 12 (twelve) hours. 07/25/20   Dahlia Byes A, NP  atorvastatin (LIPITOR) 20 MG tablet Take 1 tablet (20 mg total) by mouth daily. 02/16/19   Hoy Register, MD  Blood Glucose Monitoring Suppl (TRUE METRIX METER) DEVI 1 each by Does not apply route 3 (three) times daily before meals. 07/25/16   Hoy Register, MD  cetirizine (ZYRTEC) 10 MG tablet TAKE 1 TABLET BY MOUTH DAILY. Patient taking differently: Take 10 mg by mouth daily.  02/11/18   Hoy Register, MD  FLUoxetine (PROZAC) 20 MG tablet Take 2 tablets (40 mg total) by mouth daily. 02/16/19   Hoy Register, MD  gabapentin (NEURONTIN) 300 MG capsule Take 1 capsule (300 mg total) by mouth 3 (three) times daily. 02/16/19   Hoy Register, MD  glipiZIDE (  GLUCOTROL) 10 MG tablet Take 1 tablet (10 mg total) by mouth 2 (two) times daily before a meal. 02/16/19   Hoy Register, MD  glucose blood (TRUE METRIX BLOOD GLUCOSE TEST) test strip Used daily before meals 07/25/16   Hoy Register, MD  hydrochlorothiazide (HYDRODIURIL) 25 MG tablet Take 1 tablet (25 mg total) by mouth daily. 02/16/19   Hoy Register, MD  liraglutide (VICTOZA) 18 MG/3ML SOPN Inject 0.3 mLs (1.8 mg total) into the skin daily with  breakfast. 02/16/19   Hoy Register, MD  lisinopril (ZESTRIL) 10 MG tablet Take 1 tablet (10 mg total) by mouth daily. 02/16/19   Hoy Register, MD  metFORMIN (GLUCOPHAGE) 500 MG tablet Take 2 tablets (1,000 mg total) by mouth 2 (two) times daily with a meal. 02/16/19   Hoy Register, MD  methocarbamol (ROBAXIN) 750 MG tablet Take 1 tablet (750 mg total) by mouth 2 (two) times daily as needed for muscle spasms. 08/12/18   Hoy Register, MD  sildenafil (VIAGRA) 50 MG tablet Take 50 mg by mouth daily as needed for erectile dysfunction (at least 24 hours between doses).    [provider]  traMADol (ULTRAM) 50 MG tablet Take 1 tablet (50 mg total) by mouth every 12 (twelve) hours as needed. 1-2 pills every 6 hours as needed for pain 08/12/18   Hoy Register, MD  TRUEPLUS LANCETS 28G MISC Use daily before meals 07/25/16   Hoy Register, MD  warfarin (COUMADIN) 5 MG tablet Take 1 and 1/2 (1.5) tablets every day as directed by the [facility]. 02/16/19   Hoy Register, MD    Family History Family History  Problem Relation Age of Onset  . Diabetes Mother   . Diabetes Father   . Hypertension Sister     Social History Social History   Tobacco Use  . Smoking status: Never Smoker  . Smokeless tobacco: Never Used  Vaping Use  . Vaping Use: Never used  Substance Use Topics  . Alcohol use: Yes    Alcohol/week: 1.0 - 2.0 standard drink    Types: 1 - 2 Cans of beer per week    Comment: doesn't drink all the drink  . Drug use: No    Comment: last time 1989     Allergies   Patient has no known allergies.   Review of Systems Review of Systems   Physical Exam Triage Vital Signs ED Triage Vitals  Enc Vitals Group     BP 07/25/20 1606 126/82     Pulse Rate 07/25/20 1606 75     Resp 07/25/20 1606 17     Temp 07/25/20 1606 97.7 F (36.5 C)     Temp Source 07/25/20 1606 Oral     SpO2 07/25/20 1606 98 %     Weight --      Height --      Head Circumference --       Peak Flow --      Pain Score 07/25/20 1604 4     Pain Loc --      Pain Edu? --      Excl. in GC? --    No data found.  Updated Vital Signs BP 126/82 (BP Location: Right Arm)   Pulse 75   Temp 97.7 F (36.5 C) (Oral)   Resp 17   SpO2 98%   Visual Acuity Right Eye Distance:   Left Eye Distance:   Bilateral Distance:    Right Eye Near:   Left Eye Near:  Bilateral Near:     Physical Exam Vitals and nursing note reviewed.  Constitutional:      Appearance: Normal appearance.  HENT:     Head: Normocephalic and atraumatic.     Nose: Nose normal.  Eyes:     Conjunctiva/sclera: Conjunctivae normal.     Comments: Tender to palpation of entire periorbital area with generalized swelling and erythema  Pulmonary:     Effort: Pulmonary effort is normal.  Musculoskeletal:        General: Normal range of motion.     Cervical back: Normal range of motion.  Skin:    General: Skin is warm and dry.  Neurological:     Mental Status: He is alert.  Psychiatric:        Mood and Affect: Mood normal.      UC Treatments / Results  Labs (all labs ordered are listed, but only abnormal results are displayed) Labs Reviewed - No data to display  EKG   Radiology No results found.  Procedures Procedures (including critical care time)  Medications Ordered in UC Medications - No data to display  Initial Impression / Assessment and Plan / UC Course  I have reviewed the triage vital signs and the nursing notes.  Pertinent labs & imaging results that were available during my care of the patient were reviewed by me and considered in my medical decision making (see chart for details).     Preseptal cellulitis of left eye Treating with amoxicillin.  Recommend warm compresses to the eye.  Tylenol for pain as needed Follow up as needed for continued or worsening symptoms  Final Clinical Impressions(s) / UC Diagnoses   Final diagnoses:  Preseptal cellulitis of left eye      Discharge Instructions     Take the antibiotics as prescribed Warm compresses to the eye.  Tylenol for pain as needed Follow up as needed for continued or worsening symptoms     ED Prescriptions    Medication Sig Dispense Auth. Provider   amoxicillin-clavulanate (AUGMENTIN) 875-125 MG tablet Take 1 tablet by mouth every 12 (twelve) hours. 14 tablet Riely Baskett A, NP     PDMP not reviewed this encounter.   Janace Aris, NP 07/26/20 [phone]

## 2020-08-08 ENCOUNTER — Ambulatory Visit (HOSPITAL_COMMUNITY)
Admission: EM | Admit: 2020-08-08 | Discharge: 2020-08-08 | Disposition: A | Discharge status: Home / Self Care | Payer: Self-pay | Attending: Family Medicine | Admitting: Family Medicine

## 2020-08-08 ENCOUNTER — Other Ambulatory Visit: Payer: Self-pay

## 2020-08-08 ENCOUNTER — Encounter (HOSPITAL_COMMUNITY): Payer: Self-pay

## 2020-08-08 DIAGNOSIS — R21 Rash and other nonspecific skin eruption: Secondary | ICD-10-CM

## 2020-08-08 MED ORDER — METHYLPREDNISOLONE SODIUM SUCC 125 MG IJ SOLR
INTRAMUSCULAR | Status: AC
  Filled 2020-08-08: qty 2

## 2020-08-08 MED ORDER — METHYLPREDNISOLONE SODIUM SUCC 125 MG IJ SOLR
80.0000 mg | Freq: Once | INTRAMUSCULAR | Status: AC
  Administered 2020-08-08: 80 mg via INTRAMUSCULAR

## 2020-08-08 MED ORDER — HYDROXYZINE HCL 25 MG PO TABS: 25.0000 mg | ORAL_TABLET | Freq: Four times a day (QID) | ORAL | 0 refills | Status: DC

## 2020-08-08 NOTE — ED Provider Notes (Signed)
[facility]    CSN: [id number] Arrival date & time: 08/08/20  1403      History   Chief Complaint Chief Complaint  Patient presents with  . Rash    HPI David Dunlap is a 49 y.o. male.   Pt is a 49 year old male who presents today with rash.  The rash is generalized to back and stomach area.  Rash is itchy.  Has not done anything to treat the symptoms.  Finished antibiotics over a week ago for infection  Denies any fever, joint pain. Denies any recent changes in lotions, detergents, foods or other possible irritants. No recent travel. Nobody else at home has the rash. Patient has been outside but denies any contact with plants or insects. No new foods or medications.        Past Medical History:  Diagnosis Date  . Arthritis   . Depression   . Diabetes mellitus without complication (HCC) 05/2016   NEW ONSET    WITH FAMILY HISTORY  . DVT (deep venous thrombosis) (HCC)   . Essential hypertension   . Gallstones 05/2016  . GERD (gastroesophageal reflux disease)   . Sickle cell anemia (HCC)    Sickle trait  . Sleep apnea    Cpap    Patient Active Problem List   Diagnosis Date Noted  . Morbid obesity (HCC) 07/22/2018  . Body mass index 40.0-44.9, adult (HCC) 07/22/2018  . Primary osteoarthritis of left knee   . Unilateral primary osteoarthritis, left knee 04/10/2018  . Obstructive sleep apnea 09/05/2017  . GERD (gastroesophageal reflux disease) 11/27/2016  . 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 Neuropathy 07/25/2016  . Acute cholecystitis 06/05/2016  . Diabetes mellitus without complication (HCC) 05/18/2016  . Cholelithiasis 04/09/2016  . Major depressive disorder, single episode 04/09/2016  . Leg DVT (deep venous thromboembolism), chronic (HCC) 12/08/2014  . Family history of diabetes mellitus (DM) 12/08/2014  . Moderate major depression, single episode (HCC) 08/22/2011  . Constipation 11/22/2010  . ANKLE EDEMA 10/19/2010  .  Essential hypertension 10/12/2010    Past Surgical History:  Procedure Laterality Date  . CHOLECYSTECTOMY N/A 06/07/2016   Procedure: LAPAROSCOPIC CHOLECYSTECTOMY WITH ATTEMPTED INTRAOPERATIVE CHOLANGIOGRAM;  Surgeon: Violeta Gelinas, MD;  Location: [facility];  Service: General;  Laterality: N/A;  . LIGAMENT REPAIR     R forearm  . TOTAL KNEE ARTHROPLASTY Right 04/17/2017   Procedure: RIGHT TOTAL KNEE ARTHROPLASTY;  Surgeon: Tarry Kos, MD;  Location: [facility];  Service: Orthopedics;  Laterality: Right;  . TOTAL KNEE ARTHROPLASTY Left 04/28/2018   Procedure: LEFT TOTAL KNEE ARTHROPLASTY;  Surgeon: Tarry Kos, MD;  Location: [facility];  Service: Orthopedics;  Laterality: Left;       Home Medications    Prior to Admission medications   Medication Sig Start Date End Date Taking? Authorizing Provider  amoxicillin-clavulanate (AUGMENTIN) 875-125 MG tablet Take 1 tablet by mouth every 12 (twelve) hours. 07/25/20   Dahlia Byes A, NP  atorvastatin (LIPITOR) 20 MG tablet Take 1 tablet (20 mg total) by mouth daily. 02/16/19   Hoy Register, MD  Blood Glucose Monitoring Suppl (TRUE METRIX METER) DEVI 1 each by Does not apply route 3 (three) times daily before meals. 07/25/16   Hoy Register, MD  cetirizine (ZYRTEC) 10 MG tablet TAKE 1 TABLET BY MOUTH DAILY. Patient taking differently: Take 10 mg by mouth daily.  02/11/18   Hoy Register, MD  FLUoxetine (PROZAC) 20 MG tablet Take 2 tablets (40 mg total) by  mouth daily. 02/16/19   Hoy Register, MD  gabapentin (NEURONTIN) 300 MG capsule Take 1 capsule (300 mg total) by mouth 3 (three) times daily. 02/16/19   Hoy Register, MD  glipiZIDE (GLUCOTROL) 10 MG tablet Take 1 tablet (10 mg total) by mouth 2 (two) times daily before a meal. 02/16/19   Hoy Register, MD  glucose blood (TRUE METRIX BLOOD GLUCOSE TEST) test strip Used daily before meals 07/25/16   Hoy Register, MD  hydrochlorothiazide (HYDRODIURIL) 25 MG tablet Take 1 tablet (25 mg total) by mouth  daily. 02/16/19   Hoy Register, MD  hydrOXYzine (ATARAX/VISTARIL) 25 MG tablet Take 1 tablet (25 mg total) by mouth every 6 (six) hours. 08/08/20   Dahlia Byes A, NP  liraglutide (VICTOZA) 18 MG/3ML SOPN Inject 0.3 mLs (1.8 mg total) into the skin daily with breakfast. 02/16/19   Hoy Register, MD  lisinopril (ZESTRIL) 10 MG tablet Take 1 tablet (10 mg total) by mouth daily. 02/16/19   Hoy Register, MD  metFORMIN (GLUCOPHAGE) 500 MG tablet Take 2 tablets (1,000 mg total) by mouth 2 (two) times daily with a meal. 02/16/19   Hoy Register, MD  methocarbamol (ROBAXIN) 750 MG tablet Take 1 tablet (750 mg total) by mouth 2 (two) times daily as needed for muscle spasms. 08/12/18   Hoy Register, MD  sildenafil (VIAGRA) 50 MG tablet Take 50 mg by mouth daily as needed for erectile dysfunction (at least 24 hours between doses).    [provider]  traMADol (ULTRAM) 50 MG tablet Take 1 tablet (50 mg total) by mouth every 12 (twelve) hours as needed. 1-2 pills every 6 hours as needed for pain 08/12/18   Hoy Register, MD  TRUEPLUS LANCETS 28G MISC Use daily before meals 07/25/16   Hoy Register, MD  warfarin (COUMADIN) 5 MG tablet Take 1 and 1/2 (1.5) tablets every day as directed by the [facility]. 02/16/19   Hoy Register, MD    Family History Family History  Problem Relation Age of Onset  . Diabetes Mother   . Diabetes Father   . Hypertension Sister     Social History Social History   Tobacco Use  . Smoking status: Never Smoker  . Smokeless tobacco: Never Used  Vaping Use  . Vaping Use: Never used  Substance Use Topics  . Alcohol use: Yes    Alcohol/week: 1.0 - 2.0 standard drink    Types: 1 - 2 Cans of beer per week    Comment: doesn't drink all the drink  . Drug use: No    Comment: last time 1989     Allergies   Patient has no known allergies.   Review of Systems Review of Systems   Physical Exam Triage Vital Signs ED Triage Vitals  Enc Vitals Group      BP 08/08/20 1532 128/88     Pulse Rate 08/08/20 1532 96     Resp 08/08/20 1532 20     Temp 08/08/20 1532 98.8 F (37.1 C)     Temp Source 08/08/20 1532 Oral     SpO2 08/08/20 1532 96 %     Weight --      Height --      Head Circumference --      Peak Flow --      Pain Score 08/08/20 1531 5     Pain Loc --      Pain Edu? --      Excl. in GC? --    No data  found.  Updated Vital Signs BP 128/88 (BP Location: Right Arm)   Pulse 96   Temp 98.8 F (37.1 C) (Oral)   Resp 20   SpO2 96%   Visual Acuity Right Eye Distance:   Left Eye Distance:   Bilateral Distance:    Right Eye Near:   Left Eye Near:    Bilateral Near:     Physical Exam Vitals and nursing note reviewed.  Constitutional:      Appearance: Normal appearance.  HENT:     Head: Normocephalic and atraumatic.  Eyes:     Conjunctiva/sclera: Conjunctivae normal.  Pulmonary:     Effort: Pulmonary effort is normal.  Musculoskeletal:        General: Normal range of motion.     Cervical back: Normal range of motion.  Skin:    General: Skin is warm and dry.     Findings: Rash present.     Comments: Generalized urticaria to back area and stomach area.  Neurological:     Mental Status: He is alert.  Psychiatric:        Mood and Affect: Mood normal.      UC Treatments / Results  Labs (all labs ordered are listed, but only abnormal results are displayed) Labs Reviewed - No data to display  EKG   Radiology No results found.  Procedures Procedures (including critical care time)  Medications Ordered in UC Medications  methylPREDNISolone sodium succinate (SOLU-MEDROL) 125 mg/2 mL injection 80 mg (has no administration in time range)    Initial Impression / Assessment and Plan / UC Course  I have reviewed the triage vital signs and the nursing notes.  Pertinent labs & imaging results that were available during my care of the patient were reviewed by me and considered in my medical decision making  (see chart for details).     Rash-appears to be some sort of allergic response.  Viral or drug reaction It is been over a week since he took the amoxicillin. We will treat with steroid injection here in clinic.  Hydroxyzine as needed for itching Follow up as needed for continued or worsening symptoms  Final Clinical Impressions(s) / UC Diagnoses   Final diagnoses:  Rash     Discharge Instructions     Steroid injection given here.  Hydroxyzine as needed.  Be aware this medication may make you slightly drowsy. Follow up as needed for continued or worsening symptoms     ED Prescriptions    Medication Sig Dispense Auth. Provider   hydrOXYzine (ATARAX/VISTARIL) 25 MG tablet Take 1 tablet (25 mg total) by mouth every 6 (six) hours. 12 tablet Pancho Rushing A, NP     PDMP not reviewed this encounter.   Dahlia Byes A, NP 08/08/20 1600

## 2020-08-08 NOTE — ED Triage Notes (Signed)
Pt presents with rash on abdomen and back X 3 days.

## 2020-08-08 NOTE — Discharge Instructions (Addendum)
Steroid injection given here.  Hydroxyzine as needed.  Be aware this medication may make you slightly drowsy. Follow up as needed for continued or worsening symptoms

## 2021-01-09 ENCOUNTER — Other Ambulatory Visit: Payer: Self-pay

## 2021-01-09 ENCOUNTER — Ambulatory Visit: Payer: Self-pay | Attending: Nurse Practitioner | Admitting: Nurse Practitioner

## 2021-01-09 ENCOUNTER — Encounter: Payer: Self-pay | Admitting: Nurse Practitioner

## 2021-01-09 VITALS — BP 142/91 | HR 92 | Ht 69.0 in | Wt 280.0 lb

## 2021-01-09 DIAGNOSIS — Z86711 Personal history of pulmonary embolism: Secondary | ICD-10-CM

## 2021-01-09 DIAGNOSIS — M25562 Pain in left knee: Secondary | ICD-10-CM

## 2021-01-09 DIAGNOSIS — G629 Polyneuropathy, unspecified: Secondary | ICD-10-CM

## 2021-01-09 DIAGNOSIS — F322 Major depressive disorder, single episode, severe without psychotic features: Secondary | ICD-10-CM

## 2021-01-09 DIAGNOSIS — E1140 Type 2 diabetes mellitus with diabetic neuropathy, unspecified: Secondary | ICD-10-CM

## 2021-01-09 DIAGNOSIS — N529 Male erectile dysfunction, unspecified: Secondary | ICD-10-CM

## 2021-01-09 DIAGNOSIS — M25561 Pain in right knee: Secondary | ICD-10-CM

## 2021-01-09 DIAGNOSIS — E1169 Type 2 diabetes mellitus with other specified complication: Secondary | ICD-10-CM

## 2021-01-09 DIAGNOSIS — J302 Other seasonal allergic rhinitis: Secondary | ICD-10-CM

## 2021-01-09 DIAGNOSIS — G8929 Other chronic pain: Secondary | ICD-10-CM

## 2021-01-09 DIAGNOSIS — R21 Rash and other nonspecific skin eruption: Secondary | ICD-10-CM

## 2021-01-09 DIAGNOSIS — IMO0002 Reserved for concepts with insufficient information to code with codable children: Secondary | ICD-10-CM

## 2021-01-09 DIAGNOSIS — E785 Hyperlipidemia, unspecified: Secondary | ICD-10-CM

## 2021-01-09 DIAGNOSIS — R059 Cough, unspecified: Secondary | ICD-10-CM

## 2021-01-09 DIAGNOSIS — E0800 Diabetes mellitus due to underlying condition with hyperosmolarity without nonketotic hyperglycemic-hyperosmolar coma (NKHHC): Secondary | ICD-10-CM

## 2021-01-09 DIAGNOSIS — I10 Essential (primary) hypertension: Secondary | ICD-10-CM

## 2021-01-09 DIAGNOSIS — Z0289 Encounter for other administrative examinations: Secondary | ICD-10-CM

## 2021-01-09 DIAGNOSIS — Z86718 Personal history of other venous thrombosis and embolism: Secondary | ICD-10-CM

## 2021-01-09 DIAGNOSIS — Z1211 Encounter for screening for malignant neoplasm of colon: Secondary | ICD-10-CM

## 2021-01-09 DIAGNOSIS — F419 Anxiety disorder, unspecified: Secondary | ICD-10-CM

## 2021-01-09 DIAGNOSIS — E1165 Type 2 diabetes mellitus with hyperglycemia: Secondary | ICD-10-CM

## 2021-01-09 DIAGNOSIS — F32A Depression, unspecified: Secondary | ICD-10-CM

## 2021-01-09 LAB — POCT GLYCOSYLATED HEMOGLOBIN (HGB A1C): HbA1c, POC (controlled diabetic range): 8.7 % — AB (ref 0.0–7.0)

## 2021-01-09 LAB — GLUCOSE, POCT (MANUAL RESULT ENTRY): POC Glucose: 289 mg/dl — AB (ref 70–99)

## 2021-01-09 MED ORDER — FLUOXETINE HCL 20 MG PO TABS
40.0000 mg | ORAL_TABLET | Freq: Every day | ORAL | 3 refills | Status: DC
  Filled 2021-01-09: qty 60, 30d supply, fill #0
  Filled 2021-07-06: qty 60, 30d supply, fill #1

## 2021-01-09 MED ORDER — ATORVASTATIN CALCIUM 20 MG PO TABS
20.0000 mg | ORAL_TABLET | Freq: Every day | ORAL | 1 refills | Status: DC
  Filled 2021-01-09: qty 30, 30d supply, fill #0

## 2021-01-09 MED ORDER — GLIPIZIDE 10 MG PO TABS
10.0000 mg | ORAL_TABLET | Freq: Two times a day (BID) | ORAL | 5 refills | Status: DC
  Filled 2021-01-09: qty 60, 30d supply, fill #0
  Filled 2021-07-06: qty 60, 30d supply, fill #1

## 2021-01-09 MED ORDER — TRIAMCINOLONE ACETONIDE 0.025 % EX OINT
1.0000 "application " | TOPICAL_OINTMENT | Freq: Two times a day (BID) | CUTANEOUS | 0 refills | Status: DC
  Filled 2021-01-09: qty 30, 15d supply, fill #0

## 2021-01-09 MED ORDER — HYDROXYZINE HCL 25 MG PO TABS
25.0000 mg | ORAL_TABLET | Freq: Three times a day (TID) | ORAL | 1 refills | Status: DC | PRN
  Filled 2021-01-09: qty 60, 20d supply, fill #0

## 2021-01-09 MED ORDER — LISINOPRIL 10 MG PO TABS
10.0000 mg | ORAL_TABLET | Freq: Every day | ORAL | 1 refills | Status: DC
  Filled 2021-01-09: qty 30, 30d supply, fill #0
  Filled 2021-07-06: qty 30, 30d supply, fill #1

## 2021-01-09 MED ORDER — METHOCARBAMOL 750 MG PO TABS
750.0000 mg | ORAL_TABLET | Freq: Two times a day (BID) | ORAL | 1 refills | Status: DC | PRN
Start: 2021-01-09 — End: 2021-09-26
  Filled 2021-01-09: qty 60, 30d supply, fill #0

## 2021-01-09 MED ORDER — TRUEPLUS LANCETS 28G MISC
12 refills | Status: DC
  Filled 2021-01-09: qty 100, 25d supply, fill #0

## 2021-01-09 MED ORDER — GABAPENTIN 300 MG PO CAPS
300.0000 mg | ORAL_CAPSULE | Freq: Three times a day (TID) | ORAL | 5 refills | Status: DC
  Filled 2021-01-09: qty 90, 30d supply, fill #0
  Filled 2021-07-06: qty 90, 30d supply, fill #1

## 2021-01-09 MED ORDER — CETIRIZINE HCL 10 MG PO TABS
10.0000 mg | ORAL_TABLET | Freq: Every day | ORAL | 1 refills | Status: DC
  Filled 2021-01-09: qty 90, 90d supply, fill #0

## 2021-01-09 MED ORDER — VICTOZA 18 MG/3ML ~~LOC~~ SOPN
1.8000 mg | PEN_INJECTOR | Freq: Every day | SUBCUTANEOUS | 3 refills | Status: DC
  Filled 2021-01-09: qty 9, 30d supply, fill #0
  Filled 2021-07-06: qty 9, 30d supply, fill #1

## 2021-01-09 MED ORDER — METFORMIN HCL 500 MG PO TABS
1000.0000 mg | ORAL_TABLET | Freq: Two times a day (BID) | ORAL | 5 refills | Status: DC
  Filled 2021-01-09: qty 120, 30d supply, fill #0
  Filled 2021-07-06: qty 120, 30d supply, fill #1

## 2021-01-09 MED ORDER — TRUE METRIX BLOOD GLUCOSE TEST VI STRP
ORAL_STRIP | 12 refills | Status: DC
  Filled 2021-01-09: qty 100, 25d supply, fill #0

## 2021-01-09 MED ORDER — SILDENAFIL CITRATE 50 MG PO TABS
50.0000 mg | ORAL_TABLET | Freq: Every day | ORAL | 1 refills | Status: DC | PRN
  Filled 2021-01-09: qty 10, 30d supply, fill #0

## 2021-01-09 MED ORDER — HYDROCHLOROTHIAZIDE 25 MG PO TABS
25.0000 mg | ORAL_TABLET | Freq: Every day | ORAL | 1 refills | Status: DC
  Filled 2021-01-09: qty 30, 30d supply, fill #0
  Filled 2021-07-06: qty 30, 30d supply, fill #1

## 2021-01-09 MED ORDER — ACETAMINOPHEN-CODEINE #3 300-30 MG PO TABS
1.0000 | ORAL_TABLET | Freq: Three times a day (TID) | ORAL | 0 refills | Status: DC | PRN
  Filled 2021-01-09 – 2021-01-16 (×2): qty 60, 20d supply, fill #0

## 2021-01-09 MED ORDER — RIVAROXABAN 20 MG PO TABS
20.0000 mg | ORAL_TABLET | Freq: Every day | ORAL | 3 refills | Status: DC
  Filled 2021-01-09: qty 30, 30d supply, fill #0
  Filled 2021-07-06: qty 30, 30d supply, fill #1

## 2021-01-09 NOTE — Progress Notes (Signed)
Assessment & Plan:  David Dunlap was seen today for diabetes.  Diagnoses and all orders for this visit:  Type 2 diabetes mellitus with other specified complication, without long-term current use of insulin (HCC) -     POCT glycosylated hemoglobin (Hb A1C) -     POCT glucose (manual entry) -     atorvastatin (LIPITOR) 20 MG tablet; Take 1 tablet (20 mg total) by mouth daily. -     glipiZIDE (GLUCOTROL) 10 MG tablet; Take 1 tablet (10 mg total) by mouth 2 (two) times daily before a meal. -     glucose blood (TRUE METRIX BLOOD GLUCOSE TEST) test strip; Used daily before meals -     liraglutide (VICTOZA) 18 MG/3ML SOPN; Inject 1.8 mg into the skin daily with breakfast. -     metFORMIN (GLUCOPHAGE) 500 MG tablet; Take 2 tablets (1,000 mg total) by mouth 2 (two) times daily with a meal. -     TRUEplus Lancets 28G MISC; Use daily before meals Continue blood sugar control as discussed in office today, low carbohydrate diet, and regular physical exercise as tolerated, 150 minutes per week (30 min each day, 5 days per week, or 50 min 3 days per week). Keep blood sugar logs with fasting goal of 90-130 mg/dl, post prandial (after you eat) less than 180.  For Hypoglycemia: BS <60 and Hyperglycemia BS >400; contact the clinic ASAP. Annual eye exams and foot exams are recommended.   Uncontrolled type 2 diabetes with neuropathy (HCC) -     gabapentin (NEURONTIN) 300 MG capsule; Take 1 capsule (300 mg total) by mouth 3 (three) times daily.  Dyslipidemia, goal LDL below 70 -     Lipid panel INSTRUCTIONS: Work on a low fat, heart healthy diet and participate in regular aerobic exercise program by working out at least 150 minutes per week; 5 days a week-30 minutes per day. Avoid red meat/beef/steak,  fried foods. junk foods, sodas, sugary drinks, unhealthy snacking, alcohol and smoking.  Drink at least 80 oz of water per day and monitor your carbohydrate intake daily.    Primary hypertension -      hydrochlorothiazide (HYDRODIURIL) 25 MG tablet; Take 1 tablet (25 mg total) by mouth daily. -     lisinopril (ZESTRIL) 10 MG tablet; Take 1 tablet (10 mg total) by mouth daily. -     CMP14+EGFR Continue all antihypertensives as prescribed.  Remember to bring in your blood pressure log with you for your follow up appointment.  DASH/Mediterranean Diets are healthier choices for HTN.    Anxiety and depression -     FLUoxetine (PROZAC) 20 MG tablet; Take 2 tablets (40 mg total) by mouth daily. -     hydrOXYzine (ATARAX/VISTARIL) 25 MG tablet; Take 1 tablet (25 mg total) by mouth every 8 (eight) hours as needed.  Seasonal allergies -     cetirizine (ZYRTEC) 10 MG tablet; Take 1 tablet (10 mg total) by mouth daily.  Chronic pain of both knees -     acetaminophen-codeine (TYLENOL #3) 300-30 MG tablet; Take 1 tablet by mouth every 8 (eight) hours as needed for moderate pain. -     gabapentin (NEURONTIN) 300 MG capsule; Take 1 capsule (300 mg total) by mouth 3 (three) times daily. -     methocarbamol (ROBAXIN) 750 MG tablet; Take 1 tablet (750 mg total) by mouth 2 (two) times daily as needed for muscle spasms. Work on losing weight to help reduce joint pain. May alternate with heat  and ice application for pain relief. May also alternate with acetaminophen as prescribed pain relief. Other alternatives include massage, acupuncture and water aerobics.  You must stay active and avoid a sedentary lifestyle.  Pain medication agreement -     Drug Screen 10 W/Conf, Se  Erectile dysfunction, unspecified erectile dysfunction type -     sildenafil (VIAGRA) 50 MG tablet; Take 1 tablet (50 mg total) by mouth daily as needed for erectile dysfunction (at least 24 hours between doses).  Skin rash -     triamcinolone (KENALOG) 0.025 % ointment; Apply 1 application topically 2 (two) times daily.  History of DVT (deep vein thrombosis) -     CBC -     rivaroxaban (XARELTO) 20 MG TABS tablet; Take 1 tablet (20 mg  total) by mouth daily with supper. NEEDS PASS  Screening for colon cancer -     Fecal occult blood, imunochemical    Patient has been counseled on age-appropriate routine health concerns for screening and prevention. These are reviewed and up-to-date. Referrals have been placed accordingly. Immunizations are up-to-date or declined.    Subjective:   Chief Complaint  Patient presents with  . Diabetes   HPI David Dunlap 50 y.o. male presents to office today for follow up and to re establish care. He was last evaluated in this office by another provider on 02-16-2019. He has a past medical history of Arthritis (b/l knee replacements 2018/2019), Depression, DM2 (05/2016), recurrent DVTs, Essential hypertension, Gallstones (05/2016), GERD, Sickle cell anemia and Sleep apnea (nonadherent with CPAP)  DVT/PE I am discontinuing his coumadin today and starting him on xarelto. He has not been taking coumadin in 25month. [place]  He was evaluated by Hematology on 01-14-2019. Initial DVT July 2015 and patient was placed on xarelto. Sept 2015 he was noted for recurrent DVT. According to the record, he started rivaroxaban in July but did not continue it for a period of at least 3 weeks. It was again resumed in September and he was instructed to follow up with Cardiology. March 2016 he was again noted for RLE DVT (chronic) likely due to medication non adherence.  Feb 2017 he was noted for non obstructing LLE DVT. Oct 2019 three months after left knee arthroplasty he was noted for recurrent LLE DVT. He was placed on coumadin for reported xarelto failure however it is unclear if there was a factor of  medication non adherence. Per hematology note: He was put on Coumadin after reported Xarelto failure.    When questioned, pt reports he ran out of medication but reports he has taken it daily up to that point.  When questioned, he has not been taking the medication at the same  time daily.  Review of records indicate problems with suspected noncompliance in the past Doppler imaging history:   October 28, 2015 Findings consistent with non-obstructing chronic appearing thrombosis involving the left peroneal veins. There is resolution of prior deep vein thrombosis in the left posterior tibial veins. November 17, 2014 Right popliteal vein DVT. Findings consistent with acute deep vein thrombosis involving the left posterial tibial vein. May 28, 2014 Right femoral vein, right popliteal vein, and right peroneal Vein subacute DVT. March 18, 2014 RLE Femoral, popliteal, peroneal, and posterior tibial veins DVT   B/L Knee OA Chronic B/L knee Pain is worse with weight bearing and prolonged standing. States after his knee surgery his pain was minimal but never truly resolved. Pain is occurring  daily and associated symptoms include swelling in the patellar region He states he has applied for disability several times and been denied.     DM 2 Poorly controlled. Will restart medications and have him return for meter check. He was previously taking victoza 1.30m daily, metformin 1000 mg BID and glipizide 10 mg BID. He takes gabapentin for hyperglycemic symptoms of neuropathy. LDL not at goal. Will refill atorvastatin 279mdaily.  Lab Results  Component Value Date   HGBA1C 8.7 (A) 01/09/2021   Lab Results  Component Value Date   HGBA1C 7.9 (A) 11/17/2018      Essential Hypertesion Not well controlled. Will resume lisinopril 10 mg daily and HCTZ 25 mg daily at this time.  BP Readings from Last 3 Encounters:  01/09/21 (!) 142/91  08/08/20 128/88  07/25/20 126/82   Anxiety and Depression Will resume prozac 40 mg daily and hydroxyzine 25 mg TID prn.   Review of Systems  Constitutional: Negative for fever, malaise/fatigue and weight loss.  HENT: Negative.  Negative for nosebleeds.   Eyes: Negative.  Negative for blurred vision, double vision and photophobia.   Respiratory: Negative.  Negative for cough and shortness of breath.   Cardiovascular: Negative.  Negative for chest pain, palpitations and leg swelling.  Gastrointestinal: Negative.  Negative for heartburn, nausea and vomiting.  Genitourinary:       ERECTILE DYSFUNCTION  Musculoskeletal: Positive for joint pain. Negative for myalgias.  Neurological: Negative.  Negative for dizziness, focal weakness, seizures and headaches.  Endo/Heme/Allergies: Positive for environmental allergies.  Psychiatric/Behavioral: Positive for depression. Negative for suicidal ideas. The patient is nervous/anxious.     Past Medical History:  Diagnosis Date  . Arthritis   . Depression   . Diabetes mellitus without complication ([id number] NEW O[facility]ET    WITH FAMILY HISTORY  . DVT (deep venous thrombosis) ([facility]  . Essential hypertension   . Gallstones 05/2016  . GERD (gastroesophageal reflux disease)   . Sickle cell anemia (HCC)    Sickle trait  . Sleep apnea    Cpap    Past Surgical History:  Procedure Laterality Date  . CHOLECYSTECTOMY N/A 06/07/2016   Procedure: LAPAROSCOPIC CHOLECYSTECTOMY WITH ATTEMPTED INTRAOPERATIVE CHOLANGIOGRAM;  Surgeon: BuGeorganna SkeansMD;  [facility]: [facility];  Laterality: N/A;  . LIGAMENT REPAIR     R forearm  . TOTAL KNEE ARTHROPLASTY Right 04/17/2017   Procedure: RIGHT TOTAL KNEE ARTHROPLASTY;  Surgeon: XuLeandrew KoyanagiMD;  [facility];  Laterality: Right;  . TOTAL KNEE ARTHROPLASTY Left 04/28/2018   Procedure: LEFT TOTAL KNEE ARTHROPLASTY;  Surgeon: XuLeandrew KoyanagiMD;  [facility];  Laterality: Left;    Family History  Problem Relation Age of Onset  . Diabetes Mother   . Diabetes Father   . Hypertension Sister     Social History Reviewed with no changes to be made today.   Outpatient Medications Prior to Visit  Medication Sig Dispense Refill  . Blood Glucose Monitoring Suppl (TRUE METRIX  METER) DEVI 1 each by Does not apply route 3 (three) times daily before meals. 1 Device 0  . atorvastatin (LIPITOR) 20 MG tablet Take 1 tablet (20 mg total) by mouth daily. 30 tablet 5  . cetirizine (ZYRTEC) 10 MG tablet TAKE 1 TABLET BY MOUTH DAILY. (Patient taking differently: Take 10 mg by mouth daily.) 30 tablet 1  . FLUoxetine (PROZAC) 20 MG tablet Take 2 tablets (40 mg total)  by mouth daily. 60 tablet 5  . gabapentin (NEURONTIN) 300 MG capsule Take 1 capsule (300 mg total) by mouth 3 (three) times daily. 90 capsule 5  . glipiZIDE (GLUCOTROL) 10 MG tablet Take 1 tablet (10 mg total) by mouth 2 (two) times daily before a meal. 60 tablet 5  . glucose blood (TRUE METRIX BLOOD GLUCOSE TEST) test strip Used daily before meals 100 each 12  . hydrochlorothiazide (HYDRODIURIL) 25 MG tablet Take 1 tablet (25 mg total) by mouth daily. 30 tablet 5  . hydrOXYzine (ATARAX/VISTARIL) 25 MG tablet Take 1 tablet (25 mg total) by mouth every 6 (six) hours. 12 tablet 0  . liraglutide (VICTOZA) 18 MG/3ML SOPN Inject 0.3 mLs (1.8 mg total) into the skin daily with breakfast. 30 mL 5  . lisinopril (ZESTRIL) 10 MG tablet Take 1 tablet (10 mg total) by mouth daily. 30 tablet 5  . metFORMIN (GLUCOPHAGE) 500 MG tablet Take 2 tablets (1,000 mg total) by mouth 2 (two) times daily with a meal. 120 tablet 5  . methocarbamol (ROBAXIN) 750 MG tablet Take 1 tablet (750 mg total) by mouth 2 (two) times daily as needed for muscle spasms. 60 tablet 1  . sildenafil (VIAGRA) 50 MG tablet Take 50 mg by mouth daily as needed for erectile dysfunction (at least 24 hours between doses).    . traMADol (ULTRAM) 50 MG tablet Take 1 tablet (50 mg total) by mouth every 12 (twelve) hours as needed. 1-2 pills every 6 hours as needed for pain 60 tablet 1  . TRUEPLUS LANCETS 28G MISC Use daily before meals 30 each 12  . warfarin (COUMADIN) 5 MG tablet Take 1 and 1/2 (1.5) tablets every day as directed by the [facility]madin Clinic. 45 tablet 2  .  amoxicillin-clavulanate (AUGMENTIN) 875-125 MG tablet Take 1 tablet by mouth every 12 (twelve) hours. (Patient not taking: Reported on 01/09/2021) 14 tablet 0   No facility-administered medications prior to visit.    No Known Allergies     Objective:    BP (!) 142/91   Pulse 92   Ht 5' 9"  (1.753 m)   Wt 280 lb (127 kg)   SpO2 95%   BMI 41.35 kg/m  Wt Readings from Last 3 Encounters:  01/09/21 280 lb (127 kg)  12/11/18 281 lb (127.5 kg)  11/20/18 281 lb 14.4 oz (127.9 kg)    Physical Exam Vitals and nursing note reviewed.  Constitutional:      Appearance: He is well-developed.  HENT:     Head: Normocephalic and atraumatic.  Cardiovascular:     Rate and Rhythm: Normal rate and regular rhythm.     Heart sounds: Normal heart sounds. No murmur heard. No friction rub. No gallop.   Pulmonary:     Effort: Pulmonary effort is normal. No tachypnea or respiratory distress.     Breath sounds: Normal breath sounds. No decreased breath sounds, wheezing, rhonchi or rales.  Chest:     Chest wall: No tenderness.  Abdominal:     General: Bowel sounds are normal.     Palpations: Abdomen is soft.  Musculoskeletal:        General: Normal range of motion.     Cervical back: Normal range of motion.     Right knee: Swelling present. Normal range of motion. Tenderness present over the medial joint line and patellar tendon.     Left knee: Swelling present. Normal range of motion. Tenderness present over the medial joint line and patellar tendon.  Skin:    General: Skin is warm and dry.     Findings: Rash present. Rash is macular.       Neurological:     Mental Status: He is alert and oriented to person, place, and time.     Coordination: Coordination normal.  Psychiatric:        Behavior: Behavior normal. Behavior is cooperative.        Thought Content: Thought content normal.        Judgment: Judgment normal.          Patient has been counseled extensively about nutrition and  exercise as well as the importance of adherence with medications and regular follow-up. The patient was given clear instructions to go to ER or return to medical center if symptoms don't improve, worsen or new problems develop. The patient verbalized understanding.   Follow-up: Return in about 3 months (around 04/10/2021).   Gildardo Pounds, FNP-BC [facility] and [facility] [place], [place]   01/12/2021, 6:17 PM

## 2021-01-09 NOTE — Progress Notes (Signed)
Discuss BP medication. Sleep apnea test. Left knee pain. Needs medication refills.

## 2021-01-10 ENCOUNTER — Other Ambulatory Visit: Payer: Self-pay

## 2021-01-12 ENCOUNTER — Encounter: Payer: Self-pay | Admitting: Nurse Practitioner

## 2021-01-13 ENCOUNTER — Other Ambulatory Visit: Payer: Self-pay

## 2021-01-13 ENCOUNTER — Telehealth: Payer: Self-pay

## 2021-01-13 NOTE — Telephone Encounter (Signed)
-----   Message from Zelda W Fleming, NP sent at 01/12/2021  6:30 PM EDT ----- Cholesterol levels are elevated. Make sure you are taking your atorvastatin every day as prescribed to help reduce your risk of heart attack or stroke. Kidney, liver function and electrolytes are normal.  Your white blood count is elevated. This could be a sign of infection or inflammation. We will need repeat in 2 weeks . Please make a lab appointment. Also remember to turn in your financial aid application so we can get your knees evaluated as soon as possible.  

## 2021-01-13 NOTE — Telephone Encounter (Signed)
Attempted to call pt no answer lvm to call back

## 2021-01-16 ENCOUNTER — Other Ambulatory Visit: Payer: Self-pay

## 2021-01-16 NOTE — Telephone Encounter (Signed)
-----   Message from Claiborne Rigg, NP sent at 01/12/2021  6:30 PM EDT ----- Cholesterol levels are elevated. Make sure you are taking your atorvastatin every day as prescribed to help reduce your risk of heart attack or stroke. Kidney, liver function and electrolytes are normal.  Your white blood count is elevated. This could be a sign of infection or inflammation. We will need repeat in 2 weeks . Please make a lab appointment. Also remember to turn in your financial aid application so we can get your knees evaluated as soon as possible.

## 2021-01-16 NOTE — Telephone Encounter (Signed)
Pt informed of lab results and scheduled for repeat lab in 2 weeks. Pt verbalized understanding and voiced no other concerns.

## 2021-01-20 LAB — CBC
Hematocrit: 51.6 % — ABNORMAL HIGH (ref 37.5–51.0)
Hemoglobin: 17.1 g/dL (ref 13.0–17.7)
MCH: 28.1 pg (ref 26.6–33.0)
MCHC: 33.1 g/dL (ref 31.5–35.7)
MCV: 85 fL (ref 79–97)
Platelets: 198 10*3/uL (ref 150–450)
RBC: 6.09 x10E6/uL — ABNORMAL HIGH (ref 4.14–5.80)
RDW: 14 % (ref 11.6–15.4)
WBC: 13.3 10*3/uL — ABNORMAL HIGH (ref 3.4–10.8)

## 2021-01-20 LAB — LIPID PANEL
Chol/HDL Ratio: 4.1 ratio (ref 0.0–5.0)
Cholesterol, Total: 183 mg/dL (ref 100–199)
HDL: 45 mg/dL (ref >39)
LDL Chol Calc (NIH): 102 mg/dL — ABNORMAL HIGH (ref 0–99)
Triglycerides: 207 mg/dL — ABNORMAL HIGH (ref 0–149)
VLDL Cholesterol Cal: 36 mg/dL (ref 5–40)

## 2021-01-20 LAB — CMP14+EGFR
ALT: 38 IU/L (ref 0–44)
AST: 13 IU/L (ref 0–40)
Albumin/Globulin Ratio: 1.7 (ref 1.2–2.2)
Albumin: 4.7 g/dL (ref 3.8–4.9)
Alkaline Phosphatase: 80 IU/L (ref 44–121)
BUN/Creatinine Ratio: 10 (ref 9–20)
BUN: 11 mg/dL (ref 6–24)
Bilirubin Total: 0.3 mg/dL (ref 0.0–1.2)
CO2: 22 mmol/L (ref 20–29)
Calcium: 9.5 mg/dL (ref 8.7–10.2)
Chloride: 100 mmol/L (ref 96–106)
Creatinine, Ser: 1.05 mg/dL (ref 0.76–1.27)
Globulin, Total: 2.8 g/dL (ref 1.5–4.5)
Glucose: 242 mg/dL — ABNORMAL HIGH (ref 65–99)
Potassium: 4.5 mmol/L (ref 3.5–5.2)
Sodium: 138 mmol/L (ref 134–144)
Total Protein: 7.5 g/dL (ref 6.0–8.5)
eGFR: 86 mL/min/{1.73_m2} (ref >59)

## 2021-01-20 LAB — DRUG SCREEN 10 W/CONF, SERUM
Amphetamines, IA: NEGATIVE ng/mL
Barbiturates, IA: NEGATIVE ug/mL
Benzodiazepines, IA: NEGATIVE ng/mL
Cocaine & Metabolite, IA: NEGATIVE ng/mL
Methadone, IA: NEGATIVE ng/mL
Opiates, IA: NEGATIVE ng/mL
Oxycodones, IA: NEGATIVE ng/mL
Phencyclidine, IA: NEGATIVE ng/mL
Propoxyphene, IA: NEGATIVE ng/mL
THC(Marijuana) Metabolite, IA: POSITIVE ng/mL — AB

## 2021-01-20 LAB — THC,MS,WB/SP RFX
Cannabidiol: NEGATIVE ng/mL
Cannabinoid Confirmation: POSITIVE
Cannabinol: NEGATIVE ng/mL
Carboxy-THC: 9.2 ng/mL
Hydroxy-THC: NEGATIVE ng/mL
Tetrahydrocannabinol(THC): NEGATIVE ng/mL

## 2021-01-24 ENCOUNTER — Other Ambulatory Visit: Payer: Self-pay

## 2021-01-24 ENCOUNTER — Ambulatory Visit: Payer: Self-pay | Attending: Nurse Practitioner

## 2021-01-24 ENCOUNTER — Other Ambulatory Visit: Payer: Self-pay | Admitting: Nurse Practitioner

## 2021-01-24 DIAGNOSIS — D72829 Elevated white blood cell count, unspecified: Secondary | ICD-10-CM

## 2021-01-24 DIAGNOSIS — Z1159 Encounter for screening for other viral diseases: Secondary | ICD-10-CM

## 2021-01-25 LAB — CBC WITH DIFFERENTIAL/PLATELET
Basophils Absolute: 0.1 10*3/uL (ref 0.0–0.2)
Basos: 0 %
EOS (ABSOLUTE): 0.3 10*3/uL (ref 0.0–0.4)
Eos: 2 %
Hematocrit: 48.8 % (ref 37.5–51.0)
Hemoglobin: 16.8 g/dL (ref 13.0–17.7)
Immature Grans (Abs): 0 10*3/uL (ref 0.0–0.1)
Immature Granulocytes: 0 %
Lymphocytes Absolute: 3.6 10*3/uL — ABNORMAL HIGH (ref 0.7–3.1)
Lymphs: 27 %
MCH: 28.8 pg (ref 26.6–33.0)
MCHC: 34.4 g/dL (ref 31.5–35.7)
MCV: 84 fL (ref 79–97)
Monocytes Absolute: 1.2 10*3/uL — ABNORMAL HIGH (ref 0.1–0.9)
Monocytes: 9 %
Neutrophils Absolute: 8.4 10*3/uL — ABNORMAL HIGH (ref 1.4–7.0)
Neutrophils: 62 %
Platelets: 203 10*3/uL (ref 150–450)
RBC: 5.83 x10E6/uL — ABNORMAL HIGH (ref 4.14–5.80)
RDW: 13.1 % (ref 11.6–15.4)
WBC: 13.6 10*3/uL — ABNORMAL HIGH (ref 3.4–10.8)

## 2021-01-25 LAB — HCV INTERPRETATION

## 2021-01-25 LAB — HCV AB W REFLEX TO QUANT PCR: HCV Ab: <0.1 s/co ratio (ref 0.0–0.9)

## 2021-03-01 ENCOUNTER — Other Ambulatory Visit: Payer: Self-pay

## 2021-04-10 ENCOUNTER — Other Ambulatory Visit: Payer: Self-pay

## 2021-04-11 ENCOUNTER — Telehealth: Payer: Self-pay | Admitting: Nurse Practitioner

## 2021-04-11 ENCOUNTER — Other Ambulatory Visit: Payer: Self-pay

## 2021-04-11 ENCOUNTER — Ambulatory Visit: Payer: Medicaid Other | Attending: Nurse Practitioner | Admitting: Nurse Practitioner

## 2021-04-11 NOTE — Telephone Encounter (Signed)
Provider will be out as of this morning called to inform Pt of the change and to expect a virtual visit but no answer message was left

## 2021-04-11 NOTE — Telephone Encounter (Signed)
Unable to reach. LVM. Will call back in 10 min

## 2021-04-11 NOTE — Telephone Encounter (Signed)
Unable to reach. LVM to reschedule appt for office visit.

## 2021-05-30 ENCOUNTER — Ambulatory Visit: Payer: Medicaid Other | Admitting: Nurse Practitioner

## 2021-06-01 ENCOUNTER — Ambulatory Visit: Payer: Medicaid Other | Admitting: Physician Assistant

## 2021-06-01 DIAGNOSIS — E1169 Type 2 diabetes mellitus with other specified complication: Secondary | ICD-10-CM

## 2021-07-06 ENCOUNTER — Other Ambulatory Visit: Payer: Self-pay

## 2021-07-06 ENCOUNTER — Ambulatory Visit: Payer: Medicaid Other | Attending: Physician Assistant | Admitting: Physician Assistant

## 2021-07-06 ENCOUNTER — Encounter: Payer: Self-pay | Admitting: Physician Assistant

## 2021-07-06 VITALS — BP 157/98 | HR 87 | Ht 69.0 in | Wt 285.0 lb

## 2021-07-06 DIAGNOSIS — G4733 Obstructive sleep apnea (adult) (pediatric): Secondary | ICD-10-CM

## 2021-07-06 DIAGNOSIS — M25561 Pain in right knee: Secondary | ICD-10-CM

## 2021-07-06 DIAGNOSIS — Z23 Encounter for immunization: Secondary | ICD-10-CM

## 2021-07-06 DIAGNOSIS — M25562 Pain in left knee: Secondary | ICD-10-CM

## 2021-07-06 DIAGNOSIS — G8929 Other chronic pain: Secondary | ICD-10-CM

## 2021-07-06 DIAGNOSIS — E1169 Type 2 diabetes mellitus with other specified complication: Secondary | ICD-10-CM

## 2021-07-06 DIAGNOSIS — Z91199 Patient's noncompliance with other medical treatment and regimen due to unspecified reason: Secondary | ICD-10-CM

## 2021-07-06 LAB — POCT GLYCOSYLATED HEMOGLOBIN (HGB A1C): Hemoglobin A1C: 9.6 % — AB (ref 4.0–5.6)

## 2021-07-06 LAB — GLUCOSE, POCT (MANUAL RESULT ENTRY): POC Glucose: 328 mg/dl — AB (ref 70–99)

## 2021-07-06 NOTE — Progress Notes (Signed)
David Dunlap, is a 50 y.o. male  NWG:[id number]  VHQ:[id number]  DOB - Nov 22, 1970  Chief Complaint  Patient presents with   Diabetes       Subjective:   David Dunlap is a 50 y.o. male here today fbc he wants to get a new sleep study.  His last one was about 5-6 years ago.  He is NOT taking any of his meds-at least not for a few months bc he has not had them RF.    Denies any issues or concerns  No problems updated.  ALLERGIES: No Known Allergies  PAST MEDICAL HISTORY: Past Medical History:  Diagnosis Date   Arthritis    Depression    Diabetes mellitus without complication (HCC) 05/2016   NEW ONSET    WITH FAMILY HISTORY   DVT (deep venous thrombosis) (HCC)    Essential hypertension    Gallstones 05/2016   GERD (gastroesophageal reflux disease)    Sickle cell anemia (HCC)    Sickle trait   Sleep apnea    Cpap    MEDICATIONS AT HOME: Prior to Admission medications   Medication Sig Start Date End Date Taking? Authorizing Provider  acetaminophen-codeine (TYLENOL #3) 300-30 MG tablet Take 1 tablet by mouth every 8 (eight) hours as needed for moderate pain. 01/09/21  Yes Claiborne Rigg, NP  Blood Glucose Monitoring Suppl (TRUE METRIX METER) DEVI 1 each by Does not apply route 3 (three) times daily before meals. 07/25/16  Yes Hoy Register, MD  FLUoxetine (PROZAC) 20 MG tablet Take 2 tablets (40 mg total) by mouth daily. 01/09/21  Yes Claiborne Rigg, NP  gabapentin (NEURONTIN) 300 MG capsule Take 1 capsule (300 mg total) by mouth 3 (three) times daily. 01/09/21  Yes Claiborne Rigg, NP  glipiZIDE (GLUCOTROL) 10 MG tablet Take 1 tablet (10 mg total) by mouth 2 (two) times daily before a meal. 01/09/21  Yes Claiborne Rigg, NP  glucose blood (TRUE METRIX BLOOD GLUCOSE TEST) test strip Used daily before meals 01/09/21  Yes Claiborne Rigg, NP  hydrOXYzine (ATARAX/VISTARIL) 25 MG tablet Take 1 tablet (25 mg total) by mouth every 8 (eight) hours as needed. 01/09/21   Yes Claiborne Rigg, NP  metFORMIN (GLUCOPHAGE) 500 MG tablet Take 2 tablets (1,000 mg total) by mouth 2 (two) times daily with a meal. 01/09/21  Yes Claiborne Rigg, NP  methocarbamol (ROBAXIN) 750 MG tablet Take 1 tablet (750 mg total) by mouth 2 (two) times daily as needed for muscle spasms. 01/09/21  Yes Claiborne Rigg, NP  rivaroxaban (XARELTO) 20 MG TABS tablet Take 1 tablet (20 mg total) by mouth daily with supper. NEEDS PASS 01/09/21  Yes Claiborne Rigg, NP  sildenafil (VIAGRA) 50 MG tablet Take 1 tablet (50 mg total) by mouth daily as needed for erectile dysfunction (at least 24 hours between doses). 01/09/21  Yes Claiborne Rigg, NP  triamcinolone (KENALOG) 0.025 % ointment Apply 1 application topically 2 (two) times daily. 01/09/21  Yes Claiborne Rigg, NP  TRUEplus Lancets 28G MISC Use daily before meals 01/09/21  Yes Claiborne Rigg, NP  atorvastatin (LIPITOR) 20 MG tablet Take 1 tablet (20 mg total) by mouth daily. 01/09/21 04/09/21  Claiborne Rigg, NP  cetirizine (ZYRTEC) 10 MG tablet Take 1 tablet (10 mg total) by mouth daily. 01/09/21 04/09/21  Claiborne Rigg, NP  hydrochlorothiazide (HYDRODIURIL) 25 MG tablet Take 1 tablet (25 mg total) by mouth daily. 01/09/21 04/09/21  Bertram Denver  W, NP  liraglutide (VICTOZA) 18 MG/3ML SOPN Inject 1.8 mg into the skin daily with breakfast. 01/09/21 04/09/21  Claiborne Rigg, NP  lisinopril (ZESTRIL) 10 MG tablet Take 1 tablet (10 mg total) by mouth daily. 01/09/21 04/09/21  Claiborne Rigg, NP    ROS: Neg HEENT Neg resp Neg cardiac Neg GI Neg GU Neg MS Neg psych Neg neuro  Objective:   Vitals:   07/06/21 1447  BP: (!) 157/98  Pulse: 87  SpO2: 94%  Weight: 285 lb (129.3 kg)  Height: 5\' 9"  (1.753 m)   Exam General appearance : Awake, alert, not in any distress. Speech Clear. Not toxic looking HEENT: Atraumatic and Normocephalic Neck: Supple, no JVD. No cervical lymphadenopathy.  Chest: Good air entry bilaterally, CTAB.  No  rales/rhonchi/wheezing CVS: S1 S2 regular, no murmurs.  Extremities: B/L Lower Ext shows no edema, both legs are warm to touch Neurology: Awake alert, and oriented X 3, CN II-XII intact, Non focal Skin: No Rash  Data Review Lab Results  Component Value Date   HGBA1C 9.6 (A) 07/06/2021   HGBA1C 8.7 (A) 01/09/2021   HGBA1C 7.9 (A) 11/17/2018    Assessment & Plan   1. Type 2 diabetes mellitus with other specified complication, without long-term current use of insulin (HCC) Uncontrolled and not taking meds.  Discussed with pharmacy and they are RF his meds - Glucose (CBG) - HgB A1c  2. Chronic pain of both knees Followed by ortho  3. Obstructive sleep apnea - Split night study; Future  4. Noncompliance Resume all meds-discussed with pharmacy and they are going to RF all    Patient have been counseled extensively about nutrition and exercise. Other issues discussed during this visit include: low cholesterol diet, weight control and daily exercise, foot care, annual eye examinations at Ophthalmology, importance of adherence with medications and regular follow-up. We also discussed long term complications of uncontrolled diabetes and hypertension.   Return in about 3 months (around 10/06/2021) for PCP/chronic conditions.  The patient was given clear instructions to go to ER or return to medical center if symptoms don't improve, worsen or new problems develop. The patient verbalized understanding. The patient was told to call to get lab results if they haven't heard anything in the next week.      10/08/2021, PA-C [facility], [place]   07/06/2021, 3:16 PM Patient ID: David Dunlap, male   DOB: 23-Nov-1970, 50 y.o.   MRN: [id number]

## 2021-07-06 NOTE — Patient Instructions (Signed)
Resume all meds

## 2021-07-17 ENCOUNTER — Other Ambulatory Visit: Payer: Self-pay

## 2021-07-26 ENCOUNTER — Telehealth: Payer: Self-pay | Admitting: Nurse Practitioner

## 2021-07-26 ENCOUNTER — Other Ambulatory Visit: Payer: Self-pay

## 2021-07-26 NOTE — Telephone Encounter (Signed)
Carles came into clinic wanting to know why he hasn't been called yet to do his sleep study. Advised will let nurse to schedule for him.

## 2021-07-27 NOTE — Telephone Encounter (Signed)
Please follow up with this..

## 2021-08-01 NOTE — Telephone Encounter (Signed)
Called to inform pt of sleep study appointment but no vm phone kept ringing. Appointment is September 12, 2021 at 8 pm at [facility]

## 2021-08-01 NOTE — Telephone Encounter (Signed)
Pt called asking about the status of the referral for his sleep study.  CB#  [phone]

## 2021-08-01 NOTE — Telephone Encounter (Signed)
Please follow up with pt

## 2021-09-12 ENCOUNTER — Ambulatory Visit (HOSPITAL_BASED_OUTPATIENT_CLINIC_OR_DEPARTMENT_OTHER): Payer: Self-pay | Attending: Physician Assistant | Admitting: Internal Medicine

## 2021-09-12 ENCOUNTER — Other Ambulatory Visit: Payer: Self-pay

## 2021-09-12 VITALS — Ht 69.0 in | Wt 265.0 lb

## 2021-09-12 DIAGNOSIS — G4733 Obstructive sleep apnea (adult) (pediatric): Secondary | ICD-10-CM | POA: Insufficient documentation

## 2021-09-17 DIAGNOSIS — G4733 Obstructive sleep apnea (adult) (pediatric): Secondary | ICD-10-CM

## 2021-09-17 NOTE — Procedures (Signed)
Patient Name: David Dunlap, David Dunlap Date: 09/12/2021 Gender: Male D.O.B: 09/01/1971 Age (years): 50 Referring Provider: Argentina Donovan PA-C Height (inches): 63 Interpreting Physician: Baird Lyons MD, ABSM Weight (lbs): 265 RPSGT: Carolin Coy BMI: 72 MRN: [id number]  CLINICAL INFORMATION Sleep Study Type: Split Night CPAP Indication for sleep study: Diabetes, Excessive Daytime Sleepiness, Fatigue, Hypertension, Obesity, OSA, Snoring Epworth Sleepiness Score: 6  SLEEP STUDY TECHNIQUE As per the AASM Manual for the Scoring of Sleep and Associated Events v2.3 (April 2016) with a hypopnea requiring 4% desaturations.  The channels recorded and monitored were frontal, central and occipital EEG, electrooculogram (EOG), submentalis EMG (chin), nasal and oral airflow, thoracic and abdominal wall motion, anterior tibialis EMG, snore microphone, electrocardiogram, and pulse oximetry. Continuous positive airway pressure (CPAP) was initiated when the patient met split night criteria and was titrated according to treat sleep-disordered breathing.  MEDICATIONS Medications self-administered by patient taken the night of the study : N/A  RESPIRATORY PARAMETERS Diagnostic  Total AHI (/hr): 56.5 RDI (/hr): 59.9 OA Index (/hr): 31.2 CA Index (/hr): 0.0 REM AHI (/hr): 68.6 NREM AHI (/hr): 54.9 Supine AHI (/hr): 85.8 Non-supine AHI (/hr): 10 Min O2 Sat (%): 68.0 Mean O2 (%): 88.6 Time below 88% (min): 48.2   Titration  Optimal Pressure (cm): 19 AHI at Optimal Pressure (/hr): 1.9 Min O2 at Optimal Pressure (%): 94.0 Supine % at Optimal (%): 100 Sleep % at Optimal (%): 98   SLEEP ARCHITECTURE The recording time for the entire night was 400.3 minutes.  During a baseline period of 206.6 minutes, the patient slept for 140.3 minutes in REM and nonREM, yielding a sleep efficiency of 67.9%%. Sleep onset after lights out was 37.4 minutes with a REM latency of 63.5 minutes. The patient spent  35.7%% of the night in stage N1 sleep, 53.1%% in stage N2 sleep, 0.0%% in stage N3 and 11.2% in REM.  During the titration period of 190.2 minutes, the patient slept for 180.4 minutes in REM and nonREM, yielding a sleep efficiency of 94.9%%. Sleep onset after CPAP initiation was 0.3 minutes with a REM latency of 29.0 minutes. The patient spent 21.8%% of the night in stage N1 sleep, 43.5%% in stage N2 sleep, 0.0%% in stage N3 and 34.6% in REM.  CARDIAC DATA The 2 lead EKG demonstrated sinus rhythm. The mean heart rate was 72.0 beats per minute. Other EKG findings include: PVCs.  LEG MOVEMENT DATA The total Periodic Limb Movements of Sleep (PLMS) were 0. The PLMS index was 0.0 .  IMPRESSIONS - Severe obstructive sleep apnea occurred during the diagnostic portion of the study (AHI = 56.5/hour). An optimal PAP pressure was selected for this patient ( 19 cm of water) - No significant central sleep apnea occurred during the diagnostic portion of the study (CAI = 0.0/hour). - Severe oxygen desaturation was noted during the diagnostic portion of the study (Min O2 = 68.0%). Minimum O2 satiuration with CPAP 19 was 94%. - The patient snored with moderate snoring volume during the diagnostic portion of the study. - EKG findings include PVCs. - Clinically significant periodic limb movements did not occur during sleep.  DIAGNOSIS - Obstructive Sleep Apnea (G47.33)  RECOMMENDATIONS - Trial of CPAP therapy on 19 cm H2O or autopap 10-20. - Patient used a Large size Resmed Full Face Mask AirFit F20 mask and heated humidification. - Be careful with alcohol, sedatives and other CNS depressants that may worsen sleep apnea and disrupt normal sleep architecture. - Sleep hygiene should be reviewed  to assess factors that may improve sleep quality. - Weight management and regular exercise should be initiated or continued.  [Electronically signed] 09/17/2021 12:41 PM  Baird Lyons MD, [place], American  Board of Sleep Medicine   NPI: [id number]                         [place], [place] [facility]  ELECTRONICALLY SIGNED ON:  09/17/2021, 12:28 PM [place] PH: [phone]   FX: [phone] [place]

## 2021-09-21 ENCOUNTER — Telehealth: Payer: Self-pay | Admitting: Nurse Practitioner

## 2021-09-21 NOTE — Telephone Encounter (Signed)
Patient inquiring about sleep study results

## 2021-09-22 ENCOUNTER — Telehealth: Payer: Self-pay

## 2021-09-22 NOTE — Telephone Encounter (Signed)
Has a Mychart visit  with David Dunlap on 09/26/2021

## 2021-09-22 NOTE — Telephone Encounter (Signed)
Will make virtual appt

## 2021-09-26 ENCOUNTER — Encounter: Payer: Self-pay | Admitting: Nurse Practitioner

## 2021-09-26 ENCOUNTER — Ambulatory Visit: Payer: Medicaid Other | Attending: Nurse Practitioner | Admitting: Nurse Practitioner

## 2021-09-26 ENCOUNTER — Other Ambulatory Visit: Payer: Self-pay

## 2021-09-26 ENCOUNTER — Telehealth: Payer: Self-pay

## 2021-09-26 DIAGNOSIS — I10 Essential (primary) hypertension: Secondary | ICD-10-CM

## 2021-09-26 DIAGNOSIS — N529 Male erectile dysfunction, unspecified: Secondary | ICD-10-CM

## 2021-09-26 DIAGNOSIS — G4733 Obstructive sleep apnea (adult) (pediatric): Secondary | ICD-10-CM | POA: Diagnosis not present

## 2021-09-26 DIAGNOSIS — Z86718 Personal history of other venous thrombosis and embolism: Secondary | ICD-10-CM

## 2021-09-26 DIAGNOSIS — E1142 Type 2 diabetes mellitus with diabetic polyneuropathy: Secondary | ICD-10-CM | POA: Diagnosis not present

## 2021-09-26 DIAGNOSIS — G8929 Other chronic pain: Secondary | ICD-10-CM

## 2021-09-26 DIAGNOSIS — E1169 Type 2 diabetes mellitus with other specified complication: Secondary | ICD-10-CM

## 2021-09-26 DIAGNOSIS — F419 Anxiety disorder, unspecified: Secondary | ICD-10-CM

## 2021-09-26 DIAGNOSIS — F32A Depression, unspecified: Secondary | ICD-10-CM

## 2021-09-26 DIAGNOSIS — M25561 Pain in right knee: Secondary | ICD-10-CM

## 2021-09-26 DIAGNOSIS — M25562 Pain in left knee: Secondary | ICD-10-CM

## 2021-09-26 DIAGNOSIS — R21 Rash and other nonspecific skin eruption: Secondary | ICD-10-CM

## 2021-09-26 MED ORDER — HYDROCHLOROTHIAZIDE 25 MG PO TABS
25.0000 mg | ORAL_TABLET | Freq: Every day | ORAL | 1 refills | Status: DC
  Filled 2021-09-26 – 2021-12-27 (×2): qty 30, 30d supply, fill #0

## 2021-09-26 MED ORDER — GLIPIZIDE 10 MG PO TABS
10.0000 mg | ORAL_TABLET | Freq: Two times a day (BID) | ORAL | 5 refills | Status: DC
  Filled 2021-09-26 – 2021-12-27 (×2): qty 60, 30d supply, fill #0

## 2021-09-26 MED ORDER — ATORVASTATIN CALCIUM 20 MG PO TABS
20.0000 mg | ORAL_TABLET | Freq: Every day | ORAL | 3 refills | Status: DC
  Filled 2021-09-26: qty 30, 30d supply, fill #0
  Filled 2021-12-27: qty 90, 90d supply, fill #0

## 2021-09-26 MED ORDER — TRUE METRIX BLOOD GLUCOSE TEST VI STRP
ORAL_STRIP | 12 refills | Status: AC
Start: 2021-09-26 — End: ?
  Filled 2021-09-26: qty 100, 25d supply, fill #0
  Filled 2021-12-27: qty 100, 33d supply, fill #0

## 2021-09-26 MED ORDER — TRIAMCINOLONE ACETONIDE 0.025 % EX OINT
1.0000 "application " | TOPICAL_OINTMENT | Freq: Two times a day (BID) | CUTANEOUS | 0 refills | Status: DC | PRN
  Filled 2021-09-26: qty 60, 30d supply, fill #0

## 2021-09-26 MED ORDER — SILDENAFIL CITRATE 50 MG PO TABS
50.0000 mg | ORAL_TABLET | Freq: Every day | ORAL | 1 refills | Status: DC | PRN
  Filled 2021-09-26 – 2021-12-27 (×2): qty 10, 30d supply, fill #0

## 2021-09-26 MED ORDER — RIVAROXABAN 20 MG PO TABS
20.0000 mg | ORAL_TABLET | Freq: Every day | ORAL | 3 refills | Status: DC
  Filled 2021-09-26 – 2021-11-27 (×2): qty 30, 30d supply, fill #0
  Filled 2021-12-27: qty 30, 30d supply, fill #1

## 2021-09-26 MED ORDER — METHOCARBAMOL 750 MG PO TABS
750.0000 mg | ORAL_TABLET | Freq: Two times a day (BID) | ORAL | 1 refills | Status: DC | PRN
  Filled 2021-09-26 – 2021-12-27 (×2): qty 60, 30d supply, fill #0

## 2021-09-26 MED ORDER — FLUOXETINE HCL 20 MG PO TABS
40.0000 mg | ORAL_TABLET | Freq: Every day | ORAL | 3 refills | Status: DC
  Filled 2021-09-26 – 2021-12-27 (×2): qty 60, 30d supply, fill #0

## 2021-09-26 MED ORDER — LISINOPRIL 10 MG PO TABS
10.0000 mg | ORAL_TABLET | Freq: Every day | ORAL | 1 refills | Status: DC
  Filled 2021-09-26 – 2021-12-27 (×2): qty 30, 30d supply, fill #0

## 2021-09-26 MED ORDER — HYDROXYZINE HCL 25 MG PO TABS
25.0000 mg | ORAL_TABLET | Freq: Three times a day (TID) | ORAL | 1 refills | Status: DC | PRN
  Filled 2021-09-26 – 2021-12-27 (×2): qty 60, 20d supply, fill #0

## 2021-09-26 MED ORDER — VICTOZA 18 MG/3ML ~~LOC~~ SOPN
1.8000 mg | PEN_INJECTOR | Freq: Every day | SUBCUTANEOUS | 3 refills | Status: DC
  Filled 2021-09-26 – 2021-10-24 (×2): qty 9, 30d supply, fill #0
  Filled 2021-11-27: qty 9, 30d supply, fill #1
  Filled 2021-12-27: qty 9, 30d supply, fill #2

## 2021-09-26 MED ORDER — TRUEPLUS LANCETS 28G MISC
12 refills | Status: AC
  Filled 2021-09-26: qty 100, 25d supply, fill #0
  Filled 2021-12-27: qty 100, 33d supply, fill #0

## 2021-09-26 MED ORDER — METFORMIN HCL 500 MG PO TABS
1000.0000 mg | ORAL_TABLET | Freq: Two times a day (BID) | ORAL | 5 refills | Status: DC
  Filled 2021-09-26 (×2): qty 120, 30d supply, fill #0

## 2021-09-26 MED ORDER — GABAPENTIN 300 MG PO CAPS
300.0000 mg | ORAL_CAPSULE | Freq: Three times a day (TID) | ORAL | 5 refills | Status: DC
  Filled 2021-09-26 – 2021-12-27 (×2): qty 90, 30d supply, fill #0

## 2021-09-26 NOTE — Telephone Encounter (Signed)
Call placed to patient regarding sleep study results and recommendation for a CPAP machine. [employer] Medicaid does not pay for CPAP machines and the patient is unable to afford the cost of a new machine.    Informed him about the American Sleep Apnea Association (ASAA) CPAP Assistance Program now that it is up and running again post COVID.   They provide refurbished machines to individuals for a $100 program fee. There is no warranty or technical support that comes with the machine, it is provided " as is."    The program currently reports a wait of about 3.5-4 months.  The patient said that he  is not able to afford the program fee at this time.  Informed him that [employer] has funding available to assist with this.  He agreed to submitting the order for the CPAP machine to [employer]AA and have  the machine delivered to [facility]. When the machine is received at [facility], he will be scheduled for an appointment to meet with a RT from the [facility] for instructions regarding use, care, maintenance of the machine. When he picks up the machine, he will be asked to sign a waiver of liability for the ASAA

## 2021-09-26 NOTE — Telephone Encounter (Signed)
Copied from CRM [id number]. Topic: General - Other >> Sep 26, 2021 12:31 PM Gaetana Michaelis A wrote: Reason for CRM: The patient has returned a missed call from the practice  Please contact further when available __________________________________ Appointment reminder

## 2021-09-26 NOTE — Progress Notes (Signed)
Virtual Visit via Telephone Note Due to national recommendations of social distancing due to COVID 19, telehealth visit is felt to be most appropriate for this patient at this time.  I discussed the limitations, risks, security and privacy concerns of performing an evaluation and management service by telephone and the availability of in person appointments. I also discussed with the patient that there may be a patient responsible charge related to this service. The patient expressed understanding and agreed to proceed.    I connected with David Dunlap on 09/26/21  at   9:10 AM EST  EDT by telephone and verified that I am speaking with the correct person using two identifiers.  Location of Patient: Private Residence   [facility]    Persons participating in Telemedicine visit: Bertram DenverZelda Kelita Wallis FNP-BC [name] Schweizer    History of Present Illness: Telemedicine visit for: SLEEP STUDY RESULTS He has a past medical history of Arthritis, Depression, Diabetes mellitus without complication (HCC) (05/2016), recurrent DVT, Essential hypertension, Gallstones (05/2016), GERD, Sickle cell anemia , and Sleep apnea.   I went over in detail Mr. [name] CPAP results today.  Unfortunately I was not aware he is uninsured. I have spoken to the RN case manager and she will contact him regarding our CPAP program for uninsured patients.   He is requesting a refill of his medications. I am just now being made aware by the patient that he has not been using his Victoza which was prescribed almost a year ago because he does not know how to administer it.  He had an office visit in October however he failed to mention that he was not using his Victoza and did not ask for demonstration at that time.  He does have poorly controlled diabetes and complications of his diabetes include peripheral neuropathy for which she has been prescribed gabapentin.. Lab  Results  Component Value Date   HGBA1C 9.6 (A) 07/06/2021       Past Medical History:  Diagnosis Date   Arthritis    Depression    Diabetes mellitus without complication (HCC) 05/2016   NEW ONSET    WITH FAMILY HISTORY   DVT (deep venous thrombosis) (HCC)    Essential hypertension    Gallstones 05/2016   GERD (gastroesophageal reflux disease)    Sickle cell anemia (HCC)    Sickle trait   Sleep apnea    Cpap    Past Surgical History:  Procedure Laterality Date   CHOLECYSTECTOMY N/A 06/07/2016   Procedure: LAPAROSCOPIC CHOLECYSTECTOMY WITH ATTEMPTED INTRAOPERATIVE CHOLANGIOGRAM;  Surgeon: Violeta GelinasBurke Thompson, MD;  Location: [facility] OR;  Service: General;  Laterality: N/A;   LIGAMENT REPAIR     R forearm   TOTAL KNEE ARTHROPLASTY Right 04/17/2017   Procedure: RIGHT TOTAL KNEE ARTHROPLASTY;  Surgeon: Tarry KosXu, Naiping M, MD;  Location: [facility] OR;  Service: Orthopedics;  Laterality: Right;   TOTAL KNEE ARTHROPLASTY Left 04/28/2018   Procedure: LEFT TOTAL KNEE ARTHROPLASTY;  Surgeon: Tarry KosXu, Naiping M, MD;  Location: [facility] OR;  Service: Orthopedics;  Laterality: Left;    Family History  Problem Relation Age of Onset   Diabetes Mother    Diabetes Father    Hypertension Sister     Social History   Socioeconomic History   Marital status: Single    Spouse name: Not on file   Number of children: Not on file   Years of education: Not on file   Highest education level: Not on file  Occupational History   Not on file  Tobacco Use   Smoking status: Never   Smokeless tobacco: Never  Vaping Use   Vaping Use: Never used  Substance and Sexual Activity   Alcohol use: Yes    Alcohol/week: 1.0 - 2.0 standard drink    Types: 1 - 2 Cans of beer per week    Comment: doesn't drink all the drink   Drug use: No    Comment: last time 1989   Sexual activity: Not on file  Other Topics Concern   Not on file  Social History Narrative   Not on file   Social Determinants of Health   Financial Resource Strain: Not  on file  Food Insecurity: Not on file  Transportation Needs: Not on file  Physical Activity: Not on file  Stress: Not on file  Social Connections: Not on file     Observations/Objective: Awake, alert and oriented x 3   Review of Systems  Constitutional:  Negative for fever, malaise/fatigue and weight loss.  HENT: Negative.  Negative for nosebleeds.   Eyes: Negative.  Negative for blurred vision, double vision and photophobia.  Respiratory: Negative.  Negative for cough and shortness of breath.   Cardiovascular: Negative.  Negative for chest pain, palpitations and leg swelling.  Gastrointestinal: Negative.  Negative for heartburn, nausea and vomiting.  Musculoskeletal: Negative.  Negative for myalgias.  Neurological: Negative.  Negative for dizziness, focal weakness, seizures and headaches.  Psychiatric/Behavioral: Negative.  Negative for suicidal ideas.    Assessment and Plan: Diagnoses and all orders for this visit:  Type 2 diabetes mellitus with other specified complication, without long-term current use of insulin (HCC) -     atorvastatin (LIPITOR) 20 MG tablet; Take 1 tablet (20 mg total) by mouth daily. FOR CHOLESTEROL -     glipiZIDE (GLUCOTROL) 10 MG tablet; Take 1 tablet (10 mg total) by mouth 2 (two) times daily before a meal. FOR DIABETES -     glucose blood (TRUE METRIX BLOOD GLUCOSE TEST) test strip; Used daily before meals -     liraglutide (VICTOZA) 18 MG/3ML SOPN; Inject 1.8 mg into the skin daily with breakfast. FOR DIABETES -     metFORMIN (GLUCOPHAGE) 500 MG tablet; Take 2 tablets (1,000 mg total) by mouth 2 (two) times daily with a meal. FOR DIABETES -     TRUEplus Lancets 28G MISC; Use daily before meals Continue blood sugar control as discussed in office today, low carbohydrate diet, and regular physical exercise as tolerated, 150 minutes per week (30 min each day, 5 days per week, or 50 min 3 days per week). Keep blood sugar logs with fasting goal of 90-130  mg/dl, post prandial (after you eat) less than 180.  For Hypoglycemia: BS <60 and Hyperglycemia BS >400; contact the clinic ASAP. Annual eye exams and foot exams are recommended.   Anxiety and depression -     FLUoxetine (PROZAC) 20 MG tablet; Take 2 tablets (40 mg total) by mouth daily. FOR ANXIETY AND DEPRESSION -     hydrOXYzine (ATARAX) 25 MG tablet; Take 1 tablet (25 mg total) by mouth every 8 (eight) hours as needed. FOR ANXIETY  Diabetic polyneuropathy associated with type 2 diabetes mellitus (HCC) -     gabapentin (NEURONTIN) 300 MG capsule; Take 1 capsule (300 mg total) by mouth 3 (three) times daily. FOR NEUROPATHY  Chronic pain of both knees -     gabapentin (NEURONTIN) 300 MG capsule; Take 1 capsule (300 mg total)  by mouth 3 (three) times daily. FOR NEUROPATHY -     methocarbamol (ROBAXIN) 750 MG tablet; Take 1 tablet (750 mg total) by mouth 2 (two) times daily as needed for muscle spasms.  Primary hypertension -     hydrochlorothiazide (HYDRODIURIL) 25 MG tablet; Take 1 tablet (25 mg total) by mouth daily. FOR HIGH BLOOD PRESSURE -     lisinopril (ZESTRIL) 10 MG tablet; Take 1 tablet (10 mg total) by mouth daily. FOR HIGH BLOOD PRESSURE  History of DVT (deep vein thrombosis) -     rivaroxaban (XARELTO) 20 MG TABS tablet; Take 1 tablet (20 mg total) by mouth daily with supper. NEEDS PASS History of chronic venous embolism and noncompliance with anticoagulants.  Erectile dysfunction, unspecified erectile dysfunction type -     sildenafil (VIAGRA) 50 MG tablet; Take 1 tablet (50 mg total) by mouth daily as needed for erectile dysfunction (at least 24 hours between doses).  Skin rash -     triamcinolone (KENALOG) 0.025 % ointment; Apply 1 application topically 2 (two) times daily as needed. SKIN RASH     Follow Up Instructions Return in about 1 week (around 10/03/2021) for Patient already has appointment scheduled for January 20.     I discussed the assessment and treatment  plan with the patient. The patient was provided an opportunity to ask questions and all were answered. The patient agreed with the plan and demonstrated an understanding of the instructions.   The patient was advised to call back or seek an in-person evaluation if the symptoms worsen or if the condition fails to improve as anticipated.  I provided 16 minutes of non-face-to-face time during this encounter including median intraservice time, reviewing previous notes, labs, imaging, medications and explaining diagnosis and management.  Claiborne Rigg, FNP-BC

## 2021-09-28 NOTE — Telephone Encounter (Signed)
Order has been faxed to [employer] - CPAP assistance program

## 2021-10-02 ENCOUNTER — Other Ambulatory Visit: Payer: Self-pay

## 2021-10-04 ENCOUNTER — Other Ambulatory Visit: Payer: Self-pay

## 2021-10-06 ENCOUNTER — Encounter: Payer: Self-pay | Admitting: Nurse Practitioner

## 2021-10-06 ENCOUNTER — Other Ambulatory Visit: Payer: Self-pay

## 2021-10-06 ENCOUNTER — Ambulatory Visit: Payer: Self-pay | Attending: Nurse Practitioner | Admitting: Nurse Practitioner

## 2021-10-06 VITALS — BP 138/84 | HR 84 | Ht 69.0 in | Wt 284.0 lb

## 2021-10-06 DIAGNOSIS — D72829 Elevated white blood cell count, unspecified: Secondary | ICD-10-CM

## 2021-10-06 DIAGNOSIS — Z1211 Encounter for screening for malignant neoplasm of colon: Secondary | ICD-10-CM

## 2021-10-06 DIAGNOSIS — E1169 Type 2 diabetes mellitus with other specified complication: Secondary | ICD-10-CM

## 2021-10-06 DIAGNOSIS — E785 Hyperlipidemia, unspecified: Secondary | ICD-10-CM

## 2021-10-06 DIAGNOSIS — I10 Essential (primary) hypertension: Secondary | ICD-10-CM

## 2021-10-06 LAB — POCT GLYCOSYLATED HEMOGLOBIN (HGB A1C): Hemoglobin A1C: 10.5 % — AB (ref 4.0–5.6)

## 2021-10-06 LAB — GLUCOSE, POCT (MANUAL RESULT ENTRY): POC Glucose: 270 mg/dl — AB (ref 70–99)

## 2021-10-06 MED ORDER — LANTUS SOLOSTAR 100 UNIT/ML ~~LOC~~ SOPN
10.0000 [IU] | PEN_INJECTOR | Freq: Every day | SUBCUTANEOUS | 1 refills | Status: DC
  Filled 2021-10-06: qty 3, 30d supply, fill #0

## 2021-10-06 NOTE — Progress Notes (Signed)
Assessment & Plan:  David Dunlap was seen today for diabetes.  Diagnoses and all orders for this visit:  Type 2 diabetes mellitus with other specified complication, without long-term current use of insulin (HCC) -     POCT glycosylated hemoglobin (Hb A1C) -     POCT glucose (manual entry) -     insulin glargine (LANTUS SOLOSTAR) 100 UNIT/ML Solostar Pen; Inject 10 Units into the skin daily. -     CMP14+EGFR  Dyslipidemia, goal LDL below 70 -     Lipid panel  Primary hypertension  Leukocytosis, unspecified type -     CBC with Differential -     Ambulatory referral to Hematology / Oncology  Colon cancer screening -     Fecal occult blood, imunochemical([facility])    Patient has been counseled on age-appropriate routine health concerns for screening and prevention. These are reviewed and up-to-date. Referrals have been placed accordingly. Immunizations are up-to-date or declined.    Subjective:   Chief Complaint  Patient presents with   Diabetes   HPI David Dunlap 51 y.o. male presents to office today for follow up to DM And HTN. He has a past medical history of Arthritis, Depression, Diabetes mellitus 2 (05/2016), DVT, Essential hypertension, Gallstones (05/2016), GERD, Sickle cell anemia, and Sleep apnea.   DM 2 Poorly controlled. Adding lantus 10 units today. He is not using his victoza pen because he states it does not work. He will need to see the pharmacist and bring his lantus pen and his victoza pen in for evaluation. He will continue to be prescribed victoza 1.8 mg daily, glipizide 82m BID and metformin 1000 mg BID. Diabetic complications include gabapentin 300 mg TID. LDL not at goal. It does not appear he has picked up his cholesterol medication.  Lab Results  Component Value Date   HGBA1C 10.5 (A) 10/06/2021   Lab Results  Component Value Date   LDLCALC 114 (H) 10/06/2021      HTN Blood pressure is well controlled today. He is taking HCTZ 25 mg  daily and lisinopril 10 mg daily as prescribed.  BP Readings from Last 3 Encounters:  10/06/21 138/84  07/06/21 (!) 157/98  01/09/21 (!) 142/91    Review of Systems  Constitutional:  Negative for fever, malaise/fatigue and weight loss.  HENT: Negative.  Negative for nosebleeds.   Eyes: Negative.  Negative for blurred vision, double vision and photophobia.  Respiratory: Negative.  Negative for cough and shortness of breath.   Cardiovascular: Negative.  Negative for chest pain, palpitations and leg swelling.  Gastrointestinal: Negative.  Negative for heartburn, nausea and vomiting.  Musculoskeletal: Negative.  Negative for myalgias.  Neurological: Negative.  Negative for dizziness, focal weakness, seizures and headaches.  Psychiatric/Behavioral: Negative.  Negative for suicidal ideas.    Past Medical History:  Diagnosis Date   Arthritis    Depression    Di[place] 016[place]  NEW ONSET    WITH FAMILY HISTORY   DVT (deep venous thrombosis) (HCC)    Essential hypertension    Gallstones 05/2016   GERD (gastroesophageal reflux disease)    Sickle cell anemia (HCC)    Sickle trait   Sleep apnea    Cpap    Past Surgical History:  Procedure Laterality Date   CHOLECYSTECTOMY N/A 06/07/2016   Procedure: LAPAROSCOPIC CHOLECYSTECTOMY WITH ATTEMPTED INTRAOPERATIVE CHOLANGIOGRAM;  Surgeon: BGeorganna Skeans MD;  [place] [facility]  Service: General;  Laterality: N/A;   LIGAMENT REPAIR  R forearm   TOTAL KNEE ARTHROPLASTY Right 04/17/2017   Procedure: RIGHT TOTAL KNEE ARTHROPLASTY;  Surgeon: Leandrew Koyanagi, MD;  Location: [place];  Service: Orthopedics;  Laterality: Right;   TOTAL KNEE ARTHROPLASTY Left 04/28/2018   Procedure: LEFT TOTAL KNEE ARTHROPLASTY;  Surgeon: Leandrew Koyanagi, MD;  Location: [place];  Service: Orthopedics;  Laterality: Left;    Family History  Problem Relation Age of Onset   Diabetes Mother    Diabetes Father    Hypertension Sister      Social History Reviewed with no changes to be made today.   Outpatient Medications Prior to Visit  Medication Sig Dispense Refill   atorvastatin (LIPITOR) 20 MG tablet Take 1 tablet (20 mg total) by mouth daily. FOR CHOLESTEROL 90 tablet 3   Blood Glucose Monitoring Suppl (TRUE METRIX METER) DEVI 1 each by Does not apply route 3 (three) times daily before meals. 1 Device 0   FLUoxetine (PROZAC) 20 MG tablet Take 2 tablets (40 mg total) by mouth daily. FOR ANXIETY AND DEPRESSION 60 tablet 3   gabapentin (NEURONTIN) 300 MG capsule Take 1 capsule (300 mg total) by mouth 3 (three) times daily. FOR NEUROPATHY 90 capsule 5   glipiZIDE (GLUCOTROL) 10 MG tablet Take 1 tablet (10 mg total) by mouth 2 (two) times daily before a meal. FOR DIABETES 60 tablet 5   glucose blood (TRUE METRIX BLOOD GLUCOSE TEST) test strip Used daily before meals 100 each 12   hydrochlorothiazide (HYDRODIURIL) 25 MG tablet Take 1 tablet (25 mg total) by mouth daily. FOR HIGH BLOOD PRESSURE 90 tablet 1   hydrOXYzine (ATARAX) 25 MG tablet Take 1 tablet (25 mg total) by mouth every 8 (eight) hours as needed. FOR ANXIETY 60 tablet 1   liraglutide (VICTOZA) 18 MG/3ML SOPN Inject 1.8 mg into the skin daily with breakfast. FOR DIABETES 27 mL 3   lisinopril (ZESTRIL) 10 MG tablet Take 1 tablet (10 mg total) by mouth daily. FOR HIGH BLOOD PRESSURE 90 tablet 1   metFORMIN (GLUCOPHAGE) 500 MG tablet Take 2 tablets (1,000 mg total) by mouth 2 (two) times daily with a meal. FOR DIABETES 120 tablet 5   methocarbamol (ROBAXIN) 750 MG tablet Take 1 tablet (750 mg total) by mouth 2 (two) times daily as needed for muscle spasms. 60 tablet 1   rivaroxaban (XARELTO) 20 MG TABS tablet Take 1 tablet (20 mg total) by mouth daily with supper. NEEDS PASS 90 tablet 3   sildenafil (VIAGRA) 50 MG tablet Take 1 tablet (50 mg total) by mouth daily as needed for erectile dysfunction (at least 24 hours between doses). 10 tablet 1   triamcinolone (KENALOG)  0.025 % ointment Apply 1 application topically 2 (two) times daily as needed. SKIN RASH 60 g 0   TRUEplus Lancets 28G MISC Use daily before meals 30 each 12   cetirizine (ZYRTEC) 10 MG tablet Take 1 tablet (10 mg total) by mouth daily. 90 tablet 1   No facility-administered medications prior to visit.    No Known Allergies     Objective:    BP 138/84    Pulse 84    Ht 5' 9" (1.753 m)    Wt 284 lb (128.8 kg)    SpO2 95%    BMI 41.94 kg/m  Wt Readings from Last 3 Encounters:  10/06/21 284 lb (128.8 kg)  09/12/21 265 lb (120.2 kg)  07/06/21 285 lb (129.3 kg)    Physical Exam Vitals and nursing note reviewed.  Constitutional:      Appearance: He is well-developed.  HENT:     Head: Normocephalic and atraumatic.  Cardiovascular:     Rate and Rhythm: Normal rate and regular rhythm.     Heart sounds: Normal heart sounds. No murmur heard.   No friction rub. No gallop.  Pulmonary:     Effort: Pulmonary effort is normal. No tachypnea or respiratory distress.     Breath sounds: Normal breath sounds. No decreased breath sounds, wheezing, rhonchi or rales.  Chest:     Chest wall: No tenderness.  Abdominal:     General: Bowel sounds are normal.     Palpations: Abdomen is soft.  Musculoskeletal:        General: Normal range of motion.     Cervical back: Normal range of motion.  Skin:    General: Skin is warm and dry.  Neurological:     Mental Status: He is alert and oriented to person, place, and time.     Coordination: Coordination normal.  Psychiatric:        Behavior: Behavior normal. Behavior is cooperative.        Thought Content: Thought content normal.        Judgment: Judgment normal.         Patient has been counseled extensively about nutrition and exercise as well as the importance of adherence with medications and regular follow-up. The patient was given clear instructions to go to ER or return to medical center if symptoms don't improve, worsen or new problems  develop. The patient verbalized understanding.   Follow-up: Return for victoza pen malfunction. Needs teaching. see luke first available. See me in 3 months.   Gildardo Pounds, FNP-BC [facility], [place]   10/08/2021, 8:26 PM

## 2021-10-07 LAB — CBC WITH DIFFERENTIAL/PLATELET
Basophils Absolute: 0.1 10*3/uL (ref 0.0–0.2)
Basos: 1 %
EOS (ABSOLUTE): 0 10*3/uL (ref 0.0–0.4)
Eos: 0 %
Hematocrit: 48.1 % (ref 37.5–51.0)
Hemoglobin: 16.8 g/dL (ref 13.0–17.7)
Immature Grans (Abs): 0.1 10*3/uL (ref 0.0–0.1)
Immature Granulocytes: 1 %
Lymphocytes Absolute: 4 10*3/uL — ABNORMAL HIGH (ref 0.7–3.1)
Lymphs: 28 %
MCH: 28.1 pg (ref 26.6–33.0)
MCHC: 34.9 g/dL (ref 31.5–35.7)
MCV: 81 fL (ref 79–97)
Monocytes Absolute: 1.2 10*3/uL — ABNORMAL HIGH (ref 0.1–0.9)
Monocytes: 8 %
Neutrophils Absolute: 8.9 10*3/uL — ABNORMAL HIGH (ref 1.4–7.0)
Neutrophils: 62 %
Platelets: 216 10*3/uL (ref 150–450)
RBC: 5.97 x10E6/uL — ABNORMAL HIGH (ref 4.14–5.80)
RDW: 13.9 % (ref 11.6–15.4)
WBC: 14.3 10*3/uL — ABNORMAL HIGH (ref 3.4–10.8)

## 2021-10-07 LAB — CMP14+EGFR
ALT: 30 IU/L (ref 0–44)
AST: 10 IU/L (ref 0–40)
Albumin/Globulin Ratio: 1.4 (ref 1.2–2.2)
Albumin: 4.4 g/dL (ref 3.8–4.9)
Alkaline Phosphatase: 80 IU/L (ref 44–121)
BUN/Creatinine Ratio: 17 (ref 9–20)
BUN: 17 mg/dL (ref 6–24)
Bilirubin Total: 0.3 mg/dL (ref 0.0–1.2)
CO2: 22 mmol/L (ref 20–29)
Calcium: 9.4 mg/dL (ref 8.7–10.2)
Chloride: 99 mmol/L (ref 96–106)
Creatinine, Ser: 0.99 mg/dL (ref 0.76–1.27)
Globulin, Total: 3.1 g/dL (ref 1.5–4.5)
Glucose: 243 mg/dL — ABNORMAL HIGH (ref 70–99)
Potassium: 4.6 mmol/L (ref 3.5–5.2)
Sodium: 135 mmol/L (ref 134–144)
Total Protein: 7.5 g/dL (ref 6.0–8.5)
eGFR: 92 mL/min/{1.73_m2} (ref >59)

## 2021-10-07 LAB — LIPID PANEL
Chol/HDL Ratio: 4.8 ratio (ref 0.0–5.0)
Cholesterol, Total: 189 mg/dL (ref 100–199)
HDL: 39 mg/dL — ABNORMAL LOW (ref >39)
LDL Chol Calc (NIH): 114 mg/dL — ABNORMAL HIGH (ref 0–99)
Triglycerides: 203 mg/dL — ABNORMAL HIGH (ref 0–149)
VLDL Cholesterol Cal: 36 mg/dL (ref 5–40)

## 2021-10-08 ENCOUNTER — Encounter: Payer: Self-pay | Admitting: Nurse Practitioner

## 2021-10-09 ENCOUNTER — Telehealth: Payer: Self-pay | Admitting: Hematology

## 2021-10-09 ENCOUNTER — Telehealth: Payer: Self-pay

## 2021-10-09 NOTE — Telephone Encounter (Signed)
-----   Message from Claiborne Rigg, NP sent at 10/08/2021  8:25 PM EST ----- Tresa Endo please make him an appt with luke for victoza pen teaching. First available appt luke has. Thanks  Kidney, liver function and electrolytes are normal.   Cholesterol elevated. Continue cholesterol medication as prescribed to help reduce risk of heart attack or stroke  Referring to Oncology. Blood specialist will be needed to evaluate his white blood count. They will call him to schedule. He needs to make sure he checks his voicemails.

## 2021-10-09 NOTE — Telephone Encounter (Signed)
Called patient reviewed all information and repeated back to me. Will call if any questions.  ? ?

## 2021-10-09 NOTE — Telephone Encounter (Signed)
Scheduled appt per 1/22 referral. Pt is aware of appt date and time. Pt is aware to arrive 15 mins prior to appt time. Per pt request, letter mailed to pt.

## 2021-10-24 ENCOUNTER — Other Ambulatory Visit: Payer: Self-pay

## 2021-10-24 ENCOUNTER — Ambulatory Visit: Payer: Self-pay | Attending: Nurse Practitioner | Admitting: Pharmacist

## 2021-10-24 DIAGNOSIS — E1169 Type 2 diabetes mellitus with other specified complication: Secondary | ICD-10-CM

## 2021-10-24 MED ORDER — TECHLITE PEN NEEDLES 32G X 4 MM MISC
2 refills | Status: DC
  Filled 2021-10-24: qty 100, 50d supply, fill #0

## 2021-10-24 NOTE — Progress Notes (Signed)
Patient was educated on the use of the Victoza and Lantus pens. Reviewed necessary supplies and operation of the pens. Also reviewed goal blood glucose levels. Patient was able to demonstrate use. All questions and concerns were addressed.  Of note, pt was unable to inject as he did not have pen needles. Went over how to attach pen needles. Emphasized the need to change the needle each time.   Time spent counseling face-to-face: 20 minutes  Follow-up: 1 month  [name], PharmD, [place], CPP Clinical Pharmacist [facility] [phone]

## 2021-10-30 ENCOUNTER — Inpatient Hospital Stay: Payer: Medicaid Other | Attending: Hematology | Admitting: Hematology

## 2021-11-09 ENCOUNTER — Telehealth: Payer: Self-pay

## 2021-11-09 NOTE — Telephone Encounter (Signed)
Call received from Alice Rolling/ [employer]- CPAP Assistance Program stating that they have a CPAP machine ready for delivery. This CM paid $100 program fee for patient with [facility] funds. Alice confirmed that the machine will be delivered to [facility] - [address], [address], [place], [place]  ° °

## 2021-11-22 ENCOUNTER — Telehealth: Payer: Self-pay

## 2021-11-22 NOTE — Telephone Encounter (Signed)
Call placed to patient and informed him that the CPAP machine has been delivered and the [facility] will be calling him to schedule a time to come in for teaching and to pick up the machine  ?

## 2021-11-26 NOTE — Progress Notes (Addendum)
? ? ?S:    ?David Dunlap is a 51 y.o. male who presents for diabetes evaluation, education, and management. PMH is significant for T2DM, HTN. Patient was referred and last seen by Primary Care Provider, Bertram Denver, NP, on 10/06/21. Lantus was started. Patient was educated on the use of his Victoza and Lantus pens by CPP on 10/24/21. Today, patient arrives in good spirits and presents without assistance.  ? ?Family/Social History: Diabetes in mother and father ? ?Current diabetes medications include: metformin 1000 mg BID (not taking due to GI AEs), Victoza 1.8 mg daily, Lantus 10 units daily, glipizide 10 mg BIDAC  ? ?Patient states that he is not taking his medications as prescribed. Patient reports adherence with Lantus, Victoza, and glipizide, though he ran out of Lantus and Victoza a day ago. Prior to this he denies any missed doses. He denies taking metformin due to GI adverse effects.  ? ?Do you feel that your medications are working for you? Yes  ?Have you been experiencing any side effects to the medications prescribed? Yes - does not take metformin due to stomach upset ?Do you have any problems obtaining medications due to transportation or finances? Yes - does not have insurance. Needs to sign paperwork for patient assistance for Lantus and Victoza.  ?Insurance coverage: self pay ? ?Patient denies hypoglycemic events. ? ?Reported home fasting blood sugars: Reports it is mostly 100-125, lowest 98, highest 195. Does not have his meter today.  ? ?Patient reports nocturia (nighttime urination). Once/night. ?Patient denies neuropathy (nerve pain). ?Patient denies visual changes. ?Patient reports self foot exams.  ? ?Patient reported dietary habits: Eats 1-2 meals/day. Mostly cooks at home. Baked fish and chicken. Drinks only water.  ? ?Patient-reported exercise habits: Walks every day ? ?O:  ? ?Lab Results  ?Component Value Date  ? HGBA1C 10.5 (A) 10/06/2021  ? ?There were no vitals filed for this  visit. ? ?Lipid Panel  ?   ?Component Value Date/Time  ? CHOL 189 10/06/2021 1439  ? TRIG 203 (H) 10/06/2021 1439  ? HDL 39 (L) 10/06/2021 1439  ? CHOLHDL 4.8 10/06/2021 1439  ? CHOLHDL 4.6 12/08/2014 0942  ? VLDL 24 12/08/2014 0942  ? LDLCALC 114 (H) 10/06/2021 1439  ? ?Clinical Atherosclerotic Cardiovascular Disease (ASCVD): No  ?The 10-year ASCVD risk score (Arnett DK, et al., 2019) is: 21% ?  Values used to calculate the score: ?    Age: 22 years ?    Sex: Male ?    Is Non-Hispanic African American: Yes ?    Diabetic: Yes ?    Tobacco smoker: No ?    Systolic Blood Pressure: 138 mmHg ?    Is BP treated: Yes ?    HDL Cholesterol: 39 mg/dL ?    Total Cholesterol: 189 mg/dL  ? ?A/P: ?Diabetes longstanding currently uncontrolled with most recent A1c 10.5 on 10/06/21. Patient is able to verbalize appropriate hypoglycemia management plan. Medication adherence appears appropriate but is due for refills.  ?-Discontinued metformin to stomach upset. Will add this to his intolerance list. Could consider retrial in the future of a lower dose using the extended release formulation if needed.  ?-Increase Lantus to 12 units daily.  ?-Continue Victoza 1.8 mg daily and glipizide 10 mg BIDAC.  ?-Instructed patient to fill out patient assistance paperwork when he fills Lantus and Victoza today at our pharmacy.  ?-Extensively discussed pathophysiology of diabetes, recommended lifestyle interventions, dietary effects on blood sugar control.  ?-Counseled on s/sx of  and management of hypoglycemia.  ?-Next A1c anticipated April at PCP visit.   ? ?Verbal patient instructions provided. Patient verbalized understanding of treatment plan. Total time in face to face counseling 28 minutes.   ? ?Follow up PCP clinic visit in 1 month. ? ?Pervis Hocking, PharmD ?PGY2 Ambulatory Care Pharmacy Resident ?11/27/2021 10:59 AM ? ?

## 2021-11-27 ENCOUNTER — Ambulatory Visit: Payer: Self-pay | Attending: Nurse Practitioner | Admitting: Pharmacist

## 2021-11-27 ENCOUNTER — Other Ambulatory Visit: Payer: Self-pay

## 2021-11-27 ENCOUNTER — Other Ambulatory Visit: Payer: Self-pay | Admitting: Pharmacist

## 2021-11-27 DIAGNOSIS — E1169 Type 2 diabetes mellitus with other specified complication: Secondary | ICD-10-CM

## 2021-11-27 MED ORDER — BASAGLAR KWIKPEN 100 UNIT/ML ~~LOC~~ SOPN
12.0000 [IU] | PEN_INJECTOR | Freq: Every day | SUBCUTANEOUS | 3 refills | Status: DC
  Filled 2021-11-27: qty 12, 100d supply, fill #0

## 2021-11-27 MED ORDER — LANTUS SOLOSTAR 100 UNIT/ML ~~LOC~~ SOPN
12.0000 [IU] | PEN_INJECTOR | Freq: Every day | SUBCUTANEOUS | 1 refills | Status: DC
  Filled 2021-11-27: qty 3, 25d supply, fill #0

## 2021-11-29 ENCOUNTER — Telehealth: Payer: Self-pay

## 2021-11-29 NOTE — Telephone Encounter (Signed)
Opened in error

## 2021-12-15 ENCOUNTER — Ambulatory Visit (HOSPITAL_BASED_OUTPATIENT_CLINIC_OR_DEPARTMENT_OTHER): Payer: Medicaid Other | Attending: Internal Medicine | Admitting: Internal Medicine

## 2021-12-15 DIAGNOSIS — G4733 Obstructive sleep apnea (adult) (pediatric): Secondary | ICD-10-CM

## 2021-12-27 ENCOUNTER — Other Ambulatory Visit: Payer: Self-pay

## 2021-12-27 ENCOUNTER — Other Ambulatory Visit: Payer: Self-pay | Admitting: Family Medicine

## 2021-12-27 MED ORDER — BASAGLAR KWIKPEN 100 UNIT/ML ~~LOC~~ SOPN
12.0000 [IU] | PEN_INJECTOR | Freq: Every day | SUBCUTANEOUS | 0 refills | Status: DC
  Filled 2021-12-27: qty 3, 25d supply, fill #0

## 2022-01-02 ENCOUNTER — Other Ambulatory Visit: Payer: Self-pay

## 2022-01-03 ENCOUNTER — Other Ambulatory Visit: Payer: Self-pay

## 2022-01-05 ENCOUNTER — Other Ambulatory Visit: Payer: Self-pay

## 2022-01-05 ENCOUNTER — Ambulatory Visit: Payer: 59 | Attending: Nurse Practitioner | Admitting: Nurse Practitioner

## 2022-01-05 ENCOUNTER — Encounter: Payer: Self-pay | Admitting: Nurse Practitioner

## 2022-01-05 VITALS — BP 129/83 | HR 92 | Wt 277.8 lb

## 2022-01-05 DIAGNOSIS — D72829 Elevated white blood cell count, unspecified: Secondary | ICD-10-CM | POA: Diagnosis not present

## 2022-01-05 DIAGNOSIS — M79604 Pain in right leg: Secondary | ICD-10-CM | POA: Diagnosis not present

## 2022-01-05 DIAGNOSIS — E1169 Type 2 diabetes mellitus with other specified complication: Secondary | ICD-10-CM | POA: Diagnosis not present

## 2022-01-05 DIAGNOSIS — M79641 Pain in right hand: Secondary | ICD-10-CM | POA: Insufficient documentation

## 2022-01-05 DIAGNOSIS — E1142 Type 2 diabetes mellitus with diabetic polyneuropathy: Secondary | ICD-10-CM | POA: Insufficient documentation

## 2022-01-05 DIAGNOSIS — Z794 Long term (current) use of insulin: Secondary | ICD-10-CM | POA: Insufficient documentation

## 2022-01-05 DIAGNOSIS — Z86718 Personal history of other venous thrombosis and embolism: Secondary | ICD-10-CM | POA: Insufficient documentation

## 2022-01-05 DIAGNOSIS — E1165 Type 2 diabetes mellitus with hyperglycemia: Secondary | ICD-10-CM | POA: Insufficient documentation

## 2022-01-05 LAB — POCT GLYCOSYLATED HEMOGLOBIN (HGB A1C): HbA1c, POC (controlled diabetic range): 8.7 % — AB (ref 0.0–7.0)

## 2022-01-05 LAB — GLUCOSE, POCT (MANUAL RESULT ENTRY): POC Glucose: 237 mg/dl — AB (ref 70–99)

## 2022-01-05 MED ORDER — BASAGLAR KWIKPEN 100 UNIT/ML ~~LOC~~ SOPN
15.0000 [IU] | PEN_INJECTOR | Freq: Every day | SUBCUTANEOUS | 0 refills | Status: DC
  Filled 2022-01-05 (×2): qty 3, 20d supply, fill #0

## 2022-01-05 MED ORDER — ACETAMINOPHEN-CODEINE #3 300-30 MG PO TABS
1.0000 | ORAL_TABLET | ORAL | 0 refills | Status: DC | PRN
  Filled 2022-01-05 – 2022-01-24 (×2): qty 60, 5d supply, fill #0

## 2022-01-05 NOTE — Progress Notes (Signed)
? ?Assessment & Plan:  ?David Dunlap was seen today for diabetes. ? ?Diagnoses and all orders for this visit: ? ?Type 2 diabetes mellitus with other specified complication, without long-term current use of insulin (HCC) ?-     POCT glucose (manual entry) ?-     POCT glycosylated hemoglobin (Hb A1C) ?-     Insulin Glargine (BASAGLAR KWIKPEN) 100 UNIT/ML; Inject 15 Units into the skin daily. ?-     CMP14+EGFR ? ?Leukocytosis, unspecified type ?-     CBC with Differential ? ?Right leg pain ?-     acetaminophen-codeine (TYLENOL #3) 300-30 MG tablet; Take 1-2 tablets by mouth every 4 (four) hours as needed for moderate pain. ?-     VAS [place] LOWER EXTREMITY VENOUS (DVT); Future ? ?Right hand pain ?-     acetaminophen-codeine (TYLENOL #3) 300-30 MG tablet; Take 1-2 tablets by mouth every 4 (four) hours as needed for moderate pain. ? ? ? ?Patient has been counseled on age-appropriate routine health concerns for screening and prevention. These are reviewed and up-to-date. Referrals have been placed accordingly. Immunizations are up-to-date or declined.    ?Subjective:  ? ?Chief Complaint  ?Patient presents with  ? Diabetes  ? ?HPI ?David Dunlap 51 y.o. male presents to office today for follow up to DM ? ?He has a past medical history of Arthritis, Depression, Diabetes mellitus 2 (05/2016), DVT, Essential hypertension, Gallstones (05/2016), GERD, Sickle cell anemia, and Sleep apnea.  ? ?DM 2 ?Poorly controlled although down from 10.  He has not picked up his basaglar. I am increasing it to 15 units today. ?He continues to be prescribed victoza 1.8 mg daily, glipizide 72m BID and metformin 1000 mg BID. Diabetic complications include peripheral neuropathy for which he takes gabapentin 300 mg TID. LDL not at goal. It does not appear he has picked up his cholesterol medication.  ?Lab Results  ?Component Value Date  ? HGBA1C 8.7 (A) 01/05/2022  ? ?Lab Results  ?Component Value Date  ? LDLCALC 114 (H) 10/06/2021  ?  ? ?B/L Knee  OA ?Chronic B/L knee Pain is worse with weight bearing and prolonged standing. R>L. States after his knee surgery his pain was minimal but never truly resolved. Pain is occurring daily and associated symptoms include swelling in the patellar region He states he has applied for disability several times and been denied. I have prescribed him tylenol #3. If he has a positive drug screen on next random test will discontinue and will not fill again for him. He is aware. He is currently uninsured.  ? ?Notes increased swelling and pain in RLE. 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  ? ? ? ?Review of Systems  ?Constitutional:  Negative for fever, malaise/fatigue and weight loss.  ?HENT: Negative.  Negative for nosebleeds.   ?Eyes: Negative.  Negative for blurred vision, double vision and photophobia.  ?Respiratory: Negative.  Negative for cough and shortness of breath.   ?Cardiovascular: Negative.  Negative for chest pain, palpitations and leg swelling.  ?Gastrointestinal: Negative.  Negative for heartburn, nausea and vomiting.  ?Musculoskeletal:  Positive for joint pain. Negative for myalgias.  ?Neurological: Negative.  Negative for dizziness, focal weakness, seizures and headaches.  ?Psychiatric/Behavioral: Negative.  Negative for suicidal ideas.   ? ?Past Medical History:  ?Diagnosis Date  ? Arthritis   ? Depression   ? Diabetes mellitus without complication ([place] 043/1540 ? NEW ONSET  WITH FAMILY HISTORY  ? DVT (deep venous thrombosis) ([place])   ? Essential hypertension   ? Gallstones 05/2016  ? GERD (gastroesophageal reflux disease)   ? Sickle cell anemia (HCC)   ? Sickle trait  ? Sleep apnea   ? Cpap  ? ? ?Past Surgical History:  ?Procedure Laterality Date  ? CHOLECYSTECTOMY N/A 06/07/2016  ? Procedure: LAPAROSCOPIC CHOLECYSTECTOMY WITH ATTEMPTED INTRAOPERATIVE CHOLANGIOGRAM;  Surgeon: Georganna Skeans, MD;  Location: [facility];  Service: General;  Laterality: N/A;  ? LIGAMENT REPAIR    ? R forearm  ? TOTAL KNEE ARTHROPLASTY Right 04/17/2017  ? Procedure: RIGHT TOTAL KNEE ARTHROPLASTY;  Surgeon: Leandrew Koyanagi, MD;  Location: [facility];  Service: Orthopedics;  Laterality: Right;  ? TOTAL KNEE ARTHROPLASTY Left 04/28/2018  ? Procedure: LEFT TOTAL KNEE ARTHROPLASTY;  Surgeon: Leandrew Koyanagi, MD;  Location: [facility];  Service: Orthopedics;  Laterality: Left;  ? ? ?Family History  ?Problem Relation Age of Onset  ? Diabetes Mother   ? Diabetes Father   ? Hypertension Sister   ? ? ?Social History Reviewed with no changes to be made today.  ? ?Outpatient Medications Prior to Visit  ?Medication Sig Dispense Refill  ? atorvastatin (LIPITOR) 20 MG tablet Take 1 tablet (20 mg total) by mouth daily. FOR CHOLESTEROL 90 tablet 3  ? Blood Glucose Monitoring Suppl (TRUE METRIX METER) DEVI 1 each by Does not apply route 3 (three) times daily before meals. 1 Device 0  ? FLUoxetine (PROZAC) 20 MG tablet Take 2 tablets (40 mg total) by mouth daily. FOR ANXIETY AND DEPRESSION 60 tablet 3  ? gabapentin (NEURONTIN) 300 MG capsule Take 1 capsule (300 mg total) by mouth 3 (three) times daily. FOR NEUROPATHY 90 capsule 5  ? glipiZIDE (GLUCOTROL) 10 MG tablet Take 1 tablet (10 mg total) by mouth 2 (two) times daily before a meal. FOR DIABETES 60 tablet 5  ? glucose blood (TRUE METRIX BLOOD GLUCOSE TEST) test strip Used daily before meals 100 each 12  ? hydrochlorothiazide (HYDRODIURIL) 25 MG tablet Take 1 tablet (25 mg total) by mouth daily. FOR HIGH BLOOD PRESSURE 90 tablet 1  ? hydrOXYzine (ATARAX) 25 MG tablet Take 1 tablet (25 mg total) by mouth every 8 (eight) hours as needed. FOR ANXIETY 60 tablet 1  ? Insulin Pen Needle (TECHLITE PEN NEEDLES) 32G X 4 MM MISC Use to inject Victoza and Lantus. 2 pen needles per day. 100 each 2  ? liraglutide (VICTOZA) 18 MG/3ML SOPN Inject 1.8 mg into the skin daily with breakfast. FOR DIABETES 27 mL 3  ? lisinopril (ZESTRIL) 10 MG  tablet Take 1 tablet (10 mg total) by mouth daily. FOR HIGH BLOOD PRESSURE 90 tablet 1  ? methocarbamol (ROBAXIN) 750 MG tablet Take 1 tablet (750 mg total) by mouth 2 (two) times daily as needed for muscle spasms. 60 tablet 1  ? rivaroxaban (XARELTO) 20 MG TABS tablet Take 1 tablet (20 mg total) by mouth daily with supper. NEEDS PASS 90 tablet 3  ? sildenafil (VIAGRA) 50 MG tablet Take 1 tablet (50 mg total) by mouth daily as needed for erectile dysfunction (at least 24 hours between doses). 10 tablet 1  ? triamcinolone (KENALOG) 0.025 % ointment Apply 1 application topically 2 (two) times daily as needed. SKIN RASH 60 g 0  ? TRUEplus Lancets 28G MISC Use daily before meals 30 each 12  ? Insulin Glargine (BASAGLAR KWIKPEN) 100 UNIT/ML Inject 12 Units into the skin daily. 3 mL  0  ? cetirizine (ZYRTEC) 10 MG tablet Take 1 tablet (10 mg total) by mouth daily. 90 tablet 1  ? ?No facility-administered medications prior to visit.  ? ? ?Allergies  ?Allergen Reactions  ? Metformin And Related Other (See Comments)  ?  GI intolerance  ? ? ?   ?Objective:  ?  ?BP 129/83   Pulse 92   Wt 277 lb 12.8 oz (126 kg)   SpO2 97%   BMI 41.02 kg/m?  ?Wt Readings from Last 3 Encounters:  ?01/05/22 277 lb 12.8 oz (126 kg)  ?10/06/21 284 lb (128.8 kg)  ?09/12/21 265 lb (120.2 kg)  ? ? ?Physical Exam ?Vitals and nursing note reviewed.  ?Constitutional:   ?   Appearance: He is well-developed.  ?HENT:  ?   Head: Normocephalic and atraumatic.  ?Cardiovascular:  ?   Rate and Rhythm: Normal rate and regular rhythm.  ?   Heart sounds: Normal heart sounds. No murmur heard. ?  No friction rub. No gallop.  ?Pulmonary:  ?   Effort: Pulmonary effort is normal. No tachypnea or respiratory distress.  ?   Breath sounds: Normal breath sounds. No decreased breath sounds, wheezing, rhonchi or rales.  ?Chest:  ?   Chest wall: No tenderness.  ?Abdominal:  ?   General: Bowel sounds are normal.  ?   Palpations: Abdomen is soft.  ?Musculoskeletal:     ?    General: Normal range of motion.  ?   Cervical back: Normal range of motion.  ?   Right lower leg: Swelling and tenderness present.  ?   Left lower leg: Normal.  ?Skin: ?   General: Skin is warm and dry.  ?Neurologic

## 2022-01-06 LAB — CMP14+EGFR
ALT: 27 IU/L (ref 0–44)
AST: 11 IU/L (ref 0–40)
Albumin/Globulin Ratio: 1.6 (ref 1.2–2.2)
Albumin: 4.4 g/dL (ref 3.8–4.9)
Alkaline Phosphatase: 74 IU/L (ref 44–121)
BUN/Creatinine Ratio: 11 (ref 9–20)
BUN: 12 mg/dL (ref 6–24)
Bilirubin Total: 0.4 mg/dL (ref 0.0–1.2)
CO2: 24 mmol/L (ref 20–29)
Calcium: 9.3 mg/dL (ref 8.7–10.2)
Chloride: 102 mmol/L (ref 96–106)
Creatinine, Ser: 1.09 mg/dL (ref 0.76–1.27)
Globulin, Total: 2.8 g/dL (ref 1.5–4.5)
Glucose: 198 mg/dL — ABNORMAL HIGH (ref 70–99)
Potassium: 4.7 mmol/L (ref 3.5–5.2)
Sodium: 141 mmol/L (ref 134–144)
Total Protein: 7.2 g/dL (ref 6.0–8.5)
eGFR: 82 mL/min/{1.73_m2} (ref >59)

## 2022-01-06 LAB — CBC WITH DIFFERENTIAL/PLATELET
Basophils Absolute: 0.1 10*3/uL (ref 0.0–0.2)
Basos: 1 %
EOS (ABSOLUTE): 0 10*3/uL (ref 0.0–0.4)
Eos: 0 %
Hematocrit: 45.7 % (ref 37.5–51.0)
Hemoglobin: 15.5 g/dL (ref 13.0–17.7)
Immature Grans (Abs): 0 10*3/uL (ref 0.0–0.1)
Immature Granulocytes: 0 %
Lymphocytes Absolute: 2.6 10*3/uL (ref 0.7–3.1)
Lymphs: 19 %
MCH: 28.3 pg (ref 26.6–33.0)
MCHC: 33.9 g/dL (ref 31.5–35.7)
MCV: 83 fL (ref 79–97)
Monocytes Absolute: 1.1 10*3/uL — ABNORMAL HIGH (ref 0.1–0.9)
Monocytes: 9 %
Neutrophils Absolute: 9.3 10*3/uL — ABNORMAL HIGH (ref 1.4–7.0)
Neutrophils: 71 %
Platelets: 196 10*3/uL (ref 150–450)
RBC: 5.48 x10E6/uL (ref 4.14–5.80)
RDW: 14.5 % (ref 11.6–15.4)
WBC: 13.1 10*3/uL — ABNORMAL HIGH (ref 3.4–10.8)

## 2022-01-08 ENCOUNTER — Telehealth: Payer: Self-pay

## 2022-01-08 NOTE — Telephone Encounter (Signed)
Called pt but phone keeps ringing. To let pt know he has a appt on 01/10/22, 9am at [facility] for his DVT  ?

## 2022-01-10 ENCOUNTER — Ambulatory Visit (HOSPITAL_COMMUNITY): Payer: Self-pay | Attending: Nurse Practitioner

## 2022-01-11 ENCOUNTER — Other Ambulatory Visit: Payer: Self-pay

## 2022-01-24 ENCOUNTER — Other Ambulatory Visit: Payer: Self-pay

## 2022-02-02 ENCOUNTER — Other Ambulatory Visit: Payer: Self-pay

## 2022-02-14 ENCOUNTER — Other Ambulatory Visit: Payer: Self-pay

## 2022-04-04 ENCOUNTER — Other Ambulatory Visit: Payer: Self-pay

## 2022-04-09 ENCOUNTER — Ambulatory Visit: Payer: Medicaid Other | Admitting: Nurse Practitioner

## 2022-05-03 ENCOUNTER — Ambulatory Visit (INDEPENDENT_AMBULATORY_CARE_PROVIDER_SITE_OTHER): Payer: 59

## 2022-05-03 ENCOUNTER — Ambulatory Visit: Payer: Self-pay

## 2022-05-03 ENCOUNTER — Encounter: Payer: Self-pay | Admitting: Physician Assistant

## 2022-05-03 ENCOUNTER — Ambulatory Visit (INDEPENDENT_AMBULATORY_CARE_PROVIDER_SITE_OTHER): Payer: 59 | Admitting: Physician Assistant

## 2022-05-03 ENCOUNTER — Other Ambulatory Visit: Payer: Self-pay

## 2022-05-03 DIAGNOSIS — M25562 Pain in left knee: Secondary | ICD-10-CM | POA: Diagnosis not present

## 2022-05-03 DIAGNOSIS — Z96652 Presence of left artificial knee joint: Secondary | ICD-10-CM

## 2022-05-03 MED ORDER — PREDNISONE 10 MG (21) PO TBPK
ORAL_TABLET | ORAL | 0 refills | Status: DC
  Filled 2022-05-03: qty 21, 6d supply, fill #0

## 2022-05-03 MED ORDER — METHOCARBAMOL 500 MG PO TABS
500.0000 mg | ORAL_TABLET | Freq: Two times a day (BID) | ORAL | 0 refills | Status: DC | PRN
  Filled 2022-05-03: qty 20, 10d supply, fill #0

## 2022-05-03 NOTE — Progress Notes (Signed)
Office Visit Note   Patient: David Dunlap           Date of Birth: 1971/02/28           MRN: [id number] Visit Date: 05/03/2022              Requested by: Claiborne Rigg, NP [address] [place] [place],  [place] [address] PCP: Claiborne Rigg, NP   Assessment & Plan: Visit Diagnoses:  1. H/O total knee replacement, left     Plan: Impression is chronic left knee pain following total knee replacement likely referred from the lumbar spine.  I have discussed starting the patient on a steroid taper and muscle relaxer.  Also like to send him to outpatient physical therapy for his back.  He is agreeable to this plan.  He will follow-up with [place] as needed.  Follow-Up Instructions: Return if symptoms worsen or fail to improve.   Orders:  Orders Placed This Encounter  Procedures   XR KNEE 3 VIEW LEFT   XR KNEE 3 VIEW RIGHT   XR Lumbar Spine 2-3 Views   Meds ordered this encounter  Medications   predniSONE (STERAPRED UNI-PAK 21 TAB) 10 MG (21) TBPK tablet    Sig: Take as directed on package    Dispense:  21 tablet    Refill:  0   methocarbamol (ROBAXIN) 500 MG tablet    Sig: Take 1 tablet (500 mg total) by mouth 2 (two) times daily as needed for muscle spasms.    Dispense:  20 tablet    Refill:  0      Procedures: No procedures performed   Clinical Data: No additional findings.   Subjective: Chief Complaint  Patient presents with   Right Knee - Pain   Left Knee - Pain    HPI patient is a pleasant 51 year old gentleman who comes in today with chronic left knee pain.  He is status post left total knee replacement in August 2019.  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  The pain is primarily to the knee but he does note some radiation to the anterior thigh and lateral hip and into the buttock.  He denies any groin pain.  Symptoms appear to be worse with walking as well as at the end of the  day.  He has used a knee brace which sometimes helps.  He does not take medication for this.  He does note occasional paresthesias to the left lower leg.  Of note, he had his left knee aspirated by Dr. Roda Shutters which was negative for infection.  No previous bone scan.  Currently denies any fevers, chills or any other constitutional symptoms.  Review of Systems as detailed in HPI.  All others reviewed and are negative.   Objective: Vital Signs: There were no vitals taken for this visit.  Physical Exam well-developed well-nourished gentleman in no acute distress.  Alert and oriented x3.  Ortho Exam left knee exam shows painless range of motion from 0 to 100 degrees.  Very minimal medial and lateral joint line tenderness.  He is stable to valgus varus stress.  4 out of 5 strength with resisted straight leg raise.  He does have a positive straight leg raise.  No pain with logroll or FADIR.  Lumbar spine is nontender.  No pain with lumbar flexion or extension.  No focal weakness.  He  is neurovascular tact distally.  Specialty Comments:  No specialty comments available.  Imaging: XR Lumbar Spine 2-3 Views  Result Date: 05/03/2022 Advanced degenerative changes L4-5 and L5-S1  XR KNEE 3 VIEW RIGHT  Result Date: 05/03/2022 Well-seated prosthesis without complication  XR KNEE 3 VIEW LEFT  Result Date: 05/03/2022 Well-seated prosthesis without complication    PMFS History: Patient Active Problem List   Diagnosis Date Noted   Morbid obesity (HCC) 07/22/2018   Body mass index 40.0-44.9, adult (HCC) 07/22/2018   Primary osteoarthritis of left knee    Unilateral primary osteoarthritis, left knee 04/10/2018   Obstructive sleep apnea 09/05/2017   GERD (gastroesophageal reflux disease) 11/27/2016   S/p total knee replacement, bilateral 10/18/2016   Neuropathy 07/25/2016   Acute cholecystitis 06/05/2016   Diabetes mellitus without complication (HCC) 05/18/2016   Cholelithiasis 04/09/2016   Major  depressive disorder, single episode 04/09/2016   Leg DVT (deep venous thromboembolism), chronic (HCC) 12/08/2014   Family history of diabetes mellitus (DM) 12/08/2014   Moderate major depression, single episode (HCC) 08/22/2011   Constipation 11/22/2010   ANKLE EDEMA 10/19/2010   Essential hypertension 10/12/2010   Past Medical History:  Diagnosis Date   Arthritis    Depression    Diabetes mellitus without complication (HCC) 05/2016   NEW ONSET    WITH FAMILY HISTORY   DVT (deep venous thrombosis) (HCC)    Essential hypertension    Gallstones 05/2016   GERD (gastroesophageal reflux disease)    Sickle cell anemia (HCC)    Sickle trait   Sleep apnea    Cpap    Family History  Problem Relation Age of Onset   Diabetes Mother    Diabetes Father    Hypertension Sister     Past Surgical History:  Procedure Laterality Date   CHOLECYSTECTOMY N/A 06/07/2016   Procedure: LAPAROSCOPIC CHOLECYSTECTOMY WITH ATTEMPTED INTRAOPERATIVE CHOLANGIOGRAM;  Surgeon: Violeta Gelinas, MD;  Location[facility];  Service: General;  Laterality: N/A;   LIGAMENT REPAIR     R forearm   TOTAL KNEE ARTHROPLASTY Right 04/17/2017   Procedure: RIGHT TOTAL KNEE ARTHROPLASTY;  Surgeon: Tarry Kos, MD;  Location: [facility];  Service: Orthopedics;  Laterality: Right;   TOTAL KNEE ARTHROPLASTY Left 04/28/2018   Procedure: LEFT TOTAL KNEE ARTHROPLASTY;  Surgeon: Tarry Kos, MD;  Location: [facility];  Service: Orthopedics;  Laterality: Left;   Social History   Occupational History   Not on file  Tobacco Use   Smoking status: Never   Smokeless tobacco: Never  Vaping Use   Vaping Use: Never used  Substance and Sexual Activity   Alcohol use: Yes    Alcohol/week: 1.0 - 2.0 standard drink of alcohol    Types: 1 - 2 Cans of beer per week    Comment: doesn't drink all the drink   Drug use: No    Comment: last time 1989   Sexual activity: Not on file

## 2022-05-09 ENCOUNTER — Encounter: Payer: Self-pay | Admitting: Physician Assistant

## 2022-05-09 ENCOUNTER — Ambulatory Visit: Payer: 59 | Attending: Nurse Practitioner | Admitting: Physician Assistant

## 2022-05-09 ENCOUNTER — Other Ambulatory Visit: Payer: Self-pay

## 2022-05-09 ENCOUNTER — Ambulatory Visit
Admission: RE | Admit: 2022-05-09 | Discharge: 2022-05-09 | Disposition: A | Discharge status: Home / Self Care | Payer: 59 | Source: Ambulatory Visit | Attending: Physician Assistant | Admitting: Physician Assistant

## 2022-05-09 VITALS — BP 136/90 | HR 80 | Ht 69.0 in | Wt 287.9 lb

## 2022-05-09 DIAGNOSIS — F32A Depression, unspecified: Secondary | ICD-10-CM

## 2022-05-09 DIAGNOSIS — F419 Anxiety disorder, unspecified: Secondary | ICD-10-CM

## 2022-05-09 DIAGNOSIS — M79671 Pain in right foot: Secondary | ICD-10-CM

## 2022-05-09 DIAGNOSIS — M25562 Pain in left knee: Secondary | ICD-10-CM | POA: Diagnosis not present

## 2022-05-09 DIAGNOSIS — N529 Male erectile dysfunction, unspecified: Secondary | ICD-10-CM

## 2022-05-09 DIAGNOSIS — E1142 Type 2 diabetes mellitus with diabetic polyneuropathy: Secondary | ICD-10-CM

## 2022-05-09 DIAGNOSIS — Z91199 Patient's noncompliance with other medical treatment and regimen due to unspecified reason: Secondary | ICD-10-CM

## 2022-05-09 DIAGNOSIS — E1169 Type 2 diabetes mellitus with other specified complication: Secondary | ICD-10-CM

## 2022-05-09 DIAGNOSIS — R69 Illness, unspecified: Secondary | ICD-10-CM | POA: Diagnosis not present

## 2022-05-09 DIAGNOSIS — I10 Essential (primary) hypertension: Secondary | ICD-10-CM | POA: Diagnosis not present

## 2022-05-09 DIAGNOSIS — G8929 Other chronic pain: Secondary | ICD-10-CM

## 2022-05-09 DIAGNOSIS — M19071 Primary osteoarthritis, right ankle and foot: Secondary | ICD-10-CM | POA: Diagnosis not present

## 2022-05-09 DIAGNOSIS — Z86718 Personal history of other venous thrombosis and embolism: Secondary | ICD-10-CM | POA: Diagnosis not present

## 2022-05-09 DIAGNOSIS — M25561 Pain in right knee: Secondary | ICD-10-CM

## 2022-05-09 LAB — POCT GLYCOSYLATED HEMOGLOBIN (HGB A1C): HbA1c, POC (controlled diabetic range): 10.3 % — AB (ref 0.0–7.0)

## 2022-05-09 LAB — GLUCOSE, POCT (MANUAL RESULT ENTRY): POC Glucose: 275 mg/dl — AB (ref 70–99)

## 2022-05-09 MED ORDER — SILDENAFIL CITRATE 50 MG PO TABS
50.0000 mg | ORAL_TABLET | Freq: Every day | ORAL | 1 refills | Status: DC | PRN
  Filled 2022-05-09: qty 10, 30d supply, fill #0
  Filled 2023-04-11: qty 10, 30d supply, fill #1

## 2022-05-09 MED ORDER — HYDROCHLOROTHIAZIDE 25 MG PO TABS
25.0000 mg | ORAL_TABLET | Freq: Every day | ORAL | 1 refills | Status: DC
  Filled 2022-05-09: qty 90, 90d supply, fill #0
  Filled 2023-04-11: qty 90, 90d supply, fill #1

## 2022-05-09 MED ORDER — ATORVASTATIN CALCIUM 20 MG PO TABS
20.0000 mg | ORAL_TABLET | Freq: Every day | ORAL | 1 refills | Status: DC
  Filled 2022-05-09: qty 90, 90d supply, fill #0
  Filled 2023-04-11: qty 90, 90d supply, fill #1

## 2022-05-09 MED ORDER — RIVAROXABAN 20 MG PO TABS
20.0000 mg | ORAL_TABLET | Freq: Every day | ORAL | 3 refills | Status: DC
  Filled 2022-05-09: qty 30, 30d supply, fill #0

## 2022-05-09 MED ORDER — LISINOPRIL 10 MG PO TABS
10.0000 mg | ORAL_TABLET | Freq: Every day | ORAL | 1 refills | Status: DC
  Filled 2022-05-09: qty 90, 90d supply, fill #0
  Filled 2023-04-11: qty 90, 90d supply, fill #1

## 2022-05-09 MED ORDER — FLUOXETINE HCL 20 MG PO TABS
40.0000 mg | ORAL_TABLET | Freq: Every day | ORAL | 3 refills | Status: DC
  Filled 2022-05-09: qty 60, 30d supply, fill #0
  Filled 2023-04-11: qty 60, 30d supply, fill #1

## 2022-05-09 MED ORDER — BASAGLAR KWIKPEN 100 UNIT/ML ~~LOC~~ SOPN
20.0000 [IU] | PEN_INJECTOR | Freq: Every day | SUBCUTANEOUS | 0 refills | Status: DC
  Filled 2022-05-09: qty 3, 15d supply, fill #0

## 2022-05-09 MED ORDER — TECHLITE PEN NEEDLES 32G X 4 MM MISC
2 refills | Status: DC
  Filled 2022-05-09: qty 100, 50d supply, fill #0
  Filled 2023-04-11: qty 100, 50d supply, fill #1

## 2022-05-09 MED ORDER — GABAPENTIN 300 MG PO CAPS
300.0000 mg | ORAL_CAPSULE | Freq: Three times a day (TID) | ORAL | 5 refills | Status: DC
  Filled 2022-05-09: qty 90, 30d supply, fill #0
  Filled 2023-04-11: qty 90, 30d supply, fill #1

## 2022-05-09 MED ORDER — HYDROXYZINE HCL 25 MG PO TABS
25.0000 mg | ORAL_TABLET | Freq: Three times a day (TID) | ORAL | 1 refills | Status: DC | PRN
  Filled 2022-05-09: qty 60, 20d supply, fill #0
  Filled 2023-04-11: qty 60, 20d supply, fill #1

## 2022-05-09 MED ORDER — GLIPIZIDE 10 MG PO TABS
10.0000 mg | ORAL_TABLET | Freq: Two times a day (BID) | ORAL | 5 refills | Status: AC
  Filled 2022-05-09: qty 60, 30d supply, fill #0
  Filled 2023-04-11: qty 60, 30d supply, fill #1

## 2022-05-09 MED ORDER — VICTOZA 18 MG/3ML ~~LOC~~ SOPN
1.8000 mg | PEN_INJECTOR | Freq: Every day | SUBCUTANEOUS | 3 refills | Status: DC
  Filled 2022-05-09: qty 27, 90d supply, fill #0

## 2022-05-09 MED ORDER — TRAMADOL HCL 50 MG PO TABS
50.0000 mg | ORAL_TABLET | Freq: Three times a day (TID) | ORAL | 0 refills | Status: AC | PRN
  Filled 2022-05-09: qty 15, 5d supply, fill #0

## 2022-05-09 NOTE — Progress Notes (Signed)
Patient ID: POWER ARNET, male   DOB: 29-Oct-1970, 51 y.o.   MRN: [id number]   David Dunlap, is a 51 y.o. male  [id number]  [id number]  DOB - 08-19-1971  Chief Complaint  Patient presents with   Diabetes   Medication Refill       Subjective:   David Dunlap is a 51 y.o. male here today for diabetes check and med RF.  Also, he has been having foot pain across the top of his foot for about 1 week.  NKI.  Worse after standing a long time.   Not checking blood sugars.  It is very difficult to get direct or consistent answers regarding medications. The only med I can get a consistent answer about is that it seems he DOES take victoza daily.  Then he says he takes all of his meds all of the time except when he is too tired or forgets.  Not compliant with diabetic diet  No problems updated.  ALLERGIES: Allergies  Allergen Reactions   Metformin And Related Other (See Comments)    GI intolerance    PAST MEDICAL HISTORY: Past Medical History:  Diagnosis Date   Arthritis    Depression    Diabetes mellitus without complication ([place]) 0000000   NEW ONSET    WITH FAMILY HISTORY   DVT (deep venous thrombosis) (HCC)    Essential hypertension    Gallstones 05/2016   GERD (gastroesophageal reflux disease)    Sickle cell anemia (HCC)    Sickle trait   Sleep apnea    Cpap    MEDICATIONS AT HOME: Prior to Admission medications   Medication Sig Start Date End Date Taking? Authorizing Provider  Blood Glucose Monitoring Suppl (TRUE METRIX METER) DEVI 1 each by Does not apply route 3 (three) times daily before meals. 07/25/16  Yes Charlott Rakes, MD  glucose blood (TRUE METRIX BLOOD GLUCOSE TEST) test strip Used daily before meals 09/26/21  Yes Gildardo Pounds, NP  methocarbamol (ROBAXIN) 500 MG tablet Take 1 tablet (500 mg total) by mouth 2 (two) times daily as needed for muscle spasms. 05/03/22  Yes Aundra Dubin, PA-C  predniSONE (STERAPRED UNI-PAK 21 TAB) 10 MG (21)  TBPK tablet Take as directed on package 05/03/22  Yes Aundra Dubin, PA-C  traMADol (ULTRAM) 50 MG tablet Take 1 tablet (50 mg total) by mouth every 8 (eight) hours as needed for up to 5 days. 05/09/22 05/14/22 Yes Fahmida Jurich, Dionne Bucy, PA-C  triamcinolone (KENALOG) 0.025 % ointment Apply 1 application topically 2 (two) times daily as needed. SKIN RASH 09/26/21  Yes Gildardo Pounds, NP  TRUEplus Lancets 28G MISC Use daily before meals 09/26/21  Yes Gildardo Pounds, NP  atorvastatin (LIPITOR) 20 MG tablet Take 1 tablet (20 mg total) by mouth daily. FOR CHOLESTEROL 05/09/22 08/07/22  Argentina Donovan, PA-C  cetirizine (ZYRTEC) 10 MG tablet Take 1 tablet (10 mg total) by mouth daily. 01/09/21 04/09/21  Gildardo Pounds, NP  FLUoxetine (PROZAC) 20 MG tablet Take 2 tablets (40 mg total) by mouth daily. FOR ANXIETY AND DEPRESSION 05/09/22   Argentina Donovan, PA-C  gabapentin (NEURONTIN) 300 MG capsule Take 1 capsule (300 mg total) by mouth 3 (three) times daily. FOR NEUROPATHY 05/09/22   Argentina Donovan, PA-C  glipiZIDE (GLUCOTROL) 10 MG tablet Take 1 tablet (10 mg total) by mouth 2 (two) times daily before a meal. FOR DIABETES 05/09/22   Argentina Donovan, PA-C  hydrochlorothiazide (HYDRODIURIL) 25 MG tablet  Take 1 tablet (25 mg total) by mouth daily. FOR HIGH BLOOD PRESSURE 05/09/22 08/07/22  Anders Simmonds, PA-C  hydrOXYzine (ATARAX) 25 MG tablet Take 1 tablet (25 mg total) by mouth every 8 (eight) hours as needed. FOR ANXIETY 05/09/22   Anders Simmonds, PA-C  Insulin Glargine [facility] KWIKPEN) 100 UNIT/ML Inject 20 Units into the skin daily. 05/09/22   Anders Simmonds, PA-C  Insulin Pen Needle (TECHLITE PEN NEEDLES) 32G X 4 MM MISC Use to inject Victoza and Lantus. 2 pen needles per day. 05/09/22   Anders Simmonds, PA-C  liraglutide (VICTOZA) 18 MG/3ML SOPN Inject 1.8 mg into the skin daily with breakfast. FOR DIABETES 05/09/22 08/07/22  Anders Simmonds, PA-C  lisinopril (ZESTRIL) 10 MG tablet Take 1  tablet (10 mg total) by mouth daily. FOR HIGH BLOOD PRESSURE 05/09/22 08/07/22  Anders Simmonds, PA-C  rivaroxaban (XARELTO) 20 MG TABS tablet Take 1 tablet (20 mg total) by mouth daily with supper. NEEDS PASS 05/09/22   Anders Simmonds, PA-C  sildenafil (VIAGRA) 50 MG tablet Take 1 tablet (50 mg total) by mouth daily as needed for erectile dysfunction (at least 24 hours between doses). 05/09/22   Tikesha Mort, Marzella Schlein, PA-C    ROS: Neg HEENT Neg resp Neg cardiac Neg GI Neg GU Neg psych Neg neuro  Objective:   Vitals:   05/09/22 0958  BP: (!) 150/94  Pulse: 80  SpO2: 92%  Weight: 287 lb 14.4 oz (130.6 kg)  Height: 5\' 9"  (1.753 m)   Exam BP recheck 136/90(manual) General appearance : Awake, alert, not in any distress. Speech Clear. Not toxic looking HEENT: Atraumatic and Normocephalic, pupils equally reactive to light and accomodation Neck: Supple, no JVD. No cervical lymphadenopathy.  Chest: Good air entry bilaterally, CTAB.  No rales/rhonchi/wheezing CVS: S1 S2 regular, no murmurs.  Extremities: B/L Lower Ext shows no edema, both legs are warm to touch.  No abnormality or ulceration of R foot.  There is TTP along mid MTP 3,4,5 Neurology: Awake alert, and oriented X 3, CN II-XII intact, Non focal Skin: No Rash  Data Review Lab Results  Component Value Date   HGBA1C 10.3 (A) 05/09/2022   HGBA1C 8.7 (A) 01/05/2022   HGBA1C 10.5 (A) 10/06/2021    Assessment & Plan   1. Type 2 diabetes mellitus with other specified complication, without long-term current use of insulin (HCC) Uncontrolled.  Check blood sugars fasting and bedtime and record and bring to next visit.  Increase basaglar from 15-20.   - Glucose (CBG) - HgB A1c - Insulin Glargine (BASAGLAR KWIKPEN) 100 UNIT/ML; Inject 20 Units into the skin daily.  Dispense: 3 mL; Refill: 0 - atorvastatin (LIPITOR) 20 MG tablet; Take 1 tablet (20 mg total) by mouth daily. FOR CHOLESTEROL  Dispense: 90 tablet; Refill: 1 -  liraglutide (VICTOZA) 18 MG/3ML SOPN; Inject 1.8 mg into the skin daily with breakfast. FOR DIABETES  Dispense: 27 mL; Refill: 3 - glipiZIDE (GLUCOTROL) 10 MG tablet; Take 1 tablet (10 mg total) by mouth 2 (two) times daily before a meal. FOR DIABETES  Dispense: 60 tablet; Refill: 5  2. Right foot pain - DG Foot Complete Right; Future  3. Anxiety and depression - FLUoxetine (PROZAC) 20 MG tablet; Take 2 tablets (40 mg total) by mouth daily. FOR ANXIETY AND DEPRESSION  Dispense: 60 tablet; Refill: 3 - hydrOXYzine (ATARAX) 25 MG tablet; Take 1 tablet (25 mg total) by mouth every 8 (eight) hours as needed. FOR ANXIETY  Dispense: 60 tablet; Refill: 1  4. Erectile dysfunction, unspecified erectile dysfunction type - sildenafil (VIAGRA) 50 MG tablet; Take 1 tablet (50 mg total) by mouth daily as needed for erectile dysfunction (at least 24 hours between doses).  Dispense: 10 tablet; Refill: 1  5. Primary hypertension - lisinopril (ZESTRIL) 10 MG tablet; Take 1 tablet (10 mg total) by mouth daily. FOR HIGH BLOOD PRESSURE  Dispense: 90 tablet; Refill: 1 - hydrochlorothiazide (HYDRODIURIL) 25 MG tablet; Take 1 tablet (25 mg total) by mouth daily. FOR HIGH BLOOD PRESSURE  Dispense: 90 tablet; Refill: 1  6. History of DVT (deep vein thrombosis) - rivaroxaban (XARELTO) 20 MG TABS tablet; Take 1 tablet (20 mg total) by mouth daily with supper. NEEDS PASS  Dispense: 90 tablet; Refill: 3  7. Diabetic polyneuropathy associated with type 2 diabetes mellitus (HCC) - gabapentin (NEURONTIN) 300 MG capsule; Take 1 capsule (300 mg total) by mouth 3 (three) times daily. FOR NEUROPATHY  Dispense: 90 capsule; Refill: 5  8. Chronic pain of both knees - gabapentin (NEURONTIN) 300 MG capsule; Take 1 capsule (300 mg total) by mouth 3 (three) times daily. FOR NEUROPATHY  Dispense: 90 capsule; Refill: 5  9. Noncompliance Compliance imperative.  Labs from 12/2021 reviewed    Return for 3 weeks with Franky Macho for DM; 3  months with PCP.  The patient was given clear instructions to go to ER or return to medical center if symptoms don't improve, worsen or new problems develop. The patient verbalized understanding. The patient was told to call to get lab results if they haven't heard anything in the next week.      Georgian Co, PA-C [facility] and [facility] [place], [place] [phone]   05/09/2022, 10:29 AM

## 2022-05-15 ENCOUNTER — Other Ambulatory Visit: Payer: Self-pay

## 2022-06-15 ENCOUNTER — Other Ambulatory Visit: Payer: Self-pay

## 2022-06-15 ENCOUNTER — Ambulatory Visit: Payer: 59 | Attending: Nurse Practitioner | Admitting: Pharmacist

## 2022-06-15 DIAGNOSIS — E1169 Type 2 diabetes mellitus with other specified complication: Secondary | ICD-10-CM | POA: Diagnosis not present

## 2022-06-15 MED ORDER — SEMAGLUTIDE (1 MG/DOSE) 4 MG/3ML ~~LOC~~ SOPN
1.0000 mg | PEN_INJECTOR | SUBCUTANEOUS | 2 refills | Status: DC
  Filled 2022-06-15 – 2023-04-11 (×2): qty 3, 28d supply, fill #0

## 2022-06-15 MED ORDER — BASAGLAR KWIKPEN 100 UNIT/ML ~~LOC~~ SOPN
10.0000 [IU] | PEN_INJECTOR | Freq: Every day | SUBCUTANEOUS | 1 refills | Status: DC
  Filled 2022-06-15: qty 3, 28d supply, fill #0
  Filled 2023-04-11: qty 3, 28d supply, fill #1

## 2022-06-15 MED ORDER — OZEMPIC (0.25 OR 0.5 MG/DOSE) 2 MG/3ML ~~LOC~~ SOPN
0.5000 mg | PEN_INJECTOR | SUBCUTANEOUS | 0 refills | Status: DC
  Filled 2022-06-15: qty 3, 28d supply, fill #0

## 2022-06-15 NOTE — Progress Notes (Signed)
S:    David Dunlap is a 51 y.o. male who presents for diabetes evaluation, education, and management. PMH is significant for T2DM, HTN. Patient was referred and last seen by [name] on 05/09/2022. At that visit, he admitted to partial compliance. No med changes were made.   Today, pt arrives in good spirits without assistance. No complaints today. He is interested in weekly injections.   Family/Social History:  -Fhx: Diabetes in mother and father -Tobacco: never smoker  -Alcohol: none reported.   Current diabetes medications include:  - Victoza 1.8 mg daily - Lantus 20 units daily (admits that he only taks 12u daily ~4-5 days weekly) - glipizide 10 mg BID  Patient states that he is not taking his medications as prescribed. Patient reports adherence with Victoza and glipizide. But frequently misses Lantus. Of note, he takes the Lantus before bedtime.  Insurance coverage: [employer], [employer]  Patient denies hypoglycemic events.  Reported home fasting blood sugars: Reports it is mostly 90s - 120s. Does not have his meter today.   Patient denies nocturia (nighttime urination).  Patient denies neuropathy (nerve pain). Patient denies visual changes. Patient reports self foot exams.   Patient reported dietary habits: Eats 1-2 meals/day.  - Mostly cooks at home.  - Admits that his recent A1c elevation is mainly due to dietary indiscretion.  - Specifically, he is not limiting starches (names macaroni and cheese). He also admits to increased consumption of sweets.    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  - No formal exercise regimen outside of work.   O:   Lab Results  Component Value Date   HGBA1C 10.3 (A) 05/09/2022   There were no vitals filed for this visit.  Lipid Panel     Component Value Date/Time   CHOL 189 10/06/2021 1439   TRIG 203 (H) 10/06/2021 1439   HDL 39  (L) 10/06/2021 1439   CHOLHDL 4.8 10/06/2021 1439   CHOLHDL 4.6 12/08/2014 0942   VLDL 24 12/08/2014 0942   LDLCALC 114 (H) 10/06/2021 1439   Clinical Atherosclerotic Cardiovascular Disease (ASCVD): No  The 10-year ASCVD risk score (Arnett DK, et al., 2019) is: 21.4%   Values used to calculate the score:     Age: 69 years     Sex: Male     Is Non-Hispanic African American: Yes     Diabetic: Yes     Tobacco smoker: No     Systolic Blood Pressure: 767 mmHg     Is BP treated: Yes     HDL Cholesterol: 39 mg/dL     Total Cholesterol: 189 mg/dL   A/P: Diabetes longstanding currently uncontrolled with most recent A1c. Patient is able to verbalize appropriate hypoglycemia management plan. Medication adherence appears suboptimal. We will stop Victoza and replace with Ozempic for better weight loss and A1c control. Also, will have him start taking Lantus in the morning for adherence reasons.  -Change Lantus to 10 units in the morning.  -Discontinue Victoza.  -Start Ozempic 0.5 mg once a week. Take for 4 weeks, then increase to 1 mg once a week.  -Continue glipizide 10 mg BID. -Extensively discussed pathophysiology of diabetes, recommended lifestyle interventions, dietary effects on blood sugar control.  -Counseled on s/sx of and management of hypoglycemia.  -Next A1c anticipated 07/2022   Verbal patient instructions provided. Patient verbalized understanding of treatment plan. Total time in face  to face counseling 30 minutes.    Follow up pharmD clinic visit in 1 month.  [name], PharmD, [name], CPP Clinical Pharmacist [name] [phone]

## 2022-07-10 ENCOUNTER — Other Ambulatory Visit: Payer: Self-pay

## 2022-07-18 NOTE — Progress Notes (Deleted)
S:     PCP: Geryl Rankins  51 y.o. male who presents for diabetes evaluation, education, and management. PMH is significant for HTN, T2DM, obesity, and DVT.   Patient was referred and last seen by Primary Care Provider, NP [name], on 05/09/22. A1c was up at 10.3 at this time. He reported compliance with his Victoza, but inconsistencies with his other medications. Basaglar dose was increased at this time.   At last visit with CPP on 9/29, patient reported adherence with Victoza and glipizide, but taking only 12 units daily of Lantus 4-5 days a week. He reported BG readings in the 90-120s, but did not bring a meter. Ozempic was initiated in place of Victoza at this time as the patient requested weekly injections. Basaglar dose was also decreased to 10 units in the morning.   Today, patient arrives in *** good spirits and presents without *** any assistance. ***  Patient reports Diabetes was diagnosed in ***.   Family/Social History:  -Fhx: Diabetes in mother and father -Tobacco: never smoker  -Alcohol: none reported.   Current diabetes medications include: glipizide 10mg  BID, Ozempic ***, Basaglar 10 units once daily Current hypertension medications include: HCTZ 25mg  once daily, lisinopril 10mg  once daily Current hyperlipidemia medications include: atorvastatin 20mg  once daily  Patient reports adherence to taking all medications as prescribed.  *** Patient denies adherence with medications, reports missing *** medications *** times per week, on average.  Do you feel that your medications are working for you? {YES NO:22349} Have you been experiencing any side effects to the medications prescribed? {YES NO:22349} Do you have any problems obtaining medications due to transportation or finances? {YES [id number] Insurance coverage: ***  Patient {Actions; denies-reports:120008} hypoglycemic events.  Reported home fasting blood sugars: ***  Reported 2 hour post-meal/random blood  sugars: ***.  Patient {Actions; denies-reports:120008} nocturia (nighttime urination).  Patient {Actions; denies-reports:120008} neuropathy (nerve pain). Patient {Actions; denies-reports:120008} visual changes. Patient {Actions; denies-reports:120008} self foot exams.   Patient reported dietary habits: Eats *** meals/day Breakfast: *** Lunch: *** Dinner: *** Snacks: *** Drinks: ***  Within the past 12 months, did you worry whether your food would run out before you got money to buy more? {YES NO:22349} Within the past 12 months, did the food you bought run out, and you didn't have money to get more? {YES NO:22349} PHQ-9 Score: ***  Patient-reported exercise habits: ***   O:   ROS  Physical Exam  7 day average blood glucose: ***  *** CGM Download:  % Time CGM is active: ***% Average Glucose: *** mg/dL Glucose Management Indicator: ***  Glucose Variability: *** (goal <36%) Time in Goal:  - Time in range 70-180: ***% - Time above range: ***% - Time below range: ***% Observed patterns:   Lab Results  Component Value Date   HGBA1C 10.3 (A) 05/09/2022   There were no vitals filed for this visit.  Lipid Panel     Component Value Date/Time   CHOL 189 10/06/2021 1439   TRIG 203 (H) 10/06/2021 1439   HDL 39 (L) 10/06/2021 1439   CHOLHDL 4.8 10/06/2021 1439   CHOLHDL 4.6 12/08/2014 0942   VLDL 24 12/08/2014 0942   LDLCALC 114 (H) 10/06/2021 1439    Clinical Atherosclerotic Cardiovascular Disease (ASCVD): No  The 10-year ASCVD risk score (Arnett DK, et al., 2019) is: 21.4%   Values used to calculate the score:     Age: 27 years     Sex: Male  Is Non-Hispanic African American: Yes     Diabetic: Yes     Tobacco smoker: No     Systolic Blood Pressure: [id number] mmHg     Is BP treated: Yes     HDL Cholesterol: 39 mg/dL     Total Cholesterol: 189 mg/dL    A/P: Diabetes longstanding *** currently ***. Patient is *** able to verbalize appropriate hypoglycemia  management plan. Medication adherence appears ***. Control is suboptimal due to ***. -{Meds adjust:18428} basal insulin *** (insulin ***). Patient will continue to titrate 1 unit every *** days if fasting blood sugar > 100mg /dl until fasting blood sugars reach goal or next visit.  -{Meds adjust:18428} rapid insulin *** (insulin ***) to ***.  -{Meds adjust:18428} GLP-1 *** (generic ***) to ***.  -{Meds adjust:18428} SGLT2-I *** (generic ***) to ***. Counseled on sick day rules. -{Meds adjust:18428} metformin *** to ***.  -Patient educated on purpose, proper use, and potential adverse effects of ***.  -Extensively discussed pathophysiology of diabetes, recommended lifestyle interventions, dietary effects on blood sugar control.  -Counseled on s/sx of and management of hypoglycemia.  -Next A1c anticipated November.   ASCVD risk - primary ***secondary prevention in patient with diabetes. Last LDL is *** not at goal of <70 *** mg/dL. ASCVD risk factors include *** and 10-year ASCVD risk score of ***. {Desc; low/moderate/high:110033} intensity statin indicated.  -{Meds adjust:18428} ***statin *** mg.   Hypertension longstanding *** currently ***. Blood pressure goal of <130/80 *** mmHg. Medication adherence ***. Blood pressure control is suboptimal due to ***. -***  Written patient instructions provided. Patient verbalized understanding of treatment plan.  Total time in face to face counseling *** minutes.    Follow-up:  Pharmacist ***. PCP clinic visit in November  Maryan Puls, PharmD PGY-1 [facility] Pharmacy Resident

## 2022-07-19 ENCOUNTER — Ambulatory Visit: Payer: 59 | Admitting: Pharmacist

## 2022-08-15 ENCOUNTER — Ambulatory Visit: Payer: 59 | Admitting: Nurse Practitioner

## 2022-10-04 ENCOUNTER — Other Ambulatory Visit (HOSPITAL_COMMUNITY): Payer: Self-pay

## 2022-11-01 ENCOUNTER — Telehealth: Payer: Self-pay

## 2022-11-01 NOTE — Telephone Encounter (Signed)
Patient attempted to be outreached by Junius Finner, PharmD Candidate on 11/01/2022 to discuss hypertension. Unable to leave voicemail as mailbox is full.   [place] [facility] Pharmacy  PharmD Candidate 2024   Maryan Puls, PharmD PGY-1 [facility] Pharmacy Resident

## 2022-11-08 NOTE — Telephone Encounter (Signed)
Patient attempted to be outreached by Junius Finner, PharmD Candidate on 11/08/2022 to discuss hypertension. Unable to leave voicemail as the mailbox is full.   [place] [facility] Pharmacy  PharmD Candidate 2024   Maryan Puls, PharmD PGY-1 [facility] Resident

## 2022-11-12 NOTE — Telephone Encounter (Signed)
Patient attempted to be outreached by Levi Aland, PharmD Candidate on 11/12/22 to discuss hypertension. Unable to leave voicemail as mailbox is full.   Levi Aland, PharmD Candidate, Class of 2024  [place], PharmD PGY-1 [facility] Pharmacy Resident

## 2022-12-03 ENCOUNTER — Other Ambulatory Visit: Payer: Self-pay

## 2022-12-03 ENCOUNTER — Encounter (HOSPITAL_COMMUNITY): Payer: Self-pay | Admitting: Emergency Medicine

## 2022-12-03 ENCOUNTER — Ambulatory Visit (HOSPITAL_COMMUNITY)
Admission: EM | Admit: 2022-12-03 | Discharge: 2022-12-03 | Disposition: A | Discharge status: Home / Self Care | Payer: Medicaid Other | Attending: Emergency Medicine | Admitting: Emergency Medicine

## 2022-12-03 DIAGNOSIS — K30 Functional dyspepsia: Secondary | ICD-10-CM | POA: Diagnosis not present

## 2022-12-03 DIAGNOSIS — R0789 Other chest pain: Secondary | ICD-10-CM | POA: Diagnosis not present

## 2022-12-03 MED ORDER — FAMOTIDINE 20 MG PO TABS
20.0000 mg | ORAL_TABLET | Freq: Every day | ORAL | 2 refills | Status: DC
  Filled 2022-12-03: qty 30, 30d supply, fill #0
  Filled 2023-04-11: qty 30, 30d supply, fill #1

## 2022-12-03 NOTE — Discharge Instructions (Signed)
I recommend to start the pepcid once daily. This may help with your discomfort. You can also try tylenol for any muscle aches  Call the cardiology office to set up appointment for evaluation.

## 2022-12-03 NOTE — ED Provider Notes (Addendum)
[place]    CSN: [id number] Arrival date & time: 12/03/22  1346     History   Chief Complaint Chief Complaint  Patient presents with   Chest Pain    HPI David Dunlap is a 52 y.o. male.  Chest pain started this morning, 8/10 Mid/lower chest. Does not radiate Maybe feels like indigestion No meds taken. No n/v/d. No cardiac hx Started at work where he does some lifting   Past Medical History:  Diagnosis Date   Arthritis    Depression    Diabetes mellitus without complication ([place]) 0000000   NEW ONSET    WITH FAMILY HISTORY   DVT (deep venous thrombosis) (HCC)    Essential hypertension    Gallstones 05/2016   GERD (gastroesophageal reflux disease)    Sickle cell anemia ([place])    Sickle trait   Sleep apnea    Cpap    Patient Active Problem List   Diagnosis Date Noted   Morbid obesity ([place]) 07/22/2018   Body mass index 40.0-44.9, adult ([place]) 07/22/2018   Primary osteoarthritis of left knee    Unilateral primary osteoarthritis, left knee 04/10/2018   Obstructive sleep apnea 09/05/2017   GERD (gastroesophageal reflux disease) 11/27/2016   S/p total knee replacement, bilateral 10/18/2016   Neuropathy 07/25/2016   Acute cholecystitis 06/05/2016   Diabetes mellitus without complication ([place]) [id number]   Cholelithiasis 04/09/2016   Major depressive disorder, single episode 04/09/2016   Leg DVT (deep venous thromboembolism), chronic ([place]) 12/08/2014   Family history of diabetes mellitus (DM) 12/08/2014   Moderate major depression, single episode ([place]) 08/22/2011   Constipation 11/22/2010   ANKLE EDEMA 10/19/2010   Essential hypertension 10/12/2010    Past Surgical History:  Procedure Laterality Date   CHOLECYSTECTOMY N/A 06/07/2016   Procedure: LAPAROSCOPIC CHOLECYSTECTOMY WITH ATTEMPTED INTRAOPERATIVE CHOLANGIOGRAM;  Surgeon: Georganna Skeans, MD;  Location: [place];  Service: General;  Laterality: N/A;   LIGAMENT REPAIR     R forearm   TOTAL KNEE  ARTHROPLASTY Right 04/17/2017   Procedure: RIGHT TOTAL KNEE ARTHROPLASTY;  Surgeon: Leandrew Koyanagi, MD;  Location: [facility];  Service: Orthopedics;  Laterality: Right;   TOTAL KNEE ARTHROPLASTY Left 04/28/2018   Procedure: LEFT TOTAL KNEE ARTHROPLASTY;  Surgeon: Leandrew Koyanagi, MD;  Location: [place];  Service: Orthopedics;  Laterality: Left;    \ Home Medications    Prior to Admission medications   Medication Sig Start Date End Date Taking? Authorizing Provider  famotidine (PEPCID) 20 MG tablet Take 1 tablet (20 mg total) by mouth daily. 12/03/22  Yes Keshaun Dubey, Wells Guiles, PA-C  atorvastatin (LIPITOR) 20 MG tablet Take 1 tablet (20 mg total) by mouth daily. FOR CHOLESTEROL 05/09/22 08/07/22  Argentina Donovan, PA-C  Blood Glucose Monitoring Suppl (TRUE METRIX METER) DEVI 1 each by Does not apply route 3 (three) times daily before meals. 07/25/16   Charlott Rakes, MD  cetirizine (ZYRTEC) 10 MG tablet Take 1 tablet (10 mg total) by mouth daily. 01/09/21 04/09/21  Gildardo Pounds, NP  FLUoxetine (PROZAC) 20 MG tablet Take 2 tablets (40 mg total) by mouth daily. FOR ANXIETY AND DEPRESSION 05/09/22   Argentina Donovan, PA-C  gabapentin (NEURONTIN) 300 MG capsule Take 1 capsule (300 mg total) by mouth 3 (three) times daily. FOR NEUROPATHY 05/09/22   Argentina Donovan, PA-C  glipiZIDE (GLUCOTROL) 10 MG tablet Take 1 tablet (10 mg total) by mouth 2 (two) times daily before a meal. FOR DIABETES 05/09/22   [name]  M, PA-C  glucose blood (TRUE METRIX BLOOD GLUCOSE TEST) test strip Used daily before meals 09/26/21   Gildardo Pounds, NP  hydrochlorothiazide (HYDRODIURIL) 25 MG tablet Take 1 tablet (25 mg total) by mouth daily. FOR HIGH BLOOD PRESSURE 05/09/22 08/07/22  Argentina Donovan, PA-C  hydrOXYzine (ATARAX) 25 MG tablet Take 1 tablet (25 mg total) by mouth every 8 (eight) hours as needed. FOR ANXIETY 05/09/22   Argentina Donovan, PA-C  Insulin Glargine [facility] KWIKPEN) 100 UNIT/ML Inject 10 Units into the skin  daily. 06/15/22   Charlott Rakes, MD  Insulin Pen Needle (TECHLITE PEN NEEDLES) 32G X 4 MM MISC Use to inject Victoza and Lantus. 2 pen needles per day. 05/09/22   Argentina Donovan, PA-C  lisinopril (ZESTRIL) 10 MG tablet Take 1 tablet (10 mg total) by mouth daily. FOR HIGH BLOOD PRESSURE 05/09/22 08/07/22  Argentina Donovan, PA-C  methocarbamol (ROBAXIN) 500 MG tablet Take 1 tablet (500 mg total) by mouth 2 (two) times daily as needed for muscle spasms. 05/03/22   Aundra Dubin, PA-C  predniSONE (STERAPRED UNI-PAK 21 TAB) 10 MG (21) TBPK tablet Take as directed on package 05/03/22   Aundra Dubin, PA-C  rivaroxaban (XARELTO) 20 MG TABS tablet Take 1 tablet (20 mg total) by mouth daily with supper. NEEDS PASS 05/09/22   Argentina Donovan, PA-C  Semaglutide, 1 MG/DOSE, 4 MG/3ML SOPN Inject 1 mg as directed once a week. Start after 4 weeks of the 0.5mg  dose. 06/15/22   Charlott Rakes, MD  Semaglutide,0.25 or 0.5MG /DOS, (OZEMPIC, 0.25 OR 0.5 MG/DOSE,) 2 MG/3ML SOPN Inject 0.5 mg into the skin once a week. For 4 weeks. 06/15/22   Charlott Rakes, MD  sildenafil (VIAGRA) 50 MG tablet Take 1 tablet (50 mg total) by mouth daily as needed for erectile dysfunction (at least 24 hours between doses). 05/09/22   Argentina Donovan, PA-C  triamcinolone (KENALOG) 0.025 % ointment Apply 1 application topically 2 (two) times daily as needed. SKIN RASH 09/26/21   Gildardo Pounds, NP  TRUEplus Lancets 28G MISC Use daily before meals 09/26/21   Gildardo Pounds, NP    Family History Family History  Problem Relation Age of Onset   Diabetes Mother    Diabetes Father    Hypertension Sister     Social History Social History   Tobacco Use   Smoking status: Never   Smokeless tobacco: Never  Vaping Use   Vaping Use: Never used  Substance Use Topics   Alcohol use: Yes    Alcohol/week: 1.0 - 2.0 standard drink of alcohol    Types: 1 - 2 Cans of beer per week    Comment: doesn't drink all the drink   Drug use: No     Comment: last time 1989     Allergies   Metformin and related   Review of Systems Review of Systems As per HPI  Physical Exam Triage Vital Signs ED Triage Vitals  Enc Vitals Group     BP 12/03/22 1352 128/77     Pulse Rate 12/03/22 1352 72     Resp 12/03/22 1352 17     Temp 12/03/22 1352 98 F (36.7 C)     Temp Source 12/03/22 1352 Oral     SpO2 12/03/22 1352 97 %     Weight --      Height --      Head Circumference --      Peak Flow --  Pain Score 12/03/22 1349 8     Pain Loc --      Pain Edu? --      Excl. in [place]? --    No data found.  Updated Vital Signs BP 128/77 (BP Location: Right Arm)   Pulse 72   Temp 98 F (36.7 C) (Oral)   Resp 17   SpO2 97%   Physical Exam Vitals and nursing note reviewed.  Constitutional:      General: He is not in acute distress. HENT:     Mouth/Throat:     Mouth: Mucous membranes are moist.     Pharynx: Oropharynx is clear.  Cardiovascular:     Rate and Rhythm: Normal rate and regular rhythm.     Pulses: Normal pulses.     Heart sounds: Normal heart sounds.  Pulmonary:     Effort: Pulmonary effort is normal.     Breath sounds: Normal breath sounds.  Chest:     Chest wall: Tenderness present.     Comments: Tender over lower sternum, reproducible  Abdominal:     Tenderness: There is no guarding.     Comments: Mildly tender epigastric   Musculoskeletal:     Cervical back: Normal range of motion.  Skin:    General: Skin is warm and dry.     Findings: No rash.  Neurological:     Mental Status: He is alert and oriented to person, place, and time.     UC Treatments / Results  Labs (all labs ordered are listed, but only abnormal results are displayed) Labs Reviewed - No data to display  EKG  Radiology No results found.  Procedures Procedures   Medications Ordered in UC Medications - No data to display  Initial Impression / Assessment and Plan / UC Course  I have reviewed the triage vital signs and  the nursing notes.  Pertinent labs & imaging results that were available during my care of the patient were reviewed by me and considered in my medical decision making (see chart for details).  EKG normal sinus, ventricular rate 78 BPM. No changes compared with prior reading from 2019  Low concern for cardiac etiology at this time. Still recommend he call cards for follow up In the meantime can try tylenol for muscular etiology, pepcid daily for possible reflux Strict ED precautions. Patient agreeable to plan  Final Clinical Impressions(s) / UC Diagnoses   Final diagnoses:  Chest wall discomfort  Indigestion     Discharge Instructions      I recommend to start the pepcid once daily. This may help with your discomfort. You can also try tylenol for any muscle aches  Call the cardiology office to set up appointment for evaluation.      ED Prescriptions     Medication Sig Dispense Auth. Provider   famotidine (PEPCID) 20 MG tablet Take 1 tablet (20 mg total) by mouth daily. 30 tablet Jentry Warnell, Wells Guiles, PA-C      PDMP not reviewed this encounter.   Sarahlynn Cisnero, Vernice Jefferson 12/03/22 1722    Anupama Piehl, Wells Guiles, PA-C 12/03/22 1724

## 2022-12-03 NOTE — ED Triage Notes (Signed)
Pt c/o mid lower chest pains that is intermittent that started this morning. Pt denies eating today only had water. Denies n/v.

## 2022-12-25 ENCOUNTER — Other Ambulatory Visit: Payer: Self-pay

## 2022-12-25 ENCOUNTER — Other Ambulatory Visit (INDEPENDENT_AMBULATORY_CARE_PROVIDER_SITE_OTHER): Payer: Medicaid Other

## 2022-12-25 ENCOUNTER — Ambulatory Visit (INDEPENDENT_AMBULATORY_CARE_PROVIDER_SITE_OTHER): Payer: Medicaid Other | Admitting: Orthopaedic Surgery

## 2022-12-25 DIAGNOSIS — M25571 Pain in right ankle and joints of right foot: Secondary | ICD-10-CM | POA: Diagnosis not present

## 2022-12-25 DIAGNOSIS — M25572 Pain in left ankle and joints of left foot: Secondary | ICD-10-CM | POA: Diagnosis not present

## 2022-12-25 MED ORDER — DICLOFENAC SODIUM 75 MG PO TBEC
75.0000 mg | DELAYED_RELEASE_TABLET | Freq: Two times a day (BID) | ORAL | 2 refills | Status: DC | PRN
  Filled 2022-12-25: qty 60, 30d supply, fill #0

## 2022-12-25 NOTE — Progress Notes (Unsigned)
Office Visit Note   Patient: David Dunlap           Date of Birth: 11/11/1970           MRN: [id number] Visit Date: 12/25/2022              Requested by: Claiborne Rigg, NP [address] [place] [address] [place],  [place] [address] PCP: Claiborne Rigg, NP   Assessment & Plan: Visit Diagnoses:  1. 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  We have discussed various treatment options to include oral NSAIDs for which she would like to proceed.  Will also order an arthritis panel due to the degree of arthritis to multiple joints.  We will call him with the results.  Follow-Up Instructions: Return if symptoms worsen or fail to improve.   Orders:  Orders Placed This Encounter  Procedures   XR Ankle Complete Right   XR Ankle Complete Left   Meds ordered this encounter  Medications   diclofenac (VOLTAREN) 75 MG EC tablet    Sig: Take 1 tablet (75 mg total) by mouth 2 (two) times daily as needed.    Dispense:  60 tablet    Refill:  2      Procedures: No procedures performed   Clinical Data: No additional findings.   Subjective: Chief Complaint  Patient presents with   Left Ankle - Pain   Right Ankle - Pain    HPI patient is a pleasant 53 year old gentleman who comes in today with bilateral ankle pain both equally as bad.  The pain he has is along the posterior tibial tendon, peroneal tendon across the top of his foot.  This is constant but worse when he goes from a seated to standing position as well as when he sits down after being on his feet all day.  He does not take medication for this.  He has a history of advanced arthritis to multiple joints.  He notes that his grandmother has rheumatoid arthritis.  Review of Systems as detailed in HPI.  All others reviewed and are negative.   Objective: Vital Signs: There were no vitals taken for this visit.  Physical Exam well-developed  well-nourished gentleman in no acute distress.  Alert and oriented x 3.  Ortho Exam bilateral ankle exam reveals moderate tenderness along the peroneal and posterior tibial tendons in addition to the dorsum of the midfoot.  Painless range of motion of the ankle.  He is neurovascular intact distally.  Specialty Comments:  No specialty comments available.  Imaging: XR Ankle Complete Left  Result Date: 12/25/2022 Advanced degenerative changes throughout the foot and ankle  XR Ankle Complete Right  Result Date: 12/25/2022 Advanced degenerative changes throughout the foot and ankle    PMFS History: Patient Active Problem List   Diagnosis Date Noted   Morbid obesity 07/22/2018   Body mass index 40.0-44.9, adult 07/22/2018   Primary osteoarthritis of left knee    Unilateral primary osteoarthritis, left knee 04/10/2018   Obstructive sleep apnea 09/05/2017   GERD (gastroesophageal reflux disease) 11/27/2016   S/p total knee replacement, bilateral 10/18/2016   Neuropathy 07/25/2016   Acute cholecystitis 06/05/2016   Diabetes mellitus without complication 05/18/2016   Cholelithiasis 04/09/2016   Major depressive disorder, single episode 04/09/2016   Leg DVT (deep venous thromboembolism), chronic 12/08/2014   Family history of diabetes mellitus (DM) 12/08/2014   Moderate major depression,  single episode 08/22/2011   Constipation 11/22/2010   ANKLE EDEMA 10/19/2010   Essential hypertension 10/12/2010   Past Medical History:  Diagnosis Date   Arthritis    Depression    Diabetes mellitus without complication ([facility]) 05/2016   NEW ONSET    WITH FAMILY HISTORY   DVT (deep venous thrombosis) ([facility])    Essential hypertension    Gallstones 05/2016   GERD (gastroesophageal reflux disease)    Sickle cell anemia ([facility])    Sickle trait   Sleep apnea    Cpap    Family History  Problem Relation Age of Onset   Diabetes Mother    Diabetes Father    Hypertension Sister     Past Surgical  History:  Procedure Laterality Date   CHOLECYSTECTOMY N/A 06/07/2016   Procedure: LAPAROSCOPIC CHOLECYSTECTOMY WITH ATTEMPTED INTRAOPERATIVE CHOLANGIOGRAM;  Surgeon: Violeta Gelinas, MD;  Location[facility];  Service: General;  Laterality: N/A;   LIGAMENT REPAIR     R forearm   TOTAL KNEE ARTHROPLASTY Right 04/17/2017   Procedure: RIGHT TOTAL KNEE ARTHROPLASTY;  Surgeon: Tarry Kos, MD;  [facility];  Service: Orthopedics;  Laterality: Right;   TOTAL KNEE ARTHROPLASTY Left 04/28/2018   Procedure: LEFT TOTAL KNEE ARTHROPLASTY;  Surgeon: Tarry Kos, MD;  [facility];  Service: Orthopedics;  Laterality: Left;   Social History   Occupational History   Not on file  Tobacco Use   Smoking status: Never   Smokeless tobacco: Never  Vaping Use   Vaping Use: Never used  Substance and Sexual Activity   Alcohol use: Yes    Alcohol/week: 1.0 - 2.0 standard drink of alcohol    Types: 1 - 2 Cans of beer per week    Comment: doesn't drink all the drink   Drug use: No    Comment: last time 1989   Sexual activity: Not on file

## 2022-12-26 LAB — ANA: Anti Nuclear Antibody (ANA): NEGATIVE

## 2022-12-26 LAB — RHEUMATOID FACTOR: Rheumatoid fact SerPl-aCnc: <14 IU/mL (ref <14)

## 2022-12-26 LAB — URIC ACID: Uric Acid, Serum: 7.7 mg/dL (ref 4.0–8.0)

## 2022-12-26 LAB — SEDIMENTATION RATE: Sed Rate: 2 mm/h (ref 0–20)

## 2023-02-11 ENCOUNTER — Encounter (HOSPITAL_COMMUNITY): Payer: Self-pay | Admitting: Emergency Medicine

## 2023-02-11 ENCOUNTER — Ambulatory Visit (HOSPITAL_COMMUNITY)
Admission: EM | Admit: 2023-02-11 | Discharge: 2023-02-11 | Disposition: A | Discharge status: Home / Self Care | Payer: 59 | Attending: Family Medicine | Admitting: Family Medicine

## 2023-02-11 ENCOUNTER — Other Ambulatory Visit: Payer: Self-pay

## 2023-02-11 DIAGNOSIS — R079 Chest pain, unspecified: Secondary | ICD-10-CM | POA: Diagnosis not present

## 2023-02-11 MED ORDER — KETOROLAC TROMETHAMINE 30 MG/ML IJ SOLN
INTRAMUSCULAR | Status: AC
  Filled 2023-02-11: qty 1

## 2023-02-11 MED ORDER — KETOROLAC TROMETHAMINE 10 MG PO TABS: 10.0000 mg | ORAL_TABLET | Freq: Four times a day (QID) | ORAL | 0 refills | Status: DC | PRN

## 2023-02-11 MED ORDER — TIZANIDINE HCL 4 MG PO TABS: 4.0000 mg | ORAL_TABLET | Freq: Three times a day (TID) | ORAL | 0 refills | Status: DC | PRN

## 2023-02-11 MED ORDER — KETOROLAC TROMETHAMINE 30 MG/ML IJ SOLN
30.0000 mg | Freq: Once | INTRAMUSCULAR | Status: AC
  Administered 2023-02-11: 30 mg via INTRAMUSCULAR

## 2023-02-11 NOTE — ED Provider Notes (Addendum)
[facility]    CSN: [id number] Arrival date & time: 02/11/23  1657      History   Chief Complaint Chief Complaint  Patient presents with   Flank Pain    Patient c/o right side flank pain since last night. Denies any injury or urinary symptoms.    HPI David Dunlap is a 52 y.o. male.    Flank Pain   Here with pain in his right anterior chest that radiates into his right lateral chest and his mid and posterior axillary line.  When he raises his right arm it makes it hurt a lot worse.  It is only associated with movement.  It is not associated with eating.  No fever or cough or vomiting.  He does take diclofenac as needed.  And has not taken it so far with this pain   He does have diabetes and its pretty well-controlled  Past Medical History:  Diagnosis Date   Arthritis    Depression    Diabetes mellitus without complication ([facility]) 05/2016   NEW ONSET    WITH FAMILY HISTORY   DVT (deep venous thrombosis) ([facility])    Essential hypertension    Gallstones 05/2016   GERD (gastroesophageal reflux disease)    Sickle cell anemia ([facility])    Sickle trait   Sleep apnea    Cpap    Patient Active Problem List   Diagnosis Date Noted   Morbid obesity ([facility]) 07/22/2018   Body mass index 40.0-44.9, adult ([facility]) 07/22/2018   Primary osteoarthritis of left knee    Unilateral primary osteoarthritis, left knee 04/10/2018   Obstructive sleep apnea 09/05/2017   GERD (gastroesophageal reflux disease) 11/27/2016   S/p total knee replacement, bilateral 10/18/2016   Neuropathy 07/25/2016   Acute cholecystitis 06/05/2016   Diabetes mellitus without complication ([facility]) 05/18/2016   Cholelithiasis 04/09/2016   Major depressive disorder, single episode 04/09/2016   Leg DVT (deep venous thromboembolism), chronic ([facility]) 12/08/2014   Family history of diabetes mellitus (DM) 12/08/2014   Moderate major depression, single episode ([facility]) 08/22/2011   Constipation 11/22/2010   ANKLE  EDEMA 10/19/2010   Essential hypertension 10/12/2010    Past Surgical History:  Procedure Laterality Date   CHOLECYSTECTOMY N/A 06/07/2016   Procedure: LAPAROSCOPIC CHOLECYSTECTOMY WITH ATTEMPTED INTRAOPERATIVE CHOLANGIOGRAM;  Surgeon: Violeta Gelinas, MD;  [facility];  Service: General;  Laterality: N/A;   LIGAMENT REPAIR     R forearm   TOTAL KNEE ARTHROPLASTY Right 04/17/2017   Procedure: RIGHT TOTAL KNEE ARTHROPLASTY;  Surgeon: Tarry Kos, MD;  [facility];  Service: Orthopedics;  Laterality: Right;   TOTAL KNEE ARTHROPLASTY Left 04/28/2018   Procedure: LEFT TOTAL KNEE ARTHROPLASTY;  Surgeon: Tarry Kos, MD;  [facility];  Service: Orthopedics;  Laterality: Left;       Home Medications    Prior to Admission medications   Medication Sig Start Date End Date Taking? Authorizing Provider  ketorolac (TORADOL) 10 MG tablet Take 1 tablet (10 mg total) by mouth every 6 (six) hours as needed (pain). 02/11/23  Yes Frankie Zito, Janace Aris, MD  tiZANidine (ZANAFLEX) 4 MG tablet Take 1 tablet (4 mg total) by mouth every 8 (eight) hours as needed for muscle spasms. 02/11/23  Yes Zenia Resides, MD  atorvastatin (LIPITOR) 20 MG tablet Take 1 tablet (20 mg total) by mouth daily. FOR CHOLESTEROL 05/09/22 08/07/22  Anders Simmonds, PA-C  Blood Glucose Monitoring Suppl (TRUE METRIX METER) DEVI 1 each by Does not  apply route 3 (three) times daily before meals. 07/25/16   Hoy Register, MD  cetirizine (ZYRTEC) 10 MG tablet Take 1 tablet (10 mg total) by mouth daily. 01/09/21 04/09/21  Claiborne Rigg, NP  diclofenac (VOLTAREN) 75 MG EC tablet Take 1 tablet (75 mg total) by mouth 2 (two) times daily as needed. 12/25/22   Cristie Hem, PA-C  famotidine (PEPCID) 20 MG tablet Take 1 tablet (20 mg total) by mouth daily. 12/03/22   Rising, Lurena Joiner, PA-C  FLUoxetine (PROZAC) 20 MG tablet Take 2 tablets (40 mg total) by mouth daily. FOR ANXIETY AND DEPRESSION 05/09/22   Anders Simmonds, PA-C   gabapentin (NEURONTIN) 300 MG capsule Take 1 capsule (300 mg total) by mouth 3 (three) times daily. FOR NEUROPATHY 05/09/22   Anders Simmonds, PA-C  glipiZIDE (GLUCOTROL) 10 MG tablet Take 1 tablet (10 mg total) by mouth 2 (two) times daily before a meal. FOR DIABETES 05/09/22   Anders Simmonds, PA-C  glucose blood (TRUE METRIX BLOOD GLUCOSE TEST) test strip Used daily before meals 09/26/21   Claiborne Rigg, NP  hydrochlorothiazide (HYDRODIURIL) 25 MG tablet Take 1 tablet (25 mg total) by mouth daily. FOR HIGH BLOOD PRESSURE 05/09/22 08/07/22  Anders Simmonds, PA-C  hydrOXYzine (ATARAX) 25 MG tablet Take 1 tablet (25 mg total) by mouth every 8 (eight) hours as needed. FOR ANXIETY 05/09/22   Anders Simmonds, PA-C  Insulin Glargine [facility] KWIKPEN) 100 UNIT/ML Inject 10 Units into the skin daily. 06/15/22   Hoy Register, MD  Insulin Pen Needle (TECHLITE PEN NEEDLES) 32G X 4 MM MISC Use to inject Victoza and Lantus. 2 pen needles per day. 05/09/22   Anders Simmonds, PA-C  lisinopril (ZESTRIL) 10 MG tablet Take 1 tablet (10 mg total) by mouth daily. FOR HIGH BLOOD PRESSURE 05/09/22 08/07/22  Anders Simmonds, PA-C  predniSONE (STERAPRED UNI-PAK 21 TAB) 10 MG (21) TBPK tablet Take as directed on package 05/03/22   Cristie Hem, PA-C  Semaglutide, 1 MG/DOSE, 4 MG/3ML SOPN Inject 1 mg as directed once a week. Start after 4 weeks of the 0.5mg  dose. 06/15/22   Hoy Register, MD  Semaglutide,0.25 or 0.5MG /DOS, (OZEMPIC, 0.25 OR 0.5 MG/DOSE,) 2 MG/3ML SOPN Inject 0.5 mg into the skin once a week. For 4 weeks. 06/15/22   Hoy Register, MD  sildenafil (VIAGRA) 50 MG tablet Take 1 tablet (50 mg total) by mouth daily as needed for erectile dysfunction (at least 24 hours between doses). 05/09/22   Anders Simmonds, PA-C  triamcinolone (KENALOG) 0.025 % ointment Apply 1 application topically 2 (two) times daily as needed. SKIN RASH 09/26/21   Claiborne Rigg, NP  TRUEplus Lancets 28G MISC Use daily  before meals 09/26/21   Claiborne Rigg, NP    Family History Family History  Problem Relation Age of Onset   Diabetes Mother    Diabetes Father    Hypertension Sister     Social History Social History   Tobacco Use   Smoking status: Never   Smokeless tobacco: Never  Vaping Use   Vaping Use: Never used  Substance Use Topics   Alcohol use: Yes    Alcohol/week: 1.0 - 2.0 standard drink of alcohol    Types: 1 - 2 Cans of beer per week    Comment: doesn't drink all the drink   Drug use: No    Comment: last time 1989     Allergies   Metformin and related  Review of Systems Review of Systems  Genitourinary:  Positive for flank pain.     Physical Exam Triage Vital Signs ED Triage Vitals  Enc Vitals Group     BP 02/11/23 1727 125/79     Pulse Rate 02/11/23 1727 95     Resp 02/11/23 1727 18     Temp 02/11/23 1727 98.2 F (36.8 C)     Temp Source 02/11/23 1727 Oral     SpO2 02/11/23 1727 99 %     Weight 02/11/23 1728 288 lb 12.8 oz (131 kg)     Height 02/11/23 1728 5\' 9"  (1.753 m)     Head Circumference --      Peak Flow --      Pain Score 02/11/23 1728 10     Pain Loc --      Pain Edu? --      Excl. in [place]? --    No data found.  Updated Vital Signs BP 125/79 (BP Location: Right Arm)   Pulse 95   Temp 98.2 F (36.8 C) (Oral)   Resp 18   Ht 5\' 9"  (1.753 m)   Wt 131 kg   SpO2 99%   BMI 42.65 kg/m   Visual Acuity Right Eye Distance:   Left Eye Distance:   Bilateral Distance:    Right Eye Near:   Left Eye Near:    Bilateral Near:     Physical Exam Vitals reviewed.  Constitutional:      General: He is not in acute distress.    Appearance: He is not ill-appearing, toxic-appearing or diaphoretic.  HENT:     Mouth/Throat:     Mouth: Mucous membranes are moist.  Cardiovascular:     Rate and Rhythm: Normal rate and regular rhythm.     Heart sounds: No murmur heard. Pulmonary:     Effort: Pulmonary effort is normal.     Breath sounds: Normal  breath sounds.  Chest:     Chest wall: Tenderness (He is tender in the right lower ribs from the midclavicular line and to the jugular asked.  There is no rash or induration.  No deformity.) present.      UC Treatments / Results  Labs (all labs ordered are listed, but only abnormal results are displayed) Labs Reviewed - No data to display  EKG   Radiology No results found.  Procedures Procedures (including critical care time)  Medications Ordered in UC Medications  ketorolac (TORADOL) 30 MG/ML injection 30 mg (has no administration in time range)    Initial Impression / Assessment and Plan / UC Course  I have reviewed the triage vital signs and the nursing notes.  Pertinent labs & imaging results that were available during my care of the patient were reviewed by me and considered in my medical decision making (see chart for details).        Toradol injection was given here and Toradol pills are sent in to take in the short-term.  I have asked him not to take the diclofenac while he is taking the Toradol.  Tizanidine is also sent in as a muscle relaxer.  Staff had left Xarelto in his medicine list.  I have reviewed it with the patient and he is no longer taking Xarelto or other blood thinners.  That medication is stopped out of his list  EGFR and creatinine were normal in 2023. Final Clinical Impressions(s) / UC Diagnoses   Final diagnoses:  Right-sided chest pain  Discharge Instructions      You have been given a shot of Toradol 30 mg today.  Ketorolac 10 mg tablets--take 1 tablet every 6 hours as needed for pain.  This is the same medicine that is in the shot we just gave you  While you are taking the ketorolac tablets, do not take the diclofenac/Voltaren tablets.   Take tizanidine 4 mg--1 every 8 hours as needed for muscle spasms; this medication can cause dizziness and sleepiness      ED Prescriptions     Medication Sig Dispense Auth. Provider    ketorolac (TORADOL) 10 MG tablet Take 1 tablet (10 mg total) by mouth every 6 (six) hours as needed (pain). 20 tablet Kayleeann Huxford, Janace Aris, MD   tiZANidine (ZANAFLEX) 4 MG tablet Take 1 tablet (4 mg total) by mouth every 8 (eight) hours as needed for muscle spasms. 15 tablet Annebelle Bostic, Janace Aris, MD      PDMP not reviewed this encounter.   Zenia Resides, MD 02/11/23 Claria Dice    Zenia Resides, MD 02/11/23 [phone]

## 2023-02-11 NOTE — ED Triage Notes (Signed)
Patient c/o right side flank pain since last night. Denies any injury or urinary symptoms.

## 2023-02-11 NOTE — Discharge Instructions (Signed)
You have been given a shot of Toradol 30 mg today.  Ketorolac 10 mg tablets--take 1 tablet every 6 hours as needed for pain.  This is the same medicine that is in the shot we just gave you  While you are taking the ketorolac tablets, do not take the diclofenac/Voltaren tablets.   Take tizanidine 4 mg--1 every 8 hours as needed for muscle spasms; this medication can cause dizziness and sleepiness

## 2023-03-07 ENCOUNTER — Encounter (HOSPITAL_COMMUNITY): Payer: Self-pay

## 2023-03-07 ENCOUNTER — Emergency Department (HOSPITAL_COMMUNITY)
Admission: EM | Admit: 2023-03-07 | Discharge: 2023-03-08 | Disposition: A | Discharge status: Home / Self Care | Payer: 59 | Attending: Emergency Medicine | Admitting: Emergency Medicine

## 2023-03-07 ENCOUNTER — Other Ambulatory Visit: Payer: Self-pay

## 2023-03-07 DIAGNOSIS — Z79899 Other long term (current) drug therapy: Secondary | ICD-10-CM | POA: Diagnosis not present

## 2023-03-07 DIAGNOSIS — Z86718 Personal history of other venous thrombosis and embolism: Secondary | ICD-10-CM | POA: Diagnosis not present

## 2023-03-07 DIAGNOSIS — Z7901 Long term (current) use of anticoagulants: Secondary | ICD-10-CM | POA: Insufficient documentation

## 2023-03-07 DIAGNOSIS — I10 Essential (primary) hypertension: Secondary | ICD-10-CM | POA: Diagnosis not present

## 2023-03-07 DIAGNOSIS — R2240 Localized swelling, mass and lump, unspecified lower limb: Secondary | ICD-10-CM | POA: Diagnosis not present

## 2023-03-07 DIAGNOSIS — M7989 Other specified soft tissue disorders: Secondary | ICD-10-CM | POA: Diagnosis not present

## 2023-03-07 DIAGNOSIS — I82431 Acute embolism and thrombosis of right popliteal vein: Secondary | ICD-10-CM | POA: Insufficient documentation

## 2023-03-07 DIAGNOSIS — I82401 Acute embolism and thrombosis of unspecified deep veins of right lower extremity: Secondary | ICD-10-CM | POA: Diagnosis not present

## 2023-03-07 DIAGNOSIS — Z7984 Long term (current) use of oral hypoglycemic drugs: Secondary | ICD-10-CM | POA: Diagnosis not present

## 2023-03-07 DIAGNOSIS — R2241 Localized swelling, mass and lump, right lower limb: Secondary | ICD-10-CM | POA: Diagnosis not present

## 2023-03-07 DIAGNOSIS — M79661 Pain in right lower leg: Secondary | ICD-10-CM | POA: Diagnosis not present

## 2023-03-07 DIAGNOSIS — Z794 Long term (current) use of insulin: Secondary | ICD-10-CM | POA: Diagnosis not present

## 2023-03-07 DIAGNOSIS — Z7985 Long-term (current) use of injectable non-insulin antidiabetic drugs: Secondary | ICD-10-CM | POA: Insufficient documentation

## 2023-03-07 DIAGNOSIS — X58XXXA Exposure to other specified factors, initial encounter: Secondary | ICD-10-CM | POA: Diagnosis not present

## 2023-03-07 DIAGNOSIS — E119 Type 2 diabetes mellitus without complications: Secondary | ICD-10-CM | POA: Diagnosis not present

## 2023-03-07 DIAGNOSIS — D571 Sickle-cell disease without crisis: Secondary | ICD-10-CM | POA: Diagnosis not present

## 2023-03-07 DIAGNOSIS — R609 Edema, unspecified: Secondary | ICD-10-CM | POA: Diagnosis not present

## 2023-03-07 DIAGNOSIS — S8011XA Contusion of right lower leg, initial encounter: Secondary | ICD-10-CM | POA: Insufficient documentation

## 2023-03-07 LAB — CBC WITH DIFFERENTIAL/PLATELET
Abs Immature Granulocytes: 0.05 10*3/uL (ref 0.00–0.07)
Basophils Absolute: 0 10*3/uL (ref 0.0–0.1)
Basophils Relative: 0 %
Eosinophils Absolute: 0.4 10*3/uL (ref 0.0–0.5)
Eosinophils Relative: 3 %
HCT: 43.8 % (ref 39.0–52.0)
Hemoglobin: 15.6 g/dL (ref 13.0–17.0)
Immature Granulocytes: 0 %
Lymphocytes Relative: 26 %
Lymphs Abs: 3.4 10*3/uL (ref 0.7–4.0)
MCH: 30.2 pg (ref 26.0–34.0)
MCHC: 35.6 g/dL (ref 30.0–36.0)
MCV: 84.9 fL (ref 80.0–100.0)
Monocytes Absolute: 1 10*3/uL (ref 0.1–1.0)
Monocytes Relative: 8 %
Neutro Abs: 8.2 10*3/uL — ABNORMAL HIGH (ref 1.7–7.7)
Neutrophils Relative %: 63 %
Platelets: 177 10*3/uL (ref 150–400)
RBC: 5.16 MIL/uL (ref 4.22–5.81)
RDW: 13.2 % (ref 11.5–15.5)
WBC: 13.1 10*3/uL — ABNORMAL HIGH (ref 4.0–10.5)
nRBC: 0 % (ref 0.0–0.2)

## 2023-03-07 LAB — BASIC METABOLIC PANEL
Anion gap: 15 (ref 5–15)
BUN: 13 mg/dL (ref 6–20)
CO2: 21 mmol/L — ABNORMAL LOW (ref 22–32)
Calcium: 9.1 mg/dL (ref 8.9–10.3)
Chloride: 99 mmol/L (ref 98–111)
Creatinine, Ser: 1.07 mg/dL (ref 0.61–1.24)
GFR, Estimated: >60 mL/min (ref >60)
Glucose, Bld: 170 mg/dL — ABNORMAL HIGH (ref 70–99)
Potassium: 3.7 mmol/L (ref 3.5–5.1)
Sodium: 135 mmol/L (ref 135–145)

## 2023-03-07 LAB — PROTIME-INR
INR: 1 (ref 0.8–1.2)
Prothrombin Time: 13.1 seconds (ref 11.4–15.2)

## 2023-03-07 NOTE — ED Triage Notes (Signed)
Patient BIB [facility] from [facility] with complaint of right leg swelling & pain x 2 days.   Patient reports tender to touch, no blood thinners.   Patient denies injury.  PMHX: DVT  Swelling noted in right leg.

## 2023-03-07 NOTE — ED Provider Triage Note (Signed)
Emergency Medicine Provider Triage Evaluation Note  David Dunlap , a 52 y.o. male  was evaluated in triage.  Pt complains of increased swelling of right leg for 2 or 3 days.  No trauma..  History of DVT off of anticoagulation due to financial  Review of Systems  Positive: Leg swelling Negative: Chest pain shortness of breath  Physical Exam  BP (!) 139/91 (BP Location: Right Arm)   Pulse 78   Temp 98.8 F (37.1 C)   Resp 17   Ht 5\' 9"  (1.753 m)   Wt 122 kg   SpO2 98%   BMI 39.72 kg/m  Gen:   Awake, no distress   Resp:  Normal effort  MSK:   Moves extremities without difficulty  Other:    Medical Decision Making  Medically screening exam initiated at 8:26 PM.  Appropriate orders placed.  David Dunlap was informed that the remainder of the evaluation will be completed by another provider, this initial triage assessment does not replace that evaluation, and the importance of remaining in the ED until their evaluation is complete.     Terrilee Files, MD 03/08/23 1006

## 2023-03-08 ENCOUNTER — Encounter (HOSPITAL_COMMUNITY): Payer: Self-pay

## 2023-03-08 ENCOUNTER — Ambulatory Visit (HOSPITAL_BASED_OUTPATIENT_CLINIC_OR_DEPARTMENT_OTHER)
Admission: RE | Admit: 2023-03-08 | Discharge: 2023-03-08 | Disposition: A | Discharge status: Home / Self Care | Payer: 59 | Source: Ambulatory Visit | Attending: Emergency Medicine | Admitting: Emergency Medicine

## 2023-03-08 ENCOUNTER — Other Ambulatory Visit: Payer: Self-pay

## 2023-03-08 ENCOUNTER — Emergency Department (HOSPITAL_COMMUNITY)
Admission: EM | Admit: 2023-03-08 | Discharge: 2023-03-08 | Disposition: A | Discharge status: Home / Self Care | Payer: 59 | Source: Home / Self Care | Attending: Emergency Medicine | Admitting: Emergency Medicine

## 2023-03-08 ENCOUNTER — Other Ambulatory Visit (HOSPITAL_COMMUNITY): Payer: Self-pay

## 2023-03-08 DIAGNOSIS — E119 Type 2 diabetes mellitus without complications: Secondary | ICD-10-CM | POA: Insufficient documentation

## 2023-03-08 DIAGNOSIS — Z794 Long term (current) use of insulin: Secondary | ICD-10-CM | POA: Insufficient documentation

## 2023-03-08 DIAGNOSIS — I82401 Acute embolism and thrombosis of unspecified deep veins of right lower extremity: Secondary | ICD-10-CM | POA: Diagnosis not present

## 2023-03-08 DIAGNOSIS — Z79899 Other long term (current) drug therapy: Secondary | ICD-10-CM | POA: Insufficient documentation

## 2023-03-08 DIAGNOSIS — Z7984 Long term (current) use of oral hypoglycemic drugs: Secondary | ICD-10-CM | POA: Insufficient documentation

## 2023-03-08 DIAGNOSIS — I82431 Acute embolism and thrombosis of right popliteal vein: Secondary | ICD-10-CM | POA: Insufficient documentation

## 2023-03-08 DIAGNOSIS — I82562 Chronic embolism and thrombosis of left calf muscular vein: Secondary | ICD-10-CM | POA: Insufficient documentation

## 2023-03-08 DIAGNOSIS — M7989 Other specified soft tissue disorders: Secondary | ICD-10-CM | POA: Insufficient documentation

## 2023-03-08 DIAGNOSIS — Z7901 Long term (current) use of anticoagulants: Secondary | ICD-10-CM | POA: Insufficient documentation

## 2023-03-08 DIAGNOSIS — I10 Essential (primary) hypertension: Secondary | ICD-10-CM | POA: Insufficient documentation

## 2023-03-08 MED ORDER — APIXABAN (ELIQUIS) VTE STARTER PACK (10MG AND 5MG): ORAL_TABLET | ORAL | 0 refills | Status: DC

## 2023-03-08 MED ORDER — ACETAMINOPHEN 500 MG PO TABS
1000.0000 mg | ORAL_TABLET | Freq: Once | ORAL | Status: AC
  Administered 2023-03-08: 1000 mg via ORAL
  Filled 2023-03-08: qty 2

## 2023-03-08 MED ORDER — APIXABAN (ELIQUIS) VTE STARTER PACK (10MG AND 5MG)
ORAL_TABLET | ORAL | 0 refills | Status: DC
Start: 2023-03-08 — End: 2024-06-09
  Filled 2023-03-08: qty 74, 30d supply, fill #0

## 2023-03-08 MED ORDER — APIXABAN (ELIQUIS) VTE STARTER PACK (10MG AND 5MG)
ORAL_TABLET | ORAL | 0 refills | Status: DC
  Filled 2023-03-08: qty 74, 30d supply, fill #0

## 2023-03-08 MED ORDER — APIXABAN (ELIQUIS) EDUCATION KIT FOR DVT/PE PATIENTS
PACK | Freq: Once | Status: AC
  Filled 2023-03-08: qty 1

## 2023-03-08 MED ORDER — APIXABAN 5 MG PO TABS
10.0000 mg | ORAL_TABLET | Freq: Once | ORAL | Status: AC
  Administered 2023-03-08: 10 mg via ORAL
  Filled 2023-03-08: qty 2

## 2023-03-08 MED ORDER — APIXABAN 5 MG PO TABS
10.0000 mg | ORAL_TABLET | Freq: Two times a day (BID) | ORAL | Status: DC
  Administered 2023-03-08: 10 mg via ORAL
  Filled 2023-03-08: qty 2

## 2023-03-08 MED ORDER — APIXABAN (ELIQUIS) VTE STARTER PACK (10MG AND 5MG)
ORAL_TABLET | ORAL | 0 refills | Status: DC
Start: 2023-03-08 — End: 2023-03-08

## 2023-03-08 NOTE — Progress Notes (Signed)
Right lower extremity venous duplex has been completed. Preliminary results can be found in CV Proc through chart review.   03/08/23 10:32 AM Olen Cordial RVT

## 2023-03-08 NOTE — ED Triage Notes (Signed)
Pt arrived POV. Had [place] on R leg and dx w/ DVT today at [facility]. Pt was instructed to come to ED. [facility] gave pt 1 time dose of Eliquis yesterday.

## 2023-03-08 NOTE — ED Notes (Addendum)
Patient verbalizes understanding of discharge instructions. Opportunity for questioning and answers were provided. Waiting on meds to be delivered from [facility].

## 2023-03-08 NOTE — Discharge Instructions (Addendum)
You were seen in the emergency department today after an ultrasound of your right leg.  This was consistent with a deep venous thrombosis.  We are starting you on the rest of the course of the blood thinner.  Please follow-up with your primary doctor regarding this medication change, to discuss if you should be on long-term anticoagulants.  Continue to monitor how you are doing and return to the ER for any new or worsening symptoms such as chest pain or shortness of breath.  I have sent a referral to the hematology/oncologists to follow up with to figure out why you continue to get blood clots.

## 2023-03-08 NOTE — ED Provider Notes (Signed)
[place] EMERGENCY DEPARTMENT AT [facility] Provider Note   CSN: [id number] Arrival date & time: 03/08/23  1030     History  Chief Complaint  Patient presents with   DVT in Right Leg    David Dunlap is a 52 y.o. male with history of bilateral DVTs, diabetes, depression, hypertension, GERD, sleep apnea who presents emergency department after follow-up ultrasound.  Patient was seen in the ER last night for right calf swelling.  He was given a one-time dose of Eliquis and was instructed to return today for a vascular ultrasound.  He was made aware that his ultrasound was positive for DVT in the right leg.  He denies any chest pain or shortness of breath.  HPI     Home Medications Prior to Admission medications   Medication Sig Start Date End Date Taking? Authorizing Provider  APIXABAN (ELIQUIS) VTE STARTER PACK (10MG  AND 5MG ) Take as directed on package: start with two-5mg  tablets twice daily for 7 days. On day 8, switch to one-5mg  tablet twice daily. 03/08/23  Yes Jomaira Darr T, PA-C  atorvastatin (LIPITOR) 20 MG tablet Take 1 tablet (20 mg total) by mouth daily. FOR CHOLESTEROL 05/09/22 08/07/22  Anders Simmonds, PA-C  Blood Glucose Monitoring Suppl (TRUE METRIX METER) DEVI 1 each by Does not apply route 3 (three) times daily before meals. 07/25/16   Hoy Register, MD  cetirizine (ZYRTEC) 10 MG tablet Take 1 tablet (10 mg total) by mouth daily. 01/09/21 04/09/21  Claiborne Rigg, NP  famotidine (PEPCID) 20 MG tablet Take 1 tablet (20 mg total) by mouth daily. 12/03/22   Rising, Lurena Joiner, PA-C  FLUoxetine (PROZAC) 20 MG tablet Take 2 tablets (40 mg total) by mouth daily. FOR ANXIETY AND DEPRESSION 05/09/22   Anders Simmonds, PA-C  gabapentin (NEURONTIN) 300 MG capsule Take 1 capsule (300 mg total) by mouth 3 (three) times daily. FOR NEUROPATHY 05/09/22   Anders Simmonds, PA-C  glipiZIDE (GLUCOTROL) 10 MG tablet Take 1 tablet (10 mg total) by mouth 2 (two) times  daily before a meal. FOR DIABETES 05/09/22   Anders Simmonds, PA-C  glucose blood (TRUE METRIX BLOOD GLUCOSE TEST) test strip Used daily before meals 09/26/21   Claiborne Rigg, NP  hydrochlorothiazide (HYDRODIURIL) 25 MG tablet Take 1 tablet (25 mg total) by mouth daily. FOR HIGH BLOOD PRESSURE 05/09/22 08/07/22  Anders Simmonds, PA-C  hydrOXYzine (ATARAX) 25 MG tablet Take 1 tablet (25 mg total) by mouth every 8 (eight) hours as needed. FOR ANXIETY 05/09/22   Anders Simmonds, PA-C  Insulin Glargine [facility] KWIKPEN) 100 UNIT/ML Inject 10 Units into the skin daily. 06/15/22   Hoy Register, MD  Insulin Pen Needle (TECHLITE PEN NEEDLES) 32G X 4 MM MISC Use to inject Victoza and Lantus. 2 pen needles per day. 05/09/22   Anders Simmonds, PA-C  lisinopril (ZESTRIL) 10 MG tablet Take 1 tablet (10 mg total) by mouth daily. FOR HIGH BLOOD PRESSURE 05/09/22 08/07/22  Anders Simmonds, PA-C  predniSONE (STERAPRED UNI-PAK 21 TAB) 10 MG (21) TBPK tablet Take as directed on package 05/03/22   Cristie Hem, PA-C  Semaglutide, 1 MG/DOSE, 4 MG/3ML SOPN Inject 1 mg as directed once a week. Start after 4 weeks of the 0.5mg  dose. 06/15/22   Hoy Register, MD  Semaglutide,0.25 or 0.5MG /DOS, (OZEMPIC, 0.25 OR 0.5 MG/DOSE,) 2 MG/3ML SOPN Inject 0.5 mg into the skin once a week. For 4 weeks. 06/15/22   Newlin, Odette Horns,  MD  sildenafil (VIAGRA) 50 MG tablet Take 1 tablet (50 mg total) by mouth daily as needed for erectile dysfunction (at least 24 hours between doses). 05/09/22   Anders Simmonds, PA-C  tiZANidine (ZANAFLEX) 4 MG tablet Take 1 tablet (4 mg total) by mouth every 8 (eight) hours as needed for muscle spasms. 02/11/23   Zenia Resides, MD  triamcinolone (KENALOG) 0.025 % ointment Apply 1 application topically 2 (two) times daily as needed. SKIN RASH 09/26/21   Claiborne Rigg, NP  TRUEplus Lancets 28G MISC Use daily before meals 09/26/21   Claiborne Rigg, NP      Allergies    Metformin and related     Review of Systems   Review of Systems  Cardiovascular:  Positive for leg swelling.  All other systems reviewed and are negative.   Physical Exam Updated Vital Signs BP 129/72   Pulse 80   Temp 98 F (36.7 C) (Oral)   Resp 18   Ht 5\' 9"  (1.753 m)   Wt 122 kg   SpO2 100%   BMI 39.72 kg/m  Physical Exam Vitals and nursing note reviewed.  Constitutional:      Appearance: Normal appearance.  HENT:     Head: Normocephalic and atraumatic.  Eyes:     Conjunctiva/sclera: Conjunctivae normal.  Cardiovascular:     Pulses:          Posterior tibial pulses are 2+ on the right side and 2+ on the left side.     Comments: Nonpitting edema to the right calf without tenderness Pulmonary:     Effort: Pulmonary effort is normal. No respiratory distress.  Musculoskeletal:     Comments: Compartments of the lower legs are soft  Skin:    General: Skin is warm and dry.  Neurological:     Mental Status: He is alert.  Psychiatric:        Mood and Affect: Mood normal.        Behavior: Behavior normal.     ED Results / Procedures / Treatments   Labs (all labs ordered are listed, but only abnormal results are displayed) Labs Reviewed - No data to display  EKG None  Radiology LE Venous  Result Date: 03/08/2023  Lower Venous DVT Study Patient Name:  David Dunlap  Date of Exam:   03/08/2023 Medical Rec #: [id number]           Accession #:    [id number] Date of Birth: February 23, 1971           Patient Gender: M Patient Age:   60 years Exam Location:  [facility] Procedure:      VAS [place] LOWER EXTREMITY VENOUS (DVT) Referring Phys: Riki Sheer --------------------------------------------------------------------------------  Indications: Swelling.  Risk Factors: None identified. Limitations: Poor ultrasound/tissue interface. Comparison Study: 11/25/2018 - Right: Findings consistent with acute deep vein                   thrombosis involving the                   right popliteal vein.  Findings consistent with age                   indeterminate deep vein                   thrombosis involving the right gastrocnemius. No cystic  structure found in                   the popliteal fossa.                   Left: Findings consistent with age indeterminate deep vein                   thrombosis                   involving the left gastrocnemius vein. No cystic structure                   found in the                   popliteal fossa. Performing Technologist: Chanda Busing RVT  Examination Guidelines: A complete evaluation includes B-mode imaging, spectral Doppler, color Doppler, and power Doppler as needed of all accessible portions of each vessel. Bilateral testing is considered an integral part of a complete examination. Limited examinations for reoccurring indications may be performed as noted. The reflux portion of the exam is performed with the patient in reverse Trendelenburg.  +---------+---------------+---------+-----------+----------+-----------------+ RIGHT    CompressibilityPhasicitySpontaneityPropertiesThrombus Aging    +---------+---------------+---------+-----------+----------+-----------------+ CFV      Full           Yes      Yes                                    +---------+---------------+---------+-----------+----------+-----------------+ SFJ      Full                                                           +---------+---------------+---------+-----------+----------+-----------------+ FV Prox  Full                                                           +---------+---------------+---------+-----------+----------+-----------------+ FV Mid   Full                                                           +---------+---------------+---------+-----------+----------+-----------------+ FV DistalFull                                                            +---------+---------------+---------+-----------+----------+-----------------+ PFV      Full                                                           +---------+---------------+---------+-----------+----------+-----------------+ POP  Partial        Yes      Yes                  Age Indeterminate +---------+---------------+---------+-----------+----------+-----------------+ PTV      Full                                                           +---------+---------------+---------+-----------+----------+-----------------+ PERO     Full                                                           +---------+---------------+---------+-----------+----------+-----------------+   +----+---------------+---------+-----------+----------+--------------+ LEFTCompressibilityPhasicitySpontaneityPropertiesThrombus Aging +----+---------------+---------+-----------+----------+--------------+ CFV Full           Yes      Yes                                 +----+---------------+---------+-----------+----------+--------------+    Summary: RIGHT: - Findings consistent with age indeterminate deep vein thrombosis involving the right popliteal vein. - No cystic structure found in the popliteal fossa.  LEFT: - No evidence of common femoral vein obstruction.  *See table(s) above for measurements and observations.    Preliminary     Procedures Procedures    Medications Ordered in ED Medications  apixaban (ELIQUIS) tablet 10 mg (has no administration in time range)  apixaban (ELIQUIS) Education Kit for DVT/PE patients (has no administration in time range)    ED Course/ Medical Decision Making/ A&P                             Medical Decision Making Risk Prescription drug management.   This patient is a 52 y.o. male  who presents to the ED for concern of positive DVT on ultrasound.   Past Medical History / Co-morbidities / Social History: bilateral DVTs, diabetes, depression,  hypertension, GERD, sleep apnea  Additional history: Chart reviewed. Pertinent results include: Reviewed PCP visit in 2020 in which patient was following up for recurrent bilateral DVTs.  Was started on rivaroxaban at that time.  Physical Exam: Physical exam performed. The pertinent findings include: Normal vital signs, no acute distress.  Not complaining of any chest pain or shortness of breath.  Normal respiratory effort, and normal oxygen saturation on room air.  There is nonpitting edema to the right calf without tenderness.  Normal range of motion, compartments are soft.  Strong distal pulses.  Lab Tests/Imaging studies: I personally interpreted labs/imaging and the pertinent results include: Right lower extremity vascular ultrasound shows age-indeterminate deep vein thrombosis involving the right popliteal vein.  Medications: I ordered medication including Eliquis starter pack.  I have reviewed the patients home medicines and have made adjustments as needed.   Disposition: After consideration of the diagnostic results and the patients response to treatment, I feel that emergency department workup does not suggest an emergent condition requiring admission or immediate intervention beyond what has been performed at this time. The plan is: discharge to home with treatment of acute DVT. Patient with history of same. Strongly encouraged  following up with PCP and likely would benefit from follow up with vascular due to recurrence. Low concern for PE as patient has no CP, SOB, and has very reassuring and stable vital signs. The patient is safe for discharge and has been instructed to return immediately for worsening symptoms, change in symptoms or any other concerns.  Final Clinical Impression(s) / ED Diagnoses Final diagnoses:  Acute deep vein thrombosis (DVT) of popliteal vein of right lower extremity (HCC)    Rx / DC Orders ED Discharge Orders          Ordered    APIXABAN (ELIQUIS) VTE  STARTER PACK (10MG  AND 5MG )       Note to Pharmacy: If starter pack unavailable, substitute with seventy-four 5 mg apixaban tabs following the above SIG directions.   03/08/23 1424           Portions of this report may have been transcribed using voice recognition software. Every effort was made to ensure accuracy; however, inadvertent computerized transcription errors may be present.    Jeanella Flattery 03/08/23 1437    Terald Sleeper, MD 03/08/23 [phone]

## 2023-03-08 NOTE — ED Provider Notes (Signed)
[place] EMERGENCY DEPARTMENT AT [facility] Provider Note   CSN: [id number] Arrival date & time: 03/07/23  2017     History  Chief Complaint  Patient presents with   Leg Swelling   HPI David Dunlap is a 52 y.o. male with history of DVTs, hypertension, diabetes and sickle cell anemia presenting for leg swelling.  Started 2 days ago in the right leg.  Also with pain that is primarily about the mid shin and radiates upward towards the knee and down just above the ankle.  Denies bilateral calf tenderness.  States she has had DVTs in both legs before most recent was 2 years ago.  Not currently on blood thinner.  Denies shortness of breath or chest pain.  HPI     Home Medications Prior to Admission medications   Medication Sig Start Date End Date Taking? Authorizing Provider  atorvastatin (LIPITOR) 20 MG tablet Take 1 tablet (20 mg total) by mouth daily. FOR CHOLESTEROL 05/09/22 08/07/22  Anders Simmonds, PA-C  Blood Glucose Monitoring Suppl (TRUE METRIX METER) DEVI 1 each by Does not apply route 3 (three) times daily before meals. 07/25/16   Hoy Register, MD  cetirizine (ZYRTEC) 10 MG tablet Take 1 tablet (10 mg total) by mouth daily. 01/09/21 04/09/21  Claiborne Rigg, NP  diclofenac (VOLTAREN) 75 MG EC tablet Take 1 tablet (75 mg total) by mouth 2 (two) times daily as needed. 12/25/22   Cristie Hem, PA-C  famotidine (PEPCID) 20 MG tablet Take 1 tablet (20 mg total) by mouth daily. 12/03/22   Rising, Lurena Joiner, PA-C  FLUoxetine (PROZAC) 20 MG tablet Take 2 tablets (40 mg total) by mouth daily. FOR ANXIETY AND DEPRESSION 05/09/22   Anders Simmonds, PA-C  gabapentin (NEURONTIN) 300 MG capsule Take 1 capsule (300 mg total) by mouth 3 (three) times daily. FOR NEUROPATHY 05/09/22   Anders Simmonds, PA-C  glipiZIDE (GLUCOTROL) 10 MG tablet Take 1 tablet (10 mg total) by mouth 2 (two) times daily before a meal. FOR DIABETES 05/09/22   Anders Simmonds, PA-C  glucose blood  (TRUE METRIX BLOOD GLUCOSE TEST) test strip Used daily before meals 09/26/21   Claiborne Rigg, NP  hydrochlorothiazide (HYDRODIURIL) 25 MG tablet Take 1 tablet (25 mg total) by mouth daily. FOR HIGH BLOOD PRESSURE 05/09/22 08/07/22  Anders Simmonds, PA-C  hydrOXYzine (ATARAX) 25 MG tablet Take 1 tablet (25 mg total) by mouth every 8 (eight) hours as needed. FOR ANXIETY 05/09/22   Anders Simmonds, PA-C  Insulin Glargine [facility] KWIKPEN) 100 UNIT/ML Inject 10 Units into the skin daily. 06/15/22   Hoy Register, MD  Insulin Pen Needle (TECHLITE PEN NEEDLES) 32G X 4 MM MISC Use to inject Victoza and Lantus. 2 pen needles per day. 05/09/22   Anders Simmonds, PA-C  ketorolac (TORADOL) 10 MG tablet Take 1 tablet (10 mg total) by mouth every 6 (six) hours as needed (pain). 02/11/23   Zenia Resides, MD  lisinopril (ZESTRIL) 10 MG tablet Take 1 tablet (10 mg total) by mouth daily. FOR HIGH BLOOD PRESSURE 05/09/22 08/07/22  Anders Simmonds, PA-C  predniSONE (STERAPRED UNI-PAK 21 TAB) 10 MG (21) TBPK tablet Take as directed on package 05/03/22   Cristie Hem, PA-C  Semaglutide, 1 MG/DOSE, 4 MG/3ML SOPN Inject 1 mg as directed once a week. Start after 4 weeks of the 0.5mg  dose. 06/15/22   Hoy Register, MD  Semaglutide,0.25 or 0.5MG /DOS, (OZEMPIC, 0.25 OR 0.5 MG/DOSE,)  2 MG/3ML SOPN Inject 0.5 mg into the skin once a week. For 4 weeks. 06/15/22   Hoy Register, MD  sildenafil (VIAGRA) 50 MG tablet Take 1 tablet (50 mg total) by mouth daily as needed for erectile dysfunction (at least 24 hours between doses). 05/09/22   Anders Simmonds, PA-C  tiZANidine (ZANAFLEX) 4 MG tablet Take 1 tablet (4 mg total) by mouth every 8 (eight) hours as needed for muscle spasms. 02/11/23   Zenia Resides, MD  triamcinolone (KENALOG) 0.025 % ointment Apply 1 application topically 2 (two) times daily as needed. SKIN RASH 09/26/21   Claiborne Rigg, NP  TRUEplus Lancets 28G MISC Use daily before meals 09/26/21    Claiborne Rigg, NP      Allergies    Metformin and related    Review of Systems   See HPI for pertinent positives   Physical Exam   Vitals:   03/08/23 0012 03/08/23 0015  BP: (!) 140/89 (!) 131/91  Pulse: 68 78  Resp: 15   Temp: 98.2 F (36.8 C)   SpO2: 100% 100%    CONSTITUTIONAL:  well-appearing, NAD NEURO:  Alert and oriented x 3, CN 3-12 grossly intact EYES:  eyes equal and reactive ENT/NECK:  Supple, no stridor CARDIO: Regular rate and rhythm, appears well-perfused  PULM:  No respiratory distress, CTAB GI/GU:  non-distended MSK/SPINE:  No gross deformities, edema noted in both lower extremities right greater than left, ecchymosis noted about the mid shin to the right leg.  Both legs are symmetrically warm but not hot.  Mid shin is tender to touch.  Pedal pulses are 2+ bilaterally.  No calf tenderness. SKIN:  no rash, atraumatic   *Additional and/or pertinent findings included in MDM below    ED Results / Procedures / Treatments   Labs (all labs ordered are listed, but only abnormal results are displayed) Labs Reviewed  BASIC METABOLIC PANEL - Abnormal; Notable for the following components:      Result Value   CO2 21 (*)    Glucose, Bld 170 (*)    All other components within normal limits  CBC WITH DIFFERENTIAL/PLATELET - Abnormal; Notable for the following components:   WBC 13.1 (*)    Neutro Abs 8.2 (*)    All other components within normal limits  PROTIME-INR    EKG None  Radiology No results found.  Procedures Procedures    Medications Ordered in ED Medications  apixaban (ELIQUIS) tablet 10 mg (has no administration in time range)  acetaminophen (TYLENOL) tablet 1,000 mg (has no administration in time range)    ED Course/ Medical Decision Making/ A&P                             Medical Decision Making  52 year old well-appearing male presenting for leg pain and swelling.  Exam notable for swelling in the right leg, with ecchymosis  overlying the mid shin but otherwise neurovascularly intact. DDx includes DVT, PE, PAD, venous stasis, trauma, and cellulitis. Given his history, symptoms and physical exam findings concerning for possible DVT.  PE unlikely given no chest pain or shortness of breath and not tachycardic.  Treated empirically for DVT with Eliquis.  Advised him to return to [facility] tomorrow for ambulatory ultrasound DVT study of his right lower extremity.  Vitals remained stable throughout encounter.  Discharged home in good condition.        Final Clinical Impression(s) / ED  Diagnoses Final diagnoses:  Leg swelling    Rx / DC Orders ED Discharge Orders          Ordered    LE Venous       Comments: IMPORTANT PATIENT INSTRUCTIONS:  You have been scheduled for an Outpatient Vascular Study at [facility].    If tomorrow is a Saturday, Sunday or holiday, please go to the [facility] Emergency Department Registration Desk at 11 am tomorrow morning and tell them you are there for a vascular study.   If tomorrow is a weekday (Monday-Friday), please go to [facility] Entrance C, [facility] Registration at 11 am and tell them you are there for a vascular study.   03/08/23 0124              Gareth Eagle, PA-C 03/08/23 0125    Sabas Sous, MD 03/08/23 [phone]

## 2023-03-08 NOTE — ED Notes (Signed)
Patient to room via wc c/o 3 day hx of right lower leg bruising and swelling. Pt diabetic denies hittingl eg has hx of dvt to other leg is supposed to be on thinners not taking states I can't afford them. Patient able to ambulate with steady gait . Pt a/o x 4 respirations even and non labored . Right lower leg is swollen and has bruising currently

## 2023-03-08 NOTE — Discharge Instructions (Addendum)
Evaluation today revealed that you may have a DVT.  I am treating you with a one-time dose of Eliquis and asked that you come back tomorrow for an ultrasound of your right leg to evaluate for possible DVT.  If it is positive, you will need to return to the emergency department for further evaluation and management.  If the study is negative, recommend you follow-up with your PCP for your ongoing leg swelling.

## 2023-04-09 NOTE — Progress Notes (Signed)
[facility]   [phone] [phone]   Clinic New Consult Note   Patient Care Team: Claiborne Rigg, NP as PCP - General (Nurse Practitioner)  Date of Service:  04/10/2023   CHIEF COMPLAINTS/PURPOSE OF CONSULTATION:   Recurrent DVT  REFERRING PHYSICIAN:  Roemhildt, Lorin T, PA-C   HISTORY OF PRESENTING ILLNESS:  David Dunlap 52 y.o. male is a here because of DVT. The patient was referred by Roemhildt, Lorin T, PA-C . The patient presents to the clinic today alone.   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  According to the epic record, he is a first episode of DVT was in July 2025 in the right lower extremity, unprovoked, he was on Xarelto for 6 to 8 months.  Subsequent Doppler of lower extremity in March or 2016 showed chronic DVT in right lower extremity and acute DVT in left leg.  Again Doppler in October 2019 showed a negative DVT in the right lower extremity, and acute DVT in left leg.  Doppler in March 2022 showed acute DVT in right lower extremity, and a chronic DVT in left leg.  His recent Doppler on March 08, 2023 in the ED showed age indeterminate DVT involving the right popliteal vein, negative in left lower extremity.  He presented with leg pain and mild edema.  He previously received Coumadin but did not tolerated well.  He was on Eliquis to for most of his episodes of DVT for several months, then came off.  He has never seen by hematologist in the past.  Pt is a Investment banker, operational, has 2 jobs, state that he works a lot at least 12 hrs on his feet.  He is otherwise not physically active.  He does not exercise.  He is obese with BMI 41.  He does not smoke, drinks alcohol occasionally.  No family history of thrombosis.   He has a PMHx of.... Depression Arthritis CHF Diabetes Mellitus GERD Hypertension Sickle Cell Anemia  Socially... Single 5 children   REVIEW OF SYSTEMS:    Constitutional: (-)Denies fevers, chills or abnormal night sweats Eyes: (-)Denies blurriness of vision, double vision or watery eyes Ears, nose, mouth, throat, and face: Denies mucositis or sore throat Respiratory:(-) Denies cough, dyspnea or wheezes Cardiovascular: (-)Denies palpitation, chest discomfort or lower extremity swelling Gastrointestinal:  (-)Denies nausea, heartburn or change in bowel habits Skin: (-) Denies abnormal skin rashes Lymphatics:(-)  Denies new lymphadenopathy or easy bruising Neurological:Denies numbness, tingling or new weaknesses Behavioral/Psych: (-)Mood is stable, no new changes  All other systems were reviewed with the patient and are negative.   MEDICAL HISTORY:  Past Medical History:  Diagnosis Date   Arthritis    Depression    Diabetes mellitus without complication (HCC) 05/2016   NEW ONSET    WITH FAMILY HISTORY   DVT (deep venous thrombosis) (HCC)    Essential hypertension    Gallstones 05/2016   GERD (gastroesophageal reflux disease)    Sickle cell anemia (HCC)    Sickle trait   Sleep apnea    Cpap    SURGICAL HISTORY: Past Surgical History:  Procedure Laterality Date   CHOLECYSTECTOMY N/A 06/07/2016   Procedure: LAPAROSCOPIC CHOLECYSTECTOMY WITH ATTEMPTED INTRAOPERATIVE CHOLANGIOGRAM;  Surgeon: Violeta Gelinas, MD;  Location: [facility] OR;  Service: General;  Laterality: N/A;   LIGAMENT REPAIR     R forearm   TOTAL KNEE ARTHROPLASTY Right 04/17/2017  Procedure: RIGHT TOTAL KNEE ARTHROPLASTY;  Surgeon: Tarry Kos, MD;  Location: [facility] OR;  Service: Orthopedics;  Laterality: Right;   TOTAL KNEE ARTHROPLASTY Left 04/28/2018   Procedure: LEFT TOTAL KNEE ARTHROPLASTY;  Surgeon: Tarry Kos, MD;  Location: [facility] OR;  Service: Orthopedics;  Laterality: Left;    SOCIAL HISTORY: Social History   Socioeconomic History   Marital status: Single    Spouse name: Not on file   Number of children: 5   Years of education: Not on file   Highest education  level: Not on file  Occupational History   Not on file  Tobacco Use   Smoking status: Never   Smokeless tobacco: Never  Vaping Use   Vaping status: Never Used  Substance and Sexual Activity   Alcohol use: Yes    Alcohol/week: 1.0 - 2.0 standard drink of alcohol    Types: 1 - 2 Cans of beer per week    Comment: doesn't drink all the drink   Drug use: No    Comment: last time 1989   Sexual activity: Not on file  Other Topics Concern   Not on file  Social History Narrative   Not on file   Social Determinants of Health   Financial Resource Strain: Not on file  Food Insecurity: Not on file  Transportation Needs: Not on file  Physical Activity: Not on file  Stress: Not on file  Social Connections: Not on file  Intimate Partner Violence: Not on file    FAMILY HISTORY: Family History  Problem Relation Age of Onset   Diabetes Mother    Diabetes Father    Hypertension Sister     ALLERGIES:  is allergic to metformin and related.  MEDICATIONS:  Current Outpatient Medications  Medication Sig Dispense Refill   APIXABAN (ELIQUIS) VTE STARTER PACK (10MG  AND 5MG ) Take as directed on package: start with two-5mg  tablets twice daily for 7 days. On day 8, switch to one-5mg  tablet twice daily. 74 each 0   APIXABAN (ELIQUIS) VTE STARTER PACK (10MG  AND 5MG ) Take as directed on package: start with two-5mg  tablets twice daily for 7 days. On day 8, switch to one-5mg  tablet twice daily. 74 each 0   atorvastatin (LIPITOR) 20 MG tablet Take 1 tablet (20 mg total) by mouth daily. FOR CHOLESTEROL 90 tablet 1   Blood Glucose Monitoring Suppl (TRUE METRIX METER) DEVI 1 each by Does not apply route 3 (three) times daily before meals. 1 Device 0   cetirizine (ZYRTEC) 10 MG tablet Take 1 tablet (10 mg total) by mouth daily. 90 tablet 1   diclofenac (VOLTAREN) 75 MG EC tablet Take 1 tablet (75 mg total) by mouth 2 (two) times daily as needed. 60 tablet 2   famotidine (PEPCID) 20 MG tablet Take 1 tablet  (20 mg total) by mouth daily. 30 tablet 2   FLUoxetine (PROZAC) 20 MG tablet Take 2 tablets (40 mg total) by mouth daily. FOR ANXIETY AND DEPRESSION 60 tablet 3   gabapentin (NEURONTIN) 300 MG capsule Take 1 capsule (300 mg total) by mouth 3 (three) times daily. FOR NEUROPATHY 90 capsule 5   glipiZIDE (GLUCOTROL) 10 MG tablet Take 1 tablet (10 mg total) by mouth 2 (two) times daily before a meal. FOR DIABETES 60 tablet 5   glucose blood (TRUE METRIX BLOOD GLUCOSE TEST) test strip Used daily before meals 100 each 12   hydrochlorothiazide (HYDRODIURIL) 25 MG tablet Take 1 tablet (25 mg total) by mouth daily. FOR HIGH  BLOOD PRESSURE 90 tablet 1   hydrOXYzine (ATARAX) 25 MG tablet Take 1 tablet (25 mg total) by mouth every 8 (eight) hours as needed. FOR ANXIETY 60 tablet 1   Insulin Glargine (BASAGLAR KWIKPEN) 100 UNIT/ML Inject 10 Units into the skin daily. 15 mL 1   Insulin Pen Needle (TECHLITE PEN NEEDLES) 32G X 4 MM MISC Use to inject Victoza and Lantus. 2 pen needles per day. 100 each 2   lisinopril (ZESTRIL) 10 MG tablet Take 1 tablet (10 mg total) by mouth daily. FOR HIGH BLOOD PRESSURE 90 tablet 1   Semaglutide, 1 MG/DOSE, 4 MG/3ML SOPN Inject 1 mg as directed once a week. Start after 4 weeks of the 0.5mg  dose. 3 mL 2   Semaglutide,0.25 or 0.5MG /DOS, (OZEMPIC, 0.25 OR 0.5 MG/DOSE,) 2 MG/3ML SOPN Inject 0.5 mg into the skin once a week. For 4 weeks. 3 mL 0   sildenafil (VIAGRA) 50 MG tablet Take 1 tablet (50 mg total) by mouth daily as needed for erectile dysfunction (at least 24 hours between doses). 10 tablet 1   tiZANidine (ZANAFLEX) 4 MG tablet Take 1 tablet (4 mg total) by mouth every 8 (eight) hours as needed for muscle spasms. 15 tablet 0   triamcinolone (KENALOG) 0.025 % ointment Apply 1 application topically 2 (two) times daily as needed. SKIN RASH 60 g 0   TRUEplus Lancets 28G MISC Use daily before meals 30 each 12   No current facility-administered medications for this visit.     PHYSICAL EXAMINATION: ECOG PERFORMANCE STATUS: 0 - Asymptomatic  Vitals:   04/10/23 1502  BP: (!) 143/88  Pulse: 80  Resp: 17  Temp: 98 F (36.7 C)  SpO2: 97%   Filed Weights   04/10/23 1502  Weight: 277 lb 1.6 oz (125.7 kg)     GENERAL:alert, no distress and comfortable SKIN: skin color normal, no rashes or significant lesions EYES: normal, Conjunctiva are pink and non-injected, sclera clear  NEURO: alert & oriented x 3 with fluent speech NECK: (-)supple, thyroid normal size, non-tender, without nodularity LYMPH: (-)  no palpable lymphadenopathy in the cervical, axillary  LUNGS: (-) clear to auscultation and percussion with normal breathing effort HEART: (-) regular rate & rhythm and no murmurs and no lower extremity edema ABDOMEN:(-) abdomen soft, non-tender and normal bowel sounds   LABORATORY DATA:  I have reviewed the data as listed    Latest Ref Rng & Units 03/07/2023    8:39 PM 01/05/2022    2:21 PM 10/06/2021    2:39 PM  CBC  WBC 4.0 - 10.5 K/uL 13.1  13.1  14.3   Hemoglobin 13.0 - 17.0 g/dL 13.2  44.0  10.2   Hematocrit 39.0 - 52.0 % 43.8  45.7  48.1   Platelets 150 - 400 K/uL 177  196  216        Latest Ref Rng & Units 03/07/2023    8:39 PM 01/05/2022    2:21 PM 10/06/2021    2:39 PM  CMP  Glucose 70 - 99 mg/dL 725  366  440   BUN 6 - 20 mg/dL 13  12  17    Creatinine 0.61 - 1.24 mg/dL 3.47  4.25  9.56   Sodium 135 - 145 mmol/L 135  141  135   Potassium 3.5 - 5.1 mmol/L 3.7  4.7  4.6   Chloride 98 - 111 mmol/L 99  102  99   CO2 22 - 32 mmol/L 21  24  22  Calcium 8.9 - 10.3 mg/dL 9.1  9.3  9.4   Total Protein 6.0 - 8.5 g/dL  7.2  7.5   Total Bilirubin 0.0 - 1.2 mg/dL  0.4  0.3   Alkaline Phos 44 - 121 IU/L  74  80   AST 0 - 40 IU/L  11  10   ALT 0 - 44 IU/L  27  30      RADIOGRAPHIC STUDIES: I have personally reviewed the radiological images as listed and agreed with the findings in the report. No results found.  ASSESSMENT & PLAN:  CAELEN REIERSON is a 52 y.o.  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 -All of his DVT episodes were unprovoked, but he does have some risk factors for thrombosis, including morbid obesity, sedentary lifestyle.  -I recommend lifelong anticoagulation.  He is tolerating Eliquis well, will continue -I also recommend a limited hypercoagulopathy workup, given his young age of recurrent thrombosis.  Will check Antithrombin III antigen, prothrombin gene mutation and factor V Leiden mutation  -I also discussed cancer screening, he is not symptomatic, no clinical concern for cancer.  I will obtain PSA level today, and refer him to GI Dr. Elnoria Howard for screening colonoscopy. -I encouraged him to follow-up with primary care physician, -Will do a phone visit in 3 months, to ensure his adherence to anticoagulation, then see him as needed afterwards.     PLAN:  -I recommend staying on Eliquise indefinitely - I  recommend staying active /exercise -Recommend wearing compression socks -do genetic testing for DVT -I referred to GI for colonoscopy -I order PSA test -phone visit in 3 months   Orders Placed This Encounter  Procedures   Antithrombin III antigen    Standing Status:   Future    Number of Occurrences:   1    Standing Expiration Date:   04/09/2024   Factor 5 leiden   Prothrombin gene mutation   Prostate-Specific AG, Serum    Standing Status:   Future    Number of Occurrences:   1    Standing Expiration Date:   04/09/2024   Ambulatory referral to Gastroenterology    Referral Priority:   Routine    Referral Type:   Consultation    Referral Reason:   Specialty Services Required    Number of Visits Requested:   1    All questions were answered. The patient knows to  call the clinic with any problems, questions or concerns. The total time spent in the appointment was 30 minutes.     Malachy Mood, MD 04/10/2023 5:20 PM  I, Monica [name], am acting as scribe for Malachy Mood, MD.   I have reviewed the above documentation for accuracy and completeness, and I agree with the above.

## 2023-04-10 ENCOUNTER — Inpatient Hospital Stay: Payer: 59 | Attending: Hematology | Admitting: Hematology

## 2023-04-10 ENCOUNTER — Encounter: Payer: Self-pay | Admitting: Hematology

## 2023-04-10 ENCOUNTER — Inpatient Hospital Stay: Payer: 59

## 2023-04-10 ENCOUNTER — Other Ambulatory Visit: Payer: Self-pay

## 2023-04-10 VITALS — BP 143/88 | HR 80 | Temp 98.0°F | Resp 17 | Wt 277.1 lb

## 2023-04-10 DIAGNOSIS — D6851 Activated protein C resistance: Secondary | ICD-10-CM | POA: Diagnosis not present

## 2023-04-10 DIAGNOSIS — E119 Type 2 diabetes mellitus without complications: Secondary | ICD-10-CM | POA: Diagnosis not present

## 2023-04-10 DIAGNOSIS — G4730 Sleep apnea, unspecified: Secondary | ICD-10-CM | POA: Insufficient documentation

## 2023-04-10 DIAGNOSIS — I82503 Chronic embolism and thrombosis of unspecified deep veins of lower extremity, bilateral: Secondary | ICD-10-CM | POA: Insufficient documentation

## 2023-04-10 DIAGNOSIS — M1990 Unspecified osteoarthritis, unspecified site: Secondary | ICD-10-CM | POA: Insufficient documentation

## 2023-04-10 DIAGNOSIS — Z1211 Encounter for screening for malignant neoplasm of colon: Secondary | ICD-10-CM

## 2023-04-10 DIAGNOSIS — Z79899 Other long term (current) drug therapy: Secondary | ICD-10-CM | POA: Insufficient documentation

## 2023-04-10 DIAGNOSIS — Z6841 Body Mass Index (BMI) 40.0 and over, adult: Secondary | ICD-10-CM | POA: Insufficient documentation

## 2023-04-10 DIAGNOSIS — D571 Sickle-cell disease without crisis: Secondary | ICD-10-CM | POA: Insufficient documentation

## 2023-04-10 DIAGNOSIS — K219 Gastro-esophageal reflux disease without esophagitis: Secondary | ICD-10-CM | POA: Insufficient documentation

## 2023-04-10 DIAGNOSIS — Z125 Encounter for screening for malignant neoplasm of prostate: Secondary | ICD-10-CM

## 2023-04-10 DIAGNOSIS — I110 Hypertensive heart disease with heart failure: Secondary | ICD-10-CM | POA: Insufficient documentation

## 2023-04-10 DIAGNOSIS — Z7984 Long term (current) use of oral hypoglycemic drugs: Secondary | ICD-10-CM | POA: Insufficient documentation

## 2023-04-10 DIAGNOSIS — Z794 Long term (current) use of insulin: Secondary | ICD-10-CM | POA: Insufficient documentation

## 2023-04-10 DIAGNOSIS — I10 Essential (primary) hypertension: Secondary | ICD-10-CM | POA: Diagnosis not present

## 2023-04-10 DIAGNOSIS — Z7985 Long-term (current) use of injectable non-insulin antidiabetic drugs: Secondary | ICD-10-CM | POA: Diagnosis not present

## 2023-04-10 DIAGNOSIS — I509 Heart failure, unspecified: Secondary | ICD-10-CM | POA: Insufficient documentation

## 2023-04-11 ENCOUNTER — Telehealth: Payer: Self-pay | Admitting: Hematology

## 2023-04-11 ENCOUNTER — Other Ambulatory Visit: Payer: Self-pay

## 2023-04-11 LAB — ANTITHROMBIN III ANTIGEN: AT III AG PPP IMM-ACNC: 84 % (ref 72–124)

## 2023-04-12 ENCOUNTER — Other Ambulatory Visit: Payer: Self-pay

## 2023-04-29 ENCOUNTER — Telehealth: Payer: Self-pay

## 2023-04-29 NOTE — Telephone Encounter (Addendum)
Called to relay message below as per Dr. Mosetta Putt. Patient voiced full understanding.   ----- Message from Malachy Mood sent at 04/28/2023  7:34 PM EDT ----- Please let pt know his blood tests for genetic cause of blood clot was negative and PSA was normal. Thanks   Malachy Mood

## 2023-05-21 ENCOUNTER — Ambulatory Visit: Payer: 59 | Admitting: Nurse Practitioner

## 2023-06-18 ENCOUNTER — Ambulatory Visit (INDEPENDENT_AMBULATORY_CARE_PROVIDER_SITE_OTHER): Payer: 59 | Admitting: Family Medicine

## 2023-06-18 ENCOUNTER — Encounter (HOSPITAL_COMMUNITY): Payer: Self-pay

## 2023-06-18 ENCOUNTER — Ambulatory Visit (HOSPITAL_COMMUNITY)
Admission: EM | Admit: 2023-06-18 | Discharge: 2023-06-18 | Disposition: A | Discharge status: Home / Self Care | Payer: 59 | Attending: Emergency Medicine | Admitting: Emergency Medicine

## 2023-06-18 ENCOUNTER — Other Ambulatory Visit: Payer: Self-pay

## 2023-06-18 ENCOUNTER — Ambulatory Visit (INDEPENDENT_AMBULATORY_CARE_PROVIDER_SITE_OTHER): Payer: 59

## 2023-06-18 VITALS — BP 134/73 | HR 60 | Ht 69.0 in | Wt 270.0 lb

## 2023-06-18 DIAGNOSIS — G8929 Other chronic pain: Secondary | ICD-10-CM

## 2023-06-18 DIAGNOSIS — M25512 Pain in left shoulder: Secondary | ICD-10-CM

## 2023-06-18 DIAGNOSIS — M19012 Primary osteoarthritis, left shoulder: Secondary | ICD-10-CM | POA: Diagnosis not present

## 2023-06-18 MED ORDER — NAPROXEN 375 MG PO TABS
375.0000 mg | ORAL_TABLET | Freq: Two times a day (BID) | ORAL | 0 refills | Status: DC | PRN
  Filled 2023-06-18: qty 14, 7d supply, fill #0

## 2023-06-18 MED ORDER — TIZANIDINE HCL 4 MG PO TABS
4.0000 mg | ORAL_TABLET | Freq: Three times a day (TID) | ORAL | 0 refills | Status: DC | PRN
  Filled 2023-06-18: qty 15, 5d supply, fill #0

## 2023-06-18 NOTE — ED Provider Notes (Signed)
[facility]    CSN: [id number] Arrival date & time: 06/18/23  3329      History   Chief Complaint Chief Complaint  Patient presents with   Arm Pain    HPI David Dunlap is a 52 y.o. male. Patient c/o left shoulder x 4 weeks and arm tingling x 3 weeks. Arm tingling is whole arm and sometimes fingers, not partial arm or fingers. Denies change in strength. Patient states he does heavy lifting at work. Has reduced ROM of L shoulder as well. Tried tylenol yesterday for no relief, otherwise has not tried treatments for this problem.      Arm Pain    Past Medical History:  Diagnosis Date   Arthritis    Depression    Diabetes mellitus without complication (HCC) 05/2016   NEW ONSET    WITH FAMILY HISTORY   DVT (deep venous thrombosis) (HCC)    Essential hypertension    Gallstones 05/2016   GERD (gastroesophageal reflux disease)    Sickle cell anemia (HCC)    Sickle trait   Sleep apnea    Cpap    Patient Active Problem List   Diagnosis Date Noted   Morbid obesity (HCC) 07/22/2018   Body mass index 40.0-44.9, adult (HCC) 07/22/2018   Primary osteoarthritis of left knee    Unilateral primary osteoarthritis, left knee 04/10/2018   Obstructive sleep apnea 09/05/2017   GERD (gastroesophageal reflux disease) 11/27/2016   S/p total knee replacement, bilateral 10/18/2016   Neuropathy 07/25/2016   Acute cholecystitis 06/05/2016   Diabetes mellitus without complication (HCC) 05/18/2016   Cholelithiasis 04/09/2016   Major depressive disorder, single episode 04/09/2016   Leg DVT (deep venous thromboembolism), chronic (HCC) 12/08/2014   Family history of diabetes mellitus (DM) 12/08/2014   Moderate major depression, single episode (HCC) 08/22/2011   Constipation 11/22/2010   ANKLE EDEMA 10/19/2010   Essential hypertension 10/12/2010    Past Surgical History:  Procedure Laterality Date   CHOLECYSTECTOMY N/A 06/07/2016   Procedure: LAPAROSCOPIC CHOLECYSTECTOMY  WITH ATTEMPTED INTRAOPERATIVE CHOLANGIOGRAM;  Surgeon: Violeta Gelinas, MD;  Location: [facility] OR;  Service: General;  Laterality: N/A;   LIGAMENT REPAIR     R forearm   TOTAL KNEE ARTHROPLASTY Right 04/17/2017   Procedure: RIGHT TOTAL KNEE ARTHROPLASTY;  Surgeon: Tarry Kos, MD;  Location: [facility];  Service: Orthopedics;  Laterality: Right;   TOTAL KNEE ARTHROPLASTY Left 04/28/2018   Procedure: LEFT TOTAL KNEE ARTHROPLASTY;  Surgeon: Tarry Kos, MD;  Location: [facility];  Service: Orthopedics;  Laterality: Left;       Home Medications    Prior to Admission medications   Medication Sig Start Date End Date Taking? Authorizing Provider  naproxen (NAPROSYN) 375 MG tablet Take 1 tablet (375 mg total) by mouth 2 (two) times daily as needed. 06/18/23  Yes Cathlyn Parsons, NP  APIXABAN Everlene Balls) VTE STARTER PACK (10MG  AND 5MG ) Take as directed on package: start with two-5mg  tablets twice daily for 7 days. On day 8, switch to one-5mg  tablet twice daily. 03/08/23   Terald Sleeper, MD  APIXABAN Everlene Balls) VTE STARTER PACK (10MG  AND 5MG ) Take as directed on package: start with two-5mg  tablets twice daily for 7 days. On day 8, switch to one-5mg  tablet twice daily. 03/08/23   Roemhildt, Lorin T, PA-C  atorvastatin (LIPITOR) 20 MG tablet Take 1 tablet (20 mg total) by mouth daily. FOR CHOLESTEROL 05/09/22 07/11/23  Anders Simmonds, PA-C  Blood Glucose Monitoring Suppl (TRUE METRIX METER)  DEVI 1 each by Does not apply route 3 (three) times daily before meals. 07/25/16   Hoy Register, MD  cetirizine (ZYRTEC) 10 MG tablet Take 1 tablet (10 mg total) by mouth daily. 01/09/21 04/09/21  Claiborne Rigg, NP  diclofenac (VOLTAREN) 75 MG EC tablet Take 1 tablet (75 mg total) by mouth 2 (two) times daily as needed. 12/25/22   Cristie Hem, PA-C  famotidine (PEPCID) 20 MG tablet Take 1 tablet (20 mg total) by mouth daily. 12/03/22   Rising, Lurena Joiner, PA-C  FLUoxetine (PROZAC) 20 MG tablet Take 2 tablets (40 mg total) by  mouth daily. FOR ANXIETY AND DEPRESSION Patient not taking: Reported on 06/18/2023 05/09/22   Anders Simmonds, PA-C  gabapentin (NEURONTIN) 300 MG capsule Take 1 capsule (300 mg total) by mouth 3 (three) times daily. FOR NEUROPATHY 05/09/22   Anders Simmonds, PA-C  glipiZIDE (GLUCOTROL) 10 MG tablet Take 1 tablet (10 mg total) by mouth 2 (two) times daily before a meal. FOR DIABETES 05/09/22   Anders Simmonds, PA-C  glucose blood (TRUE METRIX BLOOD GLUCOSE TEST) test strip Used daily before meals 09/26/21   Claiborne Rigg, NP  hydrochlorothiazide (HYDRODIURIL) 25 MG tablet Take 1 tablet (25 mg total) by mouth daily. FOR HIGH BLOOD PRESSURE 05/09/22 07/11/23  Anders Simmonds, PA-C  hydrOXYzine (ATARAX) 25 MG tablet Take 1 tablet (25 mg total) by mouth every 8 (eight) hours as needed. FOR ANXIETY 05/09/22   Anders Simmonds, PA-C  Insulin Glargine [facility] KWIKPEN) 100 UNIT/ML Inject 10 Units into the skin daily. 06/15/22   Hoy Register, MD  Insulin Pen Needle (TECHLITE PEN NEEDLES) 32G X 4 MM MISC Use to inject Victoza and Lantus. 2 pen needles per day. 05/09/22   Anders Simmonds, PA-C  lisinopril (ZESTRIL) 10 MG tablet Take 1 tablet (10 mg total) by mouth daily. FOR HIGH BLOOD PRESSURE 05/09/22 07/11/23  Anders Simmonds, PA-C  Semaglutide, 1 MG/DOSE, 4 MG/3ML SOPN Inject 1 mg as directed once a week. Start after 4 weeks of the 0.5mg  dose. 06/15/22   Hoy Register, MD  Semaglutide,0.25 or 0.5MG /DOS, (OZEMPIC, 0.25 OR 0.5 MG/DOSE,) 2 MG/3ML SOPN Inject 0.5 mg into the skin once a week. For 4 weeks. 06/15/22   Hoy Register, MD  sildenafil (VIAGRA) 50 MG tablet Take 1 tablet (50 mg total) by mouth daily as needed for erectile dysfunction (at least 24 hours between doses). 05/09/22   Anders Simmonds, PA-C  tiZANidine (ZANAFLEX) 4 MG tablet Take 1 tablet (4 mg total) by mouth every 8 (eight) hours as needed for muscle spasms. 06/18/23   Cathlyn Parsons, NP  triamcinolone (KENALOG) 0.025 %  ointment Apply 1 application topically 2 (two) times daily as needed. SKIN RASH 09/26/21   Claiborne Rigg, NP  TRUEplus Lancets 28G MISC Use daily before meals 09/26/21   Claiborne Rigg, NP    Family History Family History  Problem Relation Age of Onset   Diabetes Mother    Diabetes Father    Hypertension Sister     Social History Social History   Tobacco Use   Smoking status: Never   Smokeless tobacco: Never  Vaping Use   Vaping status: Never Used  Substance Use Topics   Alcohol use: Yes    Alcohol/week: 1.0 - 2.0 standard drink of alcohol    Types: 1 - 2 Cans of beer per week   Drug use: No    Comment: last time 1989  Allergies   Metformin and related   Review of Systems Review of Systems   Physical Exam Triage Vital Signs ED Triage Vitals  Encounter Vitals Group     BP 06/18/23 0915 134/73     Systolic BP Percentile --      Diastolic BP Percentile --      Pulse Rate 06/18/23 0915 60     Resp 06/18/23 0915 16     Temp 06/18/23 0915 98.2 F (36.8 C)     Temp Source 06/18/23 0915 Oral     SpO2 06/18/23 0915 98 %     Weight --      Height --      Head Circumference --      Peak Flow --      Pain Score 06/18/23 0918 9     Pain Loc --      Pain Education --      Exclude from Growth Chart --    No data found.  Updated Vital Signs BP 134/73 (BP Location: Right Arm)   Pulse 60   Temp 98.2 F (36.8 C) (Oral)   Resp 16   SpO2 98%   Visual Acuity Right Eye Distance:   Left Eye Distance:   Bilateral Distance:    Right Eye Near:   Left Eye Near:    Bilateral Near:     Physical Exam Constitutional:      Appearance: Normal appearance.  Pulmonary:     Effort: Pulmonary effort is normal.  Musculoskeletal:     Left shoulder: Tenderness present. Decreased range of motion. Normal strength. Normal pulse.     Comments: CAnnot lift L arm above shoulder height without pain. Pain with flexion and hyperextension. Pain with rotation of L shoulder.    Neurological:     Mental Status: He is alert.      UC Treatments / Results  Labs (all labs ordered are listed, but only abnormal results are displayed) Labs Reviewed - No data to display  EKG   Radiology No results found.  Procedures Procedures (including critical care time)  Medications Ordered in UC Medications - No data to display  Initial Impression / Assessment and Plan / UC Course  I have reviewed the triage vital signs and the nursing notes.  Pertinent labs & imaging results that were available during my care of the patient were reviewed by me and considered in my medical decision making (see chart for details).    I think he would benefit from eval by sports medicine where needed imaging and therapy can be arranged. Discussed options for care, given note for work. Rx naproxen and tizanadine  Final Clinical Impressions(s) / UC Diagnoses   Final diagnoses:  Pain in joint of left shoulder     Discharge Instructions      Contact one of the sports medicine clinics below for a follow up appointment  - sometimes the one on [facility] can get you an appointment that same day.   You can take the cyclobenzaprine (muscle relaxer) up to 3 times a day but it may make you sleepy, so if you need to drive or work, only take it at bedtime.   If you still have ketorolac (prescribed last May) or diclofenac (prescribed last April) at home, don't take them while you are taking the new prescription of naproxen (they are all similar medicines)   ED Prescriptions     Medication Sig Dispense Auth. Provider   tiZANidine (ZANAFLEX) 4 MG tablet  Take 1 tablet (4 mg total) by mouth every 8 (eight) hours as needed for muscle spasms. 15 tablet Cathlyn Parsons, NP   naproxen (NAPROSYN) 375 MG tablet Take 1 tablet (375 mg total) by mouth 2 (two) times daily as needed. 14 tablet Cathlyn Parsons, NP      PDMP not reviewed this encounter.   Cathlyn Parsons, NP 06/18/23 1214

## 2023-06-18 NOTE — Discharge Instructions (Addendum)
Contact one of the sports medicine clinics below for a follow up appointment  - sometimes the one on [facility] can get you an appointment that same day.   You can take the cyclobenzaprine (muscle relaxer) up to 3 times a day but it may make you sleepy, so if you need to drive or work, only take it at bedtime.   If you still have ketorolac (prescribed last May) or diclofenac (prescribed last April) at home, don't take them while you are taking the new prescription of naproxen (they are all similar medicines)

## 2023-06-18 NOTE — ED Triage Notes (Signed)
Patient c/o left shoulder and arm tingling x 4 weeks. Patient states he does heavy lifting at work.  Patient states he has been taking Tylenol and the alst dose was yesterday.

## 2023-06-18 NOTE — Progress Notes (Signed)
David Payor, PhD, LAT, ATC acting as a scribe for David Graham, MD.  David Dunlap is a 52 y.o. male who presents to [facility] at [facility] today for L shoulder pain x 4 wks. He is RHD. No MOI. He works at [employer] and does a lot of heavy lifting, he is the main cook. Pt was seen earlier today at the [facility] for this issue.  Pt locates pain to all over his L shoulder.  Neck pain: no Radiates: no UE numbness/tingling: yes- whole L arm to fingers Aggravates: aBd, overhead Treatments tried: Tylenol  Pertinent review of systems: No fevers or chills  Relevant historical information: Diabetes.  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   MSK: Left shoulder normal-appearing normal motion pain with abduction and internal rotation. Intact strength. Positive Hawkins and Neer's test.  Positive empty can test.  Negative Yergason's and speeds test.     Procedure: Real-time Ultrasound Guided Injection of left shoulder subacromial bursa Device: Philips Affiniti 50G/GE Logiq Images permanently stored and available for review in PACS Ultrasound evaluation prior to injection reveals intact rotator cuff tendons with mild subacromial bursitis and mild infraspinatus calcific tendinopathy. Verbal informed consent obtained.  Discussed risks and benefits of procedure. Warned about infection, bleeding, hyperglycemia damage to structures among others. Patient expresses understanding and agreement Time-out conducted.   Noted no overlying erythema, induration, or other signs of local infection.   Skin prepped in a sterile fashion.   Local anesthesia: Topical Ethyl chloride.   With sterile technique and under real time ultrasound guidance: 40  mg of Kenalog and 2 mL of Marcaine injected into subacromial bursa. Fluid seen entering the subacromial bursa.   Completed without difficulty   Pain moderately resolved suggesting accurate placement of the medication.   Advised to call if fevers/chills, erythema, induration, drainage, or persistent bleeding.   Images permanently stored and available for review in the ultrasound unit.  Impression: Technically successful ultrasound guided injection.    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  No acute fractures are visible. Await formal radiology review    Assessment and Plan: 52 y.o. male with left shoulder pain ongoing for about 1 month occurring without injury.  Patient has classic impingement symptoms and signs.  Most likely diagnosis is subacromial impingement and bursitis.  He is a good candidate for subacromial injection and physical therapy.   PDMP not reviewed this encounter. Orders Placed This Encounter  Procedures   [place] LIMITED JOINT SPACE STRUCTURES UP LEFT(NO LINKED CHARGES)    Order Specific Question:   Reason for Exam (SYMPTOM  OR DIAGNOSIS REQUIRED)    Answer:   left shoulder pain    Order Specific Question:   Preferred imaging location?    Answer:   [facility] Shoulder Left    Standing Status:   Future    Standing Expiration Date:   07/19/2023    Order Specific Question:   Reason for Exam (SYMPTOM  OR DIAGNOSIS REQUIRED)    Answer:   left shoulder pain    Order Specific Question:  Preferred imaging location?    Answer:   David Dunlap   Ambulatory referral to Physical Therapy    Referral Priority:   Routine    Referral Type:   Physical Medicine    Referral Reason:   Specialty Services Required    Requested Specialty:   Physical Therapy    Number of Visits Requested:   1   No orders of the defined types were placed in  this encounter.    Discussed warning signs or symptoms. Please see discharge instructions. Patient expresses understanding.   The above documentation has been reviewed and is accurate and complete David Dunlap, M.D.

## 2023-06-18 NOTE — Patient Instructions (Addendum)
Thank you for coming in today.    Shoulder bursitis  Please get an Xray today before you leave   You received an injection today. Seek immediate medical attention if the joint becomes red, extremely painful, or is oozing fluid.   I've referred you to Physical Therapy.  Let us know if you don't hear from them in one week.   Check back in 1 month

## 2023-06-19 ENCOUNTER — Other Ambulatory Visit: Payer: Self-pay

## 2023-07-08 NOTE — Progress Notes (Signed)
Left shoulder x-ray shows arthritis changes

## 2023-07-09 DIAGNOSIS — I82409 Acute embolism and thrombosis of unspecified deep veins of unspecified lower extremity: Secondary | ICD-10-CM | POA: Insufficient documentation

## 2023-07-09 NOTE — Assessment & Plan Note (Signed)
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 -All of his DVT episodes were unprovoked, but he does have some risk factors for thrombosis, including morbid obesity, sedentary lifestyle.  -I recommend lifelong anticoagulation.  He is tolerating Eliquis well, will continue

## 2023-07-10 ENCOUNTER — Inpatient Hospital Stay: Payer: 59 | Attending: Hematology | Admitting: Hematology

## 2023-07-10 ENCOUNTER — Ambulatory Visit: Payer: 59 | Attending: Family Medicine

## 2023-07-10 DIAGNOSIS — I82409 Acute embolism and thrombosis of unspecified deep veins of unspecified lower extremity: Secondary | ICD-10-CM

## 2023-07-10 NOTE — Progress Notes (Signed)
Patient was scheduled a follow-up phone visit today, I called him this morning but got no answer.  I called him again in the late afternoon around 4 PM, he answered the phone but was not able to talk. Will reschedule or let my nurse to check on him.  Malachy Mood  07/10/2023

## 2023-07-11 ENCOUNTER — Telehealth: Payer: Self-pay

## 2023-07-11 NOTE — Telephone Encounter (Signed)
Spoke with pt via telephone regarding genetic testing for DVT.  Informed pt that Dr. Mosetta Putt stated the genetic testing results for DVT were negative.  Stated that Dr. Mosetta Putt wants the pt to continue taking his apixaban (Eliquis) as prescribed and f/u with Dr. Mosetta Putt as needed.  Instructed pt to contact Dr. Latanya Maudlin office should he have additional questions or concerns.  Pt verbalized understanding and had no further questions or concerns.

## 2023-07-19 ENCOUNTER — Telehealth: Payer: Self-pay | Admitting: Family Medicine

## 2023-07-19 NOTE — Telephone Encounter (Signed)
Patient called to review his recent xray results.  Gave Dr Corey's response. He said that he is still having a lot of pain and would like to know what next steps would be?  Please advise.

## 2023-07-22 NOTE — Telephone Encounter (Signed)
Pt called again, looking for next steps. I did mention that he No Showed his PT appt and recommended he call to reschedule.  Not sure if we have other recommendations for his pain/numbness.

## 2023-07-22 NOTE — Telephone Encounter (Signed)
Forwarding to Dr. Denyse Amass to review and advise.   Per visit note 06/18/23:  Assessment and Plan: 52 y.o. male with left shoulder pain ongoing for about 1 month occurring without injury.  Patient has classic impingement symptoms and signs.  Most likely diagnosis is subacromial impingement and bursitis.  He is a good candidate for subacromial injection and physical therapy.   Per XR report:     Rodolph Bong, MD 07/08/2023  6:25 AM EDT     Left shoulder x-ray shows arthritis changes.

## 2023-07-23 ENCOUNTER — Ambulatory Visit: Payer: 59 | Admitting: Family Medicine

## 2023-07-23 NOTE — Telephone Encounter (Signed)
Pt has appt today at 3:45 PM.

## 2023-07-23 NOTE — Telephone Encounter (Signed)
Recommend return to clinic for reevaluation.  Consider steroid injection in a different part of the shoulder.

## 2023-07-23 NOTE — Progress Notes (Deleted)
   Rubin Payor, PhD, LAT, ATC acting as a scribe for Clementeen Graham, MD.  David Dunlap is a 52 y.o. male who presents to [facility] at [facility] today for 71-month f/u L shoulder pain. Pt was last seen by Dr. Denyse Amass on 06/18/23 and was given a L subacromial steroid injection. He was also referred to PT, but no-showed his 1st visit, and next visit is on 08/08/23.  Today, pt reports ***  Dx imaging: 06/18/23 L shoulder XR  Pertinent review of systems: ***  Relevant historical information: ***   Exam:  There were no vitals taken for this visit. General: Well Developed, well nourished, and in no acute distress.   MSK: ***    Lab and Radiology Results No results found for this or any previous visit (from the past 72 hour(s)). No results found.     Assessment and Plan: 52 y.o. male with ***   PDMP not reviewed this encounter. No orders of the defined types were placed in this encounter.  No orders of the defined types were placed in this encounter.    Discussed warning signs or symptoms. Please see discharge instructions. Patient expresses understanding.   ***

## 2023-08-08 ENCOUNTER — Encounter: Payer: Self-pay | Admitting: Physical Therapy

## 2023-08-08 ENCOUNTER — Other Ambulatory Visit: Payer: Self-pay

## 2023-08-08 ENCOUNTER — Ambulatory Visit: Payer: 59 | Attending: Family Medicine | Admitting: Physical Therapy

## 2023-08-08 DIAGNOSIS — M25512 Pain in left shoulder: Secondary | ICD-10-CM | POA: Insufficient documentation

## 2023-08-08 DIAGNOSIS — G8929 Other chronic pain: Secondary | ICD-10-CM | POA: Insufficient documentation

## 2023-08-08 DIAGNOSIS — M542 Cervicalgia: Secondary | ICD-10-CM | POA: Insufficient documentation

## 2023-08-08 DIAGNOSIS — M79602 Pain in left arm: Secondary | ICD-10-CM | POA: Diagnosis not present

## 2023-08-08 DIAGNOSIS — M6281 Muscle weakness (generalized): Secondary | ICD-10-CM | POA: Insufficient documentation

## 2023-08-08 DIAGNOSIS — R293 Abnormal posture: Secondary | ICD-10-CM | POA: Diagnosis not present

## 2023-08-08 NOTE — Patient Instructions (Signed)
Access Code: [facility] URL: [email or web address] Date: 08/08/2023 Prepared by: Rosana Hoes  Exercises - Seated Cervical Sidebending Stretch  - 2 x daily - 3 reps - 20 seconds hold - Banded Row  - 1 x daily - 2 sets - 10 reps - Shoulder External Rotation with Anchored Resistance  - 1 x daily - 2 sets - 10 reps

## 2023-08-08 NOTE — Therapy (Signed)
OUTPATIENT PHYSICAL THERAPY EVALUATION   Patient Name: David Dunlap MRN: [id number] DOB:08-13-1971, 52 y.o., male Today's Date: 08/08/2023   END OF SESSION:  PT End of Session - 08/08/23 1404     Visit Number 1    Number of Visits 9    Date for PT Re-Evaluation 10/03/23    Authorization Type [employer] / Rolene Arbour MCD secondary    PT Start Time 1415    PT Stop Time 1500    PT Time Calculation (min) 45 min    Activity Tolerance Patient tolerated treatment well    Behavior During Therapy [facility] for tasks assessed/performed             Past Medical History:  Diagnosis Date   Arthritis    Depression    Diabetes mellitus without complication (HCC) 05/2016   NEW ONSET    WITH FAMILY HISTORY   DVT (deep venous thrombosis) (HCC)    Essential hypertension    Gallstones 05/2016   GERD (gastroesophageal reflux disease)    Sickle cell anemia (HCC)    Sickle trait   Sleep apnea    Cpap   Past Surgical History:  Procedure Laterality Date   CHOLECYSTECTOMY N/A 06/07/2016   Procedure: LAPAROSCOPIC CHOLECYSTECTOMY WITH ATTEMPTED INTRAOPERATIVE CHOLANGIOGRAM;  Surgeon: Violeta Gelinas, MD;  Location: [facility] OR;  Service: General;  Laterality: N/A;   LIGAMENT REPAIR     R forearm   TOTAL KNEE ARTHROPLASTY Right 04/17/2017   Procedure: RIGHT TOTAL KNEE ARTHROPLASTY;  Surgeon: Tarry Kos, MD;  Location: [facility] OR;  Service: Orthopedics;  Laterality: Right;   TOTAL KNEE ARTHROPLASTY Left 04/28/2018   Procedure: LEFT TOTAL KNEE ARTHROPLASTY;  Surgeon: Tarry Kos, MD;  Location: [facility] OR;  Service: Orthopedics;  Laterality: Left;   Patient Active Problem List   Diagnosis Date Noted   Recurrent deep vein thrombosis (DVT) (HCC) 07/09/2023   Morbid obesity (HCC) 07/22/2018   Body mass index 40.0-44.9, adult (HCC) 07/22/2018   Primary osteoarthritis of left knee    Unilateral primary osteoarthritis, left knee 04/10/2018   Obstructive sleep apnea 09/05/2017   GERD (gastroesophageal reflux  disease) 11/27/2016   S/p total knee replacement, bilateral 10/18/2016   Neuropathy 07/25/2016   Acute cholecystitis 06/05/2016   Diabetes mellitus without complication (HCC) 05/18/2016   Cholelithiasis 04/09/2016   Major depressive disorder, single episode 04/09/2016   Leg DVT (deep venous thromboembolism), chronic (HCC) 12/08/2014   Family history of diabetes mellitus (DM) 12/08/2014   Moderate major depression, single episode (HCC) 08/22/2011   Constipation 11/22/2010   ANKLE EDEMA 10/19/2010   Essential hypertension 10/12/2010    PCP: Claiborne Rigg, NP  REFERRING PROVIDER: Rodolph Bong, MD  REFERRING DIAG: Chronic left shoulder pain  THERAPY DIAG:  Chronic left shoulder pain  Cervicalgia  Pain in left arm  Muscle weakness (generalized)  Abnormal posture  Rationale for Evaluation and Treatment: Rehabilitation  ONSET DATE: Started about a month ago   SUBJECTIVE:  SUBJECTIVE STATEMENT: Patient reports for the last month or so he has been having tingling sensation all down his left arm and into the first three fingers. When he tries to reach up it hurts the left shoulder and starts tingling. States he didn't do anything to hurt it, just all of a sudden it started hurting. Sometimes the pain could happen just when he is sitting, or when he is lying on the left side, and reaching overhead will aggravate it. He did get a cortisone shot in the left shoulder that didn't help.  Hand dominance: Right  PERTINENT HISTORY: See PMH above  PAIN:  Are you having pain? Yes:  NPRS scale: 7-8/10 Pain location: Left shoulder and arm Pain description: Tingling Aggravating factors: Happens when he is just sitting, reaching overhead, lying on left side Relieving factors: Nothing  PRECAUTIONS:  None  RED FLAGS: None   WEIGHT BEARING RESTRICTIONS: No  FALLS:  Has patient fallen in last 6 months? No  OCCUPATION: Cook at [employer], has to reach overhead  PLOF: Independent  PATIENT GOALS: Pain relief   OBJECTIVE:  Note: Objective measures were completed at Evaluation unless otherwise noted. DIAGNOSTIC FINDINGS:  Left shoulder X-ray 06/18/2023 IMPRESSION: Degenerative changes.  No acute osseous abnormalities.  PATIENT SURVEYS:  FOTO 55% functional status  COGNITION: Overall cognitive status: Within functional limits for tasks assessed     SENSATION: WFL  POSTURE: Rounded shoulder and forward head posture, slouched  CERVICAL ROM:   Active ROM A/PROM (deg) eval  Flexion 45  Extension 25  Right lateral flexion   Left lateral flexion   Right rotation 65  Left rotation 50   (Blank rows = not tested)  Eval: patient reports left upper trap pain and tightness with left rotation  UPPER EXTREMITY ROM:   Active ROM Right eval Left eval  Shoulder flexion 165 140  Shoulder extension    Shoulder abduction    Shoulder adduction    Shoulder internal rotation Reach to T12 Reach to L5  Shoulder external rotation Reach to T1 Reach to C7  Elbow flexion    Elbow extension    Wrist flexion    Wrist extension    Wrist ulnar deviation    Wrist radial deviation    Wrist pronation    Wrist supination    (Blank rows = not tested)  Eval: patient reports left shoulder pain with all shoulder motion  UPPER EXTREMITY MMT:  MMT Right eval Left eval  Shoulder flexion 5 4  Shoulder extension    Shoulder abduction 5 4  Shoulder adduction    Shoulder internal rotation 5 5  Shoulder external rotation 5 4  Middle trapezius    Lower trapezius    Elbow flexion    Elbow extension    Wrist flexion    Wrist extension    Wrist ulnar deviation    Wrist radial deviation    Wrist pronation    Wrist supination    Grip strength (lbs)    (Blank rows = not  tested)  Eval: patient reports left shoulder pain with all muscle testing on left  SPECIAL TESTS: Hawkins/Kennedy impingement test: positive on left Spurling's positive on left  JOINT MOBILITY TESTING:  GHJ hypomobile posterior glide  PALPATION:  Tender to palpation left upper trap, infraspinatus, middle and anterior deltoid    TODAY'S TREATMENT:    [facility] Adult PT Treatment:  DATE: 08/08/2023 Therapeutic Exercise: Seated upper trap stretch 2 x 20 sec on left Row with green x 10 Shoulder ER with green x 10 on left  PATIENT EDUCATION: Education details: Exam findings, POC, HEP, use of TPDN for future treatment Person educated: Patient Education method: Explanation, Demonstration, Tactile cues, Verbal cues, and Handouts Education comprehension: verbalized understanding, returned demonstration, verbal cues required, tactile cues required, and needs further education  HOME EXERCISE PROGRAM: Access Code: [facility]    ASSESSMENT: CLINICAL IMPRESSION: Patient is a 52 y.o. male who was seen today for physical therapy evaluation and treatment for chronic left shoulder and arm pain. He seems to have symptoms consistent with cervical radiculopathy on the left as well as left rotator cuff tendinopathy that are impacting his functional ability. He exhibits limitations in both his cervical and left shoulder motion, strength deficit of the left rotator cuff with pain while performing strength testing, postural deficits with rounded shoulders and forward head, tenderness of the left neck and shoulder musculature with increased muscle tension.    OBJECTIVE IMPAIRMENTS: decreased activity tolerance, decreased ROM, decreased strength, postural dysfunction, and pain.   ACTIVITY LIMITATIONS: carrying, lifting, sitting, sleeping, bathing, dressing, reach over head, and hygiene/grooming  PARTICIPATION LIMITATIONS: meal prep, cleaning, driving, and  occupation  PERSONAL FACTORS: Fitness, Past/current experiences, and Time since onset of injury/illness/exacerbation are also affecting patient's functional outcome.   REHAB POTENTIAL: Good  CLINICAL DECISION MAKING: Stable/uncomplicated  EVALUATION COMPLEXITY: Low   GOALS: Goals reviewed with patient? Yes  SHORT TERM GOALS: Target date: 09/05/2023  Patient will be I with initial HEP in order to progress with therapy. Baseline: HEP provided at eval Goal status: INITIAL  2.  Patient will report left shoulder pain </= 3/10 in order to reduce functional limitations Baseline: 7-8/10 pain Goal status: INITIAL  LONG TERM GOALS: Target date: 10/03/2023  Patient will be I with final HEP to maintain progress from PT. Baseline: HEP provided at eval Goal status: INITIAL  2.  Patient will report >/= 69% status on FOTO to indicate improved functional ability. Baseline: 55% functional status Goal status: INITIAL  3.  Patient will demonstrate left shoulder elevation >/= 160 deg to improve reaching up to overhead shelf at work Baseline: 140 deg Goal status: INITIAL  4.  Patient will exhibit left shoulder strength 5/5 MMT in order to improve lifting and carrying ability using the left arm Baseline: 4/5 MMT Goal status: INITIAL  5. Patient will demonstrate left cervical rotation >/= 60 deg in order to improve driving and reduce neck tension  Baseline: 50 deg  Goal status: INITIAL   PLAN: PT FREQUENCY: 1x/week  PT DURATION: 8 weeks  PLANNED INTERVENTIONS: 97164- PT Re-evaluation, 97110-Therapeutic exercises, 97530- Therapeutic activity, 97112- Neuromuscular re-education, 97535- Self Care, 09811- Manual therapy, 97014- Electrical stimulation (unattended), [phone]- Electrical stimulation (manual), Patient/Family education, Taping, Dry Needling, Joint mobilization, Joint manipulation, Spinal manipulation, Spinal mobilization, Cryotherapy, and Moist heat  PLAN FOR NEXT SESSION: Review HEP  and progress PRN, manual/TPDN for left upper trap and shoulder musculature, left shoulder mobs and stretching, initiate cervical retraction exercises, progress left rotator cuff strengthening and postural exercises   Rosana Hoes, PT, DPT, LAT, ATC 08/08/23  3:17 PM Phone: [phone] Fax: [phone]    For all possible CPT codes, reference the Planned Interventions line above.     Check all conditions that are expected to impact treatment: {Conditions expected to impact treatment:Morbid obesity, Diabetes mellitus, and Social determinants of health   If treatment provided at initial evaluation,  no treatment charged due to lack of authorization.

## 2023-09-03 ENCOUNTER — Telehealth: Payer: Self-pay

## 2023-09-03 ENCOUNTER — Ambulatory Visit: Payer: 59 | Attending: Family Medicine

## 2023-09-03 NOTE — Telephone Encounter (Signed)
PT called but was unable to leave voicemail regarding missed appointment today. This is his first no show, no current changes to schedule at this time.   David Dunlap   09/03/23 4:32 PM

## 2023-09-09 ENCOUNTER — Telehealth: Payer: Self-pay | Admitting: Physical Therapy

## 2023-09-09 ENCOUNTER — Ambulatory Visit: Payer: 59 | Admitting: Physical Therapy

## 2023-09-09 NOTE — Telephone Encounter (Signed)
Spoke with patient regarding 1 st no show. He was unaware that he had an appointment. Confirmed next appointment dates and times.

## 2023-09-16 ENCOUNTER — Ambulatory Visit: Payer: 59 | Admitting: Physical Therapy

## 2023-09-23 ENCOUNTER — Ambulatory Visit: Payer: 59 | Attending: Family Medicine | Admitting: Physical Therapy

## 2023-09-24 ENCOUNTER — Telehealth: Payer: Self-pay | Admitting: Physical Therapy

## 2023-09-24 NOTE — Telephone Encounter (Signed)
 Spoke with patient regarding 4th missed PT appointment. Patient was informed he would be discharged from PT and would need new PT referral to schedule future appointments. Patient expressed understanding.   Elaine Daring, PT, DPT, LAT, ATC 09/24/23  10:54 AM Phone: [phone] Fax: [phone]

## 2023-12-31 ENCOUNTER — Ambulatory Visit (HOSPITAL_COMMUNITY): Admission: EM | Admit: 2023-12-31 | Discharge: 2023-12-31 | Disposition: A | Discharge status: Home / Self Care

## 2023-12-31 ENCOUNTER — Encounter (HOSPITAL_COMMUNITY): Payer: Self-pay

## 2023-12-31 DIAGNOSIS — J302 Other seasonal allergic rhinitis: Secondary | ICD-10-CM

## 2023-12-31 DIAGNOSIS — J309 Allergic rhinitis, unspecified: Secondary | ICD-10-CM | POA: Diagnosis not present

## 2023-12-31 DIAGNOSIS — R0981 Nasal congestion: Secondary | ICD-10-CM

## 2023-12-31 NOTE — ED Triage Notes (Signed)
 Pt states that he has some nasal congestion, sneezing and coughing. X3 days

## 2023-12-31 NOTE — Discharge Instructions (Signed)
 Based on your symptoms today I believe that you likely have allergies I have attached some information for you to review to help reduce your contact with triggers and reduce symptom burden I recommend the following at this time:  Start taking a second generation antihistamine such as Allegra, Claritin, Zyrtec, Xyzal, etc (the generics of these medications are typically just as effective and less expensive). If you are having trouble finding them, ask the pharmacist to point them out for you.  I also recommend using Flonase nasal spray to further assist with nasal congestion and runny nose. For your eye symptoms you can use an antihistamine eyedrop such as ketotifen or olopatadine.  I recommend 1 drop in each eye twice per day to assist with symptoms. If desired you can use lubricating eyedrops such as blink tears or refresh eyedrops to help with gritty sensation or dry eyes. I also recommend watching the air-quality forecast and take note of high pollen days to help reduce your symptoms If you think other environmental allergens are causing your symptoms (such as mold, pet dander, fragrances, detergents, etc.) please try to identify them and remove them from your daily life.  This can mean cleaning your home, bedding, clothing and other aspects of your environment a bit more often or with different products that are appropriate for allergies.   If you feel your symptoms are getting worse, you start to develop fevers and not responding to medications, difficulty breathing, shortness of breath or chest pain please go to the emergency room as these could be signs of medical emergency.

## 2023-12-31 NOTE — ED Provider Notes (Signed)
 [facility]    CSN: [id number] Arrival date & time: 12/31/23  1116      History   Chief Complaint Chief Complaint  Patient presents with   Nasal Congestion    Nasal congestion, sneezing and cough x3 days    HPI David Dunlap is a 53 y.o. male.   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   Morbid obesity (HCC) 07/22/2018   Body mass index 40.0-44.9, adult (HCC) 07/22/2018   Primary osteoarthritis of left knee    Unilateral primary osteoarthritis, left knee 04/10/2018   Obstructive sleep apnea 09/05/2017   GERD (gastroesophageal reflux disease) 11/27/2016   S/p total knee replacement, bilateral 10/18/2016   Neuropathy 07/25/2016   Acute cholecystitis 06/05/2016   Diabetes mellitus without complication (HCC) 05/18/2016   Cholelithiasis 04/09/2016   Major depressive disorder, single episode 04/09/2016   Leg DVT (deep venous thromboembolism), chronic (HCC) 12/08/2014   Family history of diabetes mellitus (DM) 12/08/2014   Moderate major depression, single episode (HCC) 08/22/2011   Constipation 11/22/2010   ANKLE EDEMA 10/19/2010   Essential hypertension 10/12/2010    Past Surgical History:  Procedure Laterality Date   CHOLECYSTECTOMY N/A 06/07/2016   Procedure: LAPAROSCOPIC CHOLECYSTECTOMY WITH ATTEMPTED INTRAOPERATIVE CHOLANGIOGRAM;  Surgeon: Violeta Gelinas, MD;   Location: [facility];  Service: General;  Laterality: N/A;   LIGAMENT REPAIR     R forearm   TOTAL KNEE ARTHROPLASTY Right 04/17/2017   Procedure: RIGHT TOTAL KNEE ARTHROPLASTY;  Surgeon: Tarry Kos, MD;  Location: [facility];  Service: Orthopedics;  Laterality: Right;   TOTAL KNEE ARTHROPLASTY Left 04/28/2018   Procedure: LEFT TOTAL KNEE ARTHROPLASTY;  Surgeon: Tarry Kos, MD;  Location: [facility];  Service: Orthopedics;  Laterality: Left;       Home Medications    Prior to Admission medications   Medication Sig Start Date End Date Taking? Authorizing Provider  APIXABAN (ELIQUIS) VTE STARTER PACK (10MG  AND 5MG ) Take as directed on package: start with two-5mg  tablets twice daily for 7 days. On day 8, switch to one-5mg  tablet twice daily. 03/08/23  Yes Trifan, Kermit Balo, MD  APIXABAN Everlene Balls) VTE STARTER PACK (10MG  AND 5MG ) Take as directed on package: start with two-5mg  tablets twice daily for 7 days. On day 8, switch to one-5mg  tablet twice daily. 03/08/23  Yes Roemhildt, Lorin T, PA-C  Blood Glucose Monitoring Suppl (TRUE METRIX METER) DEVI 1 each by Does not apply route 3 (three) times daily before meals. 07/25/16  Yes Hoy Register, MD  diclofenac (VOLTAREN) 75 MG EC tablet Take 1 tablet (75 mg total) by mouth 2 (two) times daily as needed. 12/25/22  Yes Cristie Hem, PA-C  famotidine (PEPCID) 20 MG tablet Take 1 tablet (20 mg total) by mouth daily. 12/03/22  Yes Rising,  Lurena Joiner, PA-C  gabapentin (NEURONTIN) 300 MG capsule Take 1 capsule (300 mg total) by mouth 3 (three) times daily. FOR NEUROPATHY 05/09/22  Yes McClung, Angela M, PA-C  glipiZIDE (GLUCOTROL) 10 MG tablet Take 1 tablet (10 mg total) by mouth 2 (two) times daily before a meal. FOR DIABETES 05/09/22  Yes Georgian Co M, PA-C  glucose blood (TRUE METRIX BLOOD GLUCOSE TEST) test strip Used daily before meals 09/26/21  Yes Claiborne Rigg, NP  hydrOXYzine (ATARAX) 25 MG tablet Take 1 tablet (25 mg total) by mouth every 8 (eight) hours  as needed. FOR ANXIETY 05/09/22  Yes Anders Simmonds, PA-C  Insulin Glargine (BASAGLAR KWIKPEN) 100 UNIT/ML Inject 10 Units into the skin daily. 06/15/22  Yes Newlin, Odette Horns, MD  Insulin Pen Needle (TECHLITE PEN NEEDLES) 32G X 4 MM MISC Use to inject Victoza and Lantus. 2 pen needles per day. 05/09/22  Yes McClung, Marzella Schlein, PA-C  naproxen (NAPROSYN) 375 MG tablet Take 1 tablet (375 mg total) by mouth 2 (two) times daily as needed. 06/18/23  Yes Cathlyn Parsons, NP  Semaglutide, 1 MG/DOSE, 4 MG/3ML SOPN Inject 1 mg as directed once a week. Start after 4 weeks of the 0.5mg  dose. 06/15/22  Yes Hoy Register, MD  Semaglutide,0.25 or 0.5MG /DOS, (OZEMPIC, 0.25 OR 0.5 MG/DOSE,) 2 MG/3ML SOPN Inject 0.5 mg into the skin once a week. For 4 weeks. 06/15/22  Yes Hoy Register, MD  sildenafil (VIAGRA) 50 MG tablet Take 1 tablet (50 mg total) by mouth daily as needed for erectile dysfunction (at least 24 hours between doses). 05/09/22  Yes McClung, Marzella Schlein, PA-C  tiZANidine (ZANAFLEX) 4 MG tablet Take 1 tablet (4 mg total) by mouth every 8 (eight) hours as needed for muscle spasms. 06/18/23  Yes Cathlyn Parsons, NP  triamcinolone (KENALOG) 0.025 % ointment Apply 1 application topically 2 (two) times daily as needed. SKIN RASH 09/26/21  Yes Claiborne Rigg, NP  TRUEplus Lancets 28G MISC Use daily before meals 09/26/21  Yes Claiborne Rigg, NP  atorvastatin (LIPITOR) 20 MG tablet Take 1 tablet (20 mg total) by mouth daily. FOR CHOLESTEROL 05/09/22 07/11/23  Anders Simmonds, PA-C  cetirizine (ZYRTEC) 10 MG tablet Take 1 tablet (10 mg total) by mouth daily. 01/09/21 04/09/21  Claiborne Rigg, NP  FLUoxetine (PROZAC) 20 MG tablet Take 2 tablets (40 mg total) by mouth daily. FOR ANXIETY AND DEPRESSION Patient not taking: Reported on 06/18/2023 05/09/22   Anders Simmonds, PA-C  hydrochlorothiazide (HYDRODIURIL) 25 MG tablet Take 1 tablet (25 mg total) by mouth daily. FOR HIGH BLOOD PRESSURE 05/09/22 07/11/23  Anders Simmonds, PA-C  lisinopril (ZESTRIL) 10 MG tablet Take 1 tablet (10 mg total) by mouth daily. FOR HIGH BLOOD PRESSURE 05/09/22 07/11/23  Anders Simmonds, PA-C    Family History Family History  Problem Relation Age of Onset   Diabetes Mother    Diabetes Father    Hypertension Sister     Social History Social History   Tobacco Use   Smoking status: Never   Smokeless tobacco: Never  Vaping Use   Vaping status: Never Used  Substance Use Topics   Alcohol use: Not Currently    Alcohol/week: 1.0 - 2.0 standard drink of alcohol    Types: 1 - 2 Cans of beer per week   Drug use: No    Comment: last time 1989     Allergies   Metformin and related   Review of Systems Review  of Systems  Constitutional:  Positive for fatigue. Negative for chills and fever.  HENT:  Positive for congestion, rhinorrhea, sinus pressure, sinus pain and sneezing. Negative for ear pain, facial swelling, nosebleeds and sore throat.   Eyes:  Positive for discharge and itching.  Respiratory:  Positive for cough. Negative for shortness of breath and wheezing.   Gastrointestinal:  Positive for diarrhea. Negative for nausea and vomiting.  Neurological:  Negative for dizziness, light-headedness and headaches.     Physical Exam Triage Vital Signs ED Triage Vitals  Encounter Vitals Group     BP 12/31/23 1218 (!) 141/78     Systolic BP Percentile --      Diastolic BP Percentile --      Pulse Rate 12/31/23 1218 77     Resp 12/31/23 1218 16     Temp 12/31/23 1218 97.8 F (36.6 C)     Temp Source 12/31/23 1218 Oral     SpO2 12/31/23 1218 94 %     Weight 12/31/23 1217 262 lb (118.8 kg)     Height 12/31/23 1217 5\' 9"  (1.753 m)     Head Circumference --      Peak Flow --      Pain Score 12/31/23 1217 0     Pain Loc --      Pain Education --      Exclude from Growth Chart --    No data found.  Updated Vital Signs BP (!) 141/78 (BP Location: Left Arm)   Pulse 77   Temp 97.8 F (36.6 C) (Oral)    Resp 16   Ht 5\' 9"  (1.753 m)   Wt 262 lb (118.8 kg)   SpO2 94%   BMI 38.69 kg/m   Visual Acuity Right Eye Distance:   Left Eye Distance:   Bilateral Distance:    Right Eye Near:   Left Eye Near:    Bilateral Near:     Physical Exam Vitals reviewed.  Constitutional:      General: He is awake.     Appearance: Normal appearance. He is well-developed and well-groomed.  HENT:     Head: Normocephalic and atraumatic.     Right Ear: Hearing, tympanic membrane and ear canal normal.     Left Ear: Hearing, tympanic membrane and ear canal normal.     Mouth/Throat:     Lips: Pink.     Mouth: Mucous membranes are moist.     Pharynx: Oropharynx is clear. Uvula midline. No pharyngeal swelling, oropharyngeal exudate, posterior oropharyngeal erythema, uvula swelling or postnasal drip.     Tonsils: No tonsillar exudate or tonsillar abscesses.  Eyes:     General: Lids are normal. Gaze aligned appropriately.     Extraocular Movements: Extraocular movements intact.  Cardiovascular:     Rate and Rhythm: Normal rate and regular rhythm.     Heart sounds: Normal heart sounds.  Pulmonary:     Effort: Pulmonary effort is normal.     Breath sounds: Normal breath sounds. No decreased air movement. No decreased breath sounds, wheezing, rhonchi or rales.  Musculoskeletal:     Cervical back: Normal range of motion and neck supple.  Lymphadenopathy:     Head:     Right side of head: No submental, submandibular or preauricular adenopathy.     Left side of head: No submental, submandibular or preauricular adenopathy.     Cervical:     Right cervical: No superficial cervical adenopathy.    Left cervical: No superficial cervical  adenopathy.     Upper Body:     Right upper body: No supraclavicular adenopathy.     Left upper body: No supraclavicular adenopathy.  Skin:    General: Skin is warm and dry.  Neurological:     General: No focal deficit present.     Mental Status: He is alert and oriented to  person, place, and time.  Psychiatric:        Mood and Affect: Mood normal.        Behavior: Behavior normal. Behavior is cooperative.        Thought Content: Thought content normal.        Judgment: Judgment normal.      UC Treatments / Results  Labs (all labs ordered are listed, but only abnormal results are displayed) Labs Reviewed - No data to display  EKG   Radiology No results found.  Procedures Procedures (including critical care time)  Medications Ordered in UC Medications - No data to display  Initial Impression / Assessment and Plan / UC Course  I have reviewed the triage vital signs and the nursing notes.  Pertinent labs & imaging results that were available during my care of the patient were reviewed by me and considered in my medical decision making (see chart for details).      Final Clinical Impressions(s) / UC Diagnoses   Final diagnoses:  Allergic rhinitis, unspecified seasonality, unspecified trigger  Nasal congestion  Seasonal allergies   Patient presents today with concerns for nasal congestion, sneezing, coughing for the past 3 days.  He denies recent sick contacts.  He reports taking Tylenol and Mucinex as needed but denies consistent relief.  Physical exam and vitals are overall reassuring.  At this time suspect likely allergic rhinitis and recommend symptomatic management with over-the-counter medications.  Reviewed importance of starting second-generation antihistamine and Flonase to assist with symptoms as well as consistency and administration over the next few weeks for optimal therapeutic effect.  ED and return precautions were reviewed and provided in after visit summary.  Follow-up as needed.    Discharge Instructions      Based on your symptoms today I believe that you likely have allergies I have attached some information for you to review to help reduce your contact with triggers and reduce symptom burden I recommend the following  at this time:  Start taking a second generation antihistamine such as Allegra, Claritin, Zyrtec, Xyzal, etc (the generics of these medications are typically just as effective and less expensive). If you are having trouble finding them, ask the pharmacist to point them out for you.  I also recommend using Flonase nasal spray to further assist with nasal congestion and runny nose. For your eye symptoms you can use an antihistamine eyedrop such as ketotifen or olopatadine.  I recommend 1 drop in each eye twice per day to assist with symptoms. If desired you can use lubricating eyedrops such as blink tears or refresh eyedrops to help with gritty sensation or dry eyes. I also recommend watching the air-quality forecast and take note of high pollen days to help reduce your symptoms If you think other environmental allergens are causing your symptoms (such as mold, pet dander, fragrances, detergents, etc.) please try to identify them and remove them from your daily life.  This can mean cleaning your home, bedding, clothing and other aspects of your environment a bit more often or with different products that are appropriate for allergies.   If you feel your symptoms  are getting worse, you start to develop fevers and not responding to medications, difficulty breathing, shortness of breath or chest pain please go to the emergency room as these could be signs of medical emergency.      ED Prescriptions   None    PDMP not reviewed this encounter.   Rodderick Holtzer E, PA-C 12/31/23 2038

## 2024-04-17 NOTE — Procedures (Signed)
Mask fit

## 2024-04-29 ENCOUNTER — Other Ambulatory Visit: Payer: Self-pay | Admitting: Emergency Medicine

## 2024-04-29 ENCOUNTER — Other Ambulatory Visit: Payer: Self-pay | Admitting: Family Medicine

## 2024-04-29 ENCOUNTER — Ambulatory Visit: Payer: Self-pay

## 2024-04-29 ENCOUNTER — Other Ambulatory Visit: Payer: Self-pay | Admitting: Physician Assistant

## 2024-04-29 ENCOUNTER — Other Ambulatory Visit: Payer: Self-pay

## 2024-04-29 DIAGNOSIS — E1142 Type 2 diabetes mellitus with diabetic polyneuropathy: Secondary | ICD-10-CM

## 2024-04-29 DIAGNOSIS — I10 Essential (primary) hypertension: Secondary | ICD-10-CM

## 2024-04-29 DIAGNOSIS — E1169 Type 2 diabetes mellitus with other specified complication: Secondary | ICD-10-CM

## 2024-04-29 DIAGNOSIS — N529 Male erectile dysfunction, unspecified: Secondary | ICD-10-CM

## 2024-04-29 DIAGNOSIS — F32A Depression, unspecified: Secondary | ICD-10-CM

## 2024-04-29 DIAGNOSIS — G8929 Other chronic pain: Secondary | ICD-10-CM

## 2024-04-29 DIAGNOSIS — F419 Anxiety disorder, unspecified: Secondary | ICD-10-CM

## 2024-04-29 MED ORDER — DICLOFENAC SODIUM 75 MG PO TBEC
75.0000 mg | DELAYED_RELEASE_TABLET | Freq: Two times a day (BID) | ORAL | 2 refills | Status: DC | PRN
  Filled 2024-04-29 (×2): qty 60, 30d supply, fill #0

## 2024-04-29 NOTE — Telephone Encounter (Signed)
 FYI Only or Action Required?: FYI only for provider.  Patient was last seen in primary care on 05/09/2022 by Danton Jon HERO, PA-C.  Called Nurse Triage reporting Leg Swelling.  Symptoms began ongoing for a long time and worsening.  Interventions attempted: Nothing.  Symptoms are: unchanged.  Triage Disposition: See Physician Within 24 Hours  Patient/caregiver understands and will follow disposition?: Yes   Copied from CRM #[id number]. Topic: Clinical - Red Word Triage >> Apr 29, 2024  1:18 PM Precious C wrote: [facility] that prompted transfer to Nurse Triage: SWELLING  Pt called to set appt, provider is booked up and needs to be seen or accessed asap..the patient has swelling in legs due to working. Reason for Disposition  [1] MODERATE leg swelling (e.g., swelling extends up to knees) AND [2] new-onset or getting worse    Scheduled pt with urgent care on 04/30/2024  Answer Assessment - Initial Assessment Questions 1. ONSET: When did the swelling start? (e.g., minutes, hours, days)     X over month 2. LOCATION: What part of the leg is swollen?  Are both legs swollen or just one leg?     Bilateral legs from knees down to ankle 3. SEVERITY: How bad is the swelling? (e.g., localized; mild, moderate, severe)     severe 4. REDNESS: Is there redness or signs of infection?     Redness and warmth 5. PAIN: Is the swelling painful to touch? If Yes, ask: How painful is it?   (Scale 1-10; mild, moderate or severe)     7/10 6. FEVER: Do you have a fever? If Yes, ask: What is it, how was it measured, and when did it start?      no 7. CAUSE: What do you think is causing the leg swelling?     unknown 8. MEDICAL HISTORY: Do you have a history of blood clots (e.g., DVT), cancer, heart failure, kidney disease, or liver failure?     no 9. RECURRENT SYMPTOM: Have you had leg swelling before? If Yes, ask: When was the last time? What happened that time?     Yes and last  time blood clot 10. OTHER SYMPTOMS: Do you have any other symptoms? (e.g., chest pain, difficulty breathing)       no 11. PREGNANCY: Is there any chance you are pregnant? When was your last menstrual period?       na  Protocols used: Leg Swelling and Edema-A-AH

## 2024-04-30 ENCOUNTER — Ambulatory Visit (HOSPITAL_COMMUNITY)
Admission: EM | Admit: 2024-04-30 | Discharge: 2024-04-30 | Disposition: A | Discharge status: Home / Self Care | Attending: Family Medicine | Admitting: Family Medicine

## 2024-04-30 ENCOUNTER — Encounter (HOSPITAL_COMMUNITY): Payer: Self-pay

## 2024-04-30 ENCOUNTER — Other Ambulatory Visit: Payer: Self-pay

## 2024-04-30 ENCOUNTER — Ambulatory Visit (HOSPITAL_COMMUNITY): Payer: Self-pay

## 2024-04-30 DIAGNOSIS — R600 Localized edema: Secondary | ICD-10-CM | POA: Insufficient documentation

## 2024-04-30 DIAGNOSIS — I10 Essential (primary) hypertension: Secondary | ICD-10-CM | POA: Insufficient documentation

## 2024-04-30 LAB — COMPREHENSIVE METABOLIC PANEL WITH GFR
ALT: 36 U/L (ref 0–44)
AST: 17 U/L (ref 15–41)
Albumin: 3.1 g/dL — ABNORMAL LOW (ref 3.5–5.0)
Alkaline Phosphatase: 42 U/L (ref 38–126)
Anion gap: 11 (ref 5–15)
BUN: 16 mg/dL (ref 6–20)
CO2: 23 mmol/L (ref 22–32)
Calcium: 8.6 mg/dL — ABNORMAL LOW (ref 8.9–10.3)
Chloride: 98 mmol/L (ref 98–111)
Creatinine, Ser: 1.2 mg/dL (ref 0.61–1.24)
GFR, Estimated: >60 mL/min (ref >60)
Glucose, Bld: 242 mg/dL — ABNORMAL HIGH (ref 70–99)
Potassium: 4.3 mmol/L (ref 3.5–5.1)
Sodium: 132 mmol/L — ABNORMAL LOW (ref 135–145)
Total Bilirubin: 0.9 mg/dL (ref 0.0–1.2)
Total Protein: 5.5 g/dL — ABNORMAL LOW (ref 6.5–8.1)

## 2024-04-30 LAB — CBC
HCT: 50.3 % (ref 39.0–52.0)
Hemoglobin: 18.2 g/dL — ABNORMAL HIGH (ref 13.0–17.0)
MCH: 29.5 pg (ref 26.0–34.0)
MCHC: 36.2 g/dL — ABNORMAL HIGH (ref 30.0–36.0)
MCV: 81.5 fL (ref 80.0–100.0)
Platelets: 180 K/uL (ref 150–400)
RBC: 6.17 MIL/uL — ABNORMAL HIGH (ref 4.22–5.81)
RDW: 12.8 % (ref 11.5–15.5)
WBC: 14.9 K/uL — ABNORMAL HIGH (ref 4.0–10.5)
nRBC: 0 % (ref 0.0–0.2)

## 2024-04-30 LAB — TSH: TSH: 1.319 u[IU]/mL (ref 0.350–4.500)

## 2024-04-30 LAB — BRAIN NATRIURETIC PEPTIDE: B Natriuretic Peptide: 5.3 pg/mL (ref 0.0–100.0)

## 2024-04-30 MED ORDER — FUROSEMIDE 20 MG PO TABS
20.0000 mg | ORAL_TABLET | Freq: Every day | ORAL | 0 refills | Status: DC | PRN
  Filled 2024-04-30: qty 20, 10d supply, fill #0

## 2024-04-30 MED ORDER — LISINOPRIL 10 MG PO TABS
10.0000 mg | ORAL_TABLET | Freq: Every day | ORAL | 0 refills | Status: DC
  Filled 2024-04-30: qty 90, 90d supply, fill #0

## 2024-04-30 MED ORDER — HYDROCHLOROTHIAZIDE 25 MG PO TABS
25.0000 mg | ORAL_TABLET | Freq: Every day | ORAL | 0 refills | Status: DC
  Filled 2024-04-30: qty 90, 90d supply, fill #0

## 2024-04-30 NOTE — ED Notes (Signed)
 Patient left before collecting a urine specimen.

## 2024-04-30 NOTE — ED Provider Notes (Signed)
 [facility]   [id number] 04/30/24 Arrival Time: 1046  ASSESSMENT & PLAN:  1. Bilateral lower extremity edema   2. Primary hypertension   3. Essential hypertension    Refilled HTN medications. Trial of Lasix .  Labs Pending:  CBC  COMPREHENSIVE METABOLIC PANEL WITH GFR  BRAIN NATRIURETIC PEPTIDE  TSH    Meds ordered this encounter  Medications   hydrochlorothiazide  (HYDRODIURIL ) 25 MG tablet    Sig: Take 1 tablet (25 mg total) by mouth daily. FOR HIGH BLOOD PRESSURE    Dispense:  90 tablet    Refill:  0   lisinopril  (ZESTRIL ) 10 MG tablet    Sig: Take 1 tablet (10 mg total) by mouth daily. FOR HIGH BLOOD PRESSURE    Dispense:  90 tablet    Refill:  0   furosemide  (LASIX ) 20 MG tablet    Sig: Take 1-2 tablets (20-40 mg total) by mouth daily in the morning as needed for leg swelling    Dispense:  20 tablet    Refill:  0    Follow-up Information     Schedule an appointment as soon as possible for a visit  with Theotis Haze ORN, NP.   Specialty: Nurse Practitioner Why: For follow up. Contact information: [address] [place] [address] [phone]                 Reviewed expectations re: course of current medical issues. Questions answered. Outlined signs and symptoms indicating need for more acute intervention. Patient verbalized understanding. After Visit Summary given.   SUBJECTIVE:  History from: patient. David Dunlap is a 53 y.o. male who presents with complaint of bilateral LE edema; x 1 month. Denies SOB/CP/wt gain. Needs BP med refills.  Social History   Tobacco Use  Smoking Status Never  Smokeless Tobacco Never   Social History   Substance and Sexual Activity  Alcohol Use Yes   Alcohol/week: 1.0 - 2.0 standard drink of alcohol   Types: 1 - 2 Cans of beer per week     OBJECTIVE:  Vitals:   04/30/24 1106  BP: 102/65  Pulse: 93  Resp: 16  Temp: 98.4 F (36.9 C)  TempSrc: Oral  SpO2: 96%     General appearance: alert, oriented, no acute distress Eyes: PERRLA; EOMI; conjunctivae normal HENT: normocephalic; atraumatic Neck: supple with FROM Lungs: without labored respirations; speaks full sentences without difficulty; CTAB Heart: regular rate and rhythm without murmer Extremities: with 2+ pitting edema; mild venous stasis skin changes Skin: warm and dry; without rash or lesions Neuro: normal gait Psychological: alert and cooperative; normal mood and affect   Allergies  Allergen Reactions   Metformin  And Related Other (See Comments)    GI intolerance    Past Medical History:  Diagnosis Date   Arthritis    Depression    Diabetes mellitus without complication (HCC) 05/2016   NEW ONSET    WITH FAMILY HISTORY   DVT (deep venous thrombosis) (HCC)    Essential hypertension    Gallstones 05/2016   GERD (gastroesophageal reflux disease)    Sickle cell anemia (HCC)    Sickle trait   Sleep apnea    Cpap   Social History   Socioeconomic History   Marital status: Single    Spouse name: Not on file   Number of children: 5   Years of education: Not on file   Highest education level: Not on file  Occupational History   Not on  file  Tobacco Use   Smoking status: Never   Smokeless tobacco: Never  Vaping Use   Vaping status: Never Used  Substance and Sexual Activity   Alcohol use: Yes    Alcohol/week: 1.0 - 2.0 standard drink of alcohol    Types: 1 - 2 Cans of beer per week   Drug use: No    Comment: last time 1989   Sexual activity: Not on file  Other Topics Concern   Not on file  Social History Narrative   Not on file   Social Drivers of Health   Financial Resource Strain: Not on file  Food Insecurity: Not on file  Transportation Needs: Not on file  Physical Activity: Not on file  Stress: Not on file  Social Connections: Not on file  Intimate Partner Violence: Not on file   Family History  Problem Relation Age of Onset   Diabetes Mother    Diabetes  Father    Hypertension Sister    Past Surgical History:  Procedure Laterality Date   CHOLECYSTECTOMY N/A 06/07/2016   Procedure: LAPAROSCOPIC CHOLECYSTECTOMY WITH ATTEMPTED INTRAOPERATIVE CHOLANGIOGRAM;  Surgeon: Dann Hummer, MD;  Location: [facility];  Service: General;  Laterality: N/A;   LIGAMENT REPAIR     R forearm   TOTAL KNEE ARTHROPLASTY Right 04/17/2017   Procedure: RIGHT TOTAL KNEE ARTHROPLASTY;  Surgeon: Jerri Kay HERO, MD;  Location: [facility];  Service: Orthopedics;  Laterality: Right;   TOTAL KNEE ARTHROPLASTY Left 04/28/2018   Procedure: LEFT TOTAL KNEE ARTHROPLASTY;  Surgeon: Jerri Kay HERO, MD;  Location: [facility];  Service: Orthopedics;  Laterality: Left;      Rolinda Rogue, MD 04/30/24 [phone]

## 2024-04-30 NOTE — Discharge Instructions (Signed)
 Swelling happens when fluid collects in small spaces around tissues and organs inside the body. Another word for swelling is edema. Some common parts of the body where people can have swelling are the lower legs or hands. This typically is worse in the areas of the body that are closest to the ground (because of gravity)  Symptoms of swelling can include puffiness of the skin, which can cause the skin to look stretched and shiny. This often occurs with swelling in the lower legs and can be worse after you sit or stand for a long time.  Treatment of edema includes several components: treatment of the underlying cause (if possible), reducing the amount of salt (sodium) in your diet, and, in many cases, use of a medication called a diuretic to eliminate excess fluid. Using compression stockings and elevating the legs may also be recommended.   You have had labs (blood and urine testing) today. We will call you with any significant abnormalities or if there is need to begin or change treatment or pursue further follow up.  You may also review your test results online through MyChart. If you do not have a MyChart account, instructions to sign up should be on your discharge paperwork.

## 2024-04-30 NOTE — ED Triage Notes (Addendum)
 Patient reports bilateral leg swelling from the the knee to the foot x 1 month. Patient denies any SOB.  Patient states he has a few BP meds left and is requesting a refill

## 2024-05-01 ENCOUNTER — Ambulatory Visit: Payer: Self-pay

## 2024-05-11 ENCOUNTER — Emergency Department (HOSPITAL_COMMUNITY)

## 2024-05-11 ENCOUNTER — Encounter (HOSPITAL_COMMUNITY): Payer: Self-pay

## 2024-05-11 ENCOUNTER — Emergency Department (HOSPITAL_COMMUNITY)
Admission: EM | Admit: 2024-05-11 | Discharge: 2024-05-11 | Disposition: A | Discharge status: Home / Self Care | Attending: Emergency Medicine | Admitting: Emergency Medicine

## 2024-05-11 ENCOUNTER — Other Ambulatory Visit: Payer: Self-pay

## 2024-05-11 DIAGNOSIS — Z79899 Other long term (current) drug therapy: Secondary | ICD-10-CM | POA: Insufficient documentation

## 2024-05-11 DIAGNOSIS — E119 Type 2 diabetes mellitus without complications: Secondary | ICD-10-CM | POA: Insufficient documentation

## 2024-05-11 DIAGNOSIS — Z794 Long term (current) use of insulin: Secondary | ICD-10-CM | POA: Diagnosis not present

## 2024-05-11 DIAGNOSIS — Z7984 Long term (current) use of oral hypoglycemic drugs: Secondary | ICD-10-CM | POA: Diagnosis not present

## 2024-05-11 DIAGNOSIS — R519 Headache, unspecified: Secondary | ICD-10-CM | POA: Diagnosis present

## 2024-05-11 DIAGNOSIS — I10 Essential (primary) hypertension: Secondary | ICD-10-CM | POA: Insufficient documentation

## 2024-05-11 DIAGNOSIS — Z7901 Long term (current) use of anticoagulants: Secondary | ICD-10-CM | POA: Diagnosis not present

## 2024-05-11 DIAGNOSIS — B349 Viral infection, unspecified: Secondary | ICD-10-CM | POA: Insufficient documentation

## 2024-05-11 LAB — RESP PANEL BY RT-PCR (RSV, FLU A&B, COVID)  RVPGX2
Influenza A by PCR: NEGATIVE
Influenza B by PCR: NEGATIVE
Resp Syncytial Virus by PCR: NEGATIVE
SARS Coronavirus 2 by RT PCR: NEGATIVE

## 2024-05-11 MED ORDER — KETOROLAC TROMETHAMINE 15 MG/ML IJ SOLN
15.0000 mg | Freq: Once | INTRAMUSCULAR | Status: AC
  Administered 2024-05-11: 15 mg via INTRAVENOUS
  Filled 2024-05-11: qty 1

## 2024-05-11 MED ORDER — ACETAMINOPHEN 325 MG PO TABS
650.0000 mg | ORAL_TABLET | Freq: Once | ORAL | Status: AC | PRN
  Administered 2024-05-11: 650 mg via ORAL
  Filled 2024-05-11: qty 2

## 2024-05-11 MED ORDER — MAGNESIUM SULFATE IN D5W 1-5 GM/100ML-% IV SOLN
1.0000 g | Freq: Once | INTRAVENOUS | Status: AC
  Administered 2024-05-11: 1 g via INTRAVENOUS
  Filled 2024-05-11: qty 100

## 2024-05-11 MED ORDER — METOCLOPRAMIDE HCL 5 MG/ML IJ SOLN
10.0000 mg | Freq: Once | INTRAMUSCULAR | Status: AC
  Administered 2024-05-11: 10 mg via INTRAVENOUS
  Filled 2024-05-11: qty 2

## 2024-05-11 NOTE — ED Provider Notes (Signed)
 [place] EMERGENCY DEPARTMENT AT [facility] Provider Note   CSN: [id number] Arrival date & time: 05/11/24  9775     Patient presents with: Headache   David Dunlap is a 53 y.o. male past medical history significant for hypertension, diabetes, and GERD presents today for headache and fever since Friday.  Patient took Tylenol  at home around 4 PM.  Patient found to be febrile and tachycardic upon ED arrival.  Patient also endorses fatigue.  Patient denies diplopia, photophobia, phonophobia, numbness, weakness, nausea, vomiting, cough, congestion, chest pain, shortness of breath, diarrhea, abdominal pain, any other complaints at this time.    Headache Associated symptoms: fever        Prior to Admission medications   Medication Sig Start Date End Date Taking? Authorizing Provider  APIXABAN  (ELIQUIS ) VTE STARTER PACK (10MG  AND 5MG ) Take as directed on package: start with two-5mg  tablets twice daily for 7 days. On day 8, switch to one-5mg  tablet twice daily. Patient not taking: Reported on 04/30/2024 03/08/23   Cottie Donnice PARAS, MD  APIXABAN  (ELIQUIS ) VTE STARTER PACK (10MG  AND 5MG ) Take as directed on package: start with two-5mg  tablets twice daily for 7 days. On day 8, switch to one-5mg  tablet twice daily. Patient not taking: Reported on 04/30/2024 03/08/23   Roemhildt, Lorin T, PA-C  atorvastatin  (LIPITOR) 20 MG tablet Take 1 tablet (20 mg total) by mouth daily. FOR CHOLESTEROL 05/09/22 07/11/23  Danton Jon HERO, PA-C  Blood Glucose Monitoring Suppl (TRUE METRIX METER) DEVI 1 each by Does not apply route 3 (three) times daily before meals. 07/25/16   Newlin, Enobong, MD  cetirizine  (ZYRTEC ) 10 MG tablet Take 1 tablet (10 mg total) by mouth daily. 01/09/21 04/09/21  Fleming, Zelda W, NP  diclofenac  (VOLTAREN ) 75 MG EC tablet Take 1 tablet (75 mg total) by mouth 2 (two) times daily as needed. Patient not taking: Reported on 04/30/2024 04/29/24   Jerri Kay HERO, MD  famotidine   (PEPCID ) 20 MG tablet Take 1 tablet (20 mg total) by mouth daily. Patient not taking: Reported on 04/30/2024 12/03/22   Rising, Asberry, PA-C  FLUoxetine  (PROZAC ) 20 MG tablet Take 2 tablets (40 mg total) by mouth daily. FOR ANXIETY AND DEPRESSION Patient not taking: Reported on 06/18/2023 05/09/22   Danton Jon HERO, PA-C  furosemide  (LASIX ) 20 MG tablet Take 1-2 tablets (20-40 mg total) by mouth daily in the morning as needed for leg swelling 04/30/24   Rolinda Rogue, MD  gabapentin  (NEURONTIN ) 300 MG capsule Take 1 capsule (300 mg total) by mouth 3 (three) times daily. FOR NEUROPATHY 05/09/22   Danton Jon HERO, PA-C  glipiZIDE  (GLUCOTROL ) 10 MG tablet Take 1 tablet (10 mg total) by mouth 2 (two) times daily before a meal. FOR DIABETES 05/09/22   Danton Jon HERO, PA-C  glucose blood (TRUE METRIX BLOOD GLUCOSE TEST) test strip Used daily before meals 09/26/21   Fleming, Zelda W, NP  hydrochlorothiazide  (HYDRODIURIL ) 25 MG tablet Take 1 tablet (25 mg total) by mouth daily. FOR HIGH BLOOD PRESSURE 04/30/24 07/29/24  Rolinda Rogue, MD  hydrOXYzine  (ATARAX ) 25 MG tablet Take 1 tablet (25 mg total) by mouth every 8 (eight) hours as needed. FOR ANXIETY Patient not taking: Reported on 04/30/2024 05/09/22   Danton Jon HERO, PA-C  Insulin  Glargine (BASAGLAR  [facility]) 100 UNIT/ML Inject 10 Units into the skin daily. 06/15/22   Newlin, Enobong, MD  Insulin  Pen Needle (TECHLITE PEN NEEDLES) 32G X 4 MM MISC Use to inject Victoza  and Lantus . 2  pen needles per day. 05/09/22   Danton Jon HERO, PA-C  lisinopril  (ZESTRIL ) 10 MG tablet Take 1 tablet (10 mg total) by mouth daily. FOR HIGH BLOOD PRESSURE 04/30/24 07/29/24  Rolinda Rogue, MD  naproxen  (NAPROSYN ) 375 MG tablet Take 1 tablet (375 mg total) by mouth 2 (two) times daily as needed. Patient not taking: Reported on 04/30/2024 06/18/23   Richad Jon HERO, NP  Semaglutide , 1 MG/DOSE, 4 MG/3ML SOPN Inject 1 mg as directed once a week. Start after 4 weeks of the 0.5mg  dose.  06/15/22   Newlin, Enobong, MD  Semaglutide ,0.25 or 0.5MG /DOS, (OZEMPIC , 0.25 OR 0.5 MG/DOSE,) 2 MG/3ML SOPN Inject 0.5 mg into the skin once a week. For 4 weeks. 06/15/22   Newlin, Enobong, MD  sildenafil  (VIAGRA ) 50 MG tablet Take 1 tablet (50 mg total) by mouth daily as needed for erectile dysfunction (at least 24 hours between doses). 05/09/22   Danton Jon HERO, PA-C  tiZANidine  (ZANAFLEX ) 4 MG tablet Take 1 tablet (4 mg total) by mouth every 8 (eight) hours as needed for muscle spasms. 06/18/23   Kabbe, Angela M, NP  triamcinolone  (KENALOG ) 0.025 % ointment Apply 1 application topically 2 (two) times daily as needed. SKIN RASH 09/26/21   Fleming, Zelda W, NP  TRUEplus Lancets 28G MISC Use daily before meals 09/26/21   Fleming, Zelda W, NP    Allergies: Metformin  and related    Review of Systems  Constitutional:  Positive for fever.  Neurological:  Positive for headaches.    Updated Vital Signs BP 120/74   Pulse 87   Temp (!) 100.4 F (38 C) (Oral)   Resp 18   Ht 5' 9 (1.753 m)   Wt 119.3 kg   SpO2 96%   BMI 38.84 kg/m   Physical Exam Vitals and nursing note reviewed.  Constitutional:      General: He is not in acute distress.    Appearance: He is well-developed. He is not ill-appearing.  HENT:     Head: Normocephalic and atraumatic.     Mouth/Throat:     Mouth: Mucous membranes are moist.  Eyes:     Extraocular Movements: Extraocular movements intact.     Conjunctiva/sclera: Conjunctivae normal.     Pupils: Pupils are equal, round, and reactive to light.  Cardiovascular:     Rate and Rhythm: Normal rate and regular rhythm.     Heart sounds: Normal heart sounds. No murmur heard. Pulmonary:     Effort: Pulmonary effort is normal. No respiratory distress.     Breath sounds: Normal breath sounds.  Abdominal:     Palpations: Abdomen is soft.     Tenderness: There is no abdominal tenderness.  Musculoskeletal:        General: No swelling.     Cervical back: Neck supple.   Skin:    General: Skin is warm and dry.     Capillary Refill: Capillary refill takes less than 2 seconds.  Neurological:     Mental Status: He is alert.     GCS: GCS eye subscore is 4. GCS verbal subscore is 5. GCS motor subscore is 6.     Cranial Nerves: No cranial nerve deficit or dysarthria.  Psychiatric:        Mood and Affect: Mood normal.     (all labs ordered are listed, but only abnormal results are displayed) Labs Reviewed  RESP PANEL BY RT-PCR (RSV, FLU A&B, COVID)  [id number]    EKG: None  Radiology: CT Head  Wo Contrast Result Date: 05/11/2024 CLINICAL DATA:  53 year old male with headache. History of hypertension. EXAM: CT HEAD WITHOUT CONTRAST TECHNIQUE: Contiguous axial images were obtained from the base of the skull through the vertex without intravenous contrast. RADIATION DOSE REDUCTION: This exam was performed according to the departmental dose-optimization program which includes automated exposure control, adjustment of the mA and/or kV according to patient size and/or use of iterative reconstruction technique. COMPARISON:  Head CT 01/30/2018. FINDINGS: Brain: Cerebral volume is stable, within normal limits. No midline shift, ventriculomegaly, mass effect, evidence of mass lesion, intracranial hemorrhage or evidence of cortically based acute infarction. Gray-white matter differentiation is within normal limits throughout the brain. Vascular: No suspicious intracranial vascular hyperdensity. Calcified atherosclerosis at the skull base. Skull: Mild scaphocephaly, normal variant. No acute osseous abnormality identified. Sinuses/Orbits: Visualized paranasal sinuses and mastoids are clear. Other: Visualized orbits and scalp soft tissues are within normal limits. IMPRESSION: Stable and normal for age noncontrast Head CT. Electronically Signed   By: VEAR Hurst M.D.   On: 05/11/2024 05:13     Procedures   Medications Ordered in the ED  magnesium  sulfate IVPB 1 g 100 mL (has no  administration in time range)  acetaminophen  (TYLENOL ) tablet 650 mg (650 mg Oral Given 05/11/24 0238)  metoCLOPramide  (REGLAN ) injection 10 mg (10 mg Intravenous Given 05/11/24 0428)                                    Medical Decision Making Risk OTC drugs.   This patient presents to the ED for concern of headache with fever differential diagnosis includes COVID, flu, RSV, viral URI, viral illness   Lab Tests:  I Ordered, and personally interpreted labs.  The pertinent results include: Respiratory panel negative   Imaging Studies ordered:  I ordered imaging studies including CT head Noncon I independently visualized and interpreted imaging which showed stable normal for age noncontrasted head CT I agree with the radiologist interpretation   Medicines ordered and prescription drug management:  I ordered medication including Tylenol  and migraine cocktail I have reviewed the patients home medicines and have made adjustments as needed   Problem List / ED Course:  Upon reassessment patient states that his headache has improved. Considered for admission or further workup however patient's vital signs, physical exam, labs, and imaging are reassuring.  Patient's symptoms likely due to viral illness.  Patient advised to alternate Tylenol  Motrin every 4 hours for fever and pain.  Patient given return precautions.  I feel patient is safe for discharge at this time.        Final diagnoses:  Nonintractable headache, unspecified chronicity pattern, unspecified headache type  Viral syndrome    ED Discharge Orders     None          Andreu, Drudge, PA-C 05/11/24 0518    Jerral Meth, MD 05/11/24 [id number]

## 2024-05-11 NOTE — Discharge Instructions (Signed)
 Today you were seen for fever and headache.  I suspect you likely have a viral illness.  Please alternate taking Tylenol  and Motrin every 4 hours for fever and pain.  Please return to the ED if you continue to have fever despite taking Tylenol  Motrin, uncontrollable vomiting, or double vision.  Thank you for letting us  treat you today. After reviewing your labs and imaging, I feel you are safe to go home. Please follow up with your PCP in the next several days and provide them with your records from this visit. Return to the Emergency Room if pain becomes severe or symptoms worsen.

## 2024-05-11 NOTE — ED Triage Notes (Signed)
 Pt coming in reporting a headache. Pt reports taking tylonol at home at 4pm. Pt is alert and oriented . Pt reports having a past medical history of diabetes and high blood pressure.

## 2024-05-11 NOTE — ED Notes (Signed)
Patient verbalizes understanding of discharge instructions. Opportunity for questioning and answers were provided. Armband removed by staff, pt discharged from ED. Ambulated out to lobby  

## 2024-06-09 ENCOUNTER — Other Ambulatory Visit: Payer: Self-pay

## 2024-06-09 ENCOUNTER — Encounter: Payer: Self-pay | Admitting: Nurse Practitioner

## 2024-06-09 ENCOUNTER — Ambulatory Visit: Attending: Nurse Practitioner | Admitting: Nurse Practitioner

## 2024-06-09 VITALS — BP 118/80 | HR 68 | Temp 98.4°F | Resp 18 | Ht 69.0 in | Wt 265.0 lb

## 2024-06-09 DIAGNOSIS — E1142 Type 2 diabetes mellitus with diabetic polyneuropathy: Secondary | ICD-10-CM

## 2024-06-09 DIAGNOSIS — E1169 Type 2 diabetes mellitus with other specified complication: Secondary | ICD-10-CM | POA: Diagnosis not present

## 2024-06-09 DIAGNOSIS — J302 Other seasonal allergic rhinitis: Secondary | ICD-10-CM

## 2024-06-09 DIAGNOSIS — D72829 Elevated white blood cell count, unspecified: Secondary | ICD-10-CM | POA: Diagnosis not present

## 2024-06-09 DIAGNOSIS — G4733 Obstructive sleep apnea (adult) (pediatric): Secondary | ICD-10-CM

## 2024-06-09 DIAGNOSIS — I10 Essential (primary) hypertension: Secondary | ICD-10-CM | POA: Diagnosis not present

## 2024-06-09 DIAGNOSIS — Z1211 Encounter for screening for malignant neoplasm of colon: Secondary | ICD-10-CM

## 2024-06-09 DIAGNOSIS — Z139 Encounter for screening, unspecified: Secondary | ICD-10-CM

## 2024-06-09 DIAGNOSIS — Z23 Encounter for immunization: Secondary | ICD-10-CM | POA: Diagnosis not present

## 2024-06-09 DIAGNOSIS — Z7984 Long term (current) use of oral hypoglycemic drugs: Secondary | ICD-10-CM

## 2024-06-09 DIAGNOSIS — N529 Male erectile dysfunction, unspecified: Secondary | ICD-10-CM

## 2024-06-09 DIAGNOSIS — G8929 Other chronic pain: Secondary | ICD-10-CM

## 2024-06-09 DIAGNOSIS — E7800 Pure hypercholesterolemia, unspecified: Secondary | ICD-10-CM

## 2024-06-09 DIAGNOSIS — I82409 Acute embolism and thrombosis of unspecified deep veins of unspecified lower extremity: Secondary | ICD-10-CM

## 2024-06-09 DIAGNOSIS — K219 Gastro-esophageal reflux disease without esophagitis: Secondary | ICD-10-CM

## 2024-06-09 LAB — POCT GLYCOSYLATED HEMOGLOBIN (HGB A1C): Hemoglobin A1C: 7.4 % — AB (ref 4.0–5.6)

## 2024-06-09 MED ORDER — APIXABAN 5 MG PO TABS
5.0000 mg | ORAL_TABLET | Freq: Two times a day (BID) | ORAL | 2 refills | Status: AC
  Filled 2024-06-09: qty 180, 90d supply, fill #0

## 2024-06-09 MED ORDER — LEVOCETIRIZINE DIHYDROCHLORIDE 5 MG PO TABS
5.0000 mg | ORAL_TABLET | Freq: Every evening | ORAL | 1 refills | Status: AC
  Filled 2024-06-09: qty 90, 90d supply, fill #0

## 2024-06-09 MED ORDER — ATORVASTATIN CALCIUM 20 MG PO TABS
20.0000 mg | ORAL_TABLET | Freq: Every day | ORAL | 1 refills | Status: AC
  Filled 2024-06-09: qty 90, 90d supply, fill #0

## 2024-06-09 MED ORDER — EMPAGLIFLOZIN 10 MG PO TABS
10.0000 mg | ORAL_TABLET | Freq: Every day | ORAL | 3 refills | Status: AC
  Filled 2024-06-09: qty 90, 90d supply, fill #0

## 2024-06-09 MED ORDER — TIZANIDINE HCL 4 MG PO TABS
4.0000 mg | ORAL_TABLET | Freq: Three times a day (TID) | ORAL | 0 refills | Status: AC | PRN
  Filled 2024-06-09: qty 15, 5d supply, fill #0

## 2024-06-09 MED ORDER — LISINOPRIL 10 MG PO TABS
10.0000 mg | ORAL_TABLET | Freq: Every day | ORAL | 1 refills | Status: AC
  Filled 2024-06-09: qty 90, 90d supply, fill #0

## 2024-06-09 MED ORDER — GABAPENTIN 300 MG PO CAPS
300.0000 mg | ORAL_CAPSULE | Freq: Three times a day (TID) | ORAL | 5 refills | Status: AC
  Filled 2024-06-09: qty 90, 30d supply, fill #0

## 2024-06-09 MED ORDER — SILDENAFIL CITRATE 100 MG PO TABS
50.0000 mg | ORAL_TABLET | Freq: Every day | ORAL | 11 refills | Status: AC | PRN
  Filled 2024-06-09: qty 5, 5d supply, fill #0

## 2024-06-09 MED ORDER — FUROSEMIDE 20 MG PO TABS
20.0000 mg | ORAL_TABLET | Freq: Every day | ORAL | 0 refills | Status: AC | PRN
  Filled 2024-06-09: qty 20, 10d supply, fill #0

## 2024-06-09 MED ORDER — FAMOTIDINE 40 MG PO TABS
40.0000 mg | ORAL_TABLET | Freq: Every day | ORAL | 1 refills | Status: AC
  Filled 2024-06-09: qty 90, 90d supply, fill #0

## 2024-06-09 MED ORDER — HYDROCHLOROTHIAZIDE 25 MG PO TABS
25.0000 mg | ORAL_TABLET | Freq: Every day | ORAL | 1 refills | Status: AC
  Filled 2024-06-09: qty 90, 90d supply, fill #0

## 2024-06-09 NOTE — Patient Instructions (Signed)
 [facility] at [facility] [address] [place] [address] [place],  [place]  [address] Main: [phone]

## 2024-06-09 NOTE — Progress Notes (Unsigned)
 Assessment & Plan:  [name] was seen today for hospitalization follow-up.  Diagnoses and all orders for this visit:  Type 2 diabetes mellitus with other specified complication, without long-term current use of insulin  (HCC) -     Urine Albumin/Creatinine with ratio (send out) [LAB689] -     Ambulatory referral to Ophthalmology -     atorvastatin  (LIPITOR) 20 MG tablet; Take 1 tablet (20 mg total) by mouth daily. FOR CHOLESTEROL -     POCT glycosylated hemoglobin (Hb A1C) -     empagliflozin  (JARDIANCE ) 10 MG TABS tablet; Take 1 tablet (10 mg total) by mouth daily. For diabetes and kidneys -     CMP14+EGFR  Need for influenza vaccination -     Flu vaccine trivalent PF, 6mos and older(Flulaval,Afluria,Fluarix,Fluzone)  Diabetic polyneuropathy associated with type 2 diabetes mellitus (HCC) -     gabapentin  (NEURONTIN ) 300 MG capsule; Take 1 capsule (300 mg total) by mouth 3 (three) times daily. FOR NEUROPATHY  Colon cancer screening -     Ambulatory referral to Gastroenterology  Need for vaccination against Streptococcus pneumoniae -     Pneumococcal conjugate vaccine 20-valent (PCV20)  Encounter for screening involving social determinants of health (SDoH) -     AMB Referral VBCI Care Management  Primary hypertension -     hydrochlorothiazide  (HYDRODIURIL ) 25 MG tablet; Take 1 tablet (25 mg total) by mouth daily. FOR HIGH BLOOD PRESSURE -     lisinopril  (ZESTRIL ) 10 MG tablet; Take 1 tablet (10 mg total) by mouth daily. FOR HIGH BLOOD PRESSURE -     furosemide  (LASIX ) 20 MG tablet; Take 1-2 tablets (20-40 mg total) by mouth daily in the morning as needed for leg swelling  Chronic pain of both knees -     gabapentin  (NEURONTIN ) 300 MG capsule; Take 1 capsule (300 mg total) by mouth 3 (three) times daily. FOR NEUROPATHY -     tiZANidine  (ZANAFLEX ) 4 MG tablet; Take 1 tablet (4 mg total) by mouth every 8 (eight) hours as needed for muscle spasms.  OSA on CPAP -     Desensitization  mask fit; Future  Leukocytosis, unspecified type -     CBC with Differential  Hypercholesterolemia -     Lipid panel  Erectile disorder -     sildenafil  (VIAGRA ) 100 MG tablet; Take 0.5-1 tablets (50-100 mg total) by mouth daily as needed for erectile dysfunction.  Seasonal allergies -     levocetirizine (XYZAL ) 5 MG tablet; Take 1 tablet (5 mg total) by mouth every evening. For allergies  GERD without esophagitis -     famotidine  (PEPCID ) 40 MG tablet; Take 1 tablet (40 mg total) by mouth daily. FOR GERD/HEARTBURN  Recurrent deep vein thrombosis (DVT) (HCC) -     apixaban  (ELIQUIS ) 5 MG TABS tablet; Take 1 tablet (5 mg total) by mouth 2 (two) times daily.    Patient has been counseled on age-appropriate routine health concerns for screening and prevention. These are reviewed and up-to-date. Referrals have been placed accordingly. Immunizations are up-to-date or declined.    Subjective:   Chief Complaint  Patient presents with   Hospitalization Follow-up    David Dunlap 53 y.o. male presents to office today for HFU  PMH: HTN, DM, GERD  HFU Seen in the ED on 05-11-2024 with headache and fever. Temp 100.4 on arrival.  Given toradol , reglan , tylenol  and magnesium . CT head negative. It was felt his symptoms were viral related. Discharged home at baseline.  HTN Blood pressure controlled with lisinopril  BP Readings from Last 3 Encounters:  06/09/24 118/80  05/11/24 113/74  04/30/24 102/65     DM 2 Not currently taking any diabetes medication. He has been managing his condition by dietary modifications. He previously used Ozempic  but stopped due to discomfort with giving injections. He is not taking metformin . His A1c level was previously high at 10.3 but has improved to 7.4  He experiences swelling in his legs, particularly when standing for long periods at work, and his feet are consistently painful and swollen. He has been prescribed gabapentin  300 mg, which he  takes three times a day for neuropathy, but he has run out of it recently. Lab Results  Component Value Date   HGBA1C 7.4 (A) 06/09/2024      He has a history of deep vein thrombosis (DVT) in one leg and is supposed to be on Eliquis , a blood thinner, but has not been taking it due to cost issues. He experiences some swelling in his ankle today but no pain in the leg previously affected by DVT.  He uses a CPAP machine for sleep apnea but needs a new mask as the current one is losing air.  He is not using it as prescribed.  He has had the CPAP machine for about three to four years, with the current one being an upgrade from his first device.  He has not been taking any medication for anxiety or depression, such as Prozac  or his hydroxyzine .    Review of Systems  Constitutional:  Negative for fever, malaise/fatigue and weight loss.  HENT: Negative.  Negative for nosebleeds.   Eyes: Negative.  Negative for blurred vision, double vision and photophobia.  Respiratory: Negative.  Negative for cough and shortness of breath.   Cardiovascular:  Positive for leg swelling. Negative for chest pain and palpitations.  Gastrointestinal: Negative.  Negative for heartburn, nausea and vomiting.  Genitourinary:        ED  Musculoskeletal:  Positive for joint pain. Negative for myalgias.  Neurological:  Positive for tingling and sensory change. Negative for dizziness, focal weakness, seizures and headaches.  Psychiatric/Behavioral: Negative.  Negative for suicidal ideas.     Past Medical History:  Diagnosis Date   Arthritis    Depression    Diabetes mellitus without complication (HCC) 05/2016   NEW ONSET    WITH FAMILY HISTORY   DVT (deep venous thrombosis) (HCC)    Essential hypertension    Gallstones 05/2016   GERD (gastroesophageal reflux disease)    Sickle cell anemia (HCC)    Sickle trait   Sleep apnea    Cpap    Past Surgical History:  Procedure Laterality Date   CHOLECYSTECTOMY N/A  06/07/2016   Procedure: LAPAROSCOPIC CHOLECYSTECTOMY WITH ATTEMPTED INTRAOPERATIVE CHOLANGIOGRAM;  Surgeon: Dann Hummer, MD;  Location: [facility] OR;  Service: General;  Laterality: N/A;   LIGAMENT REPAIR     R forearm   TOTAL KNEE ARTHROPLASTY Right 04/17/2017   Procedure: RIGHT TOTAL KNEE ARTHROPLASTY;  Surgeon: Jerri Kay HERO, MD;  Location: [facility] OR;  Service: Orthopedics;  Laterality: Right;   TOTAL KNEE ARTHROPLASTY Left 04/28/2018   Procedure: LEFT TOTAL KNEE ARTHROPLASTY;  Surgeon: Jerri Kay HERO, MD;  Location: [facility] OR;  Service: Orthopedics;  Laterality: Left;    Family History  Problem Relation Age of Onset   Diabetes Mother    Diabetes Father    Hypertension Sister     Social History Reviewed with no changes to be  made today.   Outpatient Medications Prior to Visit  Medication Sig Dispense Refill   Blood Glucose Monitoring Suppl (TRUE METRIX METER) DEVI 1 each by Does not apply route 3 (three) times daily before meals. 1 Device 0   glucose blood (TRUE METRIX BLOOD GLUCOSE TEST) test strip Used daily before meals 100 each 12   TRUEplus Lancets 28G MISC Use daily before meals 30 each 12   furosemide  (LASIX ) 20 MG tablet Take 1-2 tablets (20-40 mg total) by mouth daily in the morning as needed for leg swelling 20 tablet 0   hydrochlorothiazide  (HYDRODIURIL ) 25 MG tablet Take 1 tablet (25 mg total) by mouth daily. FOR HIGH BLOOD PRESSURE 90 tablet 0   lisinopril  (ZESTRIL ) 10 MG tablet Take 1 tablet (10 mg total) by mouth daily. FOR HIGH BLOOD PRESSURE 90 tablet 0   glipiZIDE  (GLUCOTROL ) 10 MG tablet Take 1 tablet (10 mg total) by mouth 2 (two) times daily before a meal. FOR DIABETES (Patient not taking: Reported on 06/09/2024) 60 tablet 5   APIXABAN  (ELIQUIS ) VTE STARTER PACK (10MG  AND 5MG ) Take as directed on package: start with two-5mg  tablets twice daily for 7 days. On day 8, switch to one-5mg  tablet twice daily. (Patient not taking: Reported on 06/09/2024) 74 each 0   APIXABAN  (ELIQUIS ) VTE  STARTER PACK (10MG  AND 5MG ) Take as directed on package: start with two-5mg  tablets twice daily for 7 days. On day 8, switch to one-5mg  tablet twice daily. (Patient not taking: Reported on 06/09/2024) 74 each 0   atorvastatin  (LIPITOR) 20 MG tablet Take 1 tablet (20 mg total) by mouth daily. FOR CHOLESTEROL (Patient not taking: Reported on 06/09/2024) 90 tablet 1   cetirizine  (ZYRTEC ) 10 MG tablet Take 1 tablet (10 mg total) by mouth daily. (Patient not taking: Reported on 06/09/2024) 90 tablet 1   diclofenac  (VOLTAREN ) 75 MG EC tablet Take 1 tablet (75 mg total) by mouth 2 (two) times daily as needed. (Patient not taking: Reported on 06/09/2024) 60 tablet 2   famotidine  (PEPCID ) 20 MG tablet Take 1 tablet (20 mg total) by mouth daily. (Patient not taking: Reported on 06/09/2024) 30 tablet 2   FLUoxetine  (PROZAC ) 20 MG tablet Take 2 tablets (40 mg total) by mouth daily. FOR ANXIETY AND DEPRESSION (Patient not taking: Reported on 06/09/2024) 60 tablet 3   gabapentin  (NEURONTIN ) 300 MG capsule Take 1 capsule (300 mg total) by mouth 3 (three) times daily. FOR NEUROPATHY (Patient not taking: Reported on 06/09/2024) 90 capsule 5   hydrOXYzine  (ATARAX ) 25 MG tablet Take 1 tablet (25 mg total) by mouth every 8 (eight) hours as needed. FOR ANXIETY (Patient not taking: Reported on 06/09/2024) 60 tablet 1   Insulin  Glargine (BASAGLAR  KWIKPEN) 100 UNIT/ML Inject 10 Units into the skin daily. (Patient not taking: Reported on 06/09/2024) 15 mL 1   Insulin  Pen Needle (TECHLITE PEN NEEDLES) 32G X 4 MM MISC Use to inject Victoza  and Lantus . 2 pen needles per day. (Patient not taking: Reported on 06/09/2024) 100 each 2   naproxen  (NAPROSYN ) 375 MG tablet Take 1 tablet (375 mg total) by mouth 2 (two) times daily as needed. (Patient not taking: Reported on 06/09/2024) 14 tablet 0   Semaglutide , 1 MG/DOSE, 4 MG/3ML SOPN Inject 1 mg as directed once a week. Start after 4 weeks of the 0.5mg  dose. (Patient not taking: Reported on  06/09/2024) 3 mL 2   Semaglutide ,0.25 or 0.5MG /DOS, (OZEMPIC , 0.25 OR 0.5 MG/DOSE,) 2 MG/3ML SOPN Inject 0.5 mg into the  skin once a week. For 4 weeks. (Patient not taking: Reported on 06/09/2024) 3 mL 0   sildenafil  (VIAGRA ) 50 MG tablet Take 1 tablet (50 mg total) by mouth daily as needed for erectile dysfunction (at least 24 hours between doses). (Patient not taking: Reported on 06/09/2024) 10 tablet 1   tiZANidine  (ZANAFLEX ) 4 MG tablet Take 1 tablet (4 mg total) by mouth every 8 (eight) hours as needed for muscle spasms. (Patient not taking: Reported on 06/09/2024) 15 tablet 0   triamcinolone  (KENALOG ) 0.025 % ointment Apply 1 application topically 2 (two) times daily as needed. SKIN RASH (Patient not taking: Reported on 06/09/2024) 60 g 0   No facility-administered medications prior to visit.    Allergies  Allergen Reactions   Metformin  And Related Other (See Comments)    GI intolerance       Objective:    BP 118/80 (BP Location: Left Arm, Patient Position: Sitting, Cuff Size: Normal)   Pulse 68   Temp 98.4 F (36.9 C) (Oral)   Resp 18   Ht 5' 9 (1.753 m)   Wt 265 lb (120.2 kg)   SpO2 98%   BMI 39.13 kg/m  Wt Readings from Last 3 Encounters:  06/09/24 265 lb (120.2 kg)  05/11/24 263 lb (119.3 kg)  12/31/23 262 lb (118.8 kg)    Physical Exam Vitals and nursing note reviewed.  Constitutional:      Appearance: He is well-developed.  HENT:     Head: Normocephalic and atraumatic.  Cardiovascular:     Rate and Rhythm: Normal rate and regular rhythm.     Heart sounds: Normal heart sounds. No murmur heard.    No friction rub. No gallop.  Pulmonary:     Effort: Pulmonary effort is normal. No tachypnea or respiratory distress.     Breath sounds: Normal breath sounds. No decreased breath sounds, wheezing, rhonchi or rales.  Chest:     Chest wall: No tenderness.  Musculoskeletal:        General: Normal range of motion.     Cervical back: Normal range of motion.  Skin:     General: Skin is warm and dry.  Neurological:     Mental Status: He is alert and oriented to person, place, and time.     Coordination: Coordination normal.  Psychiatric:        Behavior: Behavior normal. Behavior is cooperative.        Thought Content: Thought content normal.        Judgment: Judgment normal.          Patient has been counseled extensively about nutrition and exercise as well as the importance of adherence with medications and regular follow-up. The patient was given clear instructions to go to ER or return to medical center if symptoms don't improve, worsen or new problems develop. The patient verbalized understanding.   Follow-up: Return in about 3 months (around 09/16/2024).   Haze LELON Servant, FNP-BC [facility] [place], [place] [phone]   06/11/2024, 9:49 PM

## 2024-06-09 NOTE — Progress Notes (Unsigned)
 Swelling of legs and top of feet when standing for long periods of time.

## 2024-06-11 ENCOUNTER — Ambulatory Visit: Payer: Self-pay | Admitting: Nurse Practitioner

## 2024-06-11 ENCOUNTER — Encounter: Payer: Self-pay | Admitting: Nurse Practitioner

## 2024-06-11 LAB — CBC WITH DIFFERENTIAL/PLATELET
Basophils Absolute: 0 x10E3/uL (ref 0.0–0.2)
Basos: 0 %
EOS (ABSOLUTE): 0.1 x10E3/uL (ref 0.0–0.4)
Eos: 1 %
Hematocrit: 47.8 % (ref 37.5–51.0)
Hemoglobin: 15.9 g/dL (ref 13.0–17.7)
Immature Grans (Abs): 0.1 x10E3/uL (ref 0.0–0.1)
Immature Granulocytes: 1 %
Lymphocytes Absolute: 2.5 x10E3/uL (ref 0.7–3.1)
Lymphs: 24 %
MCH: 30.1 pg (ref 26.6–33.0)
MCHC: 33.3 g/dL (ref 31.5–35.7)
MCV: 91 fL (ref 79–97)
Monocytes Absolute: 1.1 x10E3/uL — ABNORMAL HIGH (ref 0.1–0.9)
Monocytes: 10 %
Neutrophils Absolute: 6.8 x10E3/uL (ref 1.4–7.0)
Neutrophils: 64 %
Platelets: 189 x10E3/uL (ref 150–450)
RBC: 5.28 x10E6/uL (ref 4.14–5.80)
RDW: 14.5 % (ref 11.6–15.4)
WBC: 10.6 x10E3/uL (ref 3.4–10.8)

## 2024-06-11 LAB — CMP14+EGFR
ALT: 30 IU/L (ref 0–44)
AST: 12 IU/L (ref 0–40)
Albumin: 4.3 g/dL (ref 3.8–4.9)
Alkaline Phosphatase: 66 IU/L (ref 47–123)
BUN/Creatinine Ratio: 13 (ref 9–20)
BUN: 12 mg/dL (ref 6–24)
Bilirubin Total: 0.5 mg/dL (ref 0.0–1.2)
CO2: 24 mmol/L (ref 20–29)
Calcium: 9.4 mg/dL (ref 8.7–10.2)
Chloride: 98 mmol/L (ref 96–106)
Creatinine, Ser: 0.89 mg/dL (ref 0.76–1.27)
Globulin, Total: 2.1 g/dL (ref 1.5–4.5)
Glucose: 165 mg/dL — ABNORMAL HIGH (ref 70–99)
Potassium: 4.8 mmol/L (ref 3.5–5.2)
Sodium: 134 mmol/L (ref 134–144)
Total Protein: 6.4 g/dL (ref 6.0–8.5)
eGFR: 101 mL/min/1.73 (ref >59)

## 2024-06-11 LAB — MICROALBUMIN / CREATININE URINE RATIO
Creatinine, Urine: 124.5 mg/dL
Microalb/Creat Ratio: <2 mg/g{creat} (ref 0–29)
Microalbumin, Urine: <3 ug/mL

## 2024-06-11 LAB — LIPID PANEL
Chol/HDL Ratio: 3.8 ratio (ref 0.0–5.0)
Cholesterol, Total: 199 mg/dL (ref 100–199)
HDL: 53 mg/dL (ref >39)
LDL Chol Calc (NIH): 110 mg/dL — ABNORMAL HIGH (ref 0–99)
Triglycerides: 208 mg/dL — ABNORMAL HIGH (ref 0–149)
VLDL Cholesterol Cal: 36 mg/dL (ref 5–40)

## 2024-06-12 ENCOUNTER — Telehealth: Payer: Self-pay | Admitting: *Deleted

## 2024-06-12 NOTE — Progress Notes (Signed)
 Complex Care Management Note Care Guide Note  06/12/2024 Name: David Dunlap MRN: [id number] DOB: 09/16/1971   Complex Care Management Outreach Attempts: An unsuccessful telephone outreach was attempted today to offer the patient information about available complex care management services.  Follow Up Plan:  Additional outreach attempts will be made to offer the patient complex care management information and services.   Encounter Outcome:  No Answer Patient ins confirmed in [place]   Harlene Satterfield  [facility] Guide  Direct Dial: [phone]  Fax [phone]

## 2024-06-15 NOTE — Progress Notes (Unsigned)
 Complex Care Management Note Care Guide Note  06/15/2024 Name: David Dunlap MRN: [id number] DOB: 03-06-1971   Complex Care Management Outreach Attempts: A second unsuccessful outreach was attempted today to offer the patient with information about available complex care management services.  Follow Up Plan:  Additional outreach attempts will be made to offer the patient complex care management information and services.   Encounter Outcome:  No Answer  Harlene Satterfield  [facility]  [facility], [facility] Guide  Direct Dial: [phone]  Fax [phone]

## 2024-06-16 ENCOUNTER — Other Ambulatory Visit: Payer: Self-pay

## 2024-06-16 NOTE — Progress Notes (Signed)
 Complex Care Management Note Care Guide Note  06/16/2024 Name: AKAI DOLLARD MRN: [id number] DOB: [birth year]   Complex Care Management Outreach Attempts: A third unsuccessful outreach was attempted today to offer the patient with information about available complex care management services.  Follow Up Plan:  No further outreach attempts will be made at this time. We have been unable to contact the patient to offer or enroll patient in complex care management services.  Encounter Outcome:  No Answer  Harlene Satterfield  [facility]  [facility], [facility] Guide  Direct Dial: [phone]  Fax [phone]

## 2024-06-17 ENCOUNTER — Other Ambulatory Visit: Payer: Self-pay

## 2024-07-01 ENCOUNTER — Telehealth: Payer: Self-pay | Admitting: Nurse Practitioner

## 2024-07-01 NOTE — Telephone Encounter (Signed)
 Copied from CRM #[id number]. Topic: Referral - Status >> Jul 01, 2024  8:37 AM Lonell PEDLAR wrote:  Reason for CRM: Thersia calling from [facility] stated they received the referral for patient, however, they are unable to see him due to being OON with patients insurance.

## 2024-07-02 NOTE — Telephone Encounter (Signed)
 Patient identified by name and date of birth.  Contact information given to schedule appointment.

## 2024-07-20 ENCOUNTER — Encounter: Payer: Self-pay | Admitting: Radiology

## 2024-08-03 ENCOUNTER — Ambulatory Visit (HOSPITAL_BASED_OUTPATIENT_CLINIC_OR_DEPARTMENT_OTHER): Attending: Nurse Practitioner

## 2024-09-14 ENCOUNTER — Telehealth: Payer: Self-pay | Admitting: Nurse Practitioner

## 2024-09-14 NOTE — Telephone Encounter (Signed)
 Pt unconfirmed appt mailbox full to leave a vm

## 2024-09-15 ENCOUNTER — Ambulatory Visit: Admitting: Nurse Practitioner
